# Patient Record
Sex: Female | Born: 1955 | Race: White | Hispanic: No | Smoking: Never smoker
Health system: Southern US, Community
[De-identification: ages and names within clinical notes are randomized; demographics above are authoritative.]

## PROBLEM LIST (undated history)

## (undated) DIAGNOSIS — R600 Localized edema: Secondary | ICD-10-CM

## (undated) DIAGNOSIS — T7840XA Allergy, unspecified, initial encounter: Secondary | ICD-10-CM

## (undated) DIAGNOSIS — R002 Palpitations: Secondary | ICD-10-CM

## (undated) DIAGNOSIS — R0602 Shortness of breath: Secondary | ICD-10-CM

## (undated) DIAGNOSIS — E785 Hyperlipidemia, unspecified: Secondary | ICD-10-CM

## (undated) DIAGNOSIS — E119 Type 2 diabetes mellitus without complications: Secondary | ICD-10-CM

## (undated) DIAGNOSIS — I10 Essential (primary) hypertension: Secondary | ICD-10-CM

## (undated) DIAGNOSIS — M549 Dorsalgia, unspecified: Secondary | ICD-10-CM

## (undated) DIAGNOSIS — N200 Calculus of kidney: Secondary | ICD-10-CM

## (undated) DIAGNOSIS — K219 Gastro-esophageal reflux disease without esophagitis: Secondary | ICD-10-CM

## (undated) DIAGNOSIS — J45909 Unspecified asthma, uncomplicated: Secondary | ICD-10-CM

## (undated) DIAGNOSIS — E559 Vitamin D deficiency, unspecified: Secondary | ICD-10-CM

## (undated) HISTORY — DX: Allergy, unspecified, initial encounter: T78.40XA

## (undated) HISTORY — DX: Shortness of breath: R06.02

## (undated) HISTORY — DX: Gastro-esophageal reflux disease without esophagitis: K21.9

## (undated) HISTORY — DX: Type 2 diabetes mellitus without complications: E11.9

## (undated) HISTORY — DX: Unspecified asthma, uncomplicated: J45.909

## (undated) HISTORY — DX: Dorsalgia, unspecified: M54.9

## (undated) HISTORY — DX: Calculus of kidney: N20.0

## (undated) HISTORY — PX: APPENDECTOMY: SHX54

## (undated) HISTORY — PX: COLONOSCOPY: SHX174

## (undated) HISTORY — DX: Hyperlipidemia, unspecified: E78.5

## (undated) HISTORY — PX: TONSILLECTOMY: SUR1361

## (undated) HISTORY — DX: Essential (primary) hypertension: I10

## (undated) HISTORY — DX: Localized edema: R60.0

## (undated) HISTORY — DX: Palpitations: R00.2

## (undated) HISTORY — PX: TUBAL LIGATION: SHX77

## (undated) HISTORY — DX: Vitamin D deficiency, unspecified: E55.9

---

## 1998-01-20 HISTORY — PX: AUGMENTATION MAMMAPLASTY: SUR837

## 1999-01-21 HISTORY — PX: BREAST ENHANCEMENT SURGERY: SHX7

## 2000-05-25 ENCOUNTER — Encounter: Admission: RE | Admit: 2000-05-25 | Discharge: 2000-05-25 | Payer: Self-pay | Admitting: Specialist

## 2000-05-25 ENCOUNTER — Encounter: Payer: Self-pay | Admitting: Specialist

## 2001-11-29 ENCOUNTER — Other Ambulatory Visit: Admission: RE | Admit: 2001-11-29 | Discharge: 2001-11-29 | Payer: Self-pay | Admitting: Family Medicine

## 2002-01-04 ENCOUNTER — Encounter: Admission: RE | Admit: 2002-01-04 | Discharge: 2002-01-04 | Payer: Self-pay | Admitting: Family Medicine

## 2002-01-04 ENCOUNTER — Encounter: Payer: Self-pay | Admitting: Family Medicine

## 2003-05-09 ENCOUNTER — Other Ambulatory Visit: Admission: RE | Admit: 2003-05-09 | Discharge: 2003-05-09 | Payer: Self-pay | Admitting: Family Medicine

## 2003-05-10 ENCOUNTER — Other Ambulatory Visit: Admission: RE | Admit: 2003-05-10 | Discharge: 2003-05-10 | Payer: Self-pay | Admitting: Family Medicine

## 2003-06-09 ENCOUNTER — Encounter: Admission: RE | Admit: 2003-06-09 | Discharge: 2003-06-09 | Payer: Self-pay | Admitting: Family Medicine

## 2003-08-16 ENCOUNTER — Other Ambulatory Visit: Admission: RE | Admit: 2003-08-16 | Discharge: 2003-08-16 | Payer: Self-pay | Admitting: Family Medicine

## 2004-05-13 ENCOUNTER — Ambulatory Visit: Payer: Self-pay | Admitting: Family Medicine

## 2004-05-13 ENCOUNTER — Other Ambulatory Visit: Admission: RE | Admit: 2004-05-13 | Discharge: 2004-05-13 | Payer: Self-pay | Admitting: Family Medicine

## 2004-05-30 ENCOUNTER — Ambulatory Visit: Payer: Self-pay

## 2004-05-31 ENCOUNTER — Encounter: Admission: RE | Admit: 2004-05-31 | Discharge: 2004-05-31 | Payer: Self-pay | Admitting: Family Medicine

## 2004-07-01 ENCOUNTER — Encounter: Admission: RE | Admit: 2004-07-01 | Discharge: 2004-07-01 | Payer: Self-pay | Admitting: Family Medicine

## 2004-08-20 HISTORY — PX: ABDOMINAL HYSTERECTOMY: SHX81

## 2004-09-09 ENCOUNTER — Observation Stay (HOSPITAL_COMMUNITY): Admission: RE | Admit: 2004-09-09 | Discharge: 2004-09-10 | Payer: Self-pay | Admitting: Obstetrics and Gynecology

## 2004-09-09 ENCOUNTER — Encounter (INDEPENDENT_AMBULATORY_CARE_PROVIDER_SITE_OTHER): Payer: Self-pay | Admitting: Specialist

## 2005-03-25 ENCOUNTER — Ambulatory Visit: Payer: Self-pay | Admitting: Internal Medicine

## 2005-04-01 ENCOUNTER — Ambulatory Visit: Payer: Self-pay | Admitting: Family Medicine

## 2005-05-15 ENCOUNTER — Ambulatory Visit: Payer: Self-pay | Admitting: Family Medicine

## 2005-07-21 ENCOUNTER — Encounter: Admission: RE | Admit: 2005-07-21 | Discharge: 2005-07-21 | Payer: Self-pay | Admitting: Family Medicine

## 2006-05-18 ENCOUNTER — Ambulatory Visit: Payer: Self-pay | Admitting: Family Medicine

## 2006-05-18 ENCOUNTER — Encounter: Payer: Self-pay | Admitting: Family Medicine

## 2006-05-18 ENCOUNTER — Other Ambulatory Visit: Admission: RE | Admit: 2006-05-18 | Discharge: 2006-05-18 | Payer: Self-pay | Admitting: Family Medicine

## 2006-05-18 DIAGNOSIS — L680 Hirsutism: Secondary | ICD-10-CM

## 2006-05-18 DIAGNOSIS — I10 Essential (primary) hypertension: Secondary | ICD-10-CM

## 2006-05-18 DIAGNOSIS — N812 Incomplete uterovaginal prolapse: Secondary | ICD-10-CM

## 2006-05-18 DIAGNOSIS — R519 Headache, unspecified: Secondary | ICD-10-CM | POA: Insufficient documentation

## 2006-05-18 DIAGNOSIS — E785 Hyperlipidemia, unspecified: Secondary | ICD-10-CM | POA: Insufficient documentation

## 2006-05-18 DIAGNOSIS — Z9889 Other specified postprocedural states: Secondary | ICD-10-CM

## 2006-05-18 DIAGNOSIS — R51 Headache: Secondary | ICD-10-CM

## 2006-05-18 HISTORY — DX: Other specified postprocedural states: Z98.890

## 2006-05-18 HISTORY — DX: Headache: R51

## 2006-05-18 HISTORY — DX: Hirsutism: L68.0

## 2006-05-18 HISTORY — DX: Incomplete uterovaginal prolapse: N81.2

## 2006-05-18 HISTORY — DX: Essential (primary) hypertension: I10

## 2006-05-18 LAB — CONVERTED CEMR LAB
AST: 22 units/L (ref 0–37)
Alkaline Phosphatase: 95 units/L (ref 39–117)
Bilirubin, Direct: 0.1 mg/dL (ref 0.0–0.3)
CO2: 30 meq/L (ref 19–32)
Calcium: 9.2 mg/dL (ref 8.4–10.5)
Chloride: 108 meq/L (ref 96–112)
Creatinine, Ser: 0.6 mg/dL (ref 0.4–1.2)
Eosinophils Relative: 3.7 % (ref 0.0–5.0)
GFR calc non Af Amer: 113 mL/min
HCT: 43.3 % (ref 36.0–46.0)
Hgb A1c MFr Bld: 5.8 % (ref 4.6–6.0)
Lymphocytes Relative: 27 % (ref 12.0–46.0)
MCHC: 34.3 g/dL (ref 30.0–36.0)
MCV: 89 fL (ref 78.0–100.0)
Monocytes Absolute: 0.5 10*3/uL (ref 0.2–0.7)
Neutrophils Relative %: 61.6 % (ref 43.0–77.0)
Platelets: 288 10*3/uL (ref 150–400)
Potassium: 4.3 meq/L (ref 3.5–5.1)
RBC: 4.86 M/uL (ref 3.87–5.11)
Total Bilirubin: 1.1 mg/dL (ref 0.3–1.2)
WBC: 7.2 10*3/uL (ref 4.5–10.5)

## 2006-08-19 ENCOUNTER — Encounter: Admission: RE | Admit: 2006-08-19 | Discharge: 2006-08-19 | Payer: Self-pay | Admitting: Family Medicine

## 2006-08-24 ENCOUNTER — Encounter (INDEPENDENT_AMBULATORY_CARE_PROVIDER_SITE_OTHER): Payer: Self-pay | Admitting: *Deleted

## 2006-12-05 ENCOUNTER — Ambulatory Visit: Payer: Self-pay | Admitting: Family Medicine

## 2006-12-05 DIAGNOSIS — J209 Acute bronchitis, unspecified: Secondary | ICD-10-CM | POA: Insufficient documentation

## 2007-01-21 HISTORY — PX: POLYPECTOMY: SHX149

## 2007-03-15 ENCOUNTER — Telehealth (INDEPENDENT_AMBULATORY_CARE_PROVIDER_SITE_OTHER): Payer: Self-pay | Admitting: *Deleted

## 2007-07-28 ENCOUNTER — Other Ambulatory Visit: Admission: RE | Admit: 2007-07-28 | Discharge: 2007-07-28 | Payer: Self-pay | Admitting: Family Medicine

## 2007-07-28 ENCOUNTER — Encounter: Payer: Self-pay | Admitting: Family Medicine

## 2007-07-28 ENCOUNTER — Ambulatory Visit: Payer: Self-pay | Admitting: Family Medicine

## 2007-07-28 DIAGNOSIS — Z978 Presence of other specified devices: Secondary | ICD-10-CM

## 2007-07-28 HISTORY — DX: Presence of other specified devices: Z97.8

## 2007-07-29 ENCOUNTER — Encounter (INDEPENDENT_AMBULATORY_CARE_PROVIDER_SITE_OTHER): Payer: Self-pay | Admitting: *Deleted

## 2007-08-03 ENCOUNTER — Encounter (INDEPENDENT_AMBULATORY_CARE_PROVIDER_SITE_OTHER): Payer: Self-pay | Admitting: *Deleted

## 2007-08-03 ENCOUNTER — Telehealth (INDEPENDENT_AMBULATORY_CARE_PROVIDER_SITE_OTHER): Payer: Self-pay | Admitting: *Deleted

## 2007-08-04 ENCOUNTER — Telehealth (INDEPENDENT_AMBULATORY_CARE_PROVIDER_SITE_OTHER): Payer: Self-pay | Admitting: *Deleted

## 2007-08-08 LAB — CONVERTED CEMR LAB
Albumin: 4 g/dL (ref 3.5–5.2)
BUN: 18 mg/dL (ref 6–23)
Basophils Relative: 0.1 % (ref 0.0–1.0)
Bilirubin, Direct: 0.1 mg/dL (ref 0.0–0.3)
CO2: 28 meq/L (ref 19–32)
CRP, High Sensitivity: 3 (ref 0.00–5.00)
Calcium: 9.4 mg/dL (ref 8.4–10.5)
Chloride: 106 meq/L (ref 96–112)
Cholesterol: 172 mg/dL (ref 0–200)
Creatinine, Ser: 0.7 mg/dL (ref 0.4–1.2)
GFR calc Af Amer: 114 mL/min
GFR calc non Af Amer: 94 mL/min
Lymphocytes Relative: 28.1 % (ref 12.0–46.0)
Platelets: 255 10*3/uL (ref 150–400)
RDW: 12.5 % (ref 11.5–14.6)
TSH: 1.22 microintl units/mL (ref 0.35–5.50)
Total Protein: 7 g/dL (ref 6.0–8.3)
Triglycerides: 144 mg/dL (ref 0–149)
WBC: 7.2 10*3/uL (ref 4.5–10.5)

## 2007-08-09 ENCOUNTER — Encounter (INDEPENDENT_AMBULATORY_CARE_PROVIDER_SITE_OTHER): Payer: Self-pay | Admitting: *Deleted

## 2007-08-12 ENCOUNTER — Ambulatory Visit: Payer: Self-pay | Admitting: Gastroenterology

## 2007-08-17 ENCOUNTER — Encounter: Payer: Self-pay | Admitting: Gastroenterology

## 2007-08-17 ENCOUNTER — Ambulatory Visit: Payer: Self-pay | Admitting: Gastroenterology

## 2007-08-19 ENCOUNTER — Encounter: Payer: Self-pay | Admitting: Gastroenterology

## 2007-08-23 ENCOUNTER — Encounter: Admission: RE | Admit: 2007-08-23 | Discharge: 2007-08-23 | Payer: Self-pay | Admitting: Family Medicine

## 2007-08-24 ENCOUNTER — Encounter (INDEPENDENT_AMBULATORY_CARE_PROVIDER_SITE_OTHER): Payer: Self-pay | Admitting: *Deleted

## 2007-10-12 ENCOUNTER — Telehealth (INDEPENDENT_AMBULATORY_CARE_PROVIDER_SITE_OTHER): Payer: Self-pay | Admitting: *Deleted

## 2008-01-06 ENCOUNTER — Ambulatory Visit: Payer: Self-pay | Admitting: Family Medicine

## 2008-02-04 ENCOUNTER — Telehealth (INDEPENDENT_AMBULATORY_CARE_PROVIDER_SITE_OTHER): Payer: Self-pay | Admitting: *Deleted

## 2008-08-28 ENCOUNTER — Encounter: Admission: RE | Admit: 2008-08-28 | Discharge: 2008-08-28 | Payer: Self-pay | Admitting: Family Medicine

## 2008-08-31 ENCOUNTER — Ambulatory Visit: Payer: Self-pay | Admitting: Family Medicine

## 2008-08-31 ENCOUNTER — Encounter: Payer: Self-pay | Admitting: Family Medicine

## 2008-08-31 ENCOUNTER — Other Ambulatory Visit: Admission: RE | Admit: 2008-08-31 | Discharge: 2008-08-31 | Payer: Self-pay | Admitting: Family Medicine

## 2008-09-01 ENCOUNTER — Telehealth (INDEPENDENT_AMBULATORY_CARE_PROVIDER_SITE_OTHER): Payer: Self-pay | Admitting: *Deleted

## 2008-09-06 ENCOUNTER — Encounter (INDEPENDENT_AMBULATORY_CARE_PROVIDER_SITE_OTHER): Payer: Self-pay | Admitting: *Deleted

## 2008-09-07 ENCOUNTER — Encounter (INDEPENDENT_AMBULATORY_CARE_PROVIDER_SITE_OTHER): Payer: Self-pay | Admitting: *Deleted

## 2009-02-12 ENCOUNTER — Telehealth (INDEPENDENT_AMBULATORY_CARE_PROVIDER_SITE_OTHER): Payer: Self-pay | Admitting: *Deleted

## 2009-08-29 ENCOUNTER — Encounter: Admission: RE | Admit: 2009-08-29 | Discharge: 2009-08-29 | Payer: Self-pay | Admitting: Family Medicine

## 2009-09-14 ENCOUNTER — Ambulatory Visit: Payer: Self-pay | Admitting: Family Medicine

## 2009-09-14 DIAGNOSIS — Z780 Asymptomatic menopausal state: Secondary | ICD-10-CM

## 2009-09-14 DIAGNOSIS — K219 Gastro-esophageal reflux disease without esophagitis: Secondary | ICD-10-CM

## 2009-09-14 HISTORY — DX: Gastro-esophageal reflux disease without esophagitis: K21.9

## 2009-09-14 HISTORY — DX: Asymptomatic menopausal state: Z78.0

## 2009-09-17 ENCOUNTER — Encounter (INDEPENDENT_AMBULATORY_CARE_PROVIDER_SITE_OTHER): Payer: Self-pay | Admitting: *Deleted

## 2009-10-05 ENCOUNTER — Ambulatory Visit: Payer: Self-pay | Admitting: Family Medicine

## 2009-10-05 DIAGNOSIS — L919 Hypertrophic disorder of the skin, unspecified: Secondary | ICD-10-CM

## 2009-10-05 DIAGNOSIS — L909 Atrophic disorder of skin, unspecified: Secondary | ICD-10-CM | POA: Insufficient documentation

## 2009-10-05 HISTORY — DX: Atrophic disorder of skin, unspecified: L90.9

## 2009-10-12 ENCOUNTER — Telehealth (INDEPENDENT_AMBULATORY_CARE_PROVIDER_SITE_OTHER): Payer: Self-pay | Admitting: *Deleted

## 2009-10-25 ENCOUNTER — Encounter: Payer: Self-pay | Admitting: Gastroenterology

## 2009-10-30 ENCOUNTER — Ambulatory Visit: Payer: Self-pay | Admitting: Gastroenterology

## 2009-11-07 ENCOUNTER — Ambulatory Visit: Payer: Self-pay | Admitting: Gastroenterology

## 2009-11-09 ENCOUNTER — Encounter: Payer: Self-pay | Admitting: Gastroenterology

## 2009-11-09 DIAGNOSIS — K222 Esophageal obstruction: Secondary | ICD-10-CM

## 2009-11-09 HISTORY — DX: Esophageal obstruction: K22.2

## 2009-11-12 ENCOUNTER — Encounter: Payer: Self-pay | Admitting: Gastroenterology

## 2010-02-17 LAB — CONVERTED CEMR LAB
ALT: 24 units/L (ref 0–35)
ALT: 39 units/L — ABNORMAL HIGH (ref 0–35)
AST: 19 units/L (ref 0–37)
Albumin: 4.3 g/dL (ref 3.5–5.2)
Alkaline Phosphatase: 105 units/L (ref 39–117)
Alkaline Phosphatase: 85 units/L (ref 39–117)
BUN: 14 mg/dL (ref 6–23)
Basophils Absolute: 0 10*3/uL (ref 0.0–0.1)
Basophils Relative: 0.4 % (ref 0.0–3.0)
Bilirubin, Direct: 0.1 mg/dL (ref 0.0–0.3)
CO2: 29 meq/L (ref 19–32)
CO2: 31 meq/L (ref 19–32)
Cholesterol: 145 mg/dL (ref 0–200)
Creatinine, Ser: 0.7 mg/dL (ref 0.4–1.2)
Creatinine, Ser: 0.8 mg/dL (ref 0.4–1.2)
Direct LDL: 144.5 mg/dL
Eosinophils Absolute: 0.2 10*3/uL (ref 0.0–0.7)
Eosinophils Relative: 1.6 % (ref 0.0–5.0)
GFR calc non Af Amer: 96.51 mL/min (ref >60)
Glucose, Urine, Semiquant: NEGATIVE
H Pylori IgG: NEGATIVE
HDL: 45.6 mg/dL (ref >39.00)
Hemoglobin: 14.7 g/dL (ref 12.0–15.0)
Hemoglobin: 14.9 g/dL (ref 12.0–15.0)
Hgb A1c MFr Bld: 6.6 % — ABNORMAL HIGH (ref 4.6–6.5)
Lymphocytes Relative: 20.1 % (ref 12.0–46.0)
Lymphs Abs: 2.1 10*3/uL (ref 0.7–4.0)
MCV: 91.5 fL (ref 78.0–100.0)
Monocytes Absolute: 0.5 10*3/uL (ref 0.1–1.0)
Neutrophils Relative %: 72 % (ref 43.0–77.0)
Nitrite: NEGATIVE
Platelets: 283 10*3/uL (ref 150.0–400.0)
Potassium: 4.8 meq/L (ref 3.5–5.1)
RBC: 4.76 M/uL (ref 3.87–5.11)
RDW: 13.1 % (ref 11.5–14.6)
Sodium: 144 meq/L (ref 135–145)
Specific Gravity, Urine: 1.01
TSH: 1.12 microintl units/mL (ref 0.35–5.50)
Total Bilirubin: 0.9 mg/dL (ref 0.3–1.2)
Total CHOL/HDL Ratio: 3
Triglycerides: 160 mg/dL — ABNORMAL HIGH (ref 0.0–149.0)
VLDL: 32 mg/dL (ref 0.0–40.0)
Vit D, 25-Hydroxy: 41 ng/mL (ref 30–89)
WBC Urine, dipstick: NEGATIVE
WBC: 7.1 10*3/uL (ref 4.5–10.5)

## 2010-02-19 NOTE — Assessment & Plan Note (Signed)
Summary: cpx & lab/cbs   Vital Signs:  Patient profile:   55 year old female Menstrual status:  hysterectomy Height:      60.75 inches Weight:      161 pounds BMI:     30.78 Temp:     98.4 degrees F oral Pulse rate:   62 / minute Pulse rhythm:   regular BP sitting:   110 / 70  (right arm)  Vitals Entered By: Almeta Monas CMA Duncan Dull) (September 14, 2009 9:11 AM) CC: CPX/ Fasting.      Menstrual Status hysterectomy Last PAP Result NEGATIVE FOR INTRAEPITHELIAL LESIONS OR MALIGNANCY.   History of Present Illness: Pt here for cpe and labs. Pt c/o vaginal dryness and oral dryness. No other complaints.  Preventive Screening-Counseling & Management  Alcohol-Tobacco     Alcohol drinks/day: <1     Alcohol type: 1 glass wine or beer or martini     Smoking Status: never     Year Quit: 1983     Pack years:  1 pack a week     Passive Smoke Exposure: yes  Caffeine-Diet-Exercise     Caffeine use/day: 1     Diet Comments: eating healthy     Does Patient Exercise: yes     Type of exercise: walking     Exercise (avg: min/session): 30-60     Times/week: 3     Exercise Counseling: to improve exercise regimen  Hep-HIV-STD-Contraception     HIV Risk: no     Dental Visit-last 6 months yes     Dental Care Counseling: not indicated; dental care within six months     SBE monthly: yes     SBE Education/Counseling: not indicated; SBE done regularly     Sun Exposure-Excessive: no  Safety-Violence-Falls     Seat Belt Use: yes      Sexual History:  separated.    Current Medications (verified): 1)  Lipitor 40 Mg  Tabs (Atorvastatin Calcium) .... Take One Tablet Every Evening At Bedtime. 2)  Atenolol 25 Mg  Tabs (Atenolol) .... Take One Tablet Once Daily. 3)  Singulair 10 Mg  Tabs (Montelukast Sodium) .... Take One Tablet Daily. 4)  Adult Aspirin Ec Low Strength 81 Mg  Tbec (Aspirin) .... Take One Tablet Daily. 5)  Combivent 103-18 Mcg/act  Aero (Ipratropium-Albuterol) .... 2 Puffs As  Needed. 6)  Nasonex 50 Mcg/act  Susp (Mometasone Furoate) .... Iinstill One Spray in Each Nostril Daily As Needed. 7)  Nexium 40 Mg  Cpdr (Esomeprazole Magnesium) .[name].. 1 By Mouth Once Daily 8)  Estroven  Tabs (Nutritional Supplements) .[name].. 1 By Mouth At Bedtime  Allergies (verified): 1)  ! * Iv Dye 2)  ! 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) LAPAROSCOPY, HX OF (ICD-V15.2) HIRSUTISM (ICD-704.1) IMPAIRED FASTING GLUCOSE (ICD-790.21) HYPERTENSION (ICD-401.9) HYPERLIPIDEMIA (ICD-272.4) HEADACHE (ICD-784.0)  Family History: Last updated: 05/18/2006 Family History of CAD Female 1st degree relative <50 Family History of Cervical cancer Family History High cholesterol Family History Hypertension MGM-  Breast, colon, cervical CA ([place]Reast primary)  Family History of Asthma M-RHeumatic Heart fever Family History Diabetes 1st degree relative, father  Social History: Last updated: 09/14/2009 Occupation: Nurse  Consultant for [employer] Married--separated Alcohol use-yes Drug use-no Regular exercise-yes Never Smoked  Risk Factors: Alcohol Use: <1 (09/14/2009) Caffeine Use: 1 (09/14/2009) Diet: eating healthy (09/14/2009) Exercise: yes (09/14/2009)  Risk Factors: Smoking Status: never (09/14/2009) Passive Smoke Exposure: yes (09/14/2009)  Past Surgical History: Tubal ligation Tonsillectomy Appendectomy Hysterectomy, aug 2006--- fibroids breast implants 2001  Family History: Reviewed history from 05/18/2006 and no  changes required. Family History of CAD Female 1st degree relative <50 Family History of Cervical cancer Family History High cholesterol Family History Hypertension MGM-  Breast, colon, cervical CA (BReast primary)  Family History of Asthma M-RHeumatic Heart fever Family History Diabetes 1st degree relative, father  Social History: Reviewed history from 07/28/2007 and no changes required. Occupation: Engineer, site for [employer] Married--separated Alcohol use-yes Drug use-no Regular exercise-yes Never Smoked Dental Care w/in 6 mos.:  yes Sexual History:  separated Does Patient Exercise:  yes  Review of Systems      See HPI General:  Denies chills, fatigue, fever, loss of appetite, malaise, sleep disorder, sweats, weakness, and weight loss. Eyes:  Denies blurring, discharge, double vision, eye irritation, eye pain, halos, itching, light sensitivity, red eye, vision loss-1 eye, and vision loss-both eyes; optho q1y. ENT:  Denies decreased hearing, difficulty swallowing, ear discharge, earache, hoarseness, nasal congestion, nosebleeds, postnasal drainage, ringing in ears, sinus pressure, and sore throat. CV:  Denies bluish discoloration of lips or nails, chest pain or discomfort, difficulty breathing at night, difficulty breathing while lying down, fainting, fatigue, leg cramps with exertion, lightheadness, near fainting, palpitations, shortness of breath with exertion, swelling of feet, swelling of hands, and weight gain. Resp:  Denies chest discomfort, chest pain with inspiration, cough, coughing up blood, excessive snoring, hypersomnolence, morning headaches, pleuritic, shortness of breath, sputum productive, and wheezing. GI:  Denies abdominal pain, bloody stools, change in bowel habits, constipation, dark tarry stools, diarrhea, excessive appetite, gas, hemorrhoids, indigestion, loss of appetite, nausea, vomiting, vomiting blood, and yellowish skin color. GU:  Denies abnormal  vaginal bleeding, decreased libido, discharge, dysuria, genital sores, hematuria, incontinence, nocturia, urinary frequency, and urinary hesitancy. MS:  Denies joint pain, joint redness, joint swelling, loss of strength, low back pain, mid back pain, muscle aches, muscle , cramps, muscle weakness, stiffness, and thoracic pain. Derm:  Denies changes in color of skin, changes in nail beds, dryness, excessive perspiration, flushing, hair loss, insect bite(s), itching, lesion(s), poor wound healing, and rash; skin tags. Neuro:  Denies brief paralysis, difficulty with concentration, disturbances in coordination, falling down, headaches, inability to speak, memory loss, numbness, poor balance, seizures, sensation of room spinning, tingling, tremors, visual disturbances, and weakness. Psych:  Denies alternate hallucination ( auditory/visual), anxiety, depression, easily angered, easily tearful, irritability, mental problems, panic attacks, sense of great danger, suicidal thoughts/plans, thoughts of violence, unusual visions or sounds, and thoughts /plans of harming others. Endo:  Denies cold intolerance, excessive hunger, excessive thirst, excessive urination, heat intolerance, polyuria, and weight change. Heme:  Denies abnormal bruising, bleeding, enlarge lymph nodes, fevers, pallor, and skin discoloration. Allergy:  Denies hives or rash, itching eyes, persistent infections, seasonal allergies, and sneezing.  Physical Exam  General:  Well-developed,well-nourished,in no acute distress; alert,appropriate and cooperative throughout examination Head:  Normocephalic and atraumatic without obvious abnormalities. No apparent alopecia or balding. Eyes:  vision grossly intact, pupils equal, pupils round, pupils reactive to light, and no injection.   Ears:  External ear exam shows no significant lesions or deformities.  Otoscopic examination reveals clear canals, tympanic membranes are intact bilaterally  without  bulging, retraction, inflammation or discharge. Hearing is grossly normal bilaterally. Nose:  External nasal examination shows no deformity or inflammation. Nasal mucosa are pink and moist without lesions or exudates. Mouth:  Oral mucosa and oropharynx without lesions or exudates.  Teeth in good repair. Neck:  No deformities, masses, or tenderness noted. Chest Wall:  No deformities, masses, or tenderness noted. Breasts:  No mass, nodules, thickening, tenderness, bulging, retraction, inflamation, nipple discharge or skin changes noted.  +implants Lungs:  Normal respiratory effort, chest expands symmetrically. Lungs are clear to auscultation, no crackles or wheezes. Heart:  Normal rate and regular rhythm. S1 and S2 normal without gallop, murmur, click, rub or other extra sounds. Abdomen:  Bowel sounds positive,abdomen soft and non-tender without masses, organomegaly or hernias noted. Msk:  normal ROM, no joint tenderness, no joint swelling, no joint warmth, no redness over joints, no joint deformities, no joint instability, and no crepitation.   Pulses:  R posterior tibial normal, R dorsalis pedis normal, R carotid normal, L posterior tibial normal, L dorsalis pedis normal, and L carotid normal.   Extremities:  No clubbing, cyanosis, edema, or deformity noted with normal full range of motion of all joints.   Neurologic:  No cranial nerve deficits noted. Station and gait are normal. Plantar reflexes are down-going bilaterally. DTRs are symmetrical throughout. Sensory, motor and coordinative functions appear intact. Skin:  Intact without suspicious lesions or rashes Cervical Nodes:  No lymphadenopathy noted Axillary Nodes:  No palpable lymphadenopathy Psych:  Cognition and judgment appear intact. Alert and cooperative with normal attention span and concentration. No apparent delusions, illusions, hallucinations   Impression & Recommendations:  Problem # 1:  PREVENTIVE HEALTH CARE  (ICD-V70.0)  check fasting labs ghm utd  Orders: EKG w/ Interpretation (93000)  Problem # 2:  GERD (ICD-530.81) Assessment: Improved  Her updated medication list for this problem includes:    Dexilant 60 Mg Cpdr (Dexlansoprazole) .[name]... 1 by mouth once daily  Orders: Gastroenterology Referral (GI) TLB-H. Pylori Abs(Helicobacter Pylori) (86677-HELICO) EKG w/ Interpretation (93000)  Problem # 3:  POSTMENOPAUSAL STATUS (ICD-V49.81)  Orders: T-Vitamin D (25-Hydroxy) (06301-60109) EKG w/ Interpretation (93000)  Problem # 4:  IMPAIRED FASTING GLUCOSE (ICD-790.21)  Orders: Venipuncture (32355) TLB-Lipid Panel (80061-LIPID) TLB-BMP (Basic Metabolic Panel-BMET) (80048-METABOL) TLB-CBC Platelet - w/Differential (85025-CBCD) TLB-Hepatic/Liver Function Pnl (80076-HEPATIC) TLB-TSH (Thyroid Stimulating Hormone) (84443-TSH) TLB-A1C / Hgb A1C (Glycohemoglobin) (83036-A1C) Specimen Handling (73220)  Labs Reviewed: Creat: 0.8 (08/31/2008)     Problem # 5:  HYPERTENSION (ICD-401.9)  Her updated medication list for this problem includes:    Atenolol 25 Mg Tabs (Atenolol) .[name]... Take one tablet once daily.  Orders: Venipuncture (25427) TLB-Lipid Panel (80061-LIPID) TLB-BMP (Basic Metabolic Panel-BMET) (80048-METABOL) TLB-CBC Platelet - w/Differential (85025-CBCD) TLB-Hepatic/Liver Function Pnl (80076-HEPATIC) TLB-TSH (Thyroid Stimulating Hormone) (84443-TSH) TLB-A1C / Hgb A1C (Glycohemoglobin) (83036-A1C) Specimen Handling (06237)  BP today: 110/70 Prior BP: 130/80 (08/31/2008)  Labs Reviewed: K+: 4.8 (08/31/2008) Creat: : 0.8 (08/31/2008)   Chol: 218 (08/31/2008)   HDL: 45.20 (08/31/2008)   LDL: 102 (07/28/2007)   TG: 160.0 (08/31/2008)  Problem # 6:  HYPERLIPIDEMIA (ICD-272.4)  The following medications were removed from the medication list:    Antara 130 Mg Caps (Fenofibrate micronized) .[name]... 1 by mouth once daily. needs labwork. Her updated medication list for this  problem includes:    Lipitor 40 Mg Tabs (Atorvastatin calcium) .[name]... Take one tablet every evening at bedtime.  Orders: Venipuncture (62831) TLB-Lipid Panel (80061-LIPID) TLB-BMP (Basic Metabolic Panel-BMET) (80048-METABOL) TLB-CBC Platelet - w/Differential (  85025-CBCD) TLB-Hepatic/Liver Function Pnl (80076-HEPATIC) TLB-TSH (Thyroid Stimulating Hormone) (84443-TSH) TLB-A1C / Hgb A1C (Glycohemoglobin) (83036-A1C) Specimen Handling (54098)  Labs Reviewed: SGOT: 30 (08/31/2008)   SGPT: 39 (08/31/2008)   HDL:45.20 (08/31/2008), 41.6 (07/28/2007)  LDL:102 (07/28/2007)  Chol:218 (08/31/2008), 172 (07/28/2007)  Trig:160.0 (08/31/2008), 144 (07/28/2007)  Complete Medication List: 1)  Lipitor 40 Mg Tabs (Atorvastatin calcium) .... Take one tablet every evening at bedtime. 2)  Atenolol 25 Mg Tabs (Atenolol) .... Take one tablet once daily. 3)  Singulair 10 Mg Tabs (Montelukast sodium) .... Take one tablet daily. 4)  Adult Aspirin Ec Low Strength 81 Mg Tbec (Aspirin) .... Take one tablet daily. 5)  Combivent 103-18 Mcg/act Aero (Ipratropium-albuterol) .... 2 puffs as needed. 6)  Nasonex 50 Mcg/act Susp (Mometasone furoate) .... Iinstill one spray in each nostril daily as needed. 7)  Dexilant 60 Mg Cpdr (Dexlansoprazole) .[name].. 1 by mouth once daily 8)  Estroven Tabs (Nutritional supplements) .[name].. 1 by mouth at bedtime  Patient Instructions: 1)  schedule skin tag removal  Prescriptions: DEXILANT 60 MG CPDR (DEXLANSOPRAZOLE) 1 by mouth once daily  #30 x 0   Entered and Authorized by:   Loreen Freud DO   Signed by:   Loreen Freud DO on 09/14/2009   Method used:   Historical   RxID:   [id number] LIPITOR 40 MG  TABS (ATORVASTATIN CALCIUM) Take one tablet every evening at bedtime.  #30 x 5   Entered and Authorized by:   Loreen Freud DO   Signed by:   Loreen Freud DO on 09/14/2009   Method used:   Print then Mail to Patient   RxID:   [id number] NASONEX 50 MCG/ACT  SUSP (MOMETASONE  FUROATE) IiNSTILL ONE SPRAY IN EACH NOSTRIL DAILY AS NEEDED.  #3 x 3   Entered and Authorized by:   Loreen Freud DO   Signed by:   Loreen Freud DO on 09/14/2009   Method used:   Faxed to ...       [facility] (mail-order)             , [place]         Ph: [phone]       Fax: [phone]   RxID:   [id number] COMBIVENT 103-18 MCG/ACT  AERO (IPRATROPIUM-ALBUTEROL) 2 PUFFS AS NEEDED.  #3 x 3   Entered and Authorized by:   Loreen Freud DO   Signed by:   Loreen Freud DO on 09/14/2009   Method used:   Faxed to ...       [facility] (mail-order)             , [place]         Ph: [phone]       Fax: [phone]   RxID:   [id number] SINGULAIR 10 MG  TABS (MONTELUKAST SODIUM) tAKE ONE TABLET DAILY.  #90 x 3   Entered and Authorized by:   Loreen Freud DO   Signed by:   Loreen Freud DO on 09/14/2009   Method used:   Faxed to ...       [facility] (mail-order)             , [place]         Ph: [phone]       Fax: [phone]   RxID:   [id number] ATENOLOL 25 MG  TABS (ATENOLOL) tAKE ONE TABLET ONCE DAILY.  #90 x 3   Entered and Authorized by:   Loreen Freud DO   Signed by:   Myrene Buddy  Lowne DO on 09/14/2009   Method used:   Faxed to ...       [facility] (mail-order)             , [place]         Ph: [phone]       Fax: [phone]   RxID:   [id number]    EKG  Procedure date:  09/14/2009  Findings:      Normal sinus rhythm with rate of:  60   Last Flu Vaccine:  Fluvax 3+ (11/03/2006 8:41:26 AM) Flu Vaccine Result Date:  11/01/2008 Flu Vaccine Result:  given Flu Vaccine Next Due:  1 yr Last Colonoscopy:  Location:  [facility].   (08/17/2007 8:50:26 AM) Colonoscopy Result Date:  09/17/2006 Colonoscopy Result:  polyps Colonoscopy Next Due:  5 yr Last Mammogram:  ASSESSMENT: Negative - BI-RADS 1^MM DIGITAL SCREENING W/ IMPLANTS (08/29/2009 7:32:00 AM) Mammogram Result Date:  08/29/2009 Mammogram Result:  normal Mammogram Next Due:  1 yr

## 2010-02-19 NOTE — Miscellaneous (Signed)
Summary: Consent to Special Procedure  Consent to Special Procedure   Imported By: Lanelle Bal 10/15/2009 10:20:53  _____________________________________________________________________  External Attachment:    Type:   Image     Comment:   External Document

## 2010-02-19 NOTE — Assessment & Plan Note (Signed)
Summary: skin tag removal/cbs   Vital Signs:  Patient profile:   55 year old female Menstrual status:  hysterectomy Weight:      155.8 pounds Temp:     98.7 degrees F oral Pulse rate:   64 / minute Pulse rhythm:   regular BP sitting:   130 / 82  (left arm)  Vitals Entered By: Almeta Monas CMA Duncan Dull) (October 05, 2009 10:29 AM) CC: SKIN TAG REMOVAL   History of Present Illness: Pt here for skin tag removal.    Current Medications (verified): 1)  Lipitor 40 Mg  Tabs (Atorvastatin Calcium) .... Take One Tablet Every Evening At Bedtime. 2)  Atenolol 25 Mg  Tabs (Atenolol) .... Take One Tablet Once Daily. 3)  Singulair 10 Mg  Tabs (Montelukast Sodium) .... Take One Tablet Daily. 4)  Adult Aspirin Ec Low Strength 81 Mg  Tbec (Aspirin) .... Take One Tablet Daily. 5)  Combivent 103-18 Mcg/act  Aero (Ipratropium-Albuterol) .... 2 Puffs As Needed. 6)  Nasonex 50 Mcg/act  Susp (Mometasone Furoate) .... Iinstill One Spray in Each Nostril Daily As Needed. 7)  Dexilant 60 Mg Cpdr (Dexlansoprazole) .[name].. 1 By Mouth Once Daily 8)  Estroven  Tabs (Nutritional Supplements) .[name].. 1 By Mouth At Bedtime  Allergies (verified): 1)  ! * Iv Dye 2)  ! * Metamucil   Complete Medication List: 1)  Lipitor 40 Mg Tabs (Atorvastatin calcium) .... Take one tablet every evening at bedtime. 2)  Atenolol 25 Mg Tabs (Atenolol) .... Take one tablet once daily. 3)  Singulair 10 Mg Tabs (Montelukast sodium) .... Take one tablet daily. 4)  Adult Aspirin Ec Low Strength 81 Mg Tbec (Aspirin) .... Take one tablet daily. 5)  Combivent 103-18 Mcg/act Aero (Ipratropium-albuterol) .... 2 puffs as needed. 6)  Nasonex 50 Mcg/act Susp (Mometasone furoate) .... Iinstill one spray in each nostril daily as needed. 7)  Dexilant 60 Mg Cpdr (Dexlansoprazole) .[name].. 1 by mouth once daily 8)  Estroven Tabs (Nutritional supplements) .[name].. 1 by mouth at bedtime  Other Orders: Removal of Skin Tags up to 15 Lesions (11200) Zoster  (Shingles) Vaccine Live (16109) Admin 1st Vaccine (60454)  Procedure Note Last Tetanus: Td (11/29/2001)  Skin Tag Removal: The patient complains of inflammation and suspicious lesion. Onset of lesion: > 3 months Indication: inflamed lesion Consent signed: yes  Procedure # 1: skin tag(s) removed    Region: lateral    Location: R chest    # lesions removed: 1    Instrument used: scissors    Anesthesia: 1% lidocaine w/epinephrine  Procedure # 2: skin tag(s) removed    Region: anterior    Location: neck-left    # lesions removed: 5    Instrument used: scissors    Anesthesia: 1% lidocaine w/epinephrine  Procedure # 3: skin tag(s) removed    Region: anterior    Location: neck-right    # lesions removed: 2    Instrument used: scissors    Anesthesia: 1% lidocaine w/epinephrine    Comment: keep dry 24 hours  Cleaned and prepped with: alcohol and betadine Wound dressing: neosporin and bandaid Instructions: daily dressing changes    Immunizations Administered:  Zostavax # 1:    Vaccine Type: Zostavax    Site: left deltoid    Mfr: Merck    Dose: 0.5 ml    Route: Datil    Given by: Almeta Monas CMA (AAMA)    Exp. Date: 09/07/2010    Lot #: [id number]    VIS given:  11/01/04 given October 05, 2009.

## 2010-02-19 NOTE — Progress Notes (Signed)
Summary: RX  Phone Note Refill Request   Refills Requested: Medication #1:  ANTARA 130 MG CAPS 1 by mouth once daily. [facility]--[phone]  Initial call taken by: Freddy Jaksch,  February 12, 2009 9:04 AM    New/Updated Medications: ANTARA 130 MG CAPS (FENOFIBRATE MICRONIZED) 1 by mouth once daily. NEEDS LABWORK. Prescriptions: ANTARA 130 MG CAPS (FENOFIBRATE MICRONIZED) 1 by mouth once daily. NEEDS LABWORK.  #90 x 0   Entered by:   Army Fossa CMA   Authorized by:   Loreen Freud DO   Signed by:   Army Fossa CMA on 02/12/2009   Method used:   Electronically to        [facility] (mail-order)             ,          Ph: [phone]       Fax: [phone]   RxID:   [id number]

## 2010-02-19 NOTE — Letter (Signed)
Summary: Results Letter   Gastroenterology  [address] [place], [place] [address]   Phone: [phone]  Fax: [phone]        November 12, 2009 MRN: [id number]    [facility] [address] [place], [place]  [address]    Dear Ms. PRESTON,   Your biopsies demonstrated inflammatory changes only.    Please follow the recommendations previously discussed.  Should you have any immediate concerns or questions, feel free to contact me at the office.    Sincerely,  Barbette Hair. Arlyce Dice, M.D., [facility]          Sincerely,  Louis Meckel MD  This letter has been electronically signed by your physician.  Appended Document: Results Letter letter mailed

## 2010-02-19 NOTE — Miscellaneous (Signed)
  Clinical Lists Changes  Problems: Added new problem of ESOPHAGEAL STRICTURE (ICD-530.3)

## 2010-02-19 NOTE — Letter (Signed)
Summary: New Patient letter  [facility]astroenterology  [address] [place], [place] [address]   Phone: [phone]  Fax: [phone]       10/25/2009 MRN: [id number]  [facility] [address] [place], [place]  [address]  Dear [name],  Welcome to the Gastroenterology Division at [facility].    You are scheduled to see Dr.  Arlyce Dice on 10-30-09 at 2pm on the 3rd floor at [facility], [address].  We ask that you try to arrive at our office 15 minutes prior to your appointment time to allow for check-in.  We would like you to complete the enclosed self-administered evaluation form prior to your visit and bring it with you on the day of your appointment.  We will review it with you.  Also, please bring a complete list of all your medications or, if you prefer, bring the medication bottles and we will list them.  Please bring your insurance card so that we may make a copy of it.  If your insurance requires a referral to see a specialist, please bring your referral form from your primary care physician.  Co-payments are due at the time of your visit and may be paid by cash, check or credit card.     Your office visit will consist of a consult with your physician (includes a physical exam), any laboratory testing he/she may order, scheduling of any necessary diagnostic testing (e.g. x-ray, ultrasound, CT-scan), and scheduling of a procedure (e.g. Endoscopy, Colonoscopy) if required.  Please allow enough time on your schedule to allow for any/all of these possibilities.    If you cannot keep your appointment, please call [phone] to cancel or reschedule prior to your appointment date.  This allows [place] the opportunity to schedule an appointment for another patient in need of care.  If you do not cancel or reschedule by 5 p.m. the business day prior to your appointment date, you will be charged a $50.00 late cancellation/no-show fee.    Thank you for choosing  [facility] for your medical needs.  We appreciate the opportunity to care for you.  Please visit [place] at our website  to learn more about our practice.                     Sincerely,                                                             The Gastroenterology Division

## 2010-02-19 NOTE — Assessment & Plan Note (Signed)
Summary: WORSENING GERD/YF   History of Present Illness Visit Type: Initial Consult Primary GI MD: Melvia Heaps MD [facility] Primary Provider: Loreen Freud, DO  Chief Complaint: Patient c/o worsening reflux. She has been on Nexium which initially helped but then stopped helping. She has also tried Dexilant without improvement. She has had some new abdominal bloating and occasional dysphagia to fluids. History of Present Illness:   [name] is a 55 year old white female referred at the request of Dr. Laury Axon for evaluation of reflux.  She has had reflux for years and has taken various PPIs.  Over the last few months symptoms have worsened, characterized by frequent nocturnal pyrosis and regurgitation of gastric contents with burning.  She has had some dysphagia to liquids.  Symptoms did seem to improve with AcipHex though this was not allowed by her insurance.  She has been on the other PPIs.  She is on no gastric irritants.   GI Review of Systems    Reports acid reflux, bloating, dysphagia with liquids, and  weight loss.      Denies abdominal pain, belching, chest pain, dysphagia with solids, heartburn, loss of appetite, nausea, vomiting, vomiting blood, and  weight gain.      Reports hemorrhoids.     Denies anal fissure, black tarry stools, change in bowel habit, constipation, diarrhea, diverticulosis, fecal incontinence, heme positive stool, irritable bowel syndrome, jaundice, light color stool, liver problems, rectal bleeding, and  rectal pain.    Current Medications (verified): 1)  Lipitor 40 Mg  Tabs (Atorvastatin Calcium) .... Take One Tablet Every Evening At Bedtime. 2)  Atenolol 25 Mg  Tabs (Atenolol) .... Take One Tablet Once Daily. 3)  Singulair 10 Mg  Tabs (Montelukast Sodium) .... Take One Tablet Daily. 4)  Adult Aspirin Ec Low Strength 81 Mg  Tbec (Aspirin) .... Take One Tablet Daily. 5)  Combivent 103-18 Mcg/act  Aero (Ipratropium-Albuterol) .... 2 Puffs As Needed. 6)  Nasonex 50  Mcg/act  Susp (Mometasone Furoate) .... Iinstill One Spray in Each Nostril Daily As Needed. 7)  Dexilant 60 Mg Cpdr (Dexlansoprazole) .[name].. 1 By Mouth Once Daily 8)  Estroven  Tabs (Nutritional Supplements) .[name].. 1 By Mouth At Bedtime 9)  Flaxseed Oil 1200 Mg Caps (Flaxseed (Linseed)) .... Take 1 Capsule By Mouth At Bedtime 10)  Fish Oil 1000 Mg Caps (Omega-3 Fatty Acids) .... Take 1 Capsule By Mouth At Bedtime 11)  Vitamin E 200 Unit Caps (Vitamin E) .... Take 1 Capsule By Mouth Once Daily  Allergies (verified): 1)  ! * Iv Dye 2)  ! * Metamucil  Past History:  Past Medical History: Reviewed history from 10/25/2009 and no changes required. 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 Tubal ligation Tonsillectomy Appendectomy Hysterectomy, aug 2006--- fibroids breast implants 2001  Family History: Family History of CAD Female 1st degree relative <50 Family History of Cervical cancer Family History High cholesterol Family History Hypertension MGM-  Breast, colon, cervical, uterine CA (Breast primary)  Family History of Asthma M-RHeumatic Heart fever Family History Diabetes 1st degree relative, father Family History  of Colon Polyps: Father Family History of Irritable Bowel Syndrome: Mother Family History of Kidney Disease: Father  Social  History: Reviewed history from 09/14/2009 and no changes required. Occupation: Engineer, site for [employer] Married--separated Alcohol use-yes-<1 glass daily Drug use-no Regular exercise-yes Never Smoked  Review of Systems       The patient complains of back pain, fatigue, night sweats, shortness of breath, and sleeping problems.  The patient denies allergy/sinus, anemia, anxiety-new, arthritis/joint pain, blood in urine, breast changes/lumps, change in vision, confusion, cough, coughing up blood, depression-new, fainting, fever, headaches-new, hearing problems, heart murmur, heart rhythm changes, itching, menstrual pain, muscle pains/cramps, nosebleeds, pregnancy symptoms, skin rash, sore throat, swelling of feet/legs, swollen lymph glands, thirst - excessive , urination - excessive , urination changes/pain, urine leakage, vision changes, and voice change.         All other systems were reviewed and were negative   Vital Signs:  Patient profile:   55 year old female Menstrual status:  hysterectomy Height:      60.75 inches Weight:      153.13 pounds BMI:     29.28 BSA:     1.68 Pulse rate:   68 / minute Pulse rhythm:   regular BP sitting:   114 / 70  (right arm)  Vitals Entered By: Lamona Curl CMA Duncan Dull) (October 30, 2009 2:08 PM)  Physical Exam  Additional Exam:  On physical exam she is a well-developed well-nourished female  skin: anicteric HEENT: normocephalic; PEERLA; no nasal or pharyngeal abnormalities neck: supple nodes: no cervical lymphadenopathy chest: clear to ausculatation and percussion heart: no murmurs, gallops, or rubs abd: soft, nontender; BS normoactive; no abdominal masses, tenderness, organomegaly; there is no succussion splash rectal: deferred ext: no cynanosis, clubbing, edema skeletal: no deformities neuro: oriented x 3; no focal abnormalities    Impression & Recommendations:  Problem # 1:  GERD (ICD-530.81) Assessment  Deteriorated  She has symptoms despite various PPI therapies.  In addition, she has some dysphagia raising the question of a peptic esophageal stricture.  Recommendations #1 trial of Zegerid 40 mg q.h.s. #2 upper endoscopy to rule out Barrett's esophagus and for dilatation as indicated  Risks, alternatives, and complications of the procedure, including bleeding, perforation, and possible need for surgery, were explained to the patient.  Patient's questions were answered.  Orders: EGD (EGD)  Patient Instructions: 1)  Copy sent to : Loreen Freud, DO  2)  Conscious Sedation brochure given.  3)  Upper Endoscopy with Dilatation brochure given.  4)  Your EGD is scheduled on 11/07/2009 at 4pm 5)  The medication list was reviewed and reconciled.  All changed / newly prescribed medications were explained.  A complete medication list was provided to the patient / caregiver. Prescriptions: ZEGERID 40-1100 MG CAPS (OMEPRAZOLE-SODIUM BICARBONATE) take one tab q.h.s.  #15 x 5   Entered and Authorized by:   Louis Meckel MD   Signed by:   Louis Meckel MD on 10/30/2009   Method used:   Electronically to        [facility] [address] (retail)       [phone]       [place], [place]  [address]       Ph: [phone]       Fax: [phone]   RxID:   [id number]

## 2010-02-19 NOTE — Procedures (Signed)
Summary: Upper Endoscopy  Patient: Nicole Richard Note: All result statuses are Final unless otherwise noted.  Tests: (1) Upper Endoscopy (EGD)   EGD Upper Endoscopy       DONE     [facility]     [address]     [place], [place]  [address]           ENDOSCOPY PROCEDURE REPORT           PATIENT:  Nicole Richard, Nicole Richard  MR#:  [id number]     BIRTHDATE:  January 26, 1955, 54 yrs. old  GENDER:  female           ENDOSCOPIST:  Barbette Hair. Arlyce Dice, MD     Referred by:  Loreen Freud, DO           PROCEDURE DATE:  11/07/2009     PROCEDURE:  EGD with biopsy, Elease Hashimoto Dilation of Esophagus     ASA CLASS:  Class I     INDICATIONS:  GERD, dysphagia           MEDICATIONS:   Fentanyl 75 mcg IV, Versed 7 mg IV, glycopyrrolate     (Robinal) 0.2 mg IV, 0.6cc simethancone 0.6 cc PO     TOPICAL ANESTHETIC:  Exactacain Spray           DESCRIPTION OF PROCEDURE:   After the risks benefits and     alternatives of the procedure were thoroughly explained, informed     consent was obtained.  The LB GIF-H180 [id number] endoscope was     introduced through the mouth and advanced to the third portion of     the duodenum, without limitations.  The instrument was slowly     withdrawn as the mucosa was fully examined.     <<PROCEDUREIMAGES>>           An ulcer was found in the antrum. Superficial 3mm ulcer overlying     slightly enlarged fold. Bxs taken (see image1).  A stricture was     found at the gastroesophageal junction (see image2). Early     esophageal stricture Dilation with maloney dilator 18mm Minimal     resistance; no heme  Otherwise the examination was normal.     Retroflexed views revealed no abnormalities.    The scope was then     withdrawn from the patient and the procedure completed.           COMPLICATIONS:  None           ENDOSCOPIC IMPRESSION:     1) Ulcer in the antrum     2) Stricture at the gastroesophageal junction     3) Otherwise normal examination     RECOMMENDATIONS:     1)  Call office next 2-3 days to schedule an office appointment     for 3-4 weeks     2) begin zegerid 40mg  at bedtime           REPEAT EXAM:  No           ______________________________     Barbette Hair. Arlyce Dice, MD           CC:           n.     eSIGNED:   Barbette Hair. Kaplan at 11/07/2009 04:43 PM           Vonna Kotyk, [id number]  Note: An exclamation mark (!) indicates a result that was not dispersed into the flowsheet. Document Creation Date: 11/07/2009 4:43  PM _______________________________________________________________________  (1) Order result status: Final Collection or observation date-time: 11/07/2009 16:37 Requested date-time:  Receipt date-time:  Reported date-time:  Referring Physician:   Ordering Physician: Melvia Heaps [phone]) Specimen Source:  Source: Launa Grill Order Number: [id number] Lab site:

## 2010-02-19 NOTE — Progress Notes (Signed)
Summary: Results 9/23  Phone Note Outgoing Call   Call placed by: Almeta Monas CMA Duncan Dull),  October 12, 2009 10:40 AM Call placed to: Patient Details for Reason: Pathology Results were Normal for Neck Skin tags Summary of Call: Left message to call back' Almeta Monas CMA Duncan Dull)  October 12, 2009 10:41 AM   Follow-up for Phone Call        left detailed msg on patient voicemail w/ information.....Marland KitchenMarland KitchenDoristine Devoid CMA  October 15, 2009 11:04 AM

## 2010-02-19 NOTE — Letter (Signed)
Summary: EGD Instructions  [facility]  [address] [place], [place] [address]   Phone: [phone]  Fax: [phone]       Nicole Richard    11-02-1955    MRN: [id number]       Procedure Day /Date:WEDNESDAY 11/07/2009     Arrival Time: 3PM     Procedure Time:4PM     Location of Procedure:                    X  [facility] (4th Floor)    PREPARATION FOR ENDOSCOPY/DIL   On10/19/2011 THE DAY OF THE PROCEDURE:  1.   No solid foods, milk or milk products are allowed after midnight the night before your procedure.  2.   Do not drink anything colored red or purple.  Avoid juices with pulp.  No orange juice.  3.  You may drink clear liquids until2PM which is 2 hours before your procedure.                                                                                                CLEAR LIQUIDS INCLUDE: Water Jello Ice Popsicles Tea (sugar ok, no milk/cream) Powdered fruit flavored drinks Coffee (sugar ok, no milk/cream) Gatorade Juice: apple, white grape, white cranberry  Lemonade Clear bullion, consomm, broth Carbonated beverages (any kind) Strained chicken noodle soup Hard Candy   MEDICATION INSTRUCTIONS  Unless otherwise instructed, you should take regular prescription medications with a small sip of water as early as possible the morning of your procedure.            OTHER INSTRUCTIONS  You will need a responsible adult at least 53 years of age to accompany you and drive you home.   This person must remain in the waiting room during your procedure.  Wear loose fitting clothing that is easily removed.  Leave jewelry and other valuables at home.  However, you may wish to bring a book to read or an iPod/MP3 player to listen to music as you wait for your procedure to start.  Remove all body piercing jewelry and leave at home.  Total time from sign-in until discharge is approximately 2-3 hours.  You should go home directly  after your procedure and rest.  You can resume normal activities the day after your procedure.  The day of your procedure you should not:   Drive   Make legal decisions   Operate machinery   Drink alcohol   Return to work  You will receive specific instructions about eating, activities and medications before you leave.    The above instructions have been reviewed and explained to me by   _______________________    I fully understand and can verbalize these instructions _____________________________ Date _________

## 2010-02-19 NOTE — Letter (Signed)
Summary: Primary Care Consult Scheduled Letter  [facility]  [address] [place], [place] [address]   Phone: [phone]  Fax: [phone]      09/17/2009 MRN: [id number]  [facility] [place] [address] [place], [place]  [address]    Dear Ms. [place]STON,    We have scheduled an appointment for you.  At the recommendation of Dr. Loreen Freud, we have scheduled you a consult with Dr. Melvia Heaps of [facility] on 10-30-2009 at 2:00pm.  Their address is [address] [place], [place] [address]. The office phone number is [phone].  If this appointment day and time is not convenient for you, please feel free to call the office of the doctor you are being referred to at the number listed above and reschedule the appointment.    It is important for you to keep your scheduled appointments. We are here to make sure you are given good patient care.   Thank you,    Renee, Patient Care Coordinator [facility]

## 2010-02-19 NOTE — Medication Information (Signed)
Summary: Prior Authorization & Approved Omeprazole / Tedd Sias  Prior Authorization & Approved Omeprazole / [employer]   Imported By: Lennie Odor 11/15/2009 14:38:56  _____________________________________________________________________  External Attachment:    Type:   Image     Comment:   External Document

## 2010-04-08 ENCOUNTER — Encounter: Payer: Self-pay | Admitting: *Deleted

## 2010-04-08 ENCOUNTER — Other Ambulatory Visit (INDEPENDENT_AMBULATORY_CARE_PROVIDER_SITE_OTHER): Payer: BC Managed Care – PPO

## 2010-04-08 ENCOUNTER — Other Ambulatory Visit: Payer: Self-pay | Admitting: Family Medicine

## 2010-04-08 DIAGNOSIS — E785 Hyperlipidemia, unspecified: Secondary | ICD-10-CM

## 2010-04-08 DIAGNOSIS — R7989 Other specified abnormal findings of blood chemistry: Secondary | ICD-10-CM

## 2010-04-08 LAB — BASIC METABOLIC PANEL
BUN: 24 mg/dL — ABNORMAL HIGH (ref 6–23)
Calcium: 9.7 mg/dL (ref 8.4–10.5)
Chloride: 101 mEq/L (ref 96–112)
Glucose, Bld: 98 mg/dL (ref 70–99)
Sodium: 136 mEq/L (ref 135–145)

## 2010-04-08 LAB — LIPID PANEL
HDL: 59.7 mg/dL (ref >39.00)
Total CHOL/HDL Ratio: 3
Triglycerides: 82 mg/dL (ref 0.0–149.0)
VLDL: 16.4 mg/dL (ref 0.0–40.0)

## 2010-04-08 LAB — HEPATIC FUNCTION PANEL
AST: 26 U/L (ref 0–37)
Alkaline Phosphatase: 72 U/L (ref 39–117)
Bilirubin, Direct: 0.2 mg/dL (ref 0.0–0.3)

## 2010-06-07 NOTE — H&P (Signed)
NAMEDENYM, CHRISTENBERRY              ACCOUNT NO.:  [id number]   MEDICAL RECORD NO.:  [id number]          PATIENT TYPE:  AMB   LOCATION:  SDC                           FACILITY:  [facility]   PHYSICIAN:  Dineen Kid. Rana Snare, M.D.    DATE OF BIRTH:  12/19/1955   DATE OF ADMISSION:  09/09/2004  DATE OF DISCHARGE:                                HISTORY & PHYSICAL   HISTORY OF PRESENT ILLNESS:  Ms. Fraser Din is a 55 year old gravida 4, para 2,  abortus 2, who was referred to me for further evaluation of left lower  quadrant pain, pain with intercourse, and also a history of fibroids.  She  also has a problem with urinary stress incontinence and seen Lindaann Slough, M.D., urologist, who is considering midurethral sling.  Her pain has  been worsening over the last several months and also has frequency of  urination.  Again has pain with intercourse.  Menorrhagia and dysmenorrhea  have also been present for a number of months to years.  Ultrasound this  month shows a large 7.9 x 6.8 x 6.3 cm fibroid on the left side.  Ovaries  are within normal limits.  She also has a strong family history of uterine  cancer and is very concerned about this.  Previously had tubal ligation in  1982.  She presents for definitive surgical intervention and planned  laparoscopically-assisted vaginal hysterectomy.  She desires preservation of  her ovaries and planned a combined procedure with Dr. Brunilda Payor for midurethral  sling.   PAST MEDICAL HISTORY:  Significant for history of asthma and chronic  bronchitis, hypercholesterolemia, hypertension, and gastroesophageal reflux  disease.   PAST SURGICAL HISTORY:  She has had tonsillectomy in 1966, tubal ligation in  1982, appendectomy in 1979.   MEDICATIONS:  1.  Atenolol 25 mg nightly  2.  Lipitor 20 mg a day.  3.  AcipHex daily.  4.  Singulair daily.  5.  Nasonex daily.   PAST OBSTETRICAL HISTORY:  She has had a history of two vaginal deliveries,  the largest being 9 pounds 8  ounces.  She had one spontaneous miscarriage  and one ectopic pregnancy.   ALLERGIES:  No known drug allergies.   PHYSICAL EXAMINATION:  VITAL SIGNS:  Blood pressure 130/72.  HEART:  Regular rate and rhythm.  LUNGS:  Clear to auscultation bilaterally.  ABDOMEN:  Soft, nontender, and nondistended.  No lymphadenopathy noted.  PELVIC:  She has normal external genitalia, Bartholin's, Skene's, and  urethra.  Cervix is within normal limits.  She does have a hypermobile  urethra with Valsalva.  Minimal cystocele and rectocele is noted.  Uterus is  approximately 8 to 10 weeks size with a large fundal fibroid noted on the  left side.  No adnexal masses are palpable.  The uterus is mobile with a  second degree descensus with Valsalva.  Rectovaginal examination confirms  the above.   IMPRESSION:  Menorrhagia, dysmenorrhea, pelvic pain, and dyspareunia with  large fibroids.  Also urinary stress incontinence.  I discussed different  options with the patient which included expectant management, uterine  embolization, or  hysterectomy.  We are going to attempt laparoscopically-  assisted vaginal hysterectomy.  She desires preservation of her ovaries.  She also does have a hypermobile urethra and plan a combined procedure with  Dr. Brunilda Payor for midurethral sling.  I discussed the risks and benefits of the  procedure at length which include, but not limited to, risk of infection,  bleeding, damage to the bowel, bladder, ureters, and possibly this may not  alleviate the pelvic pain, could worsen the pelvic pain, and no guarantees  are made as far as the ovaries or ovarian function.  We will try to preserve  both ovaries if at all possible.  She will allow [place] to make the decision at  the time of surgery depending upon the appearance of the ovaries, whether or  not to preserve them, however.  She understands there are risks of general  anesthesia, risks associated with blood transfusion which also include,  but  not limited to, risks of infection with hepatitis or AIDS or reaction to the  blood.  All of her questions are answered and she has given informed  consent.      Dineen Kid Rana Snare, M.D.  Electronically Signed     DCL/MEDQ  D:  09/08/2004  T:  09/09/2004  Job:  [id number]

## 2010-06-07 NOTE — Discharge Summary (Signed)
NAMEANATASIA, TINO              ACCOUNT NO.:  [id number]   MEDICAL RECORD NO.:  [id number]          PATIENT TYPE:  OBV   LOCATION:  9316                          [facility]ACILITY:  [facility]   PHYSICIAN:  Dineen Kid. Rana Snare, M.D.    DATE OF BIRTH:  [birth year]   DATE OF ADMISSION:  09/09/2004  DATE OF DISCHARGE:                                 DISCHARGE SUMMARY   HISTORY OF PRESENT ILLNESS:  Ms. Fraser Din is a 55 year old G4 P2 A2 referred  to me for evaluation of left lower quadrant pain, pain with intercourse,  history of large fibroids; also has urinary incontinence, seen by Dr. Su Grand, urologist here in town, who is planning a midurethral sling. Her pain  has been worsening over the last several months to year, pain with  intercourse. Ultrasound shows at least one large fibroid measuring 7.9 cm in  size on the left side, ovaries within normal limits. She also has a strong  family history of uterine cancer, very concerned about this. She presents  for laparoscopic-assisted vaginal hysterectomy, and midurethral sling by Dr.  Brunilda Payor.   HOSPITAL COURSE:  The patient underwent a laparoscopic-assisted vaginal  hysterectomy. The procedure was uncomplicated. The estimated blood loss  during the procedure was 500 mL. Findings at the time of surgery were a 400-  gram uterus and pelvic adhesions. Dr. Brunilda Payor performed an obturator sling was  similarly uncomplicated. The patient's postoperative care was unremarkable.  By postoperative day #1 she was tolerating a regular diet, ambulating  without difficulty, voiding spontaneously. Her postoperative hemoglobin was  12.4; regular active bowel sounds; the incisions were clean, dry, and  intact; and the patient was discharged home with follow-up in the office in  1-2 weeks. She was sent home with a prescription for Vicodin #30 and told to  return for increased pain, fever, or bleeding.      Dineen Kid Rana Snare, M.D.  Electronically Signed     DCL/MEDQ  D:   09/10/2004  T:  09/10/2004  Job:  [id number]

## 2010-06-07 NOTE — Op Note (Signed)
NAMEJOURI, Nicole Richard              ACCOUNT NO.:  [id number]   MEDICAL RECORD NO.:  [id number]          PATIENT TYPE:  OBV   LOCATION:  9399                          FACILITY:  [facility]   PHYSICIAN:  Lindaann Slough, M.D.  DATE OF BIRTH:  1955/09/08   DATE OF PROCEDURE:  09/09/2004  DATE OF DISCHARGE:                                 OPERATIVE REPORT   PREOPERATIVE DIAGNOSIS:  Stress incontinence.   POSTOPERATIVE DIAGNOSIS:  Stress incontinence.   OPERATION/PROCEDURE:  1.  Transobturator sling.  2.  Cystoscopy.   SURGEON:  Barnie Del, M.D.   ANESTHESIA:  General.   INDICATIONS:  The patient is a 55 year old female who had been complaining  of frequency, urgency and stress incontinence for about two years.  Pelvic  ultrasound showed a large fibroid.  The patient is scheduled for  laparoscopic-assisted vaginal hysterectomy and she desired to have her  incontinence taken care of.  She is scheduled for transobturator sling  procedure.   DESCRIPTION OF PROCEDURE:  Under general anesthesia the patient was prepped  and draped and placed in the dorsal lithotomy position.  The hysterectomy  was performed by Dr. Rana Snare.  After hysterectomy, the patient was reprepped  and draped and left in the dorsal lithotomy position.  The labia majora were  retracted laterally.  Then the vaginal mucosa was infiltrated with 1%  lidocaine with epinephrine.  A vaginal speculum was then placed in the  vagina.  A 2 cm longitudinal incision was made from 1 cm from the urethral  meatus.  Incision was carried down to the subcutaneous tissue.  The vaginal  mucosa was then dissected from the periurethral tissues to the inferior  pubic ramus.  Hemostasis was achieved with electrocautery.  Then a skin  incision was made at about 5 cm from the midline at the level of the  clitoris.  A C-needle was then passed through the skin incision and through  the obturator foramen and through the vaginal incision.  The same  procedure  was done on both sides.  Then the Foley catheter that was previously placed  in the bladder was removed.  A cystoscope was then inserted in the bladder.  There is no stone or tumor in the bladder.  The ureteral orifices are in  normal position and shape with clear efflux.  There is a small hematoma in  the midline above the ureteral orifices.  There is no evidence of  penetration of the bladder with the C-needles.  The bladder was examined  with the foroblique and right-angle lenses.  The cystoscope was then  removed.  The Foley catheter was reinserted in the bladder and the sling  material was passed into the eyes of the C-needles and pulled through the  skin incisions.  The plastic sheath over the sling material was then  removed.  A needle holder was passed between the urethra and sling material.  Then the sling material was cut at the skin level and there was no tension  on the urethra from the sling material.  The wound was irrigated with bug  juice; then the  vaginal incision was closed with 2-0 Vicryl.  Skin incision  was closed with 4-0 Monocryl.  The sutures that were  used to retract the labia were then removed.  A vaginal packing was then  left in the vagina.  Estimated blood loss was minimal.  Blood replacement:  none.   The patient tolerated the procedure well and left the OR in satisfactory  condition to post anesthesia care unit.      Lindaann Slough, M.D.  Electronically Signed     MN/MEDQ  D:  09/09/2004  T:  09/09/2004  Job:  [id number]   cc:   Dineen Kid. Rana Snare, M.D.  [address]  [place]  [place] [address]  Fax: [phone]   Angelena Sole, M.D. [facility]

## 2010-06-07 NOTE — Op Note (Signed)
Nicole Richard, Nicole Richard              ACCOUNT NO.:  [id number]   MEDICAL RECORD NO.:  [id number]          PATIENT TYPE:  OBV   LOCATION:  9399                          FACILITY:  [facility]   PHYSICIAN:  Dineen Kid. Rana Snare, M.D.    DATE OF BIRTH:  May 08, 1955   DATE OF PROCEDURE:  09/09/2004  DATE OF DISCHARGE:                                 OPERATIVE REPORT   PREOPERATIVE DIAGNOSIS:  Menorrhagia, dysmenorrhea, pelvic pain, dyspareunia  and 10 to 12 weeks' size fibroids.   POSTOPERATIVE DIAGNOSIS:  Menorrhagia, dysmenorrhea, pelvic pain,  dyspareunia and 10 to 12 weeks' size fibroids.  Pelvic adhesions.  400 g  uterus.   OPERATION PERFORMED:  Laparoscopically assisted vaginal hysterectomy and  lysis of adhesions.   SURGEON:  Dineen Kid. Rana Snare, M.D.   ASSISTANT:  Duke Salvia. Marcelle Overlie, M.D.   ANESTHESIA:  General endotracheal.   INDICATIONS FOR PROCEDURE:  Ms. Fraser Din is a 55 year old G2, P2, A2 with  left lower quadrant pain, dyspareunia, history of fibroids, menorrhagia,  dysmenorrhea, not responsive to conservative medical management.  10 to 12  weeks' size fibroids on ultrasound.  She also has stress urinary  incontinence and has been evaluated for obturator endometrial sling by Dr.  Brunilda Payor.  She presents today for these procedures. The risks and benefits were  discussed at length.  Informed consent was obtained.  See history and  physical for the details.   FINDINGS AT TIME OF SURGERY:  Grossly enlarged uterus.  Adhesions from the  omentum and bowel to the anterior abdominal wall from the umbilicus to the  right lower quadrant and also to the left fallopian tube.  Surgically absent  appendix.  Normal-appearing liver and normal-appearing ovaries.   DESCRIPTION OF PROCEDURE:  After adequate analgesia, the patient was placed  in dorsal lithotomy position.  She was sterilely prepped and draped.  The  bladder was sterilely drained.  A Graves speculum was placed and the Hulka  tenaculum was placed  on the anterior lip of the cervix.  A 1 cm  infraumbilical skin incision was then made.  A Veress needle was inserted.  The abdomen was insufflated to dullness to percussion, 11 mm trocar was  inserted and the above findings were noted.  A Gyrus tripolar Ligasure was  used to dissect and ligate the adhesions from the anterior abdominal wall,  care taken to avoid the underlying bowel and also achieve good hemostasis  after all adhesions had been freed.  A 5 mm trocar was inserted to the left  of midline two fingerbreadths above the pubic symphysis under direct  visualization.  The left utero-ovarian ligament was identified, dissected  and ligated down across the round ligament.  The right utero-ovarian  ligament was dissected similarly down across the round ligament.  The  bladder was then elevated at the ureterovesical angle, dissected off the  anterior surface of the uterus.  The legs were repositioned.  The abdomen  was desufflated and a weighted speculum was placed in the vagina.  Posterior  colpotomy was performed and the cervix was then circumscribed with Bovie  cautery.  The  Ligasure was used to ligate across the uterosacral ligaments  bilaterally, the cardinal ligaments and then the bladder pillars  bilaterally, the anterior vaginal mucosa was then dissected free from the  anterior surface and the bladder was then dissected off the anterior portion  of the uterus, anterior peritoneum opened.  Deaver retractor placed  underneath the bladder.  The Ligasure was then used to successively  coagulate and dissect with Mayo scissors to the inferior portion of the  broad ligament.  The fundus of the uterus was then grasped with Cloward  tenaculum, delivered into the introitus.  The remaining portion of the  pedicles were then ligated and dissected.  The uterus then removed, noted to  be 400 g.  Ribbon packing was then placed in the vagina.  Uterosacral  ligaments were identified, ligated with  figure-of-eight 0 Monocryl suture.  Posterior peritoneum was closed in pursestring fashion.  Posterior vaginal  mucosa was then closed in a vertical fashion using figure-of-eights of 0  Monocryl suture.  Packing was then removed and the anterior vaginal mucosa  was closed in a similar fashion using figure-of-eights of 0 Monocryl suture  with good approximation, good hemostasis.  The uterosacral ligaments were  plicated in the midline with good vaginal support noted. Foley catheter was  placed through the urethra with good return of yellow urine.  The legs were  repositioned, abdomen reinsufflated.  A Nizhat suction irrigator was used to  irrigate the pelvis and after a copious amount of irrigation, hemostasis was  achieved with bipolar cautery along the peritoneal edges after re-  examination of all the pedicles in the cul-de-sac and good hemostasis had  been achieved.  The trocars were then removed, the abdomen desufflated.  Infraumbilical skin incision was closed with 0 Vicryl interrupted suture in  the fascia, 3-0 Vicryl Rapide subcuticular suture closing the skin, 5 mm  site was closed with 3-0 Vicryl Rapide subcuticular suture. Good  approximation, good hemostasis.  Incisions were then injected with 0.25%  Marcaine, 10 mL total used.  At this point in the procedure, total 500 mL  estimated blood loss.  Sponge, needle and instrument counts were normal  times three.  The patient received 1 g Rocephin preoperatively.  Dr. Brunilda Payor  continued the case with his midurethral sling.      Dineen Kid Rana Snare, M.D.  Electronically Signed     DCL/MEDQ  D:  09/09/2004  T:  09/09/2004  Job:  [id number]

## 2010-06-22 ENCOUNTER — Other Ambulatory Visit: Payer: Self-pay | Admitting: Family Medicine

## 2010-06-24 NOTE — Telephone Encounter (Signed)
Rx faxed.    KP 

## 2010-07-01 ENCOUNTER — Other Ambulatory Visit: Payer: Self-pay | Admitting: Family Medicine

## 2010-07-01 DIAGNOSIS — Z1231 Encounter for screening mammogram for malignant neoplasm of breast: Secondary | ICD-10-CM

## 2010-07-25 ENCOUNTER — Other Ambulatory Visit: Payer: Self-pay | Admitting: Family Medicine

## 2010-09-02 ENCOUNTER — Ambulatory Visit
Admission: RE | Admit: 2010-09-02 | Discharge: 2010-09-02 | Disposition: A | Discharge status: Home / Self Care | Payer: BC Managed Care – PPO | Source: Ambulatory Visit | Attending: Family Medicine | Admitting: Family Medicine

## 2010-09-02 DIAGNOSIS — Z1231 Encounter for screening mammogram for malignant neoplasm of breast: Secondary | ICD-10-CM

## 2010-09-16 ENCOUNTER — Ambulatory Visit (INDEPENDENT_AMBULATORY_CARE_PROVIDER_SITE_OTHER): Payer: BC Managed Care – PPO | Admitting: Family Medicine

## 2010-09-16 ENCOUNTER — Encounter: Payer: Self-pay | Admitting: Family Medicine

## 2010-09-16 ENCOUNTER — Other Ambulatory Visit (HOSPITAL_COMMUNITY)
Admission: RE | Admit: 2010-09-16 | Discharge: 2010-09-16 | Disposition: A | Discharge status: Home / Self Care | Payer: BC Managed Care – PPO | Source: Ambulatory Visit | Attending: Family Medicine | Admitting: Family Medicine

## 2010-09-16 VITALS — BP 120/70 | HR 76 | Temp 98.9°F | Ht 60.5 in | Wt 147.8 lb

## 2010-09-16 DIAGNOSIS — E785 Hyperlipidemia, unspecified: Secondary | ICD-10-CM

## 2010-09-16 DIAGNOSIS — Z0000 Encounter for general adult medical examination without abnormal findings: Secondary | ICD-10-CM

## 2010-09-16 DIAGNOSIS — Z780 Asymptomatic menopausal state: Secondary | ICD-10-CM

## 2010-09-16 DIAGNOSIS — J309 Allergic rhinitis, unspecified: Secondary | ICD-10-CM

## 2010-09-16 DIAGNOSIS — I10 Essential (primary) hypertension: Secondary | ICD-10-CM

## 2010-09-16 DIAGNOSIS — K219 Gastro-esophageal reflux disease without esophagitis: Secondary | ICD-10-CM

## 2010-09-16 DIAGNOSIS — Z01419 Encounter for gynecological examination (general) (routine) without abnormal findings: Secondary | ICD-10-CM | POA: Insufficient documentation

## 2010-09-16 DIAGNOSIS — J302 Other seasonal allergic rhinitis: Secondary | ICD-10-CM

## 2010-09-16 LAB — HEPATIC FUNCTION PANEL
ALT: 30 U/L (ref 0–35)
Albumin: 4.3 g/dL (ref 3.5–5.2)
Bilirubin, Direct: 0.2 mg/dL (ref 0.0–0.3)
Total Bilirubin: 1.6 mg/dL — ABNORMAL HIGH (ref 0.3–1.2)

## 2010-09-16 LAB — BASIC METABOLIC PANEL
Calcium: 9.7 mg/dL (ref 8.4–10.5)
Chloride: 107 mEq/L (ref 96–112)
GFR: 90 mL/min (ref >60.00)

## 2010-09-16 LAB — CBC WITH DIFFERENTIAL/PLATELET
Eosinophils Relative: 4.5 % (ref 0.0–5.0)
HCT: 45.8 % (ref 36.0–46.0)
Hemoglobin: 15.4 g/dL — ABNORMAL HIGH (ref 12.0–15.0)
Lymphs Abs: 2.5 10*3/uL (ref 0.7–4.0)
MCHC: 33.6 g/dL (ref 30.0–36.0)
MCV: 91.8 fl (ref 78.0–100.0)
Monocytes Relative: 6.1 % (ref 3.0–12.0)
Neutro Abs: 5.3 10*3/uL (ref 1.4–7.7)
RBC: 4.98 Mil/uL (ref 3.87–5.11)

## 2010-09-16 LAB — TSH: TSH: 0.84 u[IU]/mL (ref 0.35–5.50)

## 2010-09-16 LAB — HEMOGLOBIN A1C: Hgb A1c MFr Bld: 6.2 % (ref 4.6–6.5)

## 2010-09-16 LAB — LIPID PANEL
Cholesterol: 143 mg/dL (ref 0–200)
LDL Cholesterol: 68 mg/dL (ref 0–99)
Triglycerides: 101 mg/dL (ref 0.0–149.0)
VLDL: 20.2 mg/dL (ref 0.0–40.0)

## 2010-09-16 MED ORDER — ATENOLOL 25 MG PO TABS: ORAL_TABLET | ORAL | Status: DC

## 2010-09-16 MED ORDER — ATORVASTATIN CALCIUM 40 MG PO TABS: 40.0000 mg | ORAL_TABLET | Freq: Every day | ORAL | Status: DC

## 2010-09-16 MED ORDER — MONTELUKAST SODIUM 10 MG PO TABS: ORAL_TABLET | ORAL | Status: DC

## 2010-09-16 MED ORDER — RABEPRAZOLE SODIUM 20 MG PO TBEC: 20.0000 mg | DELAYED_RELEASE_TABLET | Freq: Every day | ORAL | Status: DC

## 2010-09-16 NOTE — Patient Instructions (Signed)

## 2010-09-16 NOTE — Progress Notes (Signed)
Subjective:     Nicole Richard is a 54 y.o. female and is here for a comprehensive physical exam. The patient reports no problems.  History   Social History  . Marital Status: Legally Separated    Spouse Name: N/A    Number of Children: N/A  . Years of Education: N/A   Occupational History  . transitional care [employer]   Social History Main Topics  . Smoking status: Never Smoker   . Smokeless tobacco: Never Used  . Alcohol Use: Yes     rare  . Drug Use: No  . Sexually Active: Not Currently -- Female partner(s)   Other Topics Concern  . Not on file   Social History Narrative  . No narrative on file   Health Maintenance  Topic Date Due  . Pap Smear  07/16/1975  . Influenza Vaccine  10/21/2010  . Tetanus/tdap  11/30/2011  . Mammogram  09/01/2012  . Colonoscopy  08/16/2017    The following portions of the patient's history were reviewed and updated as appropriate: allergies, current medications, past family history, past medical history, past social history, past surgical history and problem list.  Review of Systems Review of Systems  Constitutional: Negative for activity change, appetite change and fatigue.  HENT: Negative for hearing loss, congestion, tinnitus and ear discharge.  dentist q14m Eyes: Negative for visual disturbance (see optho q1y -- vision corrected to 20/20 with glasses).  Respiratory: Negative for cough, chest tightness and shortness of breath.   Cardiovascular: Negative for chest pain, palpitations and leg swelling.  Gastrointestinal: Negative for abdominal pain, diarrhea, constipation and abdominal distention.  Genitourinary: Negative for urgency, frequency, decreased urine volume and difficulty urinating.  Musculoskeletal: Negative for back pain, arthralgias and gait problem.  Skin: Negative for color change, pallor and rash.  Neurological: Negative for dizziness, light-headedness, numbness and headaches.  Hematological:  Negative for adenopathy. Does not bruise/bleed easily.  Psychiatric/Behavioral: Negative for suicidal ideas, confusion, sleep disturbance, self-injury, dysphoric mood, decreased concentration and agitation.       Objective:    BP 120/70  Pulse 76  Temp(Src) 98.9 F (37.2 C) (Oral)  Ht 5' 0.5" (1.537 m)  Wt 147 lb 12.8 oz (67.042 kg)  BMI 28.39 kg/m2  SpO2 97%  General Appearance:    Alert, cooperative, no distress, appears stated age  Head:    Normocephalic, without obvious abnormality, atraumatic  Eyes:    PERRL, conjunctiva/corneas clear, EOM's intact, fundi    benign, both eyes  Ears:    Normal TM's and external ear canals, both ears  Nose:   Nares normal, septum midline, mucosa normal, no drainage    or sinus tenderness  Throat:   Lips, mucosa, and tongue normal; teeth and gums normal  Neck:   Supple, symmetrical, trachea midline, no adenopathy;    thyroid:  no enlargement/tenderness/nodules; no carotid   bruit or JVD  Back:     Symmetric, no curvature, ROM normal, no CVA tenderness  Lungs:     Clear to auscultation bilaterally, respirations unlabored  Chest Wall:    No tenderness or deformity   Heart:    Regular rate and rhythm, S1 and S2 normal, no murmur, rub   or gallop  Breast Exam:    No tenderness, masses, or nipple abnormality  Abdomen:     Soft, non-tender, bowel sounds active all four quadrants,    no masses, no organomegaly  Genitalia:    Normal female without lesion, discharge or tenderness,  Uterus surgically absent,  No adenexal masses  Rectal:    Normal tone, normal prostate, no masses or tenderness;   guaiac negative stool  Extremities:   Extremities normal, atraumatic, no cyanosis or edema  Pulses:   2+ and symmetric all extremities  Skin:   Skin color, texture, turgor normal, no rashes or lesions  Lymph nodes:   Cervical, supraclavicular, and axillary nodes normal  Neurologic:   CNII-XII intact, normal strength, sensation and reflexes    throughout        Assessment:    Healthy female exam.   hyperlipidemia htn hyperglycemia Plan:  ghm utd  check fasting labs See After Visit Summary for Counseling Recommendations

## 2010-09-17 ENCOUNTER — Other Ambulatory Visit: Payer: Self-pay | Admitting: Family Medicine

## 2010-09-17 DIAGNOSIS — K219 Gastro-esophageal reflux disease without esophagitis: Secondary | ICD-10-CM

## 2010-09-17 MED ORDER — RABEPRAZOLE SODIUM 20 MG PO TBEC: 20.0000 mg | DELAYED_RELEASE_TABLET | Freq: Every day | ORAL | Status: DC

## 2010-09-17 NOTE — Telephone Encounter (Signed)
Refaxed   KP 

## 2010-09-19 ENCOUNTER — Telehealth: Payer: Self-pay | Admitting: *Deleted

## 2010-09-19 NOTE — Telephone Encounter (Signed)
i believe she has already tried omeprazole and nexium

## 2010-09-19 NOTE — Telephone Encounter (Signed)
Call from [facility] stating that Aciphex is not covered under Pt plan without PA.  Covered med include: Nexium, omeprazole, Pantoprazole. Please Advise if one of these med would be ok for Pt or would PA be more appropriate.

## 2010-09-20 ENCOUNTER — Other Ambulatory Visit: Payer: Self-pay | Admitting: Family Medicine

## 2010-09-20 NOTE — Telephone Encounter (Signed)
Prior auth approved until 09-19-12. Case id: [id number]

## 2010-09-26 ENCOUNTER — Ambulatory Visit
Admission: RE | Admit: 2010-09-26 | Discharge: 2010-09-26 | Disposition: A | Discharge status: Home / Self Care | Payer: BC Managed Care – PPO | Source: Ambulatory Visit | Attending: Family Medicine | Admitting: Family Medicine

## 2010-09-26 DIAGNOSIS — Z780 Asymptomatic menopausal state: Secondary | ICD-10-CM

## 2010-10-08 ENCOUNTER — Encounter: Payer: Self-pay | Admitting: Family Medicine

## 2010-10-15 ENCOUNTER — Encounter: Payer: Self-pay | Admitting: *Deleted

## 2010-10-16 ENCOUNTER — Telehealth: Payer: Self-pay | Admitting: *Deleted

## 2010-10-16 NOTE — Telephone Encounter (Signed)
Prior auth approved 08-30-10 until 09-19-12, pharmacy notified via fax, approval letter scan to chart.

## 2010-10-16 NOTE — Telephone Encounter (Signed)
PT states that she just started taking the calcium and vitamin D after OV as advise by Dr Laury Axon prior to test. Pt will continue with this and recheck in 2 years.

## 2010-11-26 ENCOUNTER — Ambulatory Visit (INDEPENDENT_AMBULATORY_CARE_PROVIDER_SITE_OTHER): Payer: BC Managed Care – PPO | Admitting: Family Medicine

## 2010-11-26 ENCOUNTER — Encounter: Payer: Self-pay | Admitting: Family Medicine

## 2010-11-26 VITALS — BP 118/72 | HR 78 | Temp 98.9°F | Ht 61.0 in | Wt 147.0 lb

## 2010-11-26 DIAGNOSIS — J329 Chronic sinusitis, unspecified: Secondary | ICD-10-CM

## 2010-11-26 MED ORDER — GUAIFENESIN-CODEINE 100-10 MG/5ML PO SYRP: ORAL_SOLUTION | ORAL | Status: DC

## 2010-11-26 MED ORDER — MOMETASONE FUROATE 50 MCG/ACT NA SUSP: 2.0000 | Freq: Every day | NASAL | Status: DC

## 2010-11-26 MED ORDER — AMOXICILLIN-POT CLAVULANATE 875-125 MG PO TABS: 1.0000 | ORAL_TABLET | Freq: Two times a day (BID) | ORAL | Status: AC

## 2010-11-26 NOTE — Patient Instructions (Signed)

## 2010-11-26 NOTE — Progress Notes (Signed)
  Subjective:     Nicole Richard is a 55 y.o. female who presents for evaluation of symptoms of a URI. Symptoms include left ear pressure/pain, congestion, facial pain, nasal congestion, no  fever, productive cough with  green colored sputum and sinus pressure. Onset of symptoms was 1 week ago, and has been gradually worsening since that time. Treatment to date: none.  The following portions of the patient's history were reviewed and updated as appropriate: allergies, current medications, past family history, past medical history, past social history, past surgical history and problem list.  Review of Systems Pertinent items are noted in HPI.   Objective:    BP 118/72  Pulse 78  Temp(Src) 98.9 F (37.2 C) (Oral)  Ht 5\' 1"  (1.549 m)  Wt 147 lb (66.679 kg)  BMI 27.78 kg/m2  SpO2 98% General appearance: alert, cooperative, appears stated age and no distress Ears: normal TM's and external ear canals both ears Nose: green discharge, moderate congestion, sinus tenderness bilateral Throat: abnormal findings: mild oropharyngeal erythema Neck: mild anterior cervical adenopathy, supple, symmetrical, trachea midline and thyroid not enlarged, symmetric, no tenderness/mass/nodules Lungs: clear to auscultation bilaterally Heart: regular rate and rhythm, S1, S2 normal, no murmur, click, rub or gallop Extremities: extremities normal, atraumatic, no cyanosis or edema   Assessment:    sinusitis   Plan:    Discussed the diagnosis and treatment of sinusitis. Suggested symptomatic OTC remedies. Nasal saline spray for congestion. Augmentin per orders. Nasal steroids per orders. Follow up as needed. Call in several days if symptoms aren't resolving.

## 2011-03-20 ENCOUNTER — Ambulatory Visit: Payer: BC Managed Care – PPO | Admitting: Family Medicine

## 2011-05-01 ENCOUNTER — Other Ambulatory Visit: Payer: Self-pay | Admitting: Family Medicine

## 2011-05-29 ENCOUNTER — Other Ambulatory Visit: Payer: Self-pay | Admitting: Family Medicine

## 2011-05-29 DIAGNOSIS — Z1231 Encounter for screening mammogram for malignant neoplasm of breast: Secondary | ICD-10-CM

## 2011-07-30 ENCOUNTER — Other Ambulatory Visit: Payer: Self-pay | Admitting: Family Medicine

## 2011-09-05 ENCOUNTER — Ambulatory Visit
Admission: RE | Admit: 2011-09-05 | Discharge: 2011-09-05 | Disposition: A | Discharge status: Home / Self Care | Payer: BC Managed Care – PPO | Source: Ambulatory Visit | Attending: Family Medicine | Admitting: Family Medicine

## 2011-09-05 DIAGNOSIS — Z1231 Encounter for screening mammogram for malignant neoplasm of breast: Secondary | ICD-10-CM

## 2011-09-18 ENCOUNTER — Encounter: Payer: Self-pay | Admitting: Family Medicine

## 2011-09-18 ENCOUNTER — Ambulatory Visit (INDEPENDENT_AMBULATORY_CARE_PROVIDER_SITE_OTHER): Payer: BC Managed Care – PPO | Admitting: Family Medicine

## 2011-09-18 ENCOUNTER — Other Ambulatory Visit (HOSPITAL_COMMUNITY)
Admission: RE | Admit: 2011-09-18 | Discharge: 2011-09-18 | Disposition: A | Discharge status: Home / Self Care | Payer: BC Managed Care – PPO | Source: Ambulatory Visit | Attending: Family Medicine | Admitting: Family Medicine

## 2011-09-18 VITALS — BP 116/74 | HR 62 | Temp 98.5°F | Ht 60.0 in | Wt 156.8 lb

## 2011-09-18 DIAGNOSIS — E785 Hyperlipidemia, unspecified: Secondary | ICD-10-CM

## 2011-09-18 DIAGNOSIS — I10 Essential (primary) hypertension: Secondary | ICD-10-CM

## 2011-09-18 DIAGNOSIS — Z01419 Encounter for gynecological examination (general) (routine) without abnormal findings: Secondary | ICD-10-CM | POA: Insufficient documentation

## 2011-09-18 DIAGNOSIS — J302 Other seasonal allergic rhinitis: Secondary | ICD-10-CM

## 2011-09-18 DIAGNOSIS — K219 Gastro-esophageal reflux disease without esophagitis: Secondary | ICD-10-CM

## 2011-09-18 DIAGNOSIS — J309 Allergic rhinitis, unspecified: Secondary | ICD-10-CM

## 2011-09-18 DIAGNOSIS — Z0000 Encounter for general adult medical examination without abnormal findings: Secondary | ICD-10-CM

## 2011-09-18 LAB — LIPID PANEL
HDL: 58.4 mg/dL (ref >39.00)
LDL Cholesterol: 70 mg/dL (ref 0–99)
Total CHOL/HDL Ratio: 3
Triglycerides: 133 mg/dL (ref 0.0–149.0)
VLDL: 26.6 mg/dL (ref 0.0–40.0)

## 2011-09-18 LAB — CBC WITH DIFFERENTIAL/PLATELET
Basophils Relative: 0.6 % (ref 0.0–3.0)
Eosinophils Absolute: 0.2 10*3/uL (ref 0.0–0.7)
Eosinophils Relative: 4.2 % (ref 0.0–5.0)
HCT: 43.8 % (ref 36.0–46.0)
Hemoglobin: 14.6 g/dL (ref 12.0–15.0)
Lymphocytes Relative: 32.8 % (ref 12.0–46.0)
Lymphs Abs: 1.8 10*3/uL (ref 0.7–4.0)
Monocytes Absolute: 0.4 10*3/uL (ref 0.1–1.0)
Monocytes Relative: 7.3 % (ref 3.0–12.0)
Neutro Abs: 3.1 10*3/uL (ref 1.4–7.7)
RBC: 4.86 Mil/uL (ref 3.87–5.11)
RDW: 12.6 % (ref 11.5–14.6)

## 2011-09-18 LAB — BASIC METABOLIC PANEL
GFR: 95.77 mL/min (ref >60.00)
Glucose, Bld: 99 mg/dL (ref 70–99)
Potassium: 4.7 mEq/L (ref 3.5–5.1)
Sodium: 140 mEq/L (ref 135–145)

## 2011-09-18 LAB — HEPATIC FUNCTION PANEL
Bilirubin, Direct: 0.2 mg/dL (ref 0.0–0.3)
Total Bilirubin: 1.6 mg/dL — ABNORMAL HIGH (ref 0.3–1.2)

## 2011-09-18 LAB — TSH: TSH: 0.91 u[IU]/mL (ref 0.35–5.50)

## 2011-09-18 MED ORDER — ATENOLOL 25 MG PO TABS: ORAL_TABLET | ORAL | Status: DC

## 2011-09-18 MED ORDER — IPRATROPIUM-ALBUTEROL 18-103 MCG/ACT IN AERO: 2.0000 | INHALATION_SPRAY | Freq: Four times a day (QID) | RESPIRATORY_TRACT | Status: DC | PRN

## 2011-09-18 MED ORDER — NONFORMULARY OR COMPOUNDED ITEM: Status: DC

## 2011-09-18 MED ORDER — ESOMEPRAZOLE MAGNESIUM 40 MG PO CPDR: 40.0000 mg | DELAYED_RELEASE_CAPSULE | Freq: Every day | ORAL | Status: DC

## 2011-09-18 MED ORDER — MONTELUKAST SODIUM 10 MG PO TABS: ORAL_TABLET | ORAL | Status: DC

## 2011-09-18 MED ORDER — ATORVASTATIN CALCIUM 40 MG PO TABS: 40.0000 mg | ORAL_TABLET | Freq: Every day | ORAL | Status: DC

## 2011-09-18 NOTE — Assessment & Plan Note (Signed)
Stable Change to nexium secondary to ins change

## 2011-09-18 NOTE — Patient Instructions (Addendum)
Preventive Care for Adults, Female A healthy lifestyle and preventive care can promote health and wellness. Preventive health guidelines for women include the following key practices.  A routine yearly physical is a good way to check with your caregiver about your health and preventive screening. It is a chance to share any concerns and updates on your health, and to receive a thorough exam.   Visit your dentist for a routine exam and preventive care every 6 months. Brush your teeth twice a day and floss once a day. Good oral hygiene prevents tooth decay and gum disease.   The frequency of eye exams is based on your age, health, family medical history, use of contact lenses, and other factors. Follow your caregiver's recommendations for frequency of eye exams.   Eat a healthy diet. Foods like vegetables, fruits, whole grains, low-fat dairy products, and lean protein foods contain the nutrients you need without too many calories. Decrease your intake of foods high in solid fats, added sugars, and salt. Eat the right amount of calories for you.Get information about a proper diet from your caregiver, if necessary.   Regular physical exercise is one of the most important things you can do for your health. Most adults should get at least 150 minutes of moderate-intensity exercise (any activity that increases your heart rate and causes you to sweat) each week. In addition, most adults need muscle-strengthening exercises on 2 or more days a week.   Maintain a healthy weight. The body mass index (BMI) is a screening tool to identify possible weight problems. It provides an estimate of body fat based on height and weight. Your caregiver can help determine your BMI, and can help you achieve or maintain a healthy weight.For adults 20 years and older:   A BMI below 18.5 is considered underweight.   A BMI of 18.5 to 24.9 is normal.   A BMI of 25 to 29.9 is considered overweight.   A BMI of 30 and above is  considered obese.   Maintain normal blood lipids and cholesterol levels by exercising and minimizing your intake of saturated fat. Eat a balanced diet with plenty of fruit and vegetables. Blood tests for lipids and cholesterol should begin at age 20 and be repeated every 5 years. If your lipid or cholesterol levels are high, you are over 50, or you are at high risk for heart disease, you may need your cholesterol levels checked more frequently.Ongoing high lipid and cholesterol levels should be treated with medicines if diet and exercise are not effective.   If you smoke, find out from your caregiver how to quit. If you do not use tobacco, do not start.   If you are pregnant, do not drink alcohol. If you are breastfeeding, be very cautious about drinking alcohol. If you are not pregnant and choose to drink alcohol, do not exceed 1 drink per day. One drink is considered to be 12 ounces (355 mL) of beer, 5 ounces (148 mL) of wine, or 1.5 ounces (44 mL) of liquor.   Avoid use of street drugs. Do not share needles with anyone. Ask for help if you need support or instructions about stopping the use of drugs.   High blood pressure causes heart disease and increases the risk of stroke. Your blood pressure should be checked at least every 1 to 2 years. Ongoing high blood pressure should be treated with medicines if weight loss and exercise are not effective.   If you are 55 to 54   years old, ask your caregiver if you should take aspirin to prevent strokes.   Diabetes screening involves taking a blood sample to check your fasting blood sugar level. This should be done once every 3 years, after age 45, if you are within normal weight and without risk factors for diabetes. Testing should be considered at a younger age or be carried out more frequently if you are overweight and have at least 1 risk factor for diabetes.   Breast cancer screening is essential preventive care for women. You should practice "breast  self-awareness." This means understanding the normal appearance and feel of your breasts and may include breast self-examination. Any changes detected, no matter how small, should be reported to a caregiver. Women in their 20s and 30s should have a clinical breast exam (CBE) by a caregiver as part of a regular health exam every 1 to 3 years. After age 40, women should have a CBE every year. Starting at age 40, women should consider having a mammography (breast X-ray test) every year. Women who have a family history of breast cancer should talk to their caregiver about genetic screening. Women at a high risk of breast cancer should talk to their caregivers about having magnetic resonance imaging (MRI) and a mammography every year.   The Pap test is a screening test for cervical cancer. A Pap test can show cell changes on the cervix that might become cervical cancer if left untreated. A Pap test is a procedure in which cells are obtained and examined from the lower end of the uterus (cervix).   Women should have a Pap test starting at age 21.   Between ages 21 and 29, Pap tests should be repeated every 2 years.   Beginning at age 30, you should have a Pap test every 3 years as long as the past 3 Pap tests have been normal.   Some women have medical problems that increase the chance of getting cervical cancer. Talk to your caregiver about these problems. It is especially important to talk to your caregiver if a new problem develops soon after your last Pap test. In these cases, your caregiver may recommend more frequent screening and Pap tests.   The above recommendations are the same for women who have or have not gotten the vaccine for human papillomavirus (HPV).   If you had a hysterectomy for a problem that was not cancer or a condition that could lead to cancer, then you no longer need Pap tests. Even if you no longer need a Pap test, a regular exam is a good idea to make sure no other problems are  starting.   If you are between ages 65 and 70, and you have had normal Pap tests going back 10 years, you no longer need Pap tests. Even if you no longer need a Pap test, a regular exam is a good idea to make sure no other problems are starting.   If you have had past treatment for cervical cancer or a condition that could lead to cancer, you need Pap tests and screening for cancer for at least 20 years after your treatment.   If Pap tests have been discontinued, risk factors (such as a new sexual partner) need to be reassessed to determine if screening should be resumed.   The HPV test is an additional test that may be used for cervical cancer screening. The HPV test looks for the virus that can cause the cell changes on the cervix.   The cells collected during the Pap test can be tested for HPV. The HPV test could be used to screen women aged 30 years and older, and should be used in women of any age who have unclear Pap test results. After the age of 30, women should have HPV testing at the same frequency as a Pap test.   Colorectal cancer can be detected and often prevented. Most routine colorectal cancer screening begins at the age of 50 and continues through age 75. However, your caregiver may recommend screening at an earlier age if you have risk factors for colon cancer. On a yearly basis, your caregiver may provide home test kits to check for hidden blood in the stool. Use of a small camera at the end of a tube, to directly examine the colon (sigmoidoscopy or colonoscopy), can detect the earliest forms of colorectal cancer. Talk to your caregiver about this at age 50, when routine screening begins. Direct examination of the colon should be repeated every 5 to 10 years through age 75, unless early forms of pre-cancerous polyps or small growths are found.   Hepatitis C blood testing is recommended for all people born from 1945 through 1965 and any individual with known risks for hepatitis C.    Practice safe sex. Use condoms and avoid high-risk sexual practices to reduce the spread of sexually transmitted infections (STIs). STIs include gonorrhea, chlamydia, syphilis, trichomonas, herpes, HPV, and human immunodeficiency virus (HIV). Herpes, HIV, and HPV are viral illnesses that have no cure. They can result in disability, cancer, and death. Sexually active women aged 25 and younger should be checked for chlamydia. Older women with new or multiple partners should also be tested for chlamydia. Testing for other STIs is recommended if you are sexually active and at increased risk.   Osteoporosis is a disease in which the bones lose minerals and strength with aging. This can result in serious bone fractures. The risk of osteoporosis can be identified using a bone density scan. Women ages 65 and over and women at risk for fractures or osteoporosis should discuss screening with their caregivers. Ask your caregiver whether you should take a calcium supplement or vitamin D to reduce the rate of osteoporosis.   Menopause can be associated with physical symptoms and risks. Hormone replacement therapy is available to decrease symptoms and risks. You should talk to your caregiver about whether hormone replacement therapy is right for you.   Use sunscreen with sun protection factor (SPF) of 30 or more. Apply sunscreen liberally and repeatedly throughout the day. You should seek shade when your shadow is shorter than you. Protect yourself by wearing long sleeves, pants, a wide-brimmed hat, and sunglasses year round, whenever you are outdoors.   Once a month, do a whole body skin exam, using a mirror to look at the skin on your back. Notify your caregiver of new moles, moles that have irregular borders, moles that are larger than a pencil eraser, or moles that have changed in shape or color.   Stay current with required immunizations.   Influenza. You need a dose every fall (or winter). The composition of  the flu vaccine changes each year, so being vaccinated once is not enough.   Pneumococcal polysaccharide. You need 1 to 2 doses if you smoke cigarettes or if you have certain chronic medical conditions. You need 1 dose at age 65 (or older) if you have never been vaccinated.   Tetanus, diphtheria, pertussis (Tdap, Td). Get 1 dose of   Tdap vaccine if you are younger than age 65, are over 65 and have contact with an infant, are a healthcare worker, are pregnant, or simply want to be protected from whooping cough. After that, you need a Td booster dose every 10 years. Consult your caregiver if you have not had at least 3 tetanus and diphtheria-containing shots sometime in your life or have a deep or dirty wound.   HPV. You need this vaccine if you are a woman age 26 or younger. The vaccine is given in 3 doses over 6 months.   Measles, mumps, rubella (MMR). You need at least 1 dose of MMR if you were born in 1957 or later. You may also need a second dose.   Meningococcal. If you are age 19 to 21 and a first-year college student living in a residence hall, or have one of several medical conditions, you need to get vaccinated against meningococcal disease. You may also need additional booster doses.   Zoster (shingles). If you are age 60 or older, you should get this vaccine.   Varicella (chickenpox). If you have never had chickenpox or you were vaccinated but received only 1 dose, talk to your caregiver to find out if you need this vaccine.   Hepatitis A. You need this vaccine if you have a specific risk factor for hepatitis A virus infection or you simply wish to be protected from this disease. The vaccine is usually given as 2 doses, 6 to 18 months apart.   Hepatitis B. You need this vaccine if you have a specific risk factor for hepatitis B virus infection or you simply wish to be protected from this disease. The vaccine is given in 3 doses, usually over 6 months.  Preventive Services /  Frequency Ages 19 to 39  Blood pressure check.** / Every 1 to 2 years.   Lipid and cholesterol check.** / Every 5 years beginning at age 20.   Clinical breast exam.** / Every 3 years for women in their 20s and 30s.   Pap test.** / Every 2 years from ages 21 through 29. Every 3 years starting at age 30 through age 65 or 70 with a history of 3 consecutive normal Pap tests.   HPV screening.** / Every 3 years from ages 30 through ages 65 to 70 with a history of 3 consecutive normal Pap tests.   Hepatitis C blood test.** / For any individual with known risks for hepatitis C.   Skin self-exam. / Monthly.   Influenza immunization.** / Every year.   Pneumococcal polysaccharide immunization.** / 1 to 2 doses if you smoke cigarettes or if you have certain chronic medical conditions.   Tetanus, diphtheria, pertussis (Tdap, Td) immunization. / A one-time dose of Tdap vaccine. After that, you need a Td booster dose every 10 years.   HPV immunization. / 3 doses over 6 months, if you are 26 and younger.   Measles, mumps, rubella (MMR) immunization. / You need at least 1 dose of MMR if you were born in 1957 or later. You may also need a second dose.   Meningococcal immunization. / 1 dose if you are age 19 to 21 and a first-year college student living in a residence hall, or have one of several medical conditions, you need to get vaccinated against meningococcal disease. You may also need additional booster doses.   Varicella immunization.** / Consult your caregiver.   Hepatitis A immunization.** / Consult your caregiver. 2 doses, 6 to 18 months   apart.   Hepatitis B immunization.** / Consult your caregiver. 3 doses usually over 6 months.  Ages 40 to 64  Blood pressure check.** / Every 1 to 2 years.   Lipid and cholesterol check.** / Every 5 years beginning at age 20.   Clinical breast exam.** / Every year after age 40.   Mammogram.** / Every year beginning at age 40 and continuing for as  long as you are in good health. Consult with your caregiver.   Pap test.** / Every 3 years starting at age 30 through age 65 or 70 with a history of 3 consecutive normal Pap tests.   HPV screening.** / Every 3 years from ages 30 through ages 65 to 70 with a history of 3 consecutive normal Pap tests.   Fecal occult blood test (FOBT) of stool. / Every year beginning at age 50 and continuing until age 75. You may not need to do this test if you get a colonoscopy every 10 years.   Flexible sigmoidoscopy or colonoscopy.** / Every 5 years for a flexible sigmoidoscopy or every 10 years for a colonoscopy beginning at age 50 and continuing until age 75.   Hepatitis C blood test.** / For all people born from 1945 through 1965 and any individual with known risks for hepatitis C.   Skin self-exam. / Monthly.   Influenza immunization.** / Every year.   Pneumococcal polysaccharide immunization.** / 1 to 2 doses if you smoke cigarettes or if you have certain chronic medical conditions.   Tetanus, diphtheria, pertussis (Tdap, Td) immunization.** / A one-time dose of Tdap vaccine. After that, you need a Td booster dose every 10 years.   Measles, mumps, rubella (MMR) immunization. / You need at least 1 dose of MMR if you were born in 1957 or later. You may also need a second dose.   Varicella immunization.** / Consult your caregiver.   Meningococcal immunization.** / Consult your caregiver.   Hepatitis A immunization.** / Consult your caregiver. 2 doses, 6 to 18 months apart.   Hepatitis B immunization.** / Consult your caregiver. 3 doses, usually over 6 months.  Ages 65 and over  Blood pressure check.** / Every 1 to 2 years.   Lipid and cholesterol check.** / Every 5 years beginning at age 20.   Clinical breast exam.** / Every year after age 40.   Mammogram.** / Every year beginning at age 40 and continuing for as long as you are in good health. Consult with your caregiver.   Pap test.** /  Every 3 years starting at age 30 through age 65 or 70 with a 3 consecutive normal Pap tests. Testing can be stopped between 65 and 70 with 3 consecutive normal Pap tests and no abnormal Pap or HPV tests in the past 10 years.   HPV screening.** / Every 3 years from ages 30 through ages 65 or 70 with a history of 3 consecutive normal Pap tests. Testing can be stopped between 65 and 70 with 3 consecutive normal Pap tests and no abnormal Pap or HPV tests in the past 10 years.   Fecal occult blood test (FOBT) of stool. / Every year beginning at age 50 and continuing until age 75. You may not need to do this test if you get a colonoscopy every 10 years.   Flexible sigmoidoscopy or colonoscopy.** / Every 5 years for a flexible sigmoidoscopy or every 10 years for a colonoscopy beginning at age 50 and continuing until age 75.   Hepatitis   C blood test.** / For all people born from 1945 through 1965 and any individual with known risks for hepatitis C.   Osteoporosis screening.** / A one-time screening for women ages 65 and over and women at risk for fractures or osteoporosis.   Skin self-exam. / Monthly.   Influenza immunization.** / Every year.   Pneumococcal polysaccharide immunization.** / 1 dose at age 65 (or older) if you have never been vaccinated.   Tetanus, diphtheria, pertussis (Tdap, Td) immunization. / A one-time dose of Tdap vaccine if you are over 65 and have contact with an infant, are a healthcare worker, or simply want to be protected from whooping cough. After that, you need a Td booster dose every 10 years.   Varicella immunization.** / Consult your caregiver.   Meningococcal immunization.** / Consult your caregiver.   Hepatitis A immunization.** / Consult your caregiver. 2 doses, 6 to 18 months apart.   Hepatitis B immunization.** / Check with your caregiver. 3 doses, usually over 6 months.  ** Family history and personal history of risk and conditions may change your caregiver's  recommendations. Document Released: 03/04/2001 Document Revised: 12/26/2010 Document Reviewed: 06/03/2010 [employer] Patient Information 2012 [employer]. 

## 2011-09-18 NOTE — Assessment & Plan Note (Signed)
Check labs 

## 2011-09-18 NOTE — Progress Notes (Signed)
Subjective:     Nicole Richard is a 56 y.o. female and is here for a comprehensive physical exam. The patient reports no problems.  History   Social History  . Marital Status: Legally Separated    Spouse Name: N/A    Number of Children: N/A  . Years of Education: N/A   Occupational History  . transitional care [employer]   Social History Main Topics  . Smoking status: Never Smoker   . Smokeless tobacco: Never Used  . Alcohol Use: Yes     rare  . Drug Use: No  . Sexually Active: Not Currently -- Female partner(s)   Other Topics Concern  . Not on file   Social History Narrative  . No narrative on file   Health Maintenance  Topic Date Due  . Influenza Vaccine  10/21/2011  . Tetanus/tdap  11/30/2011  . Mammogram  09/04/2013  . Pap Smear  09/18/2014  . Colonoscopy  08/16/2017    The following portions of the patient's history were reviewed and updated as appropriate: allergies, current medications, past family history, past medical history, past social history, past surgical history and problem list.  Review of Systems Review of Systems  Constitutional: Negative for activity change, appetite change and fatigue.  HENT: Negative for hearing loss, congestion, tinnitus and ear discharge.  dentist q29m Eyes: Negative for visual disturbance (see optho q1y -- vision corrected to 20/20 with glasses).  Respiratory: Negative for cough, chest tightness and shortness of breath.   Cardiovascular: Negative for chest pain, palpitations and leg swelling.  Gastrointestinal: Negative for abdominal pain, diarrhea, constipation and abdominal distention.  Genitourinary: Negative for urgency, frequency, decreased urine volume and difficulty urinating.  Musculoskeletal: Negative for back pain, arthralgias and gait problem.  Skin: Negative for color change, pallor and rash.  Neurological: Negative for dizziness, light-headedness, numbness and headaches.  Hematological: Negative  for adenopathy. Does not bruise/bleed easily.  Psychiatric/Behavioral: Negative for suicidal ideas, confusion, sleep disturbance, self-injury, dysphoric mood, decreased concentration and agitation.       Objective:    BP 116/74  Pulse 62  Temp 98.5 F (36.9 C) (Oral)  Ht 5' (1.524 m)  Wt 156 lb 12.8 oz (71.124 kg)  BMI 30.62 kg/m2  SpO2 95% General appearance: alert, cooperative, appears stated age and no distress Head: Normocephalic, without obvious abnormality, atraumatic Eyes: negative findings: lids and lashes normal, conjunctivae and sclerae normal, corneas clear and pupils equal, round, reactive to light and accomodation Ears: normal TM's and external ear canals both ears Nose: Nares normal. Septum midline. Mucosa normal. No drainage or sinus tenderness. Throat: lips, mucosa, and tongue normal; teeth and gums normal Neck: no adenopathy, no carotid bruit, no JVD, supple, symmetrical, trachea midline and thyroid not enlarged, symmetric, no tenderness/mass/nodules Back: symmetric, no curvature. ROM normal. No CVA tenderness. Lungs: clear to auscultation bilaterally Breasts: normal appearance, no masses or tenderness--implants Heart: regular rate and rhythm, S1, S2 normal, no murmur, click, rub or gallop Abdomen: soft, non-tender; bowel sounds normal; no masses,  no organomegaly Pelvic: cervix normal in appearance, external genitalia normal, no adnexal masses or tenderness, no cervical motion tenderness, rectovaginal septum normal, uterus normal size, shape, and consistency and vagina normal without discharge Extremities: extremities normal, atraumatic, no cyanosis or edema Pulses: 2+ and symmetric Skin: Skin color, texture, turgor normal. No rashes or lesions Lymph nodes: Cervical, supraclavicular, and axillary nodes normal. Neurologic: Alert and oriented X 3, normal strength and tone. Normal symmetric reflexes. Normal coordination and  gait psych-- no depression, anxiety      Assessment:    Healthy female exam.      Plan:    ghm utd Check labs See After Visit Summary for Counseling Recommendations

## 2011-09-18 NOTE — Assessment & Plan Note (Signed)
Stable con't meds 

## 2011-09-18 NOTE — Addendum Note (Signed)
Addended by: Arnette Norris on: 09/18/2011 09:41 AM   Modules accepted: Orders

## 2011-09-24 ENCOUNTER — Telehealth: Payer: Self-pay | Admitting: *Deleted

## 2011-09-24 NOTE — Telephone Encounter (Signed)
i thought Combivent was changing its inhaler system but not being discontinued.  Will defer to PCP on changes

## 2011-09-24 NOTE — Telephone Encounter (Signed)
combivent respimat  1 inh qid   #1   3 refills

## 2011-09-25 ENCOUNTER — Other Ambulatory Visit: Payer: Self-pay | Admitting: Family Medicine

## 2011-09-25 MED ORDER — IPRATROPIUM-ALBUTEROL 20-100 MCG/ACT IN AERS: 1.0000 | INHALATION_SPRAY | Freq: Four times a day (QID) | RESPIRATORY_TRACT | Status: DC

## 2011-09-25 NOTE — Telephone Encounter (Signed)
Rx called in to pharmacy. 

## 2012-08-04 ENCOUNTER — Other Ambulatory Visit: Payer: Self-pay | Admitting: Family Medicine

## 2012-08-23 ENCOUNTER — Other Ambulatory Visit: Payer: Self-pay

## 2012-08-23 DIAGNOSIS — Z1231 Encounter for screening mammogram for malignant neoplasm of breast: Secondary | ICD-10-CM

## 2012-09-06 ENCOUNTER — Ambulatory Visit
Admission: RE | Admit: 2012-09-06 | Discharge: 2012-09-06 | Disposition: A | Discharge status: Home / Self Care | Payer: Managed Care, Other (non HMO) | Source: Ambulatory Visit

## 2012-09-06 DIAGNOSIS — Z1231 Encounter for screening mammogram for malignant neoplasm of breast: Secondary | ICD-10-CM

## 2012-10-15 ENCOUNTER — Telehealth: Payer: Self-pay

## 2012-10-15 NOTE — Telephone Encounter (Signed)
LM for CB  HM UTD WE flu vaccine and Tdap

## 2012-10-18 ENCOUNTER — Ambulatory Visit (INDEPENDENT_AMBULATORY_CARE_PROVIDER_SITE_OTHER): Payer: Managed Care, Other (non HMO) | Admitting: Family Medicine

## 2012-10-18 ENCOUNTER — Encounter: Payer: Self-pay | Admitting: Family Medicine

## 2012-10-18 VITALS — BP 122/78 | HR 63 | Temp 98.3°F | Ht 60.5 in | Wt 176.2 lb

## 2012-10-18 DIAGNOSIS — I10 Essential (primary) hypertension: Secondary | ICD-10-CM

## 2012-10-18 DIAGNOSIS — Z23 Encounter for immunization: Secondary | ICD-10-CM

## 2012-10-18 DIAGNOSIS — Z0000 Encounter for general adult medical examination without abnormal findings: Secondary | ICD-10-CM

## 2012-10-18 DIAGNOSIS — K219 Gastro-esophageal reflux disease without esophagitis: Secondary | ICD-10-CM

## 2012-10-18 DIAGNOSIS — J309 Allergic rhinitis, unspecified: Secondary | ICD-10-CM

## 2012-10-18 DIAGNOSIS — E669 Obesity, unspecified: Secondary | ICD-10-CM

## 2012-10-18 DIAGNOSIS — E785 Hyperlipidemia, unspecified: Secondary | ICD-10-CM

## 2012-10-18 DIAGNOSIS — J302 Other seasonal allergic rhinitis: Secondary | ICD-10-CM

## 2012-10-18 DIAGNOSIS — J329 Chronic sinusitis, unspecified: Secondary | ICD-10-CM

## 2012-10-18 DIAGNOSIS — J45909 Unspecified asthma, uncomplicated: Secondary | ICD-10-CM

## 2012-10-18 HISTORY — DX: Obesity, unspecified: E66.9

## 2012-10-18 LAB — LIPID PANEL
HDL: 50.1 mg/dL (ref >39.00)
LDL Cholesterol: 87 mg/dL (ref 0–99)
Total CHOL/HDL Ratio: 3
Triglycerides: 139 mg/dL (ref 0.0–149.0)

## 2012-10-18 LAB — CBC WITH DIFFERENTIAL/PLATELET
Basophils Relative: 0.5 % (ref 0.0–3.0)
Eosinophils Relative: 3.4 % (ref 0.0–5.0)
HCT: 41.7 % (ref 36.0–46.0)
Hemoglobin: 14 g/dL (ref 12.0–15.0)
Lymphocytes Relative: 31.4 % (ref 12.0–46.0)
Lymphs Abs: 2 10*3/uL (ref 0.7–4.0)
Monocytes Relative: 6 % (ref 3.0–12.0)
Neutro Abs: 3.8 10*3/uL (ref 1.4–7.7)
Platelets: 262 10*3/uL (ref 150.0–400.0)
RBC: 4.69 Mil/uL (ref 3.87–5.11)
RDW: 12.9 % (ref 11.5–14.6)
WBC: 6.4 10*3/uL (ref 4.5–10.5)

## 2012-10-18 LAB — BASIC METABOLIC PANEL
Calcium: 9.3 mg/dL (ref 8.4–10.5)
Chloride: 106 mEq/L (ref 96–112)
GFR: 106.12 mL/min (ref >60.00)
Potassium: 4.1 mEq/L (ref 3.5–5.1)
Sodium: 140 mEq/L (ref 135–145)

## 2012-10-18 LAB — HEPATIC FUNCTION PANEL
Albumin: 4.1 g/dL (ref 3.5–5.2)
Bilirubin, Direct: 0 mg/dL (ref 0.0–0.3)
Total Bilirubin: 1 mg/dL (ref 0.3–1.2)
Total Protein: 7.2 g/dL (ref 6.0–8.3)

## 2012-10-18 MED ORDER — IPRATROPIUM-ALBUTEROL 20-100 MCG/ACT IN AERS: 1.0000 | INHALATION_SPRAY | Freq: Four times a day (QID) | RESPIRATORY_TRACT | Status: DC

## 2012-10-18 MED ORDER — MOMETASONE FUROATE 50 MCG/ACT NA SUSP: 2.0000 | Freq: Every day | NASAL | Status: DC

## 2012-10-18 MED ORDER — MONTELUKAST SODIUM 10 MG PO TABS: ORAL_TABLET | ORAL | Status: DC

## 2012-10-18 MED ORDER — ATENOLOL 25 MG PO TABS: ORAL_TABLET | ORAL | Status: DC

## 2012-10-18 MED ORDER — ESOMEPRAZOLE MAGNESIUM 40 MG PO CPDR: 40.0000 mg | DELAYED_RELEASE_CAPSULE | Freq: Every day | ORAL | Status: DC

## 2012-10-18 NOTE — Patient Instructions (Addendum)
Preventive Care for Adults, Female A healthy lifestyle and preventive care can promote health and wellness. Preventive health guidelines for women include the following key practices.  A routine yearly physical is a good way to check with your caregiver about your health and preventive screening. It is a chance to share any concerns and updates on your health, and to receive a thorough exam.  Visit your dentist for a routine exam and preventive care every 6 months. Brush your teeth twice a day and floss once a day. Good oral hygiene prevents tooth decay and gum disease.  The frequency of eye exams is based on your age, health, family medical history, use of contact lenses, and other factors. Follow your caregiver's recommendations for frequency of eye exams.  Eat a healthy diet. Foods like vegetables, fruits, whole grains, low-fat dairy products, and lean protein foods contain the nutrients you need without too many calories. Decrease your intake of foods high in solid fats, added sugars, and salt. Eat the right amount of calories for you.Get information about a proper diet from your caregiver, if necessary.  Regular physical exercise is one of the most important things you can do for your health. Most adults should get at least 150 minutes of moderate-intensity exercise (any activity that increases your heart rate and causes you to sweat) each week. In addition, most adults need muscle-strengthening exercises on 2 or more days a week.  Maintain a healthy weight. The body mass index (BMI) is a screening tool to identify possible weight problems. It provides an estimate of body fat based on height and weight. Your caregiver can help determine your BMI, and can help you achieve or maintain a healthy weight.For adults 20 years and older:  A BMI below 18.5 is considered underweight.  A BMI of 18.5 to 24.9 is normal.  A BMI of 25 to 29.9 is considered overweight.  A BMI of 30 and above is  considered obese.  Maintain normal blood lipids and cholesterol levels by exercising and minimizing your intake of saturated fat. Eat a balanced diet with plenty of fruit and vegetables. Blood tests for lipids and cholesterol should begin at age 20 and be repeated every 5 years. If your lipid or cholesterol levels are high, you are over 50, or you are at high risk for heart disease, you may need your cholesterol levels checked more frequently.Ongoing high lipid and cholesterol levels should be treated with medicines if diet and exercise are not effective.  If you smoke, find out from your caregiver how to quit. If you do not use tobacco, do not start.  If you are pregnant, do not drink alcohol. If you are breastfeeding, be very cautious about drinking alcohol. If you are not pregnant and choose to drink alcohol, do not exceed 1 drink per day. One drink is considered to be 12 ounces (355 mL) of beer, 5 ounces (148 mL) of wine, or 1.5 ounces (44 mL) of liquor.  Avoid use of street drugs. Do not share needles with anyone. Ask for help if you need support or instructions about stopping the use of drugs.  High blood pressure causes heart disease and increases the risk of stroke. Your blood pressure should be checked at least every 1 to 2 years. Ongoing high blood pressure should be treated with medicines if weight loss and exercise are not effective.  If you are 55 to 55 years old, ask your caregiver if you should take aspirin to prevent strokes.  Diabetes   screening involves taking a blood sample to check your fasting blood sugar level. This should be done once every 3 years, after age 45, if you are within normal weight and without risk factors for diabetes. Testing should be considered at a younger age or be carried out more frequently if you are overweight and have at least 1 risk factor for diabetes.  Breast cancer screening is essential preventive care for women. You should practice "breast  self-awareness." This means understanding the normal appearance and feel of your breasts and may include breast self-examination. Any changes detected, no matter how small, should be reported to a caregiver. Women in their 20s and 30s should have a clinical breast exam (CBE) by a caregiver as part of a regular health exam every 1 to 3 years. After age 40, women should have a CBE every year. Starting at age 40, women should consider having a mammography (breast X-ray test) every year. Women who have a family history of breast cancer should talk to their caregiver about genetic screening. Women at a high risk of breast cancer should talk to their caregivers about having magnetic resonance imaging (MRI) and a mammography every year.  The Pap test is a screening test for cervical cancer. A Pap test can show cell changes on the cervix that might become cervical cancer if left untreated. A Pap test is a procedure in which cells are obtained and examined from the lower end of the uterus (cervix).  Women should have a Pap test starting at age 21.  Between ages 21 and 29, Pap tests should be repeated every 2 years.  Beginning at age 30, you should have a Pap test every 3 years as long as the past 3 Pap tests have been normal.  Some women have medical problems that increase the chance of getting cervical cancer. Talk to your caregiver about these problems. It is especially important to talk to your caregiver if a new problem develops soon after your last Pap test. In these cases, your caregiver may recommend more frequent screening and Pap tests.  The above recommendations are the same for women who have or have not gotten the vaccine for human papillomavirus (HPV).  If you had a hysterectomy for a problem that was not cancer or a condition that could lead to cancer, then you no longer need Pap tests. Even if you no longer need a Pap test, a regular exam is a good idea to make sure no other problems are  starting.  If you are between ages 65 and 70, and you have had normal Pap tests going back 10 years, you no longer need Pap tests. Even if you no longer need a Pap test, a regular exam is a good idea to make sure no other problems are starting.  If you have had past treatment for cervical cancer or a condition that could lead to cancer, you need Pap tests and screening for cancer for at least 20 years after your treatment.  If Pap tests have been discontinued, risk factors (such as a new sexual partner) need to be reassessed to determine if screening should be resumed.  The HPV test is an additional test that may be used for cervical cancer screening. The HPV test looks for the virus that can cause the cell changes on the cervix. The cells collected during the Pap test can be tested for HPV. The HPV test could be used to screen women aged 30 years and older, and should   be used in women of any age who have unclear Pap test results. After the age of 30, women should have HPV testing at the same frequency as a Pap test.  Colorectal cancer can be detected and often prevented. Most routine colorectal cancer screening begins at the age of 50 and continues through age 75. However, your caregiver may recommend screening at an earlier age if you have risk factors for colon cancer. On a yearly basis, your caregiver may provide home test kits to check for hidden blood in the stool. Use of a small camera at the end of a tube, to directly examine the colon (sigmoidoscopy or colonoscopy), can detect the earliest forms of colorectal cancer. Talk to your caregiver about this at age 50, when routine screening begins. Direct examination of the colon should be repeated every 5 to 10 years through age 75, unless early forms of pre-cancerous polyps or small growths are found.  Hepatitis C blood testing is recommended for all people born from 1945 through 1965 and any individual with known risks for hepatitis C.  Practice  safe sex. Use condoms and avoid high-risk sexual practices to reduce the spread of sexually transmitted infections (STIs). STIs include gonorrhea, chlamydia, syphilis, trichomonas, herpes, HPV, and human immunodeficiency virus (HIV). Herpes, HIV, and HPV are viral illnesses that have no cure. They can result in disability, cancer, and death. Sexually active women aged 25 and younger should be checked for chlamydia. Older women with new or multiple partners should also be tested for chlamydia. Testing for other STIs is recommended if you are sexually active and at increased risk.  Osteoporosis is a disease in which the bones lose minerals and strength with aging. This can result in serious bone fractures. The risk of osteoporosis can be identified using a bone density scan. Women ages 65 and over and women at risk for fractures or osteoporosis should discuss screening with their caregivers. Ask your caregiver whether you should take a calcium supplement or vitamin D to reduce the rate of osteoporosis.  Menopause can be associated with physical symptoms and risks. Hormone replacement therapy is available to decrease symptoms and risks. You should talk to your caregiver about whether hormone replacement therapy is right for you.  Use sunscreen with sun protection factor (SPF) of 30 or more. Apply sunscreen liberally and repeatedly throughout the day. You should seek shade when your shadow is shorter than you. Protect yourself by wearing long sleeves, pants, a wide-brimmed hat, and sunglasses year round, whenever you are outdoors.  Once a month, do a whole body skin exam, using a mirror to look at the skin on your back. Notify your caregiver of new moles, moles that have irregular borders, moles that are larger than a pencil eraser, or moles that have changed in shape or color.  Stay current with required immunizations.  Influenza. You need a dose every fall (or winter). The composition of the flu vaccine  changes each year, so being vaccinated once is not enough.  Pneumococcal polysaccharide. You need 1 to 2 doses if you smoke cigarettes or if you have certain chronic medical conditions. You need 1 dose at age 65 (or older) if you have never been vaccinated.  Tetanus, diphtheria, pertussis (Tdap, Td). Get 1 dose of Tdap vaccine if you are younger than age 65, are over 65 and have contact with an infant, are a healthcare worker, are pregnant, or simply want to be protected from whooping cough. After that, you need a Td   booster dose every 10 years. Consult your caregiver if you have not had at least 3 tetanus and diphtheria-containing shots sometime in your life or have a deep or dirty wound.  HPV. You need this vaccine if you are a woman age 26 or younger. The vaccine is given in 3 doses over 6 months.  Measles, mumps, rubella (MMR). You need at least 1 dose of MMR if you were born in 1957 or later. You may also need a second dose.  Meningococcal. If you are age 19 to 21 and a first-year college student living in a residence hall, or have one of several medical conditions, you need to get vaccinated against meningococcal disease. You may also need additional booster doses.  Zoster (shingles). If you are age 60 or older, you should get this vaccine.  Varicella (chickenpox). If you have never had chickenpox or you were vaccinated but received only 1 dose, talk to your caregiver to find out if you need this vaccine.  Hepatitis A. You need this vaccine if you have a specific risk factor for hepatitis A virus infection or you simply wish to be protected from this disease. The vaccine is usually given as 2 doses, 6 to 18 months apart.  Hepatitis B. You need this vaccine if you have a specific risk factor for hepatitis B virus infection or you simply wish to be protected from this disease. The vaccine is given in 3 doses, usually over 6 months. Preventive Services / Frequency Ages 19 to 39  Blood  pressure check.** / Every 1 to 2 years.  Lipid and cholesterol check.** / Every 5 years beginning at age 20.  Clinical breast exam.** / Every 3 years for women in their 20s and 30s.  Pap test.** / Every 2 years from ages 21 through 29. Every 3 years starting at age 30 through age 65 or 70 with a history of 3 consecutive normal Pap tests.  HPV screening.** / Every 3 years from ages 30 through ages 65 to 70 with a history of 3 consecutive normal Pap tests.  Hepatitis C blood test.** / For any individual with known risks for hepatitis C.  Skin self-exam. / Monthly.  Influenza immunization.** / Every year.  Pneumococcal polysaccharide immunization.** / 1 to 2 doses if you smoke cigarettes or if you have certain chronic medical conditions.  Tetanus, diphtheria, pertussis (Tdap, Td) immunization. / A one-time dose of Tdap vaccine. After that, you need a Td booster dose every 10 years.  HPV immunization. / 3 doses over 6 months, if you are 26 and younger.  Measles, mumps, rubella (MMR) immunization. / You need at least 1 dose of MMR if you were born in 1957 or later. You may also need a second dose.  Meningococcal immunization. / 1 dose if you are age 19 to 21 and a first-year college student living in a residence hall, or have one of several medical conditions, you need to get vaccinated against meningococcal disease. You may also need additional booster doses.  Varicella immunization.** / Consult your caregiver.  Hepatitis A immunization.** / Consult your caregiver. 2 doses, 6 to 18 months apart.  Hepatitis B immunization.** / Consult your caregiver. 3 doses usually over 6 months. Ages 40 to 64  Blood pressure check.** / Every 1 to 2 years.  Lipid and cholesterol check.** / Every 5 years beginning at age 20.  Clinical breast exam.** / Every year after age 40.  Mammogram.** / Every year beginning at age 40   and continuing for as long as you are in good health. Consult with your  caregiver.  Pap test.** / Every 3 years starting at age 30 through age 65 or 70 with a history of 3 consecutive normal Pap tests.  HPV screening.** / Every 3 years from ages 30 through ages 65 to 70 with a history of 3 consecutive normal Pap tests.  Fecal occult blood test (FOBT) of stool. / Every year beginning at age 50 and continuing until age 75. You may not need to do this test if you get a colonoscopy every 10 years.  Flexible sigmoidoscopy or colonoscopy.** / Every 5 years for a flexible sigmoidoscopy or every 10 years for a colonoscopy beginning at age 50 and continuing until age 75.  Hepatitis C blood test.** / For all people born from 1945 through 1965 and any individual with known risks for hepatitis C.  Skin self-exam. / Monthly.  Influenza immunization.** / Every year.  Pneumococcal polysaccharide immunization.** / 1 to 2 doses if you smoke cigarettes or if you have certain chronic medical conditions.  Tetanus, diphtheria, pertussis (Tdap, Td) immunization.** / A one-time dose of Tdap vaccine. After that, you need a Td booster dose every 10 years.  Measles, mumps, rubella (MMR) immunization. / You need at least 1 dose of MMR if you were born in 1957 or later. You may also need a second dose.  Varicella immunization.** / Consult your caregiver.  Meningococcal immunization.** / Consult your caregiver.  Hepatitis A immunization.** / Consult your caregiver. 2 doses, 6 to 18 months apart.  Hepatitis B immunization.** / Consult your caregiver. 3 doses, usually over 6 months. Ages 65 and over  Blood pressure check.** / Every 1 to 2 years.  Lipid and cholesterol check.** / Every 5 years beginning at age 20.  Clinical breast exam.** / Every year after age 40.  Mammogram.** / Every year beginning at age 40 and continuing for as long as you are in good health. Consult with your caregiver.  Pap test.** / Every 3 years starting at age 30 through age 65 or 70 with a 3  consecutive normal Pap tests. Testing can be stopped between 65 and 70 with 3 consecutive normal Pap tests and no abnormal Pap or HPV tests in the past 10 years.  HPV screening.** / Every 3 years from ages 30 through ages 65 or 70 with a history of 3 consecutive normal Pap tests. Testing can be stopped between 65 and 70 with 3 consecutive normal Pap tests and no abnormal Pap or HPV tests in the past 10 years.  Fecal occult blood test (FOBT) of stool. / Every year beginning at age 50 and continuing until age 75. You may not need to do this test if you get a colonoscopy every 10 years.  Flexible sigmoidoscopy or colonoscopy.** / Every 5 years for a flexible sigmoidoscopy or every 10 years for a colonoscopy beginning at age 50 and continuing until age 75.  Hepatitis C blood test.** / For all people born from 1945 through 1965 and any individual with known risks for hepatitis C.  Osteoporosis screening.** / A one-time screening for women ages 65 and over and women at risk for fractures or osteoporosis.  Skin self-exam. / Monthly.  Influenza immunization.** / Every year.  Pneumococcal polysaccharide immunization.** / 1 dose at age 65 (or older) if you have never been vaccinated.  Tetanus, diphtheria, pertussis (Tdap, Td) immunization. / A one-time dose of Tdap vaccine if you are over   65 and have contact with an infant, are a healthcare worker, or simply want to be protected from whooping cough. After that, you need a Td booster dose every 10 years.  Varicella immunization.** / Consult your caregiver.  Meningococcal immunization.** / Consult your caregiver.  Hepatitis A immunization.** / Consult your caregiver. 2 doses, 6 to 18 months apart.  Hepatitis B immunization.** / Check with your caregiver. 3 doses, usually over 6 months. ** Family history and personal history of risk and conditions may change your caregiver's recommendations. Document Released: 03/04/2001 Document Revised: 03/31/2011  Document Reviewed: 06/03/2010 [employer] Patient Information 2014 [employer].  

## 2012-10-18 NOTE — Telephone Encounter (Signed)
Unable to contact prior to visit

## 2012-10-18 NOTE — Assessment & Plan Note (Signed)
Stable con't meds 

## 2012-10-18 NOTE — Progress Notes (Signed)
Subjective:     Nicole Richard is a 57 y.o. female and is here for a comprehensive physical exam. The patient reports problems - dry eye and pain in L eye.  History   Social History  . Marital Status: Divorced    Spouse Name: N/A    Number of Children: N/A  . Years of Education: N/A   Occupational History  . [employer]--LPN   .     Social History Main Topics  . Smoking status: Never Smoker   . Smokeless tobacco: Never Used  . Alcohol Use: Yes     Comment: rare  . Drug Use: No  . Sexual Activity: Not Currently    Partners: Male   Other Topics Concern  . Not on file   Social History Narrative   Exercise--no   Health Maintenance  Topic Date Due  . Influenza Vaccine  10/22/2012  . Mammogram  09/07/2014  . Pap Smear  09/18/2014  . Colonoscopy  08/16/2017  . Tetanus/tdap  10/19/2022    The following portions of the patient's history were reviewed and updated as appropriate:  She  has a past medical history of Hypertension and Hyperlipidemia. She  does not have any pertinent problems on file. She  has past surgical history that includes Tubal ligation; Appendectomy; Abdominal hysterectomy (08/2004); Tonsillectomy; and Breast enhancement surgery (2001). Her family history includes Asthma in an other family member; Breast cancer in her maternal grandmother; Cervical cancer in her maternal grandmother; Colon cancer in her maternal grandmother; Colon polyps in her father; Coronary artery disease in an other family member; Diabetes in her father; Hyperlipidemia in an other family member; Hypertension in an other family member; Irritable bowel syndrome in her mother; Kidney disease in her father; Rheumatic fever in her mother; Uterine cancer in her maternal grandmother. She  reports that she has never smoked. She has never used smokeless tobacco. She reports that  drinks alcohol. She reports that she does not use illicit drugs. She has a current medication list which includes  the following prescription(s): atenolol, atorvastatin, calcium carbonate, cholecalciferol, esomeprazole, fish oil, ipratropium-albuterol, mometasone, montelukast, NONFORMULARY OR COMPOUNDED ITEM, and propylene glycol. Current Outpatient Prescriptions on File Prior to Visit  Medication Sig Dispense Refill  . atenolol (TENORMIN) 25 MG tablet Take 1 tablet by mouth daily--Office visit due now  90 tablet  0  . atorvastatin (LIPITOR) 40 MG tablet Take 1 tablet (40 mg total) by mouth daily.  90 tablet  3  . calcium carbonate (TUMS EX) 750 MG chewable tablet Chew 2 tablets by mouth 2 (two) times daily.        . Cholecalciferol (VITAMIN D PO) Take 600 Units by mouth daily.        . Fish Oil OIL 1,000 mg by Does not apply route daily.        . Ipratropium-Albuterol (COMBIVENT RESPIMAT) 20-100 MCG/ACT AERS respimat Inhale 1 puff into the lungs 4 (four) times daily.  1 Inhaler  3  . mometasone (NASONEX) 50 MCG/ACT nasal spray Place 2 sprays into the nose daily.  17 g  2  . montelukast (SINGULAIR) 10 MG tablet TAKE 1 TABLET DAILY  90 tablet  0  . NONFORMULARY OR COMPOUNDED ITEM ambrien 1 po qd otc       No current facility-administered medications on file prior to visit.   She is allergic to contrast media; metamucil; and mustard seed..  Review of Systems Review of Systems  Constitutional: Negative for activity change,  appetite change and fatigue.  HENT: Negative for hearing loss, congestion, tinnitus and ear discharge.  dentist q66m Eyes: Negative for visual disturbance (see optho q1y -- vision 20/20).  Respiratory: Negative for cough, chest tightness and shortness of breath.   Cardiovascular: Negative for chest pain, palpitations and leg swelling.  Gastrointestinal: Negative for abdominal pain, diarrhea, constipation and abdominal distention.  Genitourinary: Negative for urgency, frequency, decreased urine volume and difficulty urinating.  Musculoskeletal: Negative for back pain, arthralgias and gait  problem.  Skin: Negative for color change, pallor and rash.  Neurological: Negative for dizziness, light-headedness, numbness and headaches.  Hematological: Negative for adenopathy. Does not bruise/bleed easily.  Psychiatric/Behavioral: Negative for suicidal ideas, confusion, sleep disturbance, self-injury, dysphoric mood, decreased concentration and agitation.       Objective:    BP 122/78  Pulse 63  Temp(Src) 98.3 F (36.8 C) (Oral)  Ht 5' 0.5" (1.537 m)  Wt 176 lb 3.2 oz (79.924 kg)  BMI 33.83 kg/m2  SpO2 96% General appearance: alert, cooperative, appears stated age and no distress Head: Normocephalic, without obvious abnormality, atraumatic Eyes: conjunctivae/corneas clear. PERRL, EOM's intact. Fundi benign. Ears: normal TM's and external ear canals both ears Nose: Nares normal. Septum midline. Mucosa normal. No drainage or sinus tenderness. Throat: lips, mucosa, and tongue normal; teeth and gums normal Neck: no adenopathy, no carotid bruit, no JVD, supple, symmetrical, trachea midline and thyroid not enlarged, symmetric, no tenderness/mass/nodules Back: symmetric, no curvature. ROM normal. No CVA tenderness. Lungs: clear to auscultation bilaterally Breasts: normal appearance, no masses or tenderness Heart: regular rate and rhythm, S1, S2 normal, no murmur, click, rub or gallop Abdomen: soft, non-tender; bowel sounds normal; no masses,  no organomegaly Pelvic: deferred Extremities: extremities normal, atraumatic, no cyanosis or edema Pulses: 2+ and symmetric Skin: Skin color, texture, turgor normal. No rashes or lesions Lymph nodes: Cervical, supraclavicular, and axillary nodes normal. Neurologic: Alert and oriented X 3, normal strength and tone. Normal symmetric reflexes. Normal coordination and gait psych-- no depression, no anxiety    Assessment:    Healthy female exam.      Plan:    check labs ghm utd See After Visit Summary for Counseling Recommendations

## 2012-10-18 NOTE — Assessment & Plan Note (Signed)
Discussed diet and exercise 

## 2012-10-18 NOTE — Assessment & Plan Note (Signed)
Check labs con't meds 

## 2012-10-19 LAB — POCT URINALYSIS DIPSTICK
Bilirubin, UA: NEGATIVE
Blood, UA: NEGATIVE
Ketones, UA: NEGATIVE
Nitrite, UA: NEGATIVE
Spec Grav, UA: >=1.03
pH, UA: 6

## 2012-10-25 ENCOUNTER — Other Ambulatory Visit (INDEPENDENT_AMBULATORY_CARE_PROVIDER_SITE_OTHER): Payer: Managed Care, Other (non HMO)

## 2012-10-25 LAB — HEPATIC FUNCTION PANEL
ALT: 47 U/L — ABNORMAL HIGH (ref 0–35)
Albumin: 4.1 g/dL (ref 3.5–5.2)
Alkaline Phosphatase: 130 U/L — ABNORMAL HIGH (ref 39–117)
Total Protein: 7.2 g/dL (ref 6.0–8.3)

## 2012-10-29 ENCOUNTER — Telehealth: Payer: Self-pay | Admitting: *Deleted

## 2012-10-29 NOTE — Telephone Encounter (Signed)
Pt was returning phone call from [facility] regarding her lab results. Pt was instructed to hold the liptor for 2 weeks and schedule a lab appointment to recheck labs. Patient scheduled appointment while on the phone. Pt also states that she would like printed prescriptions when comes in for her lab visit (11/12/2012). Pt states that she now has a new mail order pharmacy that requires her to have printed prescriptions

## 2012-11-12 ENCOUNTER — Other Ambulatory Visit (INDEPENDENT_AMBULATORY_CARE_PROVIDER_SITE_OTHER): Payer: Managed Care, Other (non HMO)

## 2012-11-12 DIAGNOSIS — R7402 Elevation of levels of lactic acid dehydrogenase (LDH): Secondary | ICD-10-CM

## 2012-11-12 LAB — HEPATIC FUNCTION PANEL
AST: 39 U/L — ABNORMAL HIGH (ref 0–37)
Albumin: 3.9 g/dL (ref 3.5–5.2)
Alkaline Phosphatase: 152 U/L — ABNORMAL HIGH (ref 39–117)
Total Bilirubin: 0.8 mg/dL (ref 0.3–1.2)

## 2012-11-12 NOTE — Addendum Note (Signed)
Addended by: Verdie Shire on: 11/12/2012 08:36 AM   Modules accepted: Orders

## 2012-11-17 ENCOUNTER — Other Ambulatory Visit: Payer: Self-pay | Admitting: *Deleted

## 2012-11-17 ENCOUNTER — Telehealth: Payer: Self-pay | Admitting: *Deleted

## 2012-11-17 DIAGNOSIS — E785 Hyperlipidemia, unspecified: Secondary | ICD-10-CM

## 2012-11-17 DIAGNOSIS — J45909 Unspecified asthma, uncomplicated: Secondary | ICD-10-CM

## 2012-11-17 DIAGNOSIS — J302 Other seasonal allergic rhinitis: Secondary | ICD-10-CM

## 2012-11-17 DIAGNOSIS — I10 Essential (primary) hypertension: Secondary | ICD-10-CM

## 2012-11-17 DIAGNOSIS — K219 Gastro-esophageal reflux disease without esophagitis: Secondary | ICD-10-CM

## 2012-11-17 DIAGNOSIS — J329 Chronic sinusitis, unspecified: Secondary | ICD-10-CM

## 2012-11-17 MED ORDER — ATENOLOL 25 MG PO TABS: ORAL_TABLET | ORAL | Status: DC

## 2012-11-17 MED ORDER — IPRATROPIUM-ALBUTEROL 20-100 MCG/ACT IN AERS: 1.0000 | INHALATION_SPRAY | Freq: Four times a day (QID) | RESPIRATORY_TRACT | Status: DC

## 2012-11-17 MED ORDER — ATORVASTATIN CALCIUM 40 MG PO TABS: 40.0000 mg | ORAL_TABLET | Freq: Every day | ORAL | Status: DC

## 2012-11-17 MED ORDER — MONTELUKAST SODIUM 10 MG PO TABS: ORAL_TABLET | ORAL | Status: DC

## 2012-11-17 MED ORDER — ESOMEPRAZOLE MAGNESIUM 40 MG PO CPDR: 40.0000 mg | DELAYED_RELEASE_CAPSULE | Freq: Every day | ORAL | Status: DC

## 2012-11-17 MED ORDER — MOMETASONE FUROATE 50 MCG/ACT NA SUSP: 2.0000 | Freq: Every day | NASAL | Status: DC

## 2012-11-17 NOTE — Telephone Encounter (Signed)
Patient called and stated that she need her prescriptions printed so she can submit them to her mail order pharmacy. Rx printed and patient notified. SW, CMA

## 2012-11-26 ENCOUNTER — Other Ambulatory Visit: Payer: Self-pay

## 2012-11-29 ENCOUNTER — Ambulatory Visit
Admission: RE | Admit: 2012-11-29 | Discharge: 2012-11-29 | Disposition: A | Discharge status: Home / Self Care | Payer: Managed Care, Other (non HMO) | Source: Ambulatory Visit | Attending: Family Medicine | Admitting: Family Medicine

## 2012-12-10 ENCOUNTER — Other Ambulatory Visit (INDEPENDENT_AMBULATORY_CARE_PROVIDER_SITE_OTHER): Payer: Managed Care, Other (non HMO)

## 2012-12-10 LAB — HEPATIC FUNCTION PANEL
ALT: 56 U/L — ABNORMAL HIGH (ref 0–35)
AST: 29 U/L (ref 0–37)
Albumin: 4.1 g/dL (ref 3.5–5.2)
Alkaline Phosphatase: 136 U/L — ABNORMAL HIGH (ref 39–117)
Bilirubin, Direct: 0 mg/dL (ref 0.0–0.3)
Total Bilirubin: 1.1 mg/dL (ref 0.3–1.2)

## 2013-01-25 ENCOUNTER — Telehealth: Payer: Self-pay | Admitting: *Deleted

## 2013-01-25 ENCOUNTER — Other Ambulatory Visit: Payer: Self-pay | Admitting: Family Medicine

## 2013-01-25 DIAGNOSIS — K219 Gastro-esophageal reflux disease without esophagitis: Secondary | ICD-10-CM

## 2013-01-25 NOTE — Telephone Encounter (Signed)
Patient called and stated that she would like a referral to GI referral. Patient states that she is experiencing some pain and burning in the abdominal area. Is it okay to place referral? Please advise. Sw

## 2013-01-28 ENCOUNTER — Encounter: Payer: Self-pay | Admitting: Gastroenterology

## 2013-03-01 ENCOUNTER — Encounter: Payer: Self-pay | Admitting: Gastroenterology

## 2013-03-01 ENCOUNTER — Ambulatory Visit (INDEPENDENT_AMBULATORY_CARE_PROVIDER_SITE_OTHER): Payer: Managed Care, Other (non HMO) | Admitting: Gastroenterology

## 2013-03-01 VITALS — BP 120/78 | HR 68 | Ht 62.0 in | Wt 181.2 lb

## 2013-03-01 DIAGNOSIS — K222 Esophageal obstruction: Secondary | ICD-10-CM

## 2013-03-01 DIAGNOSIS — R7989 Other specified abnormal findings of blood chemistry: Secondary | ICD-10-CM

## 2013-03-01 DIAGNOSIS — R945 Abnormal results of liver function studies: Secondary | ICD-10-CM

## 2013-03-01 DIAGNOSIS — R1012 Left upper quadrant pain: Secondary | ICD-10-CM

## 2013-03-01 DIAGNOSIS — R932 Abnormal findings on diagnostic imaging of liver and biliary tract: Secondary | ICD-10-CM

## 2013-03-01 HISTORY — DX: Abnormal findings on diagnostic imaging of liver and biliary tract: R93.2

## 2013-03-01 HISTORY — DX: Other specified abnormal findings of blood chemistry: R79.89

## 2013-03-01 HISTORY — DX: Abnormal results of liver function studies: R94.5

## 2013-03-01 HISTORY — DX: Left upper quadrant pain: R10.12

## 2013-03-01 MED ORDER — HYOSCYAMINE SULFATE ER 0.375 MG PO TBCR: EXTENDED_RELEASE_TABLET | ORAL | Status: DC

## 2013-03-01 NOTE — Assessment & Plan Note (Signed)
Current dysphagia solids is probably reflective of a recurrent esophageal stricture.    Recommendations #1 upper endoscopy with dilatation as indicated

## 2013-03-01 NOTE — Assessment & Plan Note (Signed)
,   Pain is nonspecific and may be do to nonulcer dyspepsia.  Although she has gallstones I don't think she is symptomatic from this.  Recommendations #1 trial of hyomax 0.375 mg twice a day

## 2013-03-01 NOTE — Assessment & Plan Note (Signed)
Although patient has gallbladder stones I do not think she is symptomatic from this.

## 2013-03-01 NOTE — Assessment & Plan Note (Signed)
Mild elevation of the transaminases is likely due to  hepatic steatosis.

## 2013-03-01 NOTE — Progress Notes (Signed)
_                                                                                                                History of Present Illness: A 58 year old white female referred for evaluation of abdominal pain and dysphagia.  For the past month she's been complaining of intermittent but fairly frequent left upper quadrant pain.  Pain is spontaneous although may be worsened with eating.  She also has abdominal bloating.  She takes Nexium.  She's on no gastric irritants including nonsteroidals.  She also complains of dysphagia to solids.  Colonoscopy in 2009 demonstrated hyperplastic polyps.  Endoscopy in 2011 was pertinent for a superficial ulcer in the gastric antrum and an esophageal stricture which was dilated.  Abdominal ultrasound in November, 2014 demonstrated either a single large gallstone or a grouping of gallstones.  Hepatic steatosis was also seen.  Very mild elevations of the transaminases prompted abdominal ultrasound.  She denies change of bowel habits, melena or hematochezia.    Past Medical History  Diagnosis Date  . Hypertension   . Hyperlipidemia   . GERD (gastroesophageal reflux disease)   . Asthma    Past Surgical History  Procedure Laterality Date  . Tubal ligation    . Appendectomy    . Abdominal hysterectomy  08/2004  . Tonsillectomy    . Breast enhancement surgery  2001   family history includes Asthma in an other family member; Breast cancer in her maternal grandmother; Cervical cancer in her maternal grandmother; Colon cancer in her maternal grandmother; Colon polyps in her father; Coronary artery disease in an other family member; Diabetes in her father; Hyperlipidemia in an other family member; Hypertension in an other family member; Irritable bowel syndrome in her mother; Kidney disease in her father; Rheumatic fever in her mother; Uterine cancer in her maternal grandmother. Current Outpatient Prescriptions  Medication Sig Dispense Refill  .  atenolol (TENORMIN) 25 MG tablet Take 1 tablet by mouth daily--  90 tablet  3  . calcium carbonate (TUMS EX) 750 MG chewable tablet Chew 2 tablets by mouth 2 (two) times daily.        . Cholecalciferol (VITAMIN D PO) Take 600 Units by mouth daily.        [name] esomeprazole (NEXIUM) 40 MG capsule Take 1 capsule (40 mg total) by mouth daily.  90 capsule  3  . Fish Oil OIL 1,000 mg by Does not apply route daily.        . Ipratropium-Albuterol (COMBIVENT RESPIMAT) 20-100 MCG/ACT AERS respimat Inhale 1 puff into the lungs 4 (four) times daily.  1 Inhaler  3  . mometasone (NASONEX) 50 MCG/ACT nasal spray Place 2 sprays into the nose daily.  17 g  2  . montelukast (SINGULAIR) 10 MG tablet TAKE 1 TABLET DAILY  90 tablet  3  . Propylene Glycol (SYSTANE BALANCE) 0.6 % SOLN Apply 1 drop to eye as needed.       No current facility-administered medications for this visit.  Allergies as of 03/01/2013 - Review Complete 03/01/2013  Allergen Reaction Noted  . Contrast media [iodinated diagnostic agents] Anaphylaxis 10/18/2012  . Metamucil [psyllium] Anaphylaxis 10/18/2012  . Mustard seed Anaphylaxis 10/18/2012    reports that she has never smoked. She has never used smokeless tobacco. She reports that she drinks alcohol. She reports that she does not use illicit drugs.     Review of Systems: Pertinent positive and negative review of systems were noted in the above HPI section. All other review of systems were otherwise negative.  Vital signs were reviewed in today's medical record Physical Exam: General: Well developed , well nourished, no acute distress Skin: anicteric Head: Normocephalic and atraumatic Eyes:  sclerae anicteric, EOMI Ears: Normal auditory acuity Mouth: No deformity or lesions Neck: Supple, no masses or thyromegaly Lungs: Clear throughout to auscultation Heart: Regular rate and rhythm; no murmurs, rubs or bruits Abdomen: Soft,  and non distended. No masses, hepatosplenomegaly or  hernias noted. Normal Bowel sounds.  There is very mild tenderness to palpation in the left subcostal area that is unchanged with abdominal muscle wall flexion Rectal:deferred Musculoskeletal: Symmetrical with no gross deformities  Skin: No lesions on visible extremities Pulses:  Normal pulses noted Extremities: No clubbing, cyanosis, edema or deformities noted Neurological: Alert oriented x 4, grossly nonfocal Cervical Nodes:  No significant cervical adenopathy Inguinal Nodes: No significant inguinal adenopathy Psychological:  Alert and cooperative. Normal mood and affect  See Assessment and Plan under Problem List

## 2013-03-15 ENCOUNTER — Ambulatory Visit (AMBULATORY_SURGERY_CENTER): Payer: Managed Care, Other (non HMO) | Admitting: Gastroenterology

## 2013-03-15 ENCOUNTER — Encounter: Payer: Self-pay | Admitting: Gastroenterology

## 2013-03-15 VITALS — BP 111/55 | HR 72 | Temp 98.5°F | Resp 23 | Ht 62.0 in | Wt 181.0 lb

## 2013-03-15 DIAGNOSIS — K219 Gastro-esophageal reflux disease without esophagitis: Secondary | ICD-10-CM

## 2013-03-15 MED ORDER — SODIUM CHLORIDE 0.9 % IV SOLN: 500.0000 mL | INTRAVENOUS | Status: DC

## 2013-03-15 NOTE — Patient Instructions (Signed)
YOU HAD AN ENDOSCOPIC PROCEDURE TODAY AT THE [facility]: Refer to the procedure report that was given to you for any specific questions about what was found during the examination.  If the procedure report does not answer your questions, please call your gastroenterologist to clarify.  If you requested that your care partner not be given the details of your procedure findings, then the procedure report has been included in a sealed envelope for you to review at your convenience later.  YOU SHOULD EXPECT: Some feelings of bloating in the abdomen. Passage of more gas than usual.  Walking can help get rid of the air that was put into your GI tract during the procedure and reduce the bloating. If you had a lower endoscopy (such as a colonoscopy or flexible sigmoidoscopy) you may notice spotting of blood in your stool or on the toilet paper. If you underwent a bowel prep for your procedure, then you may not have a normal bowel movement for a few days.  DIET: Your first meal following the procedure should be a light meal and then it is ok to progress to your normal diet.  A half-sandwich or bowl of soup is an example of a good first meal.  Heavy or fried foods are harder to digest and may make you feel nauseous or bloated.  Likewise meals heavy in dairy and vegetables can cause extra gas to form and this can also increase the bloating.  Drink plenty of fluids but you should avoid alcoholic beverages for 24 hours.  ACTIVITY: Your care partner should take you home directly after the procedure.  You should plan to take it easy, moving slowly for the rest of the day.  You can resume normal activity the day after the procedure however you should NOT DRIVE or use heavy machinery for 24 hours (because of the sedation medicines used during the test).    SYMPTOMS TO REPORT IMMEDIATELY: A gastroenterologist can be reached at any hour.  During normal business hours, 8:30 AM to 5:00 PM Monday through Friday,  call [phone].  After hours and on weekends, please call the GI answering service at [phone] who will take a message and have the physician on call contact you.  Following upper endoscopy (EGD)  Vomiting of blood or coffee ground material  New chest pain or pain under the shoulder blades  Painful or persistently difficult swallowing  New shortness of breath  Fever of 100F or higher  Black, tarry-looking stools  FOLLOW UP: If any biopsies were taken you will be contacted by phone or by letter within the next 1-3 weeks.  Call your gastroenterologist if you have not heard about the biopsies in 3 weeks.  Our staff will call the home number listed on your records the next business day following your procedure to check on you and address any questions or concerns that you may have at that time regarding the information given to you following your procedure. This is a courtesy call and so if there is no answer at the home number and we have not heard from you through the emergency physician on call, we will assume that you have returned to your regular daily activities without incident.  SIGNATURES/CONFIDENTIALITY: You and/or your care partner have signed paperwork which will be entered into your electronic medical record.  These signatures attest to the fact that that the information above on your After Visit Summary has been reviewed and is understood.  Full responsibility of   the confidentiality of this discharge information lies with you and/or your care-partner. 

## 2013-03-15 NOTE — Progress Notes (Signed)
No egg or soy allergy. ewm Pt states due to her asthma, it takes extra oxygen for procedures and also extra time to wake up. ewm

## 2013-03-15 NOTE — Op Note (Signed)
[place]  [address]. [place], [address]   ENDOSCOPY PROCEDURE REPORT  PATIENT: Nicole, Richard  MR#: #[id number] BIRTHDATE: 08-06-1955 , 57  yrs. old GENDER: Female ENDOSCOPIST: Inda Castle, MD REFERRED BY: PROCEDURE DATE:  03/15/2013 PROCEDURE:  EGD, diagnostic ASA CLASS:     Class II INDICATIONS:  Epigastric pain.   Dysphagia. MEDICATIONS: MAC sedation, administered by CRNA, propofol (Diprivan) 200mg  IV, and Simethicone 0.6cc PO TOPICAL ANESTHETIC: Cetacaine Spray  DESCRIPTION OF PROCEDURE: After the risks benefits and alternatives of the procedure were thoroughly explained, informed consent was obtained.  The LB [id number] endoscope was introduced through the mouth and advanced to the third portion of the duodenum. Without limitations.  The instrument was slowly withdrawn as the mucosa was fully examined.      The upper, middle and distal third of the esophagus were carefully inspected and no abnormalities were noted.  The z-line was well seen at the GEJ.  The endoscope was pushed into the fundus which was normal including a retroflexed view.  The antrum, gastric body, first and second part of the duodenum were unremarkable. Retroflexed views revealed no abnormalities.     The scope was then withdrawn from the patient and the procedure completed.  COMPLICATIONS: There were no complications. ENDOSCOPIC IMPRESSION: Normal EGD  RECOMMENDATIONS: Continue current medications including hyomax as needed return office visit as needed  REPEAT EXAM:  eSigned:  Inda Castle, MD 03/15/2013 2:53 PM   PY:[id number] R Etter Sjogren DO and Aklilu,Mebea MD

## 2013-03-16 ENCOUNTER — Telehealth: Payer: Self-pay | Admitting: *Deleted

## 2013-03-16 NOTE — Telephone Encounter (Signed)
No answer, left message to call if questions or concerns. 

## 2013-04-06 ENCOUNTER — Ambulatory Visit (INDEPENDENT_AMBULATORY_CARE_PROVIDER_SITE_OTHER): Payer: Managed Care, Other (non HMO) | Admitting: Family Medicine

## 2013-04-06 ENCOUNTER — Encounter: Payer: Self-pay | Admitting: Family Medicine

## 2013-04-06 VITALS — BP 126/78 | HR 68 | Temp 98.4°F | Wt 180.0 lb

## 2013-04-06 DIAGNOSIS — E785 Hyperlipidemia, unspecified: Secondary | ICD-10-CM

## 2013-04-06 DIAGNOSIS — M654 Radial styloid tenosynovitis [de Quervain]: Secondary | ICD-10-CM

## 2013-04-06 MED ORDER — PREDNISONE 10 MG PO TABS: ORAL_TABLET | ORAL | Status: DC

## 2013-04-06 NOTE — Patient Instructions (Signed)
De Quervain's Tenosynovitis De Quervain's tenosynovitis involves inflammation of one or two tendon linings (sheaths) or strain of one or two tendons to the thumb: extensor pollicis brevis (EPB), or abductor pollicis longus (APL). This causes pain on the side of the wrist and base of the thumb. Tendon sheaths secrete a fluid that lubricates the tendon, allowing the tendon to move smoothly. When the sheath becomes inflamed, the tendon cannot move freely in the sheath. Both the EPB and APL tendons are important for proper use of the hand. The EPB tendon is important for straightening the thumb. The APL tendon is important for moving the thumb away from the index finger (abducting). The two tendons pass through a small tube (canal) in the wrist, near the base of the thumb. When the tendons become inflamed, pain is usually felt in this area. SYMPTOMS   Pain, tenderness, swelling, warmth, or redness over the base of the thumb and thumb side of the wrist.  Pain that gets worse when straightening the thumb.  Pain that gets worse when moving the thumb away from the index finger, against resistance.  Pain with pinching or gripping.  Locking or catching of the thumb.  Limited motion of the thumb.  Crackling sound (crepitation) when the tendon or thumb is moved or touched.  Fluid-filled cyst in the area of the base of the thumb. CAUSES   Tenosynovitis is often linked with overuse of the wrist.  Tenosynovitis may be caused by repeated injury to the thumb muscle and tendon units, and with repeated motions of the hand and wrist, due to friction of the tendon within the lining (sheath).  Tenosynovitis may also be due to a sudden increase in activity or change in activity. RISK INCREASES WITH:  Sports that involve repeated hand and wrist motions (golf, bowling, tennis, squash, racquetball).  Heavy labor.  Poor physical wrist strength and flexibility.  Failure to warm up properly before practice or  play.  Female gender.  New mothers who hold their baby's head for long periods or lift infants with thumbs in the infant's armpit (axilla). PREVENTION  Warm up and stretch properly before practice or competition.  Allow enough time for rest and recovery between practices and competition.  Maintain appropriate conditioning:  Cardiovascular fitness.  Forearm, wrist, and hand flexibility.  Muscle strength and endurance.  Use proper exercise technique. PROGNOSIS  This condition is usually curable within 6 weeks, if treated properly with non-surgical treatment and resting of the affected area.  RELATED COMPLICATIONS   Longer healing time if not properly treated or if not given enough time to heal.  Chronic inflammation, causing recurring symptoms of tenosynovitis. Permanent pain or restriction of movement.  Risks of surgery: infection, bleeding, injury to nerves (numbness of the thumb), continued pain, incomplete release of the tendon sheath, recurring symptoms, cutting of the tendons, tendons sliding out of position, weakness of the thumb, thumb stiffness. TREATMENT  First, treatment involves the use of medicine and ice, to reduce pain and inflammation. Patients are encouraged to stop or modify activities that aggravate the injury. Stretching and strengthening exercises may be advised. Exercises may be completed at home or with a therapist. You may be fitted with a brace or splint, to limit motion and allow the injury to heal. Your caregiver may also choose to give you a corticosteroid injection, to reduce the pain and inflammation. If non-surgical treatment is not successful, surgery may be needed. Most tenosynovitis surgeries are done as outpatient procedures (you go home the   may also choose to give you a corticosteroid injection, to reduce the pain and inflammation. If non-surgical treatment is not successful, surgery may be needed. Most tenosynovitis surgeries are done as outpatient procedures (you go home the same day). Surgery may involve local, regional (whole arm), or general anesthesia.   MEDICATION   · If pain medicine is needed, nonsteroidal anti-inflammatory medicines (aspirin and ibuprofen), or other minor pain  relievers (acetaminophen), are often advised.  · Do not take pain medicine for 7 days before surgery.  · Prescription pain relievers are often prescribed only after surgery. Use only as directed and only as much as you need.  · Corticosteroid injections may be given if your caregiver thinks they are needed. There is a limited number of times these injections may be given.  COLD THERAPY   · Cold treatment (icing) should be applied for 10 to 15 minutes every 2 to 3 hours for inflammation and pain, and immediately after activity that aggravates your symptoms. Use ice packs or an ice massage.  SEEK MEDICAL CARE IF:   · Symptoms get worse or do not improve in 2 to 4 weeks, despite treatment.  · You experience pain, numbness, or coldness in the hand.  · Blue, gray, or dark color appears in the fingernails.  · Any of the following occur after surgery: increased pain, swelling, redness, drainage of fluids, bleeding in the affected area, or signs of infection.  · New, unexplained symptoms develop. (Drugs used in treatment may produce side effects.)  Document Released: 01/06/2005 Document Revised: 03/31/2011 Document Reviewed: 04/20/2008  [employer]® Patient Information ©2014 [employer].

## 2013-04-06 NOTE — Progress Notes (Signed)
Patient ID: Nicole Richard, female   DOB: 1955/10/14, 58 y.o.   MRN: [id number]   Subjective:    Patient ID: Nicole Richard, female    DOB: 05/06/1955, 58 y.o.   MRN: [id number] HPI Pt here c/o Left wrist / thumb pain x several months.  Pt is on the computer all day during the day.  She also admits to a fall in the fall but pain started long after that.  She also needs lipids checked.           Objective:    BP 126/78  Pulse 68  Temp(Src) 98.4 F (36.9 C) (Oral)  Wt 180 lb (81.647 kg)  SpO2 99% General appearance: alert, cooperative, appears stated age and no distress Extremities: extremities normal, atraumatic, no cyanosis or edema Pain along thumb, lat to wrist        Assessment & Plan:  1. De Quervain's tenosynovitis, left Splint and pred taper--- to hand surgeon if no relief - predniSONE (DELTASONE) 10 MG tablet; 3 po qd for 3 days then 2 po qd for 3 days the 1 po qd for 3 days  Dispense: 18 tablet; Refill: 0  2. Other and unspecified hyperlipidemia Check labs - Basic metabolic panel; Future - Hepatic function panel; Future - Lipid panel; Future

## 2013-04-06 NOTE — Progress Notes (Signed)
Pre visit review using our clinic review tool, if applicable. No additional management support is needed unless otherwise documented below in the visit note. 

## 2013-04-07 ENCOUNTER — Other Ambulatory Visit (INDEPENDENT_AMBULATORY_CARE_PROVIDER_SITE_OTHER): Payer: Managed Care, Other (non HMO)

## 2013-04-07 DIAGNOSIS — E785 Hyperlipidemia, unspecified: Secondary | ICD-10-CM

## 2013-04-07 LAB — HEPATIC FUNCTION PANEL
ALK PHOS: 143 U/L — AB (ref 39–117)
ALT: 76 U/L — ABNORMAL HIGH (ref 0–35)
AST: 37 U/L (ref 0–37)
Albumin: 4.3 g/dL (ref 3.5–5.2)
Bilirubin, Direct: 0 mg/dL (ref 0.0–0.3)
TOTAL PROTEIN: 7.3 g/dL (ref 6.0–8.3)
Total Bilirubin: 0.6 mg/dL (ref 0.3–1.2)

## 2013-04-07 LAB — BASIC METABOLIC PANEL
BUN: 15 mg/dL (ref 6–23)
CALCIUM: 9.7 mg/dL (ref 8.4–10.5)
CO2: 25 meq/L (ref 19–32)
CREATININE: 0.7 mg/dL (ref 0.4–1.2)
Chloride: 102 mEq/L (ref 96–112)
GFR: 93.63 mL/min (ref >60.00)
GLUCOSE: 176 mg/dL — AB (ref 70–99)
Potassium: 4.4 mEq/L (ref 3.5–5.1)
Sodium: 139 mEq/L (ref 135–145)

## 2013-04-07 LAB — LIPID PANEL
Cholesterol: 300 mg/dL — ABNORMAL HIGH (ref 0–200)
HDL: 53.8 mg/dL (ref >39.00)
LDL Cholesterol: 208 mg/dL — ABNORMAL HIGH (ref 0–99)
TRIGLYCERIDES: 193 mg/dL — AB (ref 0.0–149.0)
Total CHOL/HDL Ratio: 6
VLDL: 38.6 mg/dL (ref 0.0–40.0)

## 2013-04-09 ENCOUNTER — Other Ambulatory Visit: Payer: Self-pay | Admitting: Family Medicine

## 2013-04-09 DIAGNOSIS — E785 Hyperlipidemia, unspecified: Secondary | ICD-10-CM

## 2013-04-13 ENCOUNTER — Telehealth: Payer: Self-pay | Admitting: *Deleted

## 2013-04-13 NOTE — Telephone Encounter (Signed)
Message copied by Chilton Greathouse on Wed Apr 13, 2013  3:33 PM ------      Message from: Rosalita Chessman      Created: Sat Apr 09, 2013 12:44 PM       Cholesterol--- LDL goal < 100,  HDL >40,  TG < 150.  Diet and exercise will increase HDL and decrease LDL and TG.  Fish,  Fish Oil, Flaxseed oil will also help increase the HDL and decrease Triglycerides.  Refer to lipid clinic---  Statins d/c secondary to abd pain and elevated LFT.             ------

## 2013-04-14 ENCOUNTER — Encounter: Payer: Self-pay | Admitting: *Deleted

## 2013-04-14 ENCOUNTER — Ambulatory Visit: Payer: Managed Care, Other (non HMO) | Admitting: Pharmacist

## 2013-04-14 NOTE — Telephone Encounter (Signed)
Left message on voice mail that due to her chol and liver fx tests elevation gdue to statin drugs, we have placed a referral to the lipid clinic. Copy of labs mailed to the patient.

## 2013-04-19 ENCOUNTER — Ambulatory Visit (INDEPENDENT_AMBULATORY_CARE_PROVIDER_SITE_OTHER): Payer: Managed Care, Other (non HMO) | Admitting: Pharmacist

## 2013-04-19 VITALS — Wt 179.0 lb

## 2013-04-19 DIAGNOSIS — Z79899 Other long term (current) drug therapy: Secondary | ICD-10-CM

## 2013-04-19 DIAGNOSIS — E785 Hyperlipidemia, unspecified: Secondary | ICD-10-CM

## 2013-04-19 MED ORDER — ATORVASTATIN CALCIUM 40 MG PO TABS
20.0000 mg | ORAL_TABLET | Freq: Every day | ORAL | Status: DC
Start: 2013-04-19 — End: 2013-09-29

## 2013-04-19 NOTE — Patient Instructions (Signed)
1.  Restart lipitor 20 mg (use 1/2 of 40 mg pill) 2.  Limit to 1400 calories per day.  Use My Fitness Pal app. 3.  Start using T-25 exercise video.  Use lower impact exercise portion to start. 4.  Limit sweet tea to no more than 1 glass per day.  Prefer to cut this out all together.  No soda. 5.  Limit 1 carb per meal (size of fist). 6.  Recheck liver function in 4 weeks and may consider lipitor 40 mg at that time if LFTs remain stable.

## 2013-04-19 NOTE — Assessment & Plan Note (Signed)
Her liver enzyme elevation is likely due to fatty liver and not to do with her statin.  This type of mild elevation is often seen with fatty liver, and given her LDL > 200, recent weight gain, and the fact that LFTs stable on and off statin, will restart statin today, and recheck LFTs in 4 weeks.  Patient and I had a long discussion about diet, exercise, and weight loss.  She has purchased a good calorie counting guide, has access to T-25 and the gym, and we discussed the need to limit carbs and sweet tea / desserts moving forward.  Will restart lipitor at 20 mg instead of 40 mg qd, and in 4 weeks if LFTs stable (ALT ~ 50-80), would recommend increasing to 40 mg daily.  Will have patient recheck LFTs with Dr. Etter Sjogren in 4 weeks given she lives so far away, and patient will be managed there again moving forward.  Since patient's cholesterol was well controlled on this in the past, I doubt further medication will be needed, assuming LFTs don't go up even further.  With weight loss, I suspect her LFTs will improve.  Patient is to call me if she has problems, questions, or feels she needs to be seen in clinic again.

## 2013-04-19 NOTE — Progress Notes (Signed)
Patient is a 58 y.o. WF referred to lipid clinic by Dr. Etter Sjogren due to recent history of elevated LFTs (ALT 50-80 U/L) and concern with statin use.  Patient took lipitor for ~ 8 years, but LFTs trended up slightly in 10/2012 and lipitor was stopped at that time.  LFTs remains slightly elevated now 6 months later despite not taking statin.  She had an abdominal U/S done 11/2012 which showed fatty liver and (+) gall stones.  She tells me she isn't having abdominal pain currently.  Patient has family h/o early heart disease (father), and significant insulin resistance, so would like to see LDL < 100 mg/dL.  Her LDL is > 200 mg/dL currently since off statin, so needs a 50% reduction.  Patient's weight has been going up gradually for the past 5 years.  She was 130 lbs she tells me 5 years ago, 165 lbs last year, and now up to 179 lbs in the office today.  She carries her weight in her abdominal region which could cause both insulin resistance and explain elevated LFTs from fatty liver as well.    RF:  HTN, insulin resistance (pre-DM vs DM), family h/o CAD, age - LDL goal < 100 mg/dL as baseline LDL > 200 mg/dL Meds:  Not on lipid lowering therapy.  Off Lipitor for 6 months now due to ALT ~ 50-80 U/L  Diet:  She plans on improving this with her My[employer]itness[employer] she recently purchased and plans on doing 1400 calories per day max.   Currently her diet is bad:  Patient often skips breakfast.  She typically brings food from home for lunch but eats out 2 days per week.  Evening time is typically at home, but she admits to eating fried foods a few days of the week, red meat a few days of the week, and eating lots of carbohydrates.  She eats lots of breads, pasta, potatotes on a daily basis.  She also drinks ~ 3 glasses of sweet tea daily which could be contributing to her weight gain and elevated glucose.   Exercise:  None for over a year now.  She has a membership to the gym but doesn't go due to crazy work schedule.  She is  willing to start working out at home.  She recently purchased T-25 which is a good aerobic workout that offers a low impact workout as well so as not not injure her knees.   Social history:  Drinks wine occasionally.  She works in [employer], and often 9-10 hour days.  Labs:  03/2013:  TC 300, TG 193, LDL 208, HDL 54, ALT 76, AST 37, ALP 143 (not on lipid lowering therapy - off Lipitor x 6 months)  Current Outpatient Prescriptions  Medication Sig Dispense Refill  . aspirin 81 MG tablet Take 81 mg by mouth daily.      [name] atenolol (TENORMIN) 25 MG tablet Take 1 tablet by mouth daily--  90 tablet  3  . calcium citrate-vitamin D (CITRACAL+D) 315-200 MG-UNIT per tablet Take 1 tablet by mouth 2 (two) times daily.      . Cholecalciferol (VITAMIN D PO) Take 600 Units by mouth daily.        [name] esomeprazole (NEXIUM) 40 MG capsule Take 1 capsule (40 mg total) by mouth daily.  90 capsule  3  . Flaxseed, Linseed, (FLAXSEED OIL) 1200 MG CAPS Take 1 capsule by mouth daily.      [name] Hyoscyamine Sulfate 0.375 MG TBCR Take one tab twice  a day for 5 days then as needed abdominal pain  25 tablet  1  . Ipratropium-Albuterol (COMBIVENT RESPIMAT) 20-100 MCG/ACT AERS respimat Inhale 1 puff into the lungs 4 (four) times daily.  1 Inhaler  3  . mometasone (NASONEX) 50 MCG/ACT nasal spray Place 2 sprays into the nose daily.  17 g  2  . montelukast (SINGULAIR) 10 MG tablet TAKE 1 TABLET DAILY  90 tablet  3  . Multiple Vitamin (MULTIVITAMIN WITH MINERALS) TABS tablet Take 1 tablet by mouth daily.      . predniSONE (DELTASONE) 10 MG tablet 3 po qd for 3 days then 2 po qd for 3 days the 1 po qd for 3 days  18 tablet  0  . Propylene Glycol (SYSTANE BALANCE) 0.6 % SOLN Apply 1 drop to eye as needed.       No current facility-administered medications for this visit.   Allergies  Allergen Reactions  . Contrast Media [Iodinated Diagnostic Agents] Anaphylaxis  . Metamucil [Psyllium] Anaphylaxis  . Mustard Seed Anaphylaxis    Family History  Problem Relation Age of Onset  . Coronary artery disease    . Hyperlipidemia    . Hypertension    . Asthma    . Cervical cancer Maternal Grandmother   . Breast cancer Maternal Grandmother   . Colon cancer Maternal Grandmother   . Uterine cancer Maternal Grandmother   . Rheumatic fever Mother   . Irritable bowel syndrome Mother   . Diabetes Father   . Colon polyps Father   . Kidney disease Father   . Esophageal cancer Neg Hx   . Rectal cancer Neg Hx   . Stomach cancer Neg Hx

## 2013-05-17 ENCOUNTER — Other Ambulatory Visit (INDEPENDENT_AMBULATORY_CARE_PROVIDER_SITE_OTHER): Payer: Managed Care, Other (non HMO)

## 2013-05-17 DIAGNOSIS — Z79899 Other long term (current) drug therapy: Secondary | ICD-10-CM

## 2013-05-17 DIAGNOSIS — E785 Hyperlipidemia, unspecified: Secondary | ICD-10-CM

## 2013-05-17 LAB — HEPATIC FUNCTION PANEL
ALT: 45 U/L — ABNORMAL HIGH (ref 0–35)
AST: 29 U/L (ref 0–37)
Albumin: 4 g/dL (ref 3.5–5.2)
Alkaline Phosphatase: 137 U/L — ABNORMAL HIGH (ref 39–117)
Bilirubin, Direct: 0.1 mg/dL (ref 0.0–0.3)
Total Bilirubin: 0.7 mg/dL (ref 0.3–1.2)
Total Protein: 7 g/dL (ref 6.0–8.3)

## 2013-08-08 ENCOUNTER — Other Ambulatory Visit: Payer: Self-pay

## 2013-08-08 DIAGNOSIS — Z1231 Encounter for screening mammogram for malignant neoplasm of breast: Secondary | ICD-10-CM

## 2013-09-07 ENCOUNTER — Ambulatory Visit
Admission: RE | Admit: 2013-09-07 | Discharge: 2013-09-07 | Disposition: A | Discharge status: Home / Self Care | Payer: Managed Care, Other (non HMO) | Source: Ambulatory Visit

## 2013-09-07 DIAGNOSIS — Z1231 Encounter for screening mammogram for malignant neoplasm of breast: Secondary | ICD-10-CM

## 2013-09-29 ENCOUNTER — Other Ambulatory Visit (HOSPITAL_COMMUNITY)
Admission: RE | Admit: 2013-09-29 | Discharge: 2013-09-29 | Disposition: A | Discharge status: Home / Self Care | Payer: Managed Care, Other (non HMO) | Source: Ambulatory Visit | Attending: Family Medicine | Admitting: Family Medicine

## 2013-09-29 ENCOUNTER — Encounter: Payer: Self-pay | Admitting: Family Medicine

## 2013-09-29 ENCOUNTER — Other Ambulatory Visit: Payer: Self-pay | Admitting: Family Medicine

## 2013-09-29 ENCOUNTER — Ambulatory Visit (INDEPENDENT_AMBULATORY_CARE_PROVIDER_SITE_OTHER): Payer: Managed Care, Other (non HMO) | Admitting: Family Medicine

## 2013-09-29 VITALS — BP 131/83 | HR 69 | Temp 98.7°F | Ht 60.0 in | Wt 174.6 lb

## 2013-09-29 DIAGNOSIS — E785 Hyperlipidemia, unspecified: Secondary | ICD-10-CM

## 2013-09-29 DIAGNOSIS — D229 Melanocytic nevi, unspecified: Secondary | ICD-10-CM

## 2013-09-29 DIAGNOSIS — N760 Acute vaginitis: Secondary | ICD-10-CM | POA: Insufficient documentation

## 2013-09-29 DIAGNOSIS — Z01419 Encounter for gynecological examination (general) (routine) without abnormal findings: Secondary | ICD-10-CM | POA: Insufficient documentation

## 2013-09-29 DIAGNOSIS — R7309 Other abnormal glucose: Secondary | ICD-10-CM

## 2013-09-29 DIAGNOSIS — K219 Gastro-esophageal reflux disease without esophagitis: Secondary | ICD-10-CM

## 2013-09-29 DIAGNOSIS — J45909 Unspecified asthma, uncomplicated: Secondary | ICD-10-CM

## 2013-09-29 DIAGNOSIS — Z1151 Encounter for screening for human papillomavirus (HPV): Secondary | ICD-10-CM | POA: Diagnosis present

## 2013-09-29 DIAGNOSIS — J309 Allergic rhinitis, unspecified: Secondary | ICD-10-CM

## 2013-09-29 DIAGNOSIS — R739 Hyperglycemia, unspecified: Secondary | ICD-10-CM

## 2013-09-29 DIAGNOSIS — Z0000 Encounter for general adult medical examination without abnormal findings: Secondary | ICD-10-CM

## 2013-09-29 DIAGNOSIS — Z124 Encounter for screening for malignant neoplasm of cervix: Secondary | ICD-10-CM

## 2013-09-29 DIAGNOSIS — I10 Essential (primary) hypertension: Secondary | ICD-10-CM

## 2013-09-29 DIAGNOSIS — D485 Neoplasm of uncertain behavior of skin: Secondary | ICD-10-CM

## 2013-09-29 DIAGNOSIS — J302 Other seasonal allergic rhinitis: Secondary | ICD-10-CM

## 2013-09-29 MED ORDER — MONTELUKAST SODIUM 10 MG PO TABS: ORAL_TABLET | ORAL | Status: DC

## 2013-09-29 MED ORDER — FLUCONAZOLE 150 MG PO TABS: 150.0000 mg | ORAL_TABLET | Freq: Once | ORAL | Status: DC

## 2013-09-29 MED ORDER — ATORVASTATIN CALCIUM 20 MG PO TABS: 20.0000 mg | ORAL_TABLET | Freq: Every day | ORAL | Status: DC

## 2013-09-29 MED ORDER — ATENOLOL 25 MG PO TABS: ORAL_TABLET | ORAL | Status: DC

## 2013-09-29 MED ORDER — MOMETASONE FUROATE 50 MCG/ACT NA SUSP: 2.0000 | Freq: Every day | NASAL | Status: DC

## 2013-09-29 MED ORDER — ESOMEPRAZOLE MAGNESIUM 40 MG PO CPDR: 40.0000 mg | DELAYED_RELEASE_CAPSULE | Freq: Every day | ORAL | Status: DC

## 2013-09-29 MED ORDER — IPRATROPIUM-ALBUTEROL 20-100 MCG/ACT IN AERS: 1.0000 | INHALATION_SPRAY | Freq: Four times a day (QID) | RESPIRATORY_TRACT | Status: DC

## 2013-09-29 NOTE — Patient Instructions (Signed)
Preventive Care for Adults A healthy lifestyle and preventive care can promote health and wellness. Preventive health guidelines for women include the following key practices.  A routine yearly physical is a good way to check with your health care provider about your health and preventive screening. It is a chance to share any concerns and updates on your health and to receive a thorough exam.  Visit your dentist for a routine exam and preventive care every 6 months. Brush your teeth twice a day and floss once a day. Good oral hygiene prevents tooth decay and gum disease.  The frequency of eye exams is based on your age, health, family medical history, use of contact lenses, and other factors. Follow your health care provider's recommendations for frequency of eye exams.  Eat a healthy diet. Foods like vegetables, fruits, whole grains, low-fat dairy products, and lean protein foods contain the nutrients you need without too many calories. Decrease your intake of foods high in solid fats, added sugars, and salt. Eat the right amount of calories for you.Get information about a proper diet from your health care provider, if necessary.  Regular physical exercise is one of the most important things you can do for your health. Most adults should get at least 150 minutes of moderate-intensity exercise (any activity that increases your heart rate and causes you to sweat) each week. In addition, most adults need muscle-strengthening exercises on 2 or more days a week.  Maintain a healthy weight. The body mass index (BMI) is a screening tool to identify possible weight problems. It provides an estimate of body fat based on height and weight. Your health care provider can find your BMI and can help you achieve or maintain a healthy weight.For adults 20 years and older:  A BMI below 18.5 is considered underweight.  A BMI of 18.5 to 24.9 is normal.  A BMI of 25 to 29.9 is considered overweight.  A BMI of  30 and above is considered obese.  Maintain normal blood lipids and cholesterol levels by exercising and minimizing your intake of saturated fat. Eat a balanced diet with plenty of fruit and vegetables. Blood tests for lipids and cholesterol should begin at age 76 and be repeated every 5 years. If your lipid or cholesterol levels are high, you are over 50, or you are at high risk for heart disease, you may need your cholesterol levels checked more frequently.Ongoing high lipid and cholesterol levels should be treated with medicines if diet and exercise are not working.  If you smoke, find out from your health care provider how to quit. If you do not use tobacco, do not start.  Lung cancer screening is recommended for adults aged 22-80 years who are at high risk for developing lung cancer because of a history of smoking. A yearly low-dose CT scan of the lungs is recommended for people who have at least a 30-pack-year history of smoking and are a current smoker or have quit within the past 15 years. A pack year of smoking is smoking an average of 1 pack of cigarettes a day for 1 year (for example: 1 pack a day for 30 years or 2 packs a day for 15 years). Yearly screening should continue until the smoker has stopped smoking for at least 15 years. Yearly screening should be stopped for people who develop a health problem that would prevent them from having lung cancer treatment.  If you are pregnant, do not drink alcohol. If you are breastfeeding,  be very cautious about drinking alcohol. If you are not pregnant and choose to drink alcohol, do not have more than 1 drink per day. One drink is considered to be 12 ounces (355 mL) of beer, 5 ounces (148 mL) of wine, or 1.5 ounces (44 mL) of liquor.  Avoid use of street drugs. Do not share needles with anyone. Ask for help if you need support or instructions about stopping the use of drugs.  High blood pressure causes heart disease and increases the risk of  stroke. Your blood pressure should be checked at least every 1 to 2 years. Ongoing high blood pressure should be treated with medicines if weight loss and exercise do not work.  If you are 75-52 years old, ask your health care provider if you should take aspirin to prevent strokes.  Diabetes screening involves taking a blood sample to check your fasting blood sugar level. This should be done once every 3 years, after age 15, if you are within normal weight and without risk factors for diabetes. Testing should be considered at a younger age or be carried out more frequently if you are overweight and have at least 1 risk factor for diabetes.  Breast cancer screening is essential preventive care for women. You should practice "breast self-awareness." This means understanding the normal appearance and feel of your breasts and may include breast self-examination. Any changes detected, no matter how small, should be reported to a health care provider. Women in their 58s and 30s should have a clinical breast exam (CBE) by a health care provider as part of a regular health exam every 1 to 3 years. After age 16, women should have a CBE every year. Starting at age 53, women should consider having a mammogram (breast X-ray test) every year. Women who have a family history of breast cancer should talk to their health care provider about genetic screening. Women at a high risk of breast cancer should talk to their health care providers about having an MRI and a mammogram every year.  Breast cancer gene (BRCA)-related cancer risk assessment is recommended for women who have family members with BRCA-related cancers. BRCA-related cancers include breast, ovarian, tubal, and peritoneal cancers. Having family members with these cancers may be associated with an increased risk for harmful changes (mutations) in the breast cancer genes BRCA1 and BRCA2. Results of the assessment will determine the need for genetic counseling and  BRCA1 and BRCA2 testing.  Routine pelvic exams to screen for cancer are no longer recommended for nonpregnant women who are considered low risk for cancer of the pelvic organs (ovaries, uterus, and vagina) and who do not have symptoms. Ask your health care provider if a screening pelvic exam is right for you.  If you have had past treatment for cervical cancer or a condition that could lead to cancer, you need Pap tests and screening for cancer for at least 20 years after your treatment. If Pap tests have been discontinued, your risk factors (such as having a new sexual partner) need to be reassessed to determine if screening should be resumed. Some women have medical problems that increase the chance of getting cervical cancer. In these cases, your health care provider may recommend more frequent screening and Pap tests.  The HPV test is an additional test that may be used for cervical cancer screening. The HPV test looks for the virus that can cause the cell changes on the cervix. The cells collected during the Pap test can be  tested for HPV. The HPV test could be used to screen women aged 30 years and older, and should be used in women of any age who have unclear Pap test results. After the age of 30, women should have HPV testing at the same frequency as a Pap test.  Colorectal cancer can be detected and often prevented. Most routine colorectal cancer screening begins at the age of 50 years and continues through age 75 years. However, your health care provider may recommend screening at an earlier age if you have risk factors for colon cancer. On a yearly basis, your health care provider may provide home test kits to check for hidden blood in the stool. Use of a small camera at the end of a tube, to directly examine the colon (sigmoidoscopy or colonoscopy), can detect the earliest forms of colorectal cancer. Talk to your health care provider about this at age 50, when routine screening begins. Direct  exam of the colon should be repeated every 5-10 years through age 75 years, unless early forms of pre-cancerous polyps or small growths are found.  People who are at an increased risk for hepatitis B should be screened for this virus. You are considered at high risk for hepatitis B if:  You were born in a country where hepatitis B occurs often. Talk with your health care provider about which countries are considered high risk.  Your parents were born in a high-risk country and you have not received a shot to protect against hepatitis B (hepatitis B vaccine).  You have HIV or AIDS.  You use needles to inject street drugs.  You live with, or have sex with, someone who has hepatitis B.  You get hemodialysis treatment.  You take certain medicines for conditions like cancer, organ transplantation, and autoimmune conditions.  Hepatitis C blood testing is recommended for all people born from 1945 through 1965 and any individual with known risks for hepatitis C.  Practice safe sex. Use condoms and avoid high-risk sexual practices to reduce the spread of sexually transmitted infections (STIs). STIs include gonorrhea, chlamydia, syphilis, trichomonas, herpes, HPV, and human immunodeficiency virus (HIV). Herpes, HIV, and HPV are viral illnesses that have no cure. They can result in disability, cancer, and death.  You should be screened for sexually transmitted illnesses (STIs) including gonorrhea and chlamydia if:  You are sexually active and are younger than 24 years.  You are older than 24 years and your health care provider tells you that you are at risk for this type of infection.  Your sexual activity has changed since you were last screened and you are at an increased risk for chlamydia or gonorrhea. Ask your health care provider if you are at risk.  If you are at risk of being infected with HIV, it is recommended that you take a prescription medicine daily to prevent HIV infection. This is  called preexposure prophylaxis (PrEP). You are considered at risk if:  You are a heterosexual woman, are sexually active, and are at increased risk for HIV infection.  You take drugs by injection.  You are sexually active with a partner who has HIV.  Talk with your health care provider about whether you are at high risk of being infected with HIV. If you choose to begin PrEP, you should first be tested for HIV. You should then be tested every 3 months for as long as you are taking PrEP.  Osteoporosis is a disease in which the bones lose minerals and strength   with aging. This can result in serious bone fractures or breaks. The risk of osteoporosis can be identified using a bone density scan. Women ages 65 years and over and women at risk for fractures or osteoporosis should discuss screening with their health care providers. Ask your health care provider whether you should take a calcium supplement or vitamin D to reduce the rate of osteoporosis.  Menopause can be associated with physical symptoms and risks. Hormone replacement therapy is available to decrease symptoms and risks. You should talk to your health care provider about whether hormone replacement therapy is right for you.  Use sunscreen. Apply sunscreen liberally and repeatedly throughout the day. You should seek shade when your shadow is shorter than you. Protect yourself by wearing long sleeves, pants, a wide-brimmed hat, and sunglasses year round, whenever you are outdoors.  Once a month, do a whole body skin exam, using a mirror to look at the skin on your back. Tell your health care provider of new moles, moles that have irregular borders, moles that are larger than a pencil eraser, or moles that have changed in shape or color.  Stay current with required vaccines (immunizations).  Influenza vaccine. All adults should be immunized every year.  Tetanus, diphtheria, and acellular pertussis (Td, Tdap) vaccine. Pregnant women should  receive 1 dose of Tdap vaccine during each pregnancy. The dose should be obtained regardless of the length of time since the last dose. Immunization is preferred during the 27th-36th week of gestation. An adult who has not previously received Tdap or who does not know her vaccine status should receive 1 dose of Tdap. This initial dose should be followed by tetanus and diphtheria toxoids (Td) booster doses every 10 years. Adults with an unknown or incomplete history of completing a 3-dose immunization series with Td-containing vaccines should begin or complete a primary immunization series including a Tdap dose. Adults should receive a Td booster every 10 years.  Varicella vaccine. An adult without evidence of immunity to varicella should receive 2 doses or a second dose if she has previously received 1 dose. Pregnant females who do not have evidence of immunity should receive the first dose after pregnancy. This first dose should be obtained before leaving the health care facility. The second dose should be obtained 4-8 weeks after the first dose.  Human papillomavirus (HPV) vaccine. Females aged 13-26 years who have not received the vaccine previously should obtain the 3-dose series. The vaccine is not recommended for use in pregnant females. However, pregnancy testing is not needed before receiving a dose. If a female is found to be pregnant after receiving a dose, no treatment is needed. In that case, the remaining doses should be delayed until after the pregnancy. Immunization is recommended for any person with an immunocompromised condition through the age of 26 years if she did not get any or all doses earlier. During the 3-dose series, the second dose should be obtained 4-8 weeks after the first dose. The third dose should be obtained 24 weeks after the first dose and 16 weeks after the second dose.  Zoster vaccine. One dose is recommended for adults aged 60 years or older unless certain conditions are  present.  Measles, mumps, and rubella (MMR) vaccine. Adults born before 1957 generally are considered immune to measles and mumps. Adults born in 1957 or later should have 1 or more doses of MMR vaccine unless there is a contraindication to the vaccine or there is laboratory evidence of immunity to   each of the three diseases. A routine second dose of MMR vaccine should be obtained at least 28 days after the first dose for students attending postsecondary schools, health care workers, or international travelers. People who received inactivated measles vaccine or an unknown type of measles vaccine during 1963-1967 should receive 2 doses of MMR vaccine. People who received inactivated mumps vaccine or an unknown type of mumps vaccine before 1979 and are at high risk for mumps infection should consider immunization with 2 doses of MMR vaccine. For females of childbearing age, rubella immunity should be determined. If there is no evidence of immunity, females who are not pregnant should be vaccinated. If there is no evidence of immunity, females who are pregnant should delay immunization until after pregnancy. Unvaccinated health care workers born before 1957 who lack laboratory evidence of measles, mumps, or rubella immunity or laboratory confirmation of disease should consider measles and mumps immunization with 2 doses of MMR vaccine or rubella immunization with 1 dose of MMR vaccine.  Pneumococcal 13-valent conjugate (PCV13) vaccine. When indicated, a person who is uncertain of her immunization history and has no record of immunization should receive the PCV13 vaccine. An adult aged 19 years or older who has certain medical conditions and has not been previously immunized should receive 1 dose of PCV13 vaccine. This PCV13 should be followed with a dose of pneumococcal polysaccharide (PPSV23) vaccine. The PPSV23 vaccine dose should be obtained at least 8 weeks after the dose of PCV13 vaccine. An adult aged 19  years or older who has certain medical conditions and previously received 1 or more doses of PPSV23 vaccine should receive 1 dose of PCV13. The PCV13 vaccine dose should be obtained 1 or more years after the last PPSV23 vaccine dose.  Pneumococcal polysaccharide (PPSV23) vaccine. When PCV13 is also indicated, PCV13 should be obtained first. All adults aged 65 years and older should be immunized. An adult younger than age 65 years who has certain medical conditions should be immunized. Any person who resides in a nursing home or long-term care facility should be immunized. An adult smoker should be immunized. People with an immunocompromised condition and certain other conditions should receive both PCV13 and PPSV23 vaccines. People with human immunodeficiency virus (HIV) infection should be immunized as soon as possible after diagnosis. Immunization during chemotherapy or radiation therapy should be avoided. Routine use of PPSV23 vaccine is not recommended for American Indians, [place] Natives, or people younger than 65 years unless there are medical conditions that require PPSV23 vaccine. When indicated, people who have unknown immunization and have no record of immunization should receive PPSV23 vaccine. One-time revaccination 5 years after the first dose of PPSV23 is recommended for people aged 19-64 years who have chronic kidney failure, nephrotic syndrome, asplenia, or immunocompromised conditions. People who received 1-2 doses of PPSV23 before age 65 years should receive another dose of PPSV23 vaccine at age 65 years or later if at least 5 years have passed since the previous dose. Doses of PPSV23 are not needed for people immunized with PPSV23 at or after age 65 years.  Meningococcal vaccine. Adults with asplenia or persistent complement component deficiencies should receive 2 doses of quadrivalent meningococcal conjugate (MenACWY-D) vaccine. The doses should be obtained at least 2 months apart.  Microbiologists working with certain meningococcal bacteria, military recruits, people at risk during an outbreak, and people who travel to or live in countries with a high rate of meningitis should be immunized. A first-year college student up through age   21 years who is living in a residence hall should receive a dose if she did not receive a dose on or after her 16th birthday. Adults who have certain high-risk conditions should receive one or more doses of vaccine.  Hepatitis A vaccine. Adults who wish to be protected from this disease, have certain high-risk conditions, work with hepatitis A-infected animals, work in hepatitis A research labs, or travel to or work in countries with a high rate of hepatitis A should be immunized. Adults who were previously unvaccinated and who anticipate close contact with an international adoptee during the first 60 days after arrival in the [place] from a country with a high rate of hepatitis A should be immunized.  Hepatitis B vaccine. Adults who wish to be protected from this disease, have certain high-risk conditions, may be exposed to blood or other infectious body fluids, are household contacts or sex partners of hepatitis B positive people, are clients or workers in certain care facilities, or travel to or work in countries with a high rate of hepatitis B should be immunized.  Haemophilus influenzae type b (Hib) vaccine. A previously unvaccinated person with asplenia or sickle cell disease or having a scheduled splenectomy should receive 1 dose of Hib vaccine. Regardless of previous immunization, a recipient of a hematopoietic stem cell transplant should receive a 3-dose series 6-12 months after her successful transplant. Hib vaccine is not recommended for adults with HIV infection. Preventive Services / Frequency Ages 64 to 68 years  Blood pressure check.** / Every 1 to 2 years.  Lipid and cholesterol check.** / Every 5 years beginning at age  22.  Clinical breast exam.** / Every 3 years for women in their 88s and 53s.  BRCA-related cancer risk assessment.** / For women who have family members with a BRCA-related cancer (breast, ovarian, tubal, or peritoneal cancers).  Pap test.** / Every 2 years from ages 90 through 51. Every 3 years starting at age 21 through age 56 or 3 with a history of 3 consecutive normal Pap tests.  HPV screening.** / Every 3 years from ages 24 through ages 1 to 46 with a history of 3 consecutive normal Pap tests.  Hepatitis C blood test.** / For any individual with known risks for hepatitis C.  Skin self-exam. / Monthly.  Influenza vaccine. / Every year.  Tetanus, diphtheria, and acellular pertussis (Tdap, Td) vaccine.** / Consult your health care provider. Pregnant women should receive 1 dose of Tdap vaccine during each pregnancy. 1 dose of Td every 10 years.  Varicella vaccine.** / Consult your health care provider. Pregnant females who do not have evidence of immunity should receive the first dose after pregnancy.  HPV vaccine. / 3 doses over 6 months, if 72 and younger. The vaccine is not recommended for use in pregnant females. However, pregnancy testing is not needed before receiving a dose.  Measles, mumps, rubella (MMR) vaccine.** / You need at least 1 dose of MMR if you were born in 1957 or later. You may also need a 2nd dose. For females of childbearing age, rubella immunity should be determined. If there is no evidence of immunity, females who are not pregnant should be vaccinated. If there is no evidence of immunity, females who are pregnant should delay immunization until after pregnancy.  Pneumococcal 13-valent conjugate (PCV13) vaccine.** / Consult your health care provider.  Pneumococcal polysaccharide (PPSV23) vaccine.** / 1 to 2 doses if you smoke cigarettes or if you have certain conditions.  Meningococcal vaccine.** /  1 dose if you are age 19 to 21 years and a first-year college  student living in a residence hall, or have one of several medical conditions, you need to get vaccinated against meningococcal disease. You may also need additional booster doses.  Hepatitis A vaccine.** / Consult your health care provider.  Hepatitis B vaccine.** / Consult your health care provider.  Haemophilus influenzae type b (Hib) vaccine.** / Consult your health care provider. Ages 40 to 64 years  Blood pressure check.** / Every 1 to 2 years.  Lipid and cholesterol check.** / Every 5 years beginning at age 20 years.  Lung cancer screening. / Every year if you are aged 55-80 years and have a 30-pack-year history of smoking and currently smoke or have quit within the past 15 years. Yearly screening is stopped once you have quit smoking for at least 15 years or develop a health problem that would prevent you from having lung cancer treatment.  Clinical breast exam.** / Every year after age 40 years.  BRCA-related cancer risk assessment.** / For women who have family members with a BRCA-related cancer (breast, ovarian, tubal, or peritoneal cancers).  Mammogram.** / Every year beginning at age 40 years and continuing for as long as you are in good health. Consult with your health care provider.  Pap test.** / Every 3 years starting at age 30 years through age 65 or 70 years with a history of 3 consecutive normal Pap tests.  HPV screening.** / Every 3 years from ages 30 years through ages 65 to 70 years with a history of 3 consecutive normal Pap tests.  Fecal occult blood test (FOBT) of stool. / Every year beginning at age 50 years and continuing until age 75 years. You may not need to do this test if you get a colonoscopy every 10 years.  Flexible sigmoidoscopy or colonoscopy.** / Every 5 years for a flexible sigmoidoscopy or every 10 years for a colonoscopy beginning at age 50 years and continuing until age 75 years.  Hepatitis C blood test.** / For all people born from 1945 through  1965 and any individual with known risks for hepatitis C.  Skin self-exam. / Monthly.  Influenza vaccine. / Every year.  Tetanus, diphtheria, and acellular pertussis (Tdap/Td) vaccine.** / Consult your health care provider. Pregnant women should receive 1 dose of Tdap vaccine during each pregnancy. 1 dose of Td every 10 years.  Varicella vaccine.** / Consult your health care provider. Pregnant females who do not have evidence of immunity should receive the first dose after pregnancy.  Zoster vaccine.** / 1 dose for adults aged 60 years or older.  Measles, mumps, rubella (MMR) vaccine.** / You need at least 1 dose of MMR if you were born in 1957 or later. You may also need a 2nd dose. For females of childbearing age, rubella immunity should be determined. If there is no evidence of immunity, females who are not pregnant should be vaccinated. If there is no evidence of immunity, females who are pregnant should delay immunization until after pregnancy.  Pneumococcal 13-valent conjugate (PCV13) vaccine.** / Consult your health care provider.  Pneumococcal polysaccharide (PPSV23) vaccine.** / 1 to 2 doses if you smoke cigarettes or if you have certain conditions.  Meningococcal vaccine.** / Consult your health care provider.  Hepatitis A vaccine.** / Consult your health care provider.  Hepatitis B vaccine.** / Consult your health care provider.  Haemophilus influenzae type b (Hib) vaccine.** / Consult your health care provider. Ages 65   years and over  Blood pressure check.** / Every 1 to 2 years.  Lipid and cholesterol check.** / Every 5 years beginning at age 22 years.  Lung cancer screening. / Every year if you are aged 73-80 years and have a 30-pack-year history of smoking and currently smoke or have quit within the past 15 years. Yearly screening is stopped once you have quit smoking for at least 15 years or develop a health problem that would prevent you from having lung cancer  treatment.  Clinical breast exam.** / Every year after age 4 years.  BRCA-related cancer risk assessment.** / For women who have family members with a BRCA-related cancer (breast, ovarian, tubal, or peritoneal cancers).  Mammogram.** / Every year beginning at age 40 years and continuing for as long as you are in good health. Consult with your health care provider.  Pap test.** / Every 3 years starting at age 9 years through age 34 or 91 years with 3 consecutive normal Pap tests. Testing can be stopped between 65 and 70 years with 3 consecutive normal Pap tests and no abnormal Pap or HPV tests in the past 10 years.  HPV screening.** / Every 3 years from ages 57 years through ages 64 or 45 years with a history of 3 consecutive normal Pap tests. Testing can be stopped between 65 and 70 years with 3 consecutive normal Pap tests and no abnormal Pap or HPV tests in the past 10 years.  Fecal occult blood test (FOBT) of stool. / Every year beginning at age 15 years and continuing until age 17 years. You may not need to do this test if you get a colonoscopy every 10 years.  Flexible sigmoidoscopy or colonoscopy.** / Every 5 years for a flexible sigmoidoscopy or every 10 years for a colonoscopy beginning at age 86 years and continuing until age 71 years.  Hepatitis C blood test.** / For all people born from [birth year] through 1963 and any individual with known risks for hepatitis C.  Osteoporosis screening.** / A one-time screening for women ages 83 years and over and women at risk for fractures or osteoporosis.  Skin self-exam. / Monthly.  Influenza vaccine. / Every year.  Tetanus, diphtheria, and acellular pertussis (Tdap/Td) vaccine.** / 1 dose of Td every 10 years.  Varicella vaccine.** / Consult your health care provider.  Zoster vaccine.** / 1 dose for adults aged 61 years or older.  Pneumococcal 13-valent conjugate (PCV13) vaccine.** / Consult your health care provider.  Pneumococcal  polysaccharide (PPSV23) vaccine.** / 1 dose for all adults aged 28 years and older.  Meningococcal vaccine.** / Consult your health care provider.  Hepatitis A vaccine.** / Consult your health care provider.  Hepatitis B vaccine.** / Consult your health care provider.  Haemophilus influenzae type b (Hib) vaccine.** / Consult your health care provider. ** Family history and personal history of risk and conditions may change your health care provider's recommendations. Document Released: 03/04/2001 Document Revised: 05/23/2013 Document Reviewed: 06/03/2010 [facility] Patient Information 2015 [place], [place]. This information is not intended to replace advice given to you by your health care provider. Make sure you discuss any questions you have with your health care provider.

## 2013-09-29 NOTE — Progress Notes (Signed)
Subjective:     Nicole Richard is a 58 y.o. female and is here for a comprehensive physical exam. The patient reports no problems.  History   Social History  . Marital Status: Divorced    Spouse Name: N/A    Number of Children: 2  . Years of Education: N/A   Occupational History  . [employer]--LPN   .    [name]     Social History Main Topics  . Smoking status: Never Smoker   . Smokeless tobacco: Never Used  . Alcohol Use: Yes     Comment: rare  . Drug Use: No  . Sexual Activity: Not Currently    Partners: Male   Other Topics Concern  . Not on file   Social History Narrative   Exercise--no   Health Maintenance  Topic Date Due  . Influenza Vaccine  09/30/2014 (Originally 08/20/2013)  . Pap Smear  09/18/2014  . Mammogram  09/08/2015  . Colonoscopy  08/16/2017  . Tetanus/tdap  10/19/2022    The following portions of the patient's history were reviewed and updated as appropriate:  She  has a past medical history of Hypertension; Hyperlipidemia; GERD (gastroesophageal reflux disease); Asthma; and Allergy. She  does not have any pertinent problems on file. She  has past surgical history that includes Tubal ligation; Appendectomy; Abdominal hysterectomy (08/2004); Tonsillectomy; Breast enhancement surgery (2001); Colonoscopy; and Polypectomy (2009). Her family history includes Asthma in an other family member; Breast cancer in her maternal grandmother; Cervical cancer in her maternal grandmother; Colon cancer in her maternal grandmother; Colon polyps in her father; Coronary artery disease in an other family member; Diabetes in her father; Hyperlipidemia in an other family member; Hypertension in an other family member; Irritable bowel syndrome in her mother; Kidney disease in her father; Rheumatic fever in her mother; Uterine cancer in her maternal grandmother. There is no history of Esophageal cancer, Rectal cancer, or Stomach cancer. She  reports that she has never smoked.  She has never used smokeless tobacco. She reports that she drinks alcohol. She reports that she does not use illicit drugs. She has a current medication list which includes the following prescription(s): aspirin, atenolol, atorvastatin, calcium citrate-vitamin d, cholecalciferol, esomeprazole, flaxseed oil, hyoscyamine sulfate, ipratropium-albuterol, mometasone, montelukast, multivitamin with minerals, and propylene glycol. Current Outpatient Prescriptions on File Prior to Visit  Medication Sig Dispense Refill  . aspirin 81 MG tablet Take 81 mg by mouth daily.      . calcium citrate-vitamin D (CITRACAL+D) 315-200 MG-UNIT per tablet Take 1 tablet by mouth daily.       . Cholecalciferol (VITAMIN D PO) Take 600 Units by mouth daily.        . Flaxseed, Linseed, (FLAXSEED OIL) 1200 MG CAPS Take 1 capsule by mouth daily.      [name] Hyoscyamine Sulfate 0.375 MG TBCR Take one tab twice a day for 5 days then as needed abdominal pain  25 tablet  1  . Multiple Vitamin (MULTIVITAMIN WITH MINERALS) TABS tablet Take 1 tablet by mouth daily.      [name] Propylene Glycol (SYSTANE BALANCE) 0.6 % SOLN Apply 1 drop to eye as needed.       No current facility-administered medications on file prior to visit.   She is allergic to contrast media; metamucil; and mustard seed..  Review of Systems Review of Systems  Constitutional: Negative for activity change, appetite change and fatigue.  HENT: Negative for hearing loss, congestion, tinnitus and ear discharge.  dentist  q85m Eyes: Negative for visual disturbance (see optho q1y -- vision corrected to 20/20 with glasses).  Respiratory: Negative for cough, chest tightness and shortness of breath.   Cardiovascular: Negative for chest pain, palpitations and leg swelling.  Gastrointestinal: Negative for abdominal pain, diarrhea, constipation and abdominal distention.  Genitourinary: Negative for urgency, frequency, decreased urine volume and difficulty urinating.  Musculoskeletal:  Negative for back pain, arthralgias and gait problem.  Skin: Negative for color change, pallor and rash.  Neurological: Negative for dizziness, light-headedness, numbness and headaches.  Hematological: Negative for adenopathy. Does not bruise/bleed easily.  Psychiatric/Behavioral: Negative for suicidal ideas, confusion, sleep disturbance, self-injury, dysphoric mood, decreased concentration and agitation.       Objective:    BP 131/83  Pulse 69  Temp(Src) 98.7 F (37.1 C) (Oral)  Ht 5' (1.524 m)  Wt 174 lb 9.7 oz (79.2 kg)  BMI 34.10 kg/m2  SpO2 95% General appearance: alert, cooperative, appears stated age and no distress Head: Normocephalic, without obvious abnormality, atraumatic Eyes: conjunctivae/corneas clear. PERRL, EOM's intact. Fundi benign. Ears: normal TM's and external ear canals both ears Nose: Nares normal. Septum midline. Mucosa normal. No drainage or sinus tenderness. Throat: lips, mucosa, and tongue normal; teeth and gums normal Neck: no adenopathy, no carotid bruit, no JVD, supple, symmetrical, trachea midline and thyroid not enlarged, symmetric, no tenderness/mass/nodules Back: symmetric, no curvature. ROM normal. No CVA tenderness. Lungs: clear to auscultation bilaterally Breasts: normal appearance, no masses or tenderness Heart: regular rate and rhythm, S1, S2 normal, no murmur, click, rub or gallop Abdomen: soft, non-tender; bowel sounds normal; no masses,  no organomegaly Pelvic: cervix normal in appearance, external genitalia normal, no adnexal masses or tenderness, no cervical motion tenderness, rectovaginal septum normal, uterus normal size, shape, and consistency, vagina normal without discharge and pap done Extremities: extremities normal, atraumatic, no cyanosis or edema Pulses: 2+ and symmetric Skin: Skin color, texture, turgor normal. No rashes or lesions Lymph nodes: Cervical, supraclavicular, and axillary nodes normal. Neurologic: Alert and  oriented X 3, normal strength and tone. Normal symmetric reflexes. Normal coordination and gait Psych--no depression, no anxiety       Assessment:    Healthy female exam.      Plan:    ghm utd Check labs See After Visit Summary for Counseling Recommendations    1. Gastroesophageal reflux disease without esophagitis stable - esomeprazole (NEXIUM) 40 MG capsule; Take 1 capsule (40 mg total) by mouth daily.  Dispense: 90 capsule; Refill: 3  2. Essential hypertension stable - atenolol (TENORMIN) 25 MG tablet; Take 1 tablet by mouth daily--  Dispense: 90 tablet; Refill: 3 - Basic metabolic panel; Future - CBC with Differential; Future - Hepatic function panel; Future - Lipid panel; Future - POCT urinalysis dipstick; Future - TSH; Future  3. Seasonal allergies Check labs - mometasone (NASONEX) 50 MCG/ACT nasal spray; Place 2 sprays into the nose daily.  Dispense: 17 g; Refill: 2  4. Unspecified asthma(493.90) Stable--renew meds - montelukast (SINGULAIR) 10 MG tablet; TAKE 1 TABLET DAILY  Dispense: 90 tablet; Refill: 3 - Ipratropium-Albuterol (COMBIVENT RESPIMAT) 20-100 MCG/ACT AERS respimat; Inhale 1 puff into the lungs 4 (four) times daily.  Dispense: 1 Inhaler; Refill: 3  5. Other and unspecified hyperlipidemia  - atorvastatin (LIPITOR) 20 MG tablet; Take 1 tablet (20 mg total) by mouth daily.  Dispense: 90 tablet; Refill: 1 - Basic metabolic panel; Future - CBC with Differential; Future - Hepatic function panel; Future - Lipid panel; Future - POCT urinalysis dipstick; Future -  TSH; Future  6. Preventative health care  - Basic metabolic panel; Future - CBC with Differential; Future - Hepatic function panel; Future - Lipid panel; Future - POCT urinalysis dipstick; Future - TSH; Future  7. Hyperglycemia  - Hemoglobin A1c

## 2013-09-29 NOTE — Progress Notes (Signed)
Pre visit review using our clinic review tool, if applicable. No additional management support is needed unless otherwise documented below in the visit note. 

## 2013-10-04 LAB — CYTOLOGY - PAP

## 2013-10-05 ENCOUNTER — Other Ambulatory Visit (INDEPENDENT_AMBULATORY_CARE_PROVIDER_SITE_OTHER): Payer: Managed Care, Other (non HMO)

## 2013-10-05 DIAGNOSIS — R739 Hyperglycemia, unspecified: Secondary | ICD-10-CM

## 2013-10-05 DIAGNOSIS — Z0000 Encounter for general adult medical examination without abnormal findings: Secondary | ICD-10-CM

## 2013-10-05 DIAGNOSIS — E785 Hyperlipidemia, unspecified: Secondary | ICD-10-CM

## 2013-10-05 DIAGNOSIS — I10 Essential (primary) hypertension: Secondary | ICD-10-CM

## 2013-10-05 DIAGNOSIS — R7309 Other abnormal glucose: Secondary | ICD-10-CM

## 2013-10-05 LAB — HEMOGLOBIN A1C: Hgb A1c MFr Bld: 9.9 % — ABNORMAL HIGH (ref 4.6–6.5)

## 2013-10-06 LAB — HEPATIC FUNCTION PANEL
ALT: 49 U/L — ABNORMAL HIGH (ref 0–35)
AST: 31 U/L (ref 0–37)
Albumin: 4.3 g/dL (ref 3.5–5.2)
Alkaline Phosphatase: 150 U/L — ABNORMAL HIGH (ref 39–117)
BILIRUBIN DIRECT: 0.1 mg/dL (ref 0.0–0.3)
BILIRUBIN TOTAL: 1.2 mg/dL (ref 0.2–1.2)
Total Protein: 7.7 g/dL (ref 6.0–8.3)

## 2013-10-06 LAB — CBC WITH DIFFERENTIAL/PLATELET
Basophils Absolute: 0 10*3/uL (ref 0.0–0.1)
Basophils Relative: 0.4 % (ref 0.0–3.0)
EOS ABS: 0.2 10*3/uL (ref 0.0–0.7)
EOS PCT: 3.6 % (ref 0.0–5.0)
HCT: 43.9 % (ref 36.0–46.0)
Hemoglobin: 14.7 g/dL (ref 12.0–15.0)
Lymphocytes Relative: 32.4 % (ref 12.0–46.0)
Lymphs Abs: 2 10*3/uL (ref 0.7–4.0)
MCHC: 33.6 g/dL (ref 30.0–36.0)
MCV: 89.1 fl (ref 78.0–100.0)
MONO ABS: 0.5 10*3/uL (ref 0.1–1.0)
Monocytes Relative: 7.7 % (ref 3.0–12.0)
NEUTROS PCT: 55.9 % (ref 43.0–77.0)
Neutro Abs: 3.5 10*3/uL (ref 1.4–7.7)
PLATELETS: 255 10*3/uL (ref 150.0–400.0)
RBC: 4.92 Mil/uL (ref 3.87–5.11)
RDW: 13 % (ref 11.5–15.5)
WBC: 6.3 10*3/uL (ref 4.0–10.5)

## 2013-10-06 LAB — LIPID PANEL
CHOL/HDL RATIO: 5
Cholesterol: 198 mg/dL (ref 0–200)
HDL: 41.5 mg/dL (ref >39.00)
NonHDL: 156.5
TRIGLYCERIDES: 266 mg/dL — AB (ref 0.0–149.0)
VLDL: 53.2 mg/dL — ABNORMAL HIGH (ref 0.0–40.0)

## 2013-10-06 LAB — TSH: TSH: 1.62 u[IU]/mL (ref 0.35–4.50)

## 2013-10-06 LAB — BASIC METABOLIC PANEL
BUN: 20 mg/dL (ref 6–23)
CO2: 29 mEq/L (ref 19–32)
CREATININE: 0.8 mg/dL (ref 0.4–1.2)
Calcium: 9.7 mg/dL (ref 8.4–10.5)
Chloride: 103 mEq/L (ref 96–112)
GFR: 81.13 mL/min (ref >60.00)
Glucose, Bld: 284 mg/dL — ABNORMAL HIGH (ref 70–99)
Potassium: 4.8 mEq/L (ref 3.5–5.1)
Sodium: 139 mEq/L (ref 135–145)

## 2013-10-06 LAB — LDL CHOLESTEROL, DIRECT: Direct LDL: 128.1 mg/dL

## 2013-10-21 MED ORDER — METFORMIN HCL ER 500 MG PO TB24: 500.0000 mg | ORAL_TABLET | Freq: Every evening | ORAL | Status: DC

## 2013-10-21 MED ORDER — FENOFIBRATE 160 MG PO TABS: 160.0000 mg | ORAL_TABLET | Freq: Every day | ORAL | Status: DC

## 2013-11-29 ENCOUNTER — Ambulatory Visit (INDEPENDENT_AMBULATORY_CARE_PROVIDER_SITE_OTHER): Payer: Managed Care, Other (non HMO) | Admitting: Family Medicine

## 2013-11-29 ENCOUNTER — Encounter: Payer: Self-pay | Admitting: Family Medicine

## 2013-11-29 VITALS — BP 118/74 | HR 86 | Temp 98.5°F | Wt 174.0 lb

## 2013-11-29 DIAGNOSIS — E785 Hyperlipidemia, unspecified: Secondary | ICD-10-CM

## 2013-11-29 DIAGNOSIS — E1165 Type 2 diabetes mellitus with hyperglycemia: Secondary | ICD-10-CM

## 2013-11-29 DIAGNOSIS — IMO0002 Reserved for concepts with insufficient information to code with codable children: Secondary | ICD-10-CM

## 2013-11-29 MED ORDER — ONETOUCH DELICA LANCETS 33G MISC: Status: DC

## 2013-11-29 MED ORDER — METFORMIN HCL ER 500 MG PO TB24: ORAL_TABLET | ORAL | Status: DC

## 2013-11-29 MED ORDER — GLUCOSE BLOOD VI STRP: ORAL_STRIP | Status: DC

## 2013-11-29 NOTE — Progress Notes (Signed)
Pre visit review using our clinic review tool, if applicable. No additional management support is needed unless otherwise documented below in the visit note. 

## 2013-11-29 NOTE — Progress Notes (Signed)
   Subjective:    Patient ID: Nicole Richard, female    DOB: July 04, 1955, 58 y.o.   MRN: [id number]  HPI Pt here because she has been having signs of elevated bld sugar.  Blurry vision , fatigue and some dizziness.  She had her bld sugar checked at work and it was high. No other symptoms   Review of Systems    as above Objective:   Physical Exam  BP 118/74 mmHg  Pulse 86  Temp(Src) 98.5 F (36.9 C) (Oral)  Wt 174 lb (78.926 kg)  SpO2 95% General appearance: alert, cooperative, appears stated age and no distress Lungs: clear to auscultation bilaterally Heart: regular rate and rhythm, S1, S2 normal, no murmur, click, rub or gallop Extremities: extremities normal, atraumatic, no cyanosis or edema      Assessment & Plan:  1. DM (diabetes mellitus), type 2, uncontrolled Inc metformin to 2 qpm-- recheck labs 3 months Glucometer given to the pt - glucose blood (ONE TOUCH ULTRA TEST) test strip; Use as instructed  Dispense: 100 each; Refill: 12 - ONETOUCH DELICA LANCETS 56Y MISC; As directed  Dispense: 100 each; Refill: 11 - metFORMIN (GLUCOPHAGE XR) 500 MG 24 hr tablet; 2 po qhs  Dispense: 60 tablet; Refill: 2 - Basic metabolic panel; Future - Hemoglobin A1c; Future - Hepatic function panel; Future - Lipid panel; Future - Microalbumin / creatinine urine ratio; Future - POCT urinalysis dipstick; Future  2. Hyperlipidemia   - Basic metabolic panel; Future - Hemoglobin A1c; Future - Hepatic function panel; Future - Lipid panel; Future - Microalbumin / creatinine urine ratio; Future - POCT urinalysis dipstick; Future

## 2013-11-29 NOTE — Patient Instructions (Signed)
Diabetes and Standards of Medical Care Diabetes is complicated. You may find that your diabetes team includes a dietitian, nurse, diabetes educator, eye doctor, and more. To help everyone know what is going on and to help you get the care you deserve, the following schedule of care was developed to help keep you on track. Below are the tests, exams, vaccines, medicines, education, and plans you will need. HbA1c test This test shows how well you have controlled your glucose over the past 2-3 months. It is used to see if your diabetes management plan needs to be adjusted.   It is performed at least 2 times a year if you are meeting treatment goals.  It is performed 4 times a year if therapy has changed or if you are not meeting treatment goals. Blood pressure test  This test is performed at every routine medical visit. The goal is less than 140/90 mm Hg for most people, but 130/80 mm Hg in some cases. Ask your health care provider about your goal. Dental exam  Follow up with the dentist regularly. Eye exam  If you are diagnosed with type 1 diabetes as a child, get an exam upon reaching the age of 37 years or older and have had diabetes for 3-5 years. Yearly eye exams are recommended after that initial eye exam.  If you are diagnosed with type 1 diabetes as an adult, get an exam within 5 years of diagnosis and then yearly.  If you are diagnosed with type 2 diabetes, get an exam as soon as possible after the diagnosis and then yearly. Foot care exam  Visual foot exams are performed at every routine medical visit. The exams check for cuts, injuries, or other problems with the feet.  A comprehensive foot exam should be done yearly. This includes visual inspection as well as assessing foot pulses and testing for loss of sensation.  Check your feet nightly for cuts, injuries, or other problems with your feet. Tell your health care provider if anything is not healing. Kidney function test (urine  microalbumin)  This test is performed once a year.  Type 1 diabetes: The first test is performed 5 years after diagnosis.  Type 2 diabetes: The first test is performed at the time of diagnosis.  A serum creatinine and estimated glomerular filtration rate (eGFR) test is done once a year to assess the level of chronic kidney disease (CKD), if present. Lipid profile (cholesterol, HDL, LDL, triglycerides)  Performed every 5 years for most people.  The goal for LDL is less than 100 mg/dL. If you are at high risk, the goal is less than 70 mg/dL.  The goal for HDL is 40 mg/dL-50 mg/dL for men and 50 mg/dL-60 mg/dL for women. An HDL cholesterol of 60 mg/dL or higher gives some protection against heart disease.  The goal for triglycerides is less than 150 mg/dL. Influenza vaccine, pneumococcal vaccine, and hepatitis B vaccine  The influenza vaccine is recommended yearly.  It is recommended that people with diabetes who are over 24 years old get the pneumonia vaccine. In some cases, two separate shots may be given. Ask your health care provider if your pneumonia vaccination is up to date.  The hepatitis B vaccine is also recommended for adults with diabetes. Diabetes self-management education  Education is recommended at diagnosis and ongoing as needed. Treatment plan  Your treatment plan is reviewed at every medical visit. Document Released: 11/03/2008 Document Revised: 05/23/2013 Document Reviewed: 06/08/2012 [facility] Patient Information 2015 [place],  [employer]. This information is not intended to replace advice given to you by your health care provider. Make sure you discuss any questions you have with your health care provider.  

## 2013-12-26 ENCOUNTER — Other Ambulatory Visit (INDEPENDENT_AMBULATORY_CARE_PROVIDER_SITE_OTHER): Payer: Managed Care, Other (non HMO)

## 2013-12-26 DIAGNOSIS — IMO0002 Reserved for concepts with insufficient information to code with codable children: Secondary | ICD-10-CM

## 2013-12-26 DIAGNOSIS — E785 Hyperlipidemia, unspecified: Secondary | ICD-10-CM

## 2013-12-26 DIAGNOSIS — E1165 Type 2 diabetes mellitus with hyperglycemia: Secondary | ICD-10-CM

## 2013-12-26 LAB — HEPATIC FUNCTION PANEL
ALBUMIN: 4.3 g/dL (ref 3.5–5.2)
ALT: 83 U/L — ABNORMAL HIGH (ref 0–35)
AST: 48 U/L — ABNORMAL HIGH (ref 0–37)
Alkaline Phosphatase: 103 U/L (ref 39–117)
Bilirubin, Direct: 0 mg/dL (ref 0.0–0.3)
Total Bilirubin: 0.4 mg/dL (ref 0.2–1.2)
Total Protein: 7.4 g/dL (ref 6.0–8.3)

## 2013-12-26 LAB — POCT URINALYSIS DIPSTICK
BILIRUBIN UA: NEGATIVE
Blood, UA: NEGATIVE
KETONES UA: NEGATIVE
LEUKOCYTES UA: NEGATIVE
Nitrite, UA: NEGATIVE
Urobilinogen, UA: 0.2
pH, UA: 5

## 2013-12-26 LAB — BASIC METABOLIC PANEL
BUN: 22 mg/dL (ref 6–23)
CHLORIDE: 108 meq/L (ref 96–112)
CO2: 28 mEq/L (ref 19–32)
CREATININE: 1.2 mg/dL (ref 0.4–1.2)
Calcium: 9.9 mg/dL (ref 8.4–10.5)
GFR: 51.28 mL/min — AB (ref >60.00)
Glucose, Bld: 199 mg/dL — ABNORMAL HIGH (ref 70–99)
Potassium: 4.5 mEq/L (ref 3.5–5.1)
Sodium: 143 mEq/L (ref 135–145)

## 2013-12-26 LAB — LIPID PANEL
CHOLESTEROL: 158 mg/dL (ref 0–200)
HDL: 42.6 mg/dL (ref >39.00)
LDL CALC: 84 mg/dL (ref 0–99)
NonHDL: 115.4
TRIGLYCERIDES: 158 mg/dL — AB (ref 0.0–149.0)
Total CHOL/HDL Ratio: 4
VLDL: 31.6 mg/dL (ref 0.0–40.0)

## 2013-12-26 LAB — HEMOGLOBIN A1C: HEMOGLOBIN A1C: 9.7 % — AB (ref 4.6–6.5)

## 2013-12-26 LAB — MICROALBUMIN / CREATININE URINE RATIO
Creatinine,U: 250 mg/dL
Microalb Creat Ratio: 0.4 mg/g (ref 0.0–30.0)
Microalb, Ur: 1.1 mg/dL (ref 0.0–1.9)

## 2013-12-27 ENCOUNTER — Other Ambulatory Visit: Payer: Self-pay | Admitting: Family Medicine

## 2013-12-27 DIAGNOSIS — E1165 Type 2 diabetes mellitus with hyperglycemia: Secondary | ICD-10-CM

## 2013-12-27 DIAGNOSIS — IMO0002 Reserved for concepts with insufficient information to code with codable children: Secondary | ICD-10-CM

## 2013-12-28 ENCOUNTER — Telehealth: Payer: Self-pay

## 2013-12-28 DIAGNOSIS — E118 Type 2 diabetes mellitus with unspecified complications: Secondary | ICD-10-CM

## 2013-12-28 MED ORDER — SITAGLIPTIN PHOS-METFORMIN HCL 50-1000 MG PO TABS: 1.0000 | ORAL_TABLET | Freq: Two times a day (BID) | ORAL | Status: DC

## 2013-12-28 NOTE — Addendum Note (Signed)
Addended by: Ewing Schlein on: 12/28/2013 10:34 AM   Modules accepted: Orders

## 2013-12-28 NOTE — Telephone Encounter (Signed)
Spoke with patient and she voiced understanding and has agreed to see Endo and she will stop the Metformin and start the [place]. Rx has been faxed and I made her aware to check her My-chart to review the labs.       KP

## 2013-12-28 NOTE — Telephone Encounter (Signed)
-----   Message from Rosalita Chessman, DO sent at 12/27/2013  6:43 AM EST ----- DM not controlled---  change metformin to janumet xr 50/1000  2 a day #60 , 2 refills---and we will refer to endocrinology Liver enzymes are slightly worse--- con't meds for now Diet and exercise improtant to try to get Liver function down (ALT/AST) Recheck lipid, hep 3 months--- 272.4

## 2014-01-27 ENCOUNTER — Encounter: Payer: Managed Care, Other (non HMO) | Attending: Endocrinology | Admitting: Dietician

## 2014-01-27 ENCOUNTER — Encounter: Payer: Self-pay | Admitting: Endocrinology

## 2014-01-27 ENCOUNTER — Encounter: Payer: Self-pay | Admitting: Dietician

## 2014-01-27 ENCOUNTER — Ambulatory Visit (INDEPENDENT_AMBULATORY_CARE_PROVIDER_SITE_OTHER): Payer: Managed Care, Other (non HMO) | Admitting: Endocrinology

## 2014-01-27 VITALS — BP 126/86 | HR 79 | Temp 98.4°F | Ht 60.0 in | Wt 167.0 lb

## 2014-01-27 DIAGNOSIS — E118 Type 2 diabetes mellitus with unspecified complications: Secondary | ICD-10-CM | POA: Diagnosis not present

## 2014-01-27 DIAGNOSIS — Z713 Dietary counseling and surveillance: Secondary | ICD-10-CM | POA: Insufficient documentation

## 2014-01-27 DIAGNOSIS — E119 Type 2 diabetes mellitus without complications: Secondary | ICD-10-CM

## 2014-01-27 MED ORDER — CANAGLIFLOZIN 300 MG PO TABS: 300.0000 mg | ORAL_TABLET | Freq: Every day | ORAL | Status: DC

## 2014-01-27 MED ORDER — SITAGLIP PHOS-METFORMIN HCL ER 50-1000 MG PO TB24: 2.0000 | ORAL_TABLET | ORAL | Status: DC

## 2014-01-27 NOTE — Progress Notes (Signed)
Subjective:    Patient ID: Nicole Richard, female    DOB: 05/02/1955, 59 y.o.   MRN: [id number]  HPI pt states DM was dx'ed in 2014; she has mild if any neuropathy of the lower extremities; she is unaware of any associated chronic complications; she has never been on insulin; pt says her diet and exercise are much better recently; she has never had GDM, pancreatitis, severe hypoglycemia or DKA. she brings a record of her cbg's which i have reviewed today.  It varies from 91-200.   Past Medical History  Diagnosis Date  . Hypertension   . Hyperlipidemia   . GERD (gastroesophageal reflux disease)   . Asthma   . Allergy     seasonal    Past Surgical History  Procedure Laterality Date  . Tubal ligation    . Appendectomy    . Abdominal hysterectomy  08/2004  . Tonsillectomy    . Breast enhancement surgery  2001  . Colonoscopy    . Polypectomy  2009    sigmoid polyps    History   Social History  . Marital Status: Divorced    Spouse Name: N/A    Number of Children: 2  . Years of Education: N/A   Occupational History  . [employer]--LPN   .    [name]     Social History Main Topics  . Smoking status: Never Smoker   . Smokeless tobacco: Never Used  . Alcohol Use: Yes     Comment: rare  . Drug Use: No  . Sexual Activity:    Partners: Male   Other Topics Concern  . Not on file   Social History Narrative   Exercise--no    Current Outpatient Prescriptions on File Prior to Visit  Medication Sig Dispense Refill  . aspirin 81 MG tablet Take 81 mg by mouth daily.    [name] atenolol (TENORMIN) 25 MG tablet Take 1 tablet by mouth daily-- 90 tablet 3  . atorvastatin (LIPITOR) 20 MG tablet Take 1 tablet (20 mg total) by mouth daily. 90 tablet 1  . calcium citrate-vitamin D (CITRACAL+D) 315-200 MG-UNIT per tablet Take 1 tablet by mouth daily.     . Cholecalciferol (VITAMIN D PO) Take 600 Units by mouth daily.      [name] esomeprazole (NEXIUM) 40 MG capsule Take 1 capsule (40 mg total)  by mouth daily. 90 capsule 3  . fenofibrate 160 MG tablet Take 1 tablet (160 mg total) by mouth daily. 30 tablet 2  . glucose blood (ONE TOUCH ULTRA TEST) test strip Use as instructed 100 each 12  . Hyoscyamine Sulfate 0.375 MG TBCR Take one tab twice a day for 5 days then as needed abdominal pain 25 tablet 1  . Ipratropium-Albuterol (COMBIVENT RESPIMAT) 20-100 MCG/ACT AERS respimat Inhale 1 puff into the lungs 4 (four) times daily. 1 Inhaler 3  . mometasone (NASONEX) 50 MCG/ACT nasal spray Place 2 sprays into the nose daily. 17 g 2  . montelukast (SINGULAIR) 10 MG tablet TAKE 1 TABLET DAILY 90 tablet 3  . Multiple Vitamin (MULTIVITAMIN WITH MINERALS) TABS tablet Take 1 tablet by mouth daily.    [name] DELICA LANCETS 76B MISC As directed 100 each 11  . Propylene Glycol (SYSTANE BALANCE) 0.6 % SOLN Apply 1 drop to eye as needed.    . Flaxseed, Linseed, (FLAXSEED OIL) 1200 MG CAPS Take 1 capsule by mouth daily.    . fluconazole (DIFLUCAN) 150 MG tablet Take 1 tablet (150  mg total) by mouth once. May repeat in 3 days prn (Patient not taking: Reported on 01/27/2014) 2 tablet 1   No current facility-administered medications on file prior to visit.    Allergies  Allergen Reactions  . Contrast Media [Iodinated Diagnostic Agents] Anaphylaxis  . Metamucil [Psyllium] Anaphylaxis  . Mustard Seed Anaphylaxis    Family History  Problem Relation Age of Onset  . Coronary artery disease    . Hyperlipidemia    . Hypertension    . Asthma    . Cervical cancer Maternal Grandmother   . Breast cancer Maternal Grandmother   . Colon cancer Maternal Grandmother   . Uterine cancer Maternal Grandmother   . Rheumatic fever Mother   . Irritable bowel syndrome Mother   . Diabetes Father   . Colon polyps Father   . Kidney disease Father   . Esophageal cancer Neg Hx   . Rectal cancer Neg Hx   . Stomach cancer Neg Hx     BP 126/86 mmHg  Pulse 79  Temp(Src) 98.4 F (36.9 C) (Oral)  Ht 5' (1.524 m)   Wt 167 lb (75.751 kg)  BMI 32.62 kg/m2  SpO2 99%   Review of Systems denies blurry vision, headache, chest pain, sob, n/v, urinary frequency, muscle cramps, excessive diaphoresis, depression, cold intolerance, rhinorrhea, and easy bruising.  She has lost weight, due to her efforts.      Objective:   Physical Exam VITAL SIGNS:  See vs page GENERAL: no distress Pulses: dorsalis pedis intact bilat.   MSK: no deformity of the feet. CV: no leg edema.  Skin:  no ulcer on the feet.  normal color and temp on the feet. Neuro: sensation is intact to touch on the feet.   Ext: There is bilateral onychomycosis of the toenails.     Lab Results  Component Value Date   HGBA1C 9.7* 12/26/2013  i personally reviewed electrocardiogram tracing: only nonspecific changes.    Assessment & Plan:  DM: severe exacerbation Obesity: new to me.  Pt wants to see a dietician, to continue her weight-loss efforts--good   Patient is advised the following: Patient Instructions  good diet and exercise habits significanly improve the control of your diabetes.  please let me know if you wish to be referred to a dietician.  high blood sugar is very risky to your health.  you should see an eye doctor and dentist every year.  It is very important to get all recommended vaccinations.  controlling your blood pressure and cholesterol drastically reduces the damage diabetes does to your body.  Those who smoke should quit.  please discuss these with your doctor.  check your blood sugar once a day.  vary the time of day when you check, between before the 3 meals, and at bedtime.  also check if you have symptoms of your blood sugar being too high or too low.  please keep a record of the readings and bring it to your next appointment here.  You can write it on any piece of paper.  please call us sooner if your blood sugar goes below 70, or if you have a lot of readings over 200.  i have sent a prescription to your pharmacy, to  add "invokana."   Please come back for a follow-up appointment in 2 months.   Also please see a dietician today.

## 2014-01-27 NOTE — Patient Instructions (Addendum)
good diet and exercise habits significanly improve the control of your diabetes.  please let me know if you wish to be referred to a dietician.  high blood sugar is very risky to your health.  you should see an eye doctor and dentist every year.  It is very important to get all recommended vaccinations.  controlling your blood pressure and cholesterol drastically reduces the damage diabetes does to your body.  Those who smoke should quit.  please discuss these with your doctor.  check your blood sugar once a day.  vary the time of day when you check, between before the 3 meals, and at bedtime.  also check if you have symptoms of your blood sugar being too high or too low.  please keep a record of the readings and bring it to your next appointment here.  You can write it on any piece of paper.  please call us sooner if your blood sugar goes below 70, or if you have a lot of readings over 200.  i have sent a prescription to your pharmacy, to add "invokana."   Please come back for a follow-up appointment in 2 months.   Also please see a dietician today.

## 2014-01-27 NOTE — Progress Notes (Signed)
  Medical Nutrition Therapy:  Appt start time: 0915 end time:  1000.   Assessment:  Primary concerns today: Diabetic diet for improved glucose control.  Patient is an LPN.[name] Here alone.  Boyfriend lives with patient but patient reports that this will end this weekend.  Patient states that she will better be able to care for herself at that time.  She does the shopping and cooking.  HgbA1C 9.9 in Sept 2015 decreased to 9.7 December 7., 2015.  CBG review per MD visit today varie from 91-200  Preferred Learning Style:   Auditory  Visual  Hands on  Learning Readiness:   Ready  Change in progress   MEDICATIONS: see list.  Patient states that Invokana has been added.   DIETARY INTAKE:  Avoided foods include aspartame, sweet and low which cause headaches    24-hr recall:  B ( AM): occasional- oatmeal, coffee with cream, or dry cereal, occasional fruit  Snk ( AM): none  L ( PM): sandwich and veges or soup or salad with protein Snk ( PM): fruit D ( PM): chicken or pasta or potato and veges, and salad or salad with protein Snk ( PM): none Beverages: water, coffee with cream, rare regular soda, unsweetened iced tea  Usual physical activity: walks about 1/2 mile 2 x per week  Estimated energy needs: 1500 calories 170 g carbohydrates 94 g protein 50 g fat  Progress Towards Goal(s):  In progress.   Nutritional Diagnosis:  NB-1.1 Food and nutrition-related knowledge deficit As related to carbohydrate balance.  As evidenced by patient report.    Intervention:  Nutrition .counseling and diabetes education initiated. Discussed Carb Counting by food group as method of portion control, reading food labels, and benefits of increased activity.  Recommendations: Consider increase exercise Continue consistent CHO Watch portion size Add protein to all meals and snacks Aim for 2-3 CHO with each meals Aim for 0-1 CHO each snack if hungry.  Teaching Method Utilized:   Visual Auditory Hands on  Handouts given during visit include:  Yellow Carb Card  Low CHO/protein snack list  Label reading  Barriers to learning/adherence to lifestyle change: work schedule  Demonstrated degree of understanding via:  Teach Back   Monitoring/Evaluation:  Dietary intake, exercise, label reading, and body weight prn.

## 2014-01-29 DIAGNOSIS — E119 Type 2 diabetes mellitus without complications: Secondary | ICD-10-CM | POA: Insufficient documentation

## 2014-01-29 HISTORY — DX: Type 2 diabetes mellitus without complications: E11.9

## 2014-02-02 ENCOUNTER — Encounter: Payer: Managed Care, Other (non HMO) | Admitting: Dietician

## 2014-02-19 ENCOUNTER — Encounter: Payer: Self-pay | Admitting: Family Medicine

## 2014-02-19 DIAGNOSIS — E1165 Type 2 diabetes mellitus with hyperglycemia: Secondary | ICD-10-CM

## 2014-02-19 DIAGNOSIS — IMO0002 Reserved for concepts with insufficient information to code with codable children: Secondary | ICD-10-CM

## 2014-02-19 DIAGNOSIS — K219 Gastro-esophageal reflux disease without esophagitis: Secondary | ICD-10-CM

## 2014-02-19 DIAGNOSIS — I10 Essential (primary) hypertension: Secondary | ICD-10-CM

## 2014-02-19 DIAGNOSIS — J45901 Unspecified asthma with (acute) exacerbation: Secondary | ICD-10-CM

## 2014-02-19 DIAGNOSIS — E785 Hyperlipidemia, unspecified: Secondary | ICD-10-CM

## 2014-03-23 MED ORDER — GLUCOSE BLOOD VI STRP: ORAL_STRIP | Status: DC

## 2014-03-23 MED ORDER — FENOFIBRATE 160 MG PO TABS
160.0000 mg | ORAL_TABLET | Freq: Every day | ORAL | Status: DC
Start: 2014-03-23 — End: 2014-07-03

## 2014-03-23 MED ORDER — ATENOLOL 25 MG PO TABS: ORAL_TABLET | ORAL | Status: DC

## 2014-03-23 MED ORDER — ATORVASTATIN CALCIUM 20 MG PO TABS: 20.0000 mg | ORAL_TABLET | Freq: Every day | ORAL | Status: DC

## 2014-03-23 MED ORDER — ESOMEPRAZOLE MAGNESIUM 40 MG PO CPDR: 40.0000 mg | DELAYED_RELEASE_CAPSULE | Freq: Every day | ORAL | Status: DC

## 2014-03-23 MED ORDER — MONTELUKAST SODIUM 10 MG PO TABS: ORAL_TABLET | ORAL | Status: DC

## 2014-03-28 ENCOUNTER — Ambulatory Visit: Payer: Managed Care, Other (non HMO) | Admitting: Endocrinology

## 2014-03-30 ENCOUNTER — Other Ambulatory Visit (INDEPENDENT_AMBULATORY_CARE_PROVIDER_SITE_OTHER): Payer: Managed Care, Other (non HMO)

## 2014-03-30 DIAGNOSIS — E785 Hyperlipidemia, unspecified: Secondary | ICD-10-CM

## 2014-03-30 LAB — ALT: ALT: 33 U/L (ref 0–35)

## 2014-03-30 LAB — LIPID PANEL
CHOLESTEROL: 169 mg/dL (ref 0–200)
HDL: 53 mg/dL (ref >39.00)
LDL Cholesterol: 92 mg/dL (ref 0–99)
NonHDL: 116
TRIGLYCERIDES: 119 mg/dL (ref 0.0–149.0)
Total CHOL/HDL Ratio: 3
VLDL: 23.8 mg/dL (ref 0.0–40.0)

## 2014-03-30 LAB — HEPATIC FUNCTION PANEL
ALBUMIN: 4.7 g/dL (ref 3.5–5.2)
ALK PHOS: 95 U/L (ref 39–117)
ALT: 33 U/L (ref 0–35)
AST: 26 U/L (ref 0–37)
Bilirubin, Direct: 0.2 mg/dL (ref 0.0–0.3)
TOTAL PROTEIN: 7.8 g/dL (ref 6.0–8.3)
Total Bilirubin: 0.9 mg/dL (ref 0.2–1.2)

## 2014-03-30 LAB — AST: AST: 26 U/L (ref 0–37)

## 2014-04-03 ENCOUNTER — Encounter: Payer: Managed Care, Other (non HMO) | Admitting: Dietician

## 2014-04-03 ENCOUNTER — Ambulatory Visit (INDEPENDENT_AMBULATORY_CARE_PROVIDER_SITE_OTHER): Payer: Managed Care, Other (non HMO) | Admitting: Endocrinology

## 2014-04-03 ENCOUNTER — Encounter: Payer: Self-pay | Admitting: Endocrinology

## 2014-04-03 VITALS — BP 126/88 | HR 68 | Temp 98.0°F | Ht 60.0 in | Wt 158.0 lb

## 2014-04-03 DIAGNOSIS — E119 Type 2 diabetes mellitus without complications: Secondary | ICD-10-CM

## 2014-04-03 LAB — BASIC METABOLIC PANEL
BUN: 19 mg/dL (ref 6–23)
CO2: 29 meq/L (ref 19–32)
CREATININE: 0.76 mg/dL (ref 0.40–1.20)
Calcium: 10 mg/dL (ref 8.4–10.5)
Chloride: 103 mEq/L (ref 96–112)
GFR: 83.46 mL/min (ref >60.00)
GLUCOSE: 94 mg/dL (ref 70–99)
Potassium: 4.3 mEq/L (ref 3.5–5.1)
Sodium: 138 mEq/L (ref 135–145)

## 2014-04-03 LAB — HEMOGLOBIN A1C: Hgb A1c MFr Bld: 5.8 % (ref 4.6–6.5)

## 2014-04-03 NOTE — Progress Notes (Signed)
Subjective:    Patient ID: Nicole Richard, female    DOB: February 22, 1955, 59 y.o.   MRN: [id number]  HPI  Pt returns for f/u of diabetes mellitus: DM type: 2 Dx'ed: 7412 Complications: none Therapy: 3 oral meds GDM: never DKA: never Severe hypoglycemia: never Pancreatitis: never Other: she has never taken insulin Interval history: no cbg record, but states cbg's are in the low-100's. pt states she feels well in general. Past Medical History  Diagnosis Date  . Hypertension   . Hyperlipidemia   . GERD (gastroesophageal reflux disease)   . Asthma   . Allergy     seasonal    Past Surgical History  Procedure Laterality Date  . Tubal ligation    . Appendectomy    . Abdominal hysterectomy  08/2004  . Tonsillectomy    . Breast enhancement surgery  2001  . Colonoscopy    . Polypectomy  2009    sigmoid polyps    History   Social History  . Marital Status: Divorced    Spouse Name: N/A  . Number of Children: 2  . Years of Education: N/A   Occupational History  . [employer]--LPN   .    [name]     Social History Main Topics  . Smoking status: Never Smoker   . Smokeless tobacco: Never Used  . Alcohol Use: Yes     Comment: rare  . Drug Use: No  . Sexual Activity:    Partners: Male   Other Topics Concern  . Not on file   Social History Narrative   Exercise--no    Current Outpatient Prescriptions on File Prior to Visit  Medication Sig Dispense Refill  . aspirin 81 MG tablet Take 81 mg by mouth daily.    [name] atenolol (TENORMIN) 25 MG tablet Take 1 tablet by mouth daily-- 90 tablet 3  . atorvastatin (LIPITOR) 20 MG tablet Take 1 tablet (20 mg total) by mouth daily. 90 tablet 0  . calcium citrate-vitamin D (CITRACAL+D) 315-200 MG-UNIT per tablet Take 1 tablet by mouth daily.     . canagliflozin (INVOKANA) 300 MG TABS tablet Take 300 mg by mouth daily. 30 tablet 11  . Cholecalciferol (VITAMIN D PO) Take 600 Units by mouth daily.      [name] esomeprazole (NEXIUM) 40 MG  capsule Take 1 capsule (40 mg total) by mouth daily. 90 capsule 3  . fenofibrate 160 MG tablet Take 1 tablet (160 mg total) by mouth daily. 90 tablet 0  . Flaxseed, Linseed, (FLAXSEED OIL) 1200 MG CAPS Take 1 capsule by mouth daily.    . fluconazole (DIFLUCAN) 150 MG tablet Take 1 tablet (150 mg total) by mouth once. May repeat in 3 days prn 2 tablet 1  . glucose blood (ONE TOUCH ULTRA TEST) test strip One touch ultra blue test strips. Check blood sugar twice daily E.119 100 each 12  . Hyoscyamine Sulfate 0.375 MG TBCR Take one tab twice a day for 5 days then as needed abdominal pain 25 tablet 1  . Ipratropium-Albuterol (COMBIVENT RESPIMAT) 20-100 MCG/ACT AERS respimat Inhale 1 puff into the lungs 4 (four) times daily. 1 Inhaler 3  . mometasone (NASONEX) 50 MCG/ACT nasal spray Place 2 sprays into the nose daily. 17 g 2  . montelukast (SINGULAIR) 10 MG tablet TAKE 1 TABLET DAILY 90 tablet 3  . Multiple Vitamin (MULTIVITAMIN WITH MINERALS) TABS tablet Take 1 tablet by mouth daily.    [name] DELICA LANCETS 87O MISC As  directed 100 each 11  . Propylene Glycol (SYSTANE BALANCE) 0.6 % SOLN Apply 1 drop to eye as needed.    . SitaGLIPtin-MetFORMIN HCl (JANUMET XR) 50-1000 MG TB24 Take 2 tablets by mouth every morning. 60 tablet 11   No current facility-administered medications on file prior to visit.    Allergies  Allergen Reactions  . Contrast Media [Iodinated Diagnostic Agents] Anaphylaxis  . Metamucil [Psyllium] Anaphylaxis  . Mustard Seed Anaphylaxis    Family History  Problem Relation Age of Onset  . Coronary artery disease    . Hyperlipidemia    . Hypertension    . Asthma    . Cervical cancer Maternal Grandmother   . Breast cancer Maternal Grandmother   . Colon cancer Maternal Grandmother   . Uterine cancer Maternal Grandmother   . Rheumatic fever Mother   . Irritable bowel syndrome Mother   . Diabetes Father   . Colon polyps Father   . Kidney disease Father   . Esophageal  cancer Neg Hx   . Rectal cancer Neg Hx   . Stomach cancer Neg Hx     BP 126/88 mmHg  Pulse 68  Temp(Src) 98 F (36.7 C) (Oral)  Ht 5' (1.524 m)  Wt 158 lb (71.668 kg)  BMI 30.86 kg/m2  SpO2 98%  Review of Systems She has lost more weight.  She denies excessive urination    Objective:   Physical Exam VITAL SIGNS:  See vs page GENERAL: no distress Pulses: dorsalis pedis intact bilat.   MSK: no deformity of the feet CV: no leg edema Skin:  no ulcer on the feet.  normal color and temp on the feet. Neuro: sensation is intact to touch on the feet.   Ext: There is bilateral onychomycosis of the toenails.    Lab Results  Component Value Date   HGBA1C 5.8 04/03/2014      Assessment & Plan:  DM: well-controlled.   Patient is advised the following: Patient Instructions  check your blood sugar once a day.  vary the time of day when you check, between before the 3 meals, and at bedtime.  also check if you have symptoms of your blood sugar being too high or too low.  please keep a record of the readings and bring it to your next appointment here.  You can write it on any piece of paper.  please call us sooner if your blood sugar goes below 70, or if you have a lot of readings over 200.  blood tests are being requested for you today.  We'll let you know about the results. If it is high, we can add "acarbose"   Please come back for a follow-up appointment in 3 months.

## 2014-04-03 NOTE — Patient Instructions (Addendum)
check your blood sugar once a day.  vary the time of day when you check, between before the 3 meals, and at bedtime.  also check if you have symptoms of your blood sugar being too high or too low.  please keep a record of the readings and bring it to your next appointment here.  You can write it on any piece of paper.  please call us sooner if your blood sugar goes below 70, or if you have a lot of readings over 200.  blood tests are being requested for you today.  We'll let you know about the results. If it is high, we can add "acarbose"   Please come back for a follow-up appointment in 3 months.

## 2014-04-19 LAB — HM DIABETES EYE EXAM

## 2014-05-30 ENCOUNTER — Encounter: Payer: Self-pay | Admitting: Family Medicine

## 2014-06-30 ENCOUNTER — Telehealth: Payer: Self-pay | Admitting: Endocrinology

## 2014-06-30 ENCOUNTER — Ambulatory Visit (INDEPENDENT_AMBULATORY_CARE_PROVIDER_SITE_OTHER): Payer: Managed Care, Other (non HMO) | Admitting: Endocrinology

## 2014-06-30 ENCOUNTER — Encounter: Payer: Self-pay | Admitting: Endocrinology

## 2014-06-30 VITALS — BP 118/68 | HR 68 | Temp 98.6°F | Resp 12 | Wt 152.0 lb

## 2014-06-30 DIAGNOSIS — E119 Type 2 diabetes mellitus without complications: Secondary | ICD-10-CM | POA: Diagnosis not present

## 2014-06-30 LAB — HEMOGLOBIN A1C: Hgb A1c MFr Bld: 6.1 % (ref 4.6–6.5)

## 2014-06-30 MED ORDER — CANAGLIFLOZIN 300 MG PO TABS: 300.0000 mg | ORAL_TABLET | Freq: Every day | ORAL | Status: DC

## 2014-06-30 MED ORDER — SAXAGLIPTIN-METFORMIN ER 2.5-1000 MG PO TB24: 2.0000 | ORAL_TABLET | ORAL | Status: DC

## 2014-06-30 MED ORDER — LINAGLIPTIN-METFORMIN HCL 2.5-1000 MG PO TABS: 1.0000 | ORAL_TABLET | Freq: Two times a day (BID) | ORAL | Status: DC

## 2014-06-30 NOTE — Patient Instructions (Addendum)
check your blood sugar once a day.  vary the time of day when you check, between before the 3 meals, and at bedtime.  also check if you have symptoms of your blood sugar being too high or too low.  please keep a record of the readings and bring it to your next appointment here.  You can write it on any piece of paper.  please call us sooner if your blood sugar goes below 70, or if you have a lot of readings over 200.  blood tests are being requested for you today.  We'll let you know about the results. If it is high, we can add "acarbose"   Please come back for a follow-up appointment in 5 months.   i have sent a prescription to your pharmacy, to change janumet to "jentadueto."  Here is a discount card.

## 2014-06-30 NOTE — Telephone Encounter (Signed)
please call patient: Ins declined jentadueto Instead, they want "kombiglyze."  i have sent a prescription to your pharmacy Discount cards are here at the office, or on the internet. i'll see you next time.

## 2014-06-30 NOTE — Progress Notes (Signed)
Subjective:    Patient ID: Nicole Richard, female    DOB: Sep 27, 1955, 59 y.o.   MRN: [id number]  HPI Pt returns for f/u of diabetes mellitus: DM type: 2 Dx'ed: 5643 Complications: none Therapy: 3 oral meds GDM: never DKA: never Severe hypoglycemia: never Pancreatitis: never Other: she has never taken insulin Interval history: no cbg record, but states cbg's are in the low-100's. pt states she feels well in general. Past Medical History  Diagnosis Date  . Hypertension   . Hyperlipidemia   . GERD (gastroesophageal reflux disease)   . Asthma   . Allergy     seasonal    Past Surgical History  Procedure Laterality Date  . Tubal ligation    . Appendectomy    . Abdominal hysterectomy  08/2004  . Tonsillectomy    . Breast enhancement surgery  2001  . Colonoscopy    . Polypectomy  2009    sigmoid polyps    History   Social History  . Marital Status: Divorced    Spouse Name: N/A  . Number of Children: 2  . Years of Education: N/A   Occupational History  . [employer]--LPN   .    [name]     Social History Main Topics  . Smoking status: Never Smoker   . Smokeless tobacco: Never Used  . Alcohol Use: Yes     Comment: rare  . Drug Use: No  . Sexual Activity:    Partners: Male   Other Topics Concern  . Not on file   Social History Narrative   Exercise--no    Current Outpatient Prescriptions on File Prior to Visit  Medication Sig Dispense Refill  . aspirin 81 MG tablet Take 81 mg by mouth daily.    [name] atenolol (TENORMIN) 25 MG tablet Take 1 tablet by mouth daily-- 90 tablet 3  . atorvastatin (LIPITOR) 20 MG tablet Take 1 tablet (20 mg total) by mouth daily. 90 tablet 0  . calcium citrate-vitamin D (CITRACAL+D) 315-200 MG-UNIT per tablet Take 1 tablet by mouth daily.     . Cholecalciferol (VITAMIN D PO) Take 600 Units by mouth daily.      [name] esomeprazole (NEXIUM) 40 MG capsule Take 1 capsule (40 mg total) by mouth daily. 90 capsule 3  . fenofibrate 160 MG  tablet Take 1 tablet (160 mg total) by mouth daily. 90 tablet 0  . Flaxseed, Linseed, (FLAXSEED OIL) 1200 MG CAPS Take 1 capsule by mouth daily.    . fluconazole (DIFLUCAN) 150 MG tablet Take 1 tablet (150 mg total) by mouth once. May repeat in 3 days prn 2 tablet 1  . glucose blood (ONE TOUCH ULTRA TEST) test strip One touch ultra blue test strips. Check blood sugar twice daily E.119 100 each 12  . Hyoscyamine Sulfate 0.375 MG TBCR Take one tab twice a day for 5 days then as needed abdominal pain 25 tablet 1  . Ipratropium-Albuterol (COMBIVENT RESPIMAT) 20-100 MCG/ACT AERS respimat Inhale 1 puff into the lungs 4 (four) times daily. 1 Inhaler 3  . mometasone (NASONEX) 50 MCG/ACT nasal spray Place 2 sprays into the nose daily. 17 g 2  . montelukast (SINGULAIR) 10 MG tablet TAKE 1 TABLET DAILY 90 tablet 3  . Multiple Vitamin (MULTIVITAMIN WITH MINERALS) TABS tablet Take 1 tablet by mouth daily.    [name] DELICA LANCETS 32R MISC As directed 100 each 11  . Propylene Glycol (SYSTANE BALANCE) 0.6 % SOLN Apply 1 drop to eye  as needed.     No current facility-administered medications on file prior to visit.    Allergies  Allergen Reactions  . Contrast Media [Iodinated Diagnostic Agents] Anaphylaxis  . Metamucil [Psyllium] Anaphylaxis  . Mustard Seed Anaphylaxis    Family History  Problem Relation Age of Onset  . Coronary artery disease    . Hyperlipidemia    . Hypertension    . Asthma    . Cervical cancer Maternal Grandmother   . Breast cancer Maternal Grandmother   . Colon cancer Maternal Grandmother   . Uterine cancer Maternal Grandmother   . Rheumatic fever Mother   . Irritable bowel syndrome Mother   . Diabetes Father   . Colon polyps Father   . Kidney disease Father   . Esophageal cancer Neg Hx   . Rectal cancer Neg Hx   . Stomach cancer Neg Hx     BP 118/68 mmHg  Pulse 68  Temp(Src) 98.6 F (37 C) (Oral)  Resp 12  Wt 152 lb (68.947 kg)  SpO2 95%    Review of  Systems No weight change    Objective:   Physical Exam VITAL SIGNS:  See vs page GENERAL: no distress Pulses: dorsalis pedis intact bilat.   MSK: no deformity of the feet CV: no leg edema Skin:  no ulcer on the feet.  normal color and temp on the feet. Neuro: sensation is intact to touch on the feet Ext: There is bilateral onychomycosis of the toenails.   Lab Results  Component Value Date   HGBA1C 6.1 06/30/2014       Assessment & Plan:  DM: well-controlled  Patient is advised the following: Patient Instructions  check your blood sugar once a day.  vary the time of day when you check, between before the 3 meals, and at bedtime.  also check if you have symptoms of your blood sugar being too high or too low.  please keep a record of the readings and bring it to your next appointment here.  You can write it on any piece of paper.  please call us sooner if your blood sugar goes below 70, or if you have a lot of readings over 200.  blood tests are being requested for you today.  We'll let you know about the results. If it is high, we can add "acarbose"   Please come back for a follow-up appointment in 5 months.   i have sent a prescription to your pharmacy, to change janumet to "jentadueto."  Here is a discount card.  addendum: Please continue the same medications

## 2014-07-02 ENCOUNTER — Other Ambulatory Visit: Payer: Self-pay | Admitting: Family Medicine

## 2014-07-02 DIAGNOSIS — E785 Hyperlipidemia, unspecified: Secondary | ICD-10-CM

## 2014-07-03 ENCOUNTER — Other Ambulatory Visit: Payer: Self-pay | Admitting: Family Medicine

## 2014-07-03 MED ORDER — ATORVASTATIN CALCIUM 20 MG PO TABS: 20.0000 mg | ORAL_TABLET | Freq: Every day | ORAL | Status: DC

## 2014-07-03 MED ORDER — FENOFIBRATE 160 MG PO TABS: 160.0000 mg | ORAL_TABLET | Freq: Every day | ORAL | Status: DC

## 2014-07-03 NOTE — Telephone Encounter (Signed)
Pt advised of note below and voiced understanding.  

## 2014-07-04 ENCOUNTER — Telehealth: Payer: Self-pay

## 2014-07-04 NOTE — Telephone Encounter (Signed)
Dr. Loanne Drilling has sent in a prescription for Jentadueto 2.5 mg 1000 mg. This medication is not covered, alternatives are Januvia, Metformin and Onglyza. Please advise which one should be sent in during Dr. Cordelia Pen abscence. Thanks!

## 2014-07-04 NOTE — Telephone Encounter (Signed)
I see the prescription for Kombiglyze listed, that's what she can take

## 2014-07-04 NOTE — Telephone Encounter (Signed)
Noted  

## 2014-07-05 ENCOUNTER — Telehealth: Payer: Self-pay | Admitting: Endocrinology

## 2014-07-05 MED ORDER — SAXAGLIPTIN-METFORMIN ER 2.5-1000 MG PO TB24: 2.0000 | ORAL_TABLET | ORAL | Status: DC

## 2014-07-05 NOTE — Telephone Encounter (Signed)
[facility] note dated 07/04/14 7:15 PM  Caller States she was supposed to have a script for a new med sent in yesterday but the pharmacy does not have anything for her ---Caller States she was supposed to have a script for [employer] sent in yesterday but the pharmacy does not have anything for her. States the MD had sent in a Rx on Friday that was not covered by her insurance and then was to change the Rx. States the pharm has not recieved the changed Rx. Did not call yesterday or today during reg bus hrs for the needed Rx.  Advised to call the [facility]O in the am during reg bus hrs for further asst in Rx needed. Verb understanding

## 2014-07-05 NOTE — Telephone Encounter (Signed)
First Rx for Kombiglyze was sent to [facility]. Rx resubmitted to her local [place].

## 2014-08-02 ENCOUNTER — Other Ambulatory Visit: Payer: Self-pay

## 2014-08-02 DIAGNOSIS — Z1231 Encounter for screening mammogram for malignant neoplasm of breast: Secondary | ICD-10-CM

## 2014-09-11 ENCOUNTER — Telehealth: Payer: Self-pay | Admitting: Family Medicine

## 2014-09-11 NOTE — Telephone Encounter (Signed)
Pre visit letter mailed 09/11/14

## 2014-09-12 ENCOUNTER — Ambulatory Visit
Admission: RE | Admit: 2014-09-12 | Discharge: 2014-09-12 | Disposition: A | Discharge status: Home / Self Care | Payer: Managed Care, Other (non HMO) | Source: Ambulatory Visit

## 2014-09-12 DIAGNOSIS — Z1231 Encounter for screening mammogram for malignant neoplasm of breast: Secondary | ICD-10-CM

## 2014-09-29 ENCOUNTER — Telehealth: Payer: Self-pay | Admitting: Behavioral Health

## 2014-09-29 NOTE — Telephone Encounter (Signed)
Unable to reach patient at time of Pre-Visit Call.  Left message for patient to return call when available.    

## 2014-10-02 ENCOUNTER — Ambulatory Visit (INDEPENDENT_AMBULATORY_CARE_PROVIDER_SITE_OTHER): Payer: Managed Care, Other (non HMO) | Admitting: Family Medicine

## 2014-10-02 ENCOUNTER — Encounter: Payer: Self-pay | Admitting: Family Medicine

## 2014-10-02 VITALS — BP 112/75 | HR 72 | Temp 98.5°F | Ht 60.0 in | Wt 152.8 lb

## 2014-10-02 DIAGNOSIS — J45901 Unspecified asthma with (acute) exacerbation: Secondary | ICD-10-CM

## 2014-10-02 DIAGNOSIS — Z0000 Encounter for general adult medical examination without abnormal findings: Secondary | ICD-10-CM | POA: Diagnosis not present

## 2014-10-02 DIAGNOSIS — E785 Hyperlipidemia, unspecified: Secondary | ICD-10-CM

## 2014-10-02 DIAGNOSIS — I10 Essential (primary) hypertension: Secondary | ICD-10-CM

## 2014-10-02 DIAGNOSIS — Z23 Encounter for immunization: Secondary | ICD-10-CM

## 2014-10-02 DIAGNOSIS — K219 Gastro-esophageal reflux disease without esophagitis: Secondary | ICD-10-CM

## 2014-10-02 LAB — LIPID PANEL
CHOL/HDL RATIO: 3
Cholesterol: 158 mg/dL (ref 0–200)
HDL: 55 mg/dL (ref >39.00)
LDL Cholesterol: 88 mg/dL (ref 0–99)
NONHDL: 102.58
Triglycerides: 72 mg/dL (ref 0.0–149.0)
VLDL: 14.4 mg/dL (ref 0.0–40.0)

## 2014-10-02 LAB — POCT URINALYSIS DIPSTICK
Bilirubin, UA: NEGATIVE
Ketones, UA: NEGATIVE
Leukocytes, UA: NEGATIVE
NITRITE UA: NEGATIVE
PROTEIN UA: NEGATIVE
RBC UA: NEGATIVE
Spec Grav, UA: 1.015
UROBILINOGEN UA: NEGATIVE
pH, UA: 6

## 2014-10-02 LAB — CBC WITH DIFFERENTIAL/PLATELET
BASOS PCT: 0.5 % (ref 0.0–3.0)
Basophils Absolute: 0 10*3/uL (ref 0.0–0.1)
EOS PCT: 8.5 % — AB (ref 0.0–5.0)
Eosinophils Absolute: 0.6 10*3/uL (ref 0.0–0.7)
HEMATOCRIT: 43.8 % (ref 36.0–46.0)
Hemoglobin: 14.5 g/dL (ref 12.0–15.0)
Lymphocytes Relative: 33.8 % (ref 12.0–46.0)
Lymphs Abs: 2.3 10*3/uL (ref 0.7–4.0)
MCHC: 33 g/dL (ref 30.0–36.0)
MCV: 89.5 fl (ref 78.0–100.0)
MONOS PCT: 7.3 % (ref 3.0–12.0)
Monocytes Absolute: 0.5 10*3/uL (ref 0.1–1.0)
NEUTROS ABS: 3.4 10*3/uL (ref 1.4–7.7)
NEUTROS PCT: 49.9 % (ref 43.0–77.0)
PLATELETS: 330 10*3/uL (ref 150.0–400.0)
RBC: 4.9 Mil/uL (ref 3.87–5.11)
RDW: 13.5 % (ref 11.5–15.5)
WBC: 6.9 10*3/uL (ref 4.0–10.5)

## 2014-10-02 LAB — COMPREHENSIVE METABOLIC PANEL
ALT: 21 U/L (ref 0–35)
AST: 18 U/L (ref 0–37)
Albumin: 4.3 g/dL (ref 3.5–5.2)
Alkaline Phosphatase: 63 U/L (ref 39–117)
BUN: 17 mg/dL (ref 6–23)
CALCIUM: 10 mg/dL (ref 8.4–10.5)
CHLORIDE: 109 meq/L (ref 96–112)
CO2: 28 meq/L (ref 19–32)
Creatinine, Ser: 0.8 mg/dL (ref 0.40–1.20)
GFR: 78.52 mL/min (ref >60.00)
GLUCOSE: 103 mg/dL — AB (ref 70–99)
POTASSIUM: 4.7 meq/L (ref 3.5–5.1)
Sodium: 144 mEq/L (ref 135–145)
Total Bilirubin: 0.4 mg/dL (ref 0.2–1.2)
Total Protein: 7.3 g/dL (ref 6.0–8.3)

## 2014-10-02 LAB — TSH: TSH: 1.17 u[IU]/mL (ref 0.35–4.50)

## 2014-10-02 LAB — HEPATITIS C ANTIBODY: HCV AB: NEGATIVE

## 2014-10-02 LAB — MICROALBUMIN / CREATININE URINE RATIO
CREATININE, U: 94.8 mg/dL
Microalb Creat Ratio: 0.7 mg/g (ref 0.0–30.0)
Microalb, Ur: <0.7 mg/dL (ref 0.0–1.9)

## 2014-10-02 LAB — HIV ANTIBODY (ROUTINE TESTING W REFLEX): HIV: NONREACTIVE

## 2014-10-02 NOTE — Progress Notes (Signed)
Subjective:     Nicole Richard is a 59 y.o. female and is here for a comprehensive physical exam. The patient reports no problems.  Social History   Social History  . Marital Status: Divorced    Spouse Name: N/A  . Number of Children: 2  . Years of Education: N/A   Occupational History  . [employer]--LPN   .    [name]     Social History Main Topics  . Smoking status: Never Smoker   . Smokeless tobacco: Never Used  . Alcohol Use: Yes     Comment: rare  . Drug Use: No  . Sexual Activity:    Partners: Male   Other Topics Concern  . Not on file   Social History Narrative   Exercise--no   Health Maintenance  Topic Date Due  . Hepatitis C Screening  [birth year]  . HIV Screening  07/15/1972  . PNEUMOCOCCAL POLYSACCHARIDE VACCINE (2) 11/03/2011  . INFLUENZA VACCINE  10/02/2015 (Originally 08/21/2014)  . URINE MICROALBUMIN  12/27/2014  . HEMOGLOBIN A1C  12/30/2014  . OPHTHALMOLOGY EXAM  04/19/2015  . FOOT EXAM  06/30/2015  . MAMMOGRAM  09/11/2016  . PAP SMEAR  09/29/2016  . COLONOSCOPY  08/16/2017  . TETANUS/TDAP  10/19/2022    The following portions of the patient's history were reviewed and updated as appropriate:  She  has a past medical history of Hypertension; Hyperlipidemia; GERD (gastroesophageal reflux disease); Asthma; and Allergy. She  does not have any pertinent problems on file. She  has past surgical history that includes Tubal ligation; Appendectomy; Abdominal hysterectomy (08/2004); Tonsillectomy; Breast enhancement surgery (2001); Colonoscopy; and Polypectomy (2009). Her family history includes Asthma in an other family member; Breast cancer in her maternal grandmother; Cervical cancer in her maternal grandmother; Colon cancer in her maternal grandmother; Colon polyps in her father; Coronary artery disease in an other family member; Diabetes in her father; Hyperlipidemia in an other family member; Hypertension in an other family member; Irritable bowel  syndrome in her mother; Kidney disease in her father; Rheumatic fever in her mother; Uterine cancer in her maternal grandmother. There is no history of Esophageal cancer, Rectal cancer, or Stomach cancer. She  reports that she has never smoked. She has never used smokeless tobacco. She reports that she drinks alcohol. She reports that she does not use illicit drugs. She has a current medication list which includes the following prescription(s): aspirin, atenolol, atorvastatin, calcium citrate-vitamin d, canagliflozin, cholecalciferol, esomeprazole, fenofibrate, flaxseed oil, fluconazole, glucose blood, hyoscyamine sulfate, ipratropium-albuterol, mometasone, montelukast, multivitamin with minerals, onetouch delica lancets 03P, propylene glycol, and saxagliptin-metformin. Current Outpatient Prescriptions on File Prior to Visit  Medication Sig Dispense Refill  . aspirin 81 MG tablet Take 81 mg by mouth daily.    [name] atenolol (TENORMIN) 25 MG tablet Take 1 tablet by mouth daily-- 90 tablet 3  . atorvastatin (LIPITOR) 20 MG tablet Take 1 tablet (20 mg total) by mouth daily. 90 tablet 1  . canagliflozin (INVOKANA) 300 MG TABS tablet Take 300 mg by mouth daily. 90 tablet 3  . Cholecalciferol (VITAMIN D PO) Take 600 Units by mouth daily.      [name] esomeprazole (NEXIUM) 40 MG capsule Take 1 capsule (40 mg total) by mouth daily. 90 capsule 3  . fenofibrate 160 MG tablet Take 1 tablet (160 mg total) by mouth daily. 90 tablet 1  . Flaxseed, Linseed, (FLAXSEED OIL) 1200 MG CAPS Take 1 capsule by mouth daily.    . fluconazole (DIFLUCAN)  150 MG tablet Take 1 tablet (150 mg total) by mouth once. May repeat in 3 days prn 2 tablet 1  . glucose blood (ONE TOUCH ULTRA TEST) test strip One touch ultra blue test strips. Check blood sugar twice daily E.119 100 each 12  . Hyoscyamine Sulfate 0.375 MG TBCR Take one tab twice a day for 5 days then as needed abdominal pain 25 tablet 1  . Ipratropium-Albuterol (COMBIVENT RESPIMAT)  20-100 MCG/ACT AERS respimat Inhale 1 puff into the lungs 4 (four) times daily. 1 Inhaler 3  . mometasone (NASONEX) 50 MCG/ACT nasal spray Place 2 sprays into the nose daily. 17 g 2  . montelukast (SINGULAIR) 10 MG tablet TAKE 1 TABLET DAILY 90 tablet 3  . Multiple Vitamin (MULTIVITAMIN WITH MINERALS) TABS tablet Take 1 tablet by mouth daily.    [name] DELICA LANCETS 37T MISC As directed 100 each 11  . Propylene Glycol (SYSTANE BALANCE) 0.6 % SOLN Apply 1 drop to eye as needed.    . Saxagliptin-Metformin (KOMBIGLYZE XR) 2.05-998 MG TB24 Take 2 tablets by mouth every morning. 60 tablet 11   No current facility-administered medications on file prior to visit.   She is allergic to contrast media; metamucil; and mustard seed..  Review of Systems Review of Systems  Constitutional: Negative for activity change, appetite change and fatigue.  HENT: Negative for hearing loss, congestion, tinnitus and ear discharge.  dentist q86mEyes: Negative for visual disturbance (see optho q1y -- vision corrected to 20/20 with glasses).  Respiratory: Negative for cough, chest tightness and shortness of breath.   Cardiovascular: Negative for chest pain, palpitations and leg swelling.  Gastrointestinal: Negative for abdominal pain, diarrhea, constipation and abdominal distention.  Genitourinary: Negative for urgency, frequency, decreased urine volume and difficulty urinating.  Musculoskeletal: Negative for back pain, arthralgias and gait problem.  Skin: Negative for color change, pallor and rash.  Neurological: Negative for dizziness, light-headedness, numbness and headaches.  Hematological: Negative for adenopathy. Does not bruise/bleed easily.  Psychiatric/Behavioral: Negative for suicidal ideas, confusion, sleep disturbance, self-injury, dysphoric mood, decreased concentration and agitation.       Objective:     BP 112/75 mmHg  Pulse 72  Temp(Src) 98.5 F (36.9 C) (Oral)  Ht 5' (1.524 m)  Wt 152  lb 12.8 oz (69.31 kg)  BMI 29.84 kg/m2  SpO2 98% General appearance: alert, cooperative, appears stated age and no distress Head: Normocephalic, without obvious abnormality, atraumatic Eyes: conjunctivae/corneas clear. PERRL, EOM's intact. Fundi benign. Ears: normal TM's and external ear canals both ears Nose: Nares normal. Septum midline. Mucosa normal. No drainage or sinus tenderness. Throat: lips, mucosa, and tongue normal; teeth and gums normal Neck: no adenopathy, no carotid bruit, no JVD, supple, symmetrical, trachea midline and thyroid not enlarged, symmetric, no tenderness/mass/nodules Back: symmetric, no curvature. ROM normal. No CVA tenderness. Lungs: clear to auscultation bilaterally Breasts: normal appearance, no masses or tenderness---breast implants Heart: regular rate and rhythm, S1, S2 normal, no murmur, click, rub or gallop Abdomen: soft, non-tender; bowel sounds normal; no masses,  no organomegaly Pelvic: deferred Extremities: extremities normal, atraumatic, no cyanosis or edema Pulses: 2+ and symmetric Skin: Skin color, texture, turgor normal. No rashes or lesions Lymph nodes: Cervical, supraclavicular, and axillary nodes normal. Neurologic: Alert and oriented X 3, normal strength and tone. Normal symmetric reflexes. Normal coordination and gait Psych- no depression, no anxiety   Assessment:    Healthy female exam.      Plan:    ghm utd Check labs See  After Visit Summary for Counseling Recommendations    1. Asthma with acute exacerbation, unspecified asthma severity Cont' inhalers stable  2. Gastroesophageal reflux disease without esophagitis Stable con't nexium  3. Essential hypertension Stable con't atenolol - Comp Met (CMET) - CBC with Differential/Platelet - Lipid panel - Microalbumin / creatinine urine ratio - POCT urinalysis dipstick - TSH  4. Preventative health care See avs ghm utd - Comp Met (CMET) - CBC with Differential/Platelet -  Lipid panel - Microalbumin / creatinine urine ratio - POCT urinalysis dipstick - TSH - HIV antibody - Hepatitis C antibody  5. Hyperlipidemia con't fenofibrate and lipitor - Comp Met (CMET) - CBC with Differential/Platelet - Lipid panel - Microalbumin / creatinine urine ratio - POCT urinalysis dipstick - TSH

## 2014-10-02 NOTE — Progress Notes (Signed)
Pre visit review using our clinic review tool, if applicable. No additional management support is needed unless otherwise documented below in the visit note. 

## 2014-10-02 NOTE — Patient Instructions (Signed)
Preventive Care for Adults A healthy lifestyle and preventive care can promote health and wellness. Preventive health guidelines for women include the following key practices.  A routine yearly physical is a good way to check with your health care provider about your health and preventive screening. It is a chance to share any concerns and updates on your health and to receive a thorough exam.  Visit your dentist for a routine exam and preventive care every 6 months. Brush your teeth twice a day and floss once a day. Good oral hygiene prevents tooth decay and gum disease.  The frequency of eye exams is based on your age, health, family medical history, use of contact lenses, and other factors. Follow your health care provider's recommendations for frequency of eye exams.  Eat a healthy diet. Foods like vegetables, fruits, whole grains, low-fat dairy products, and lean protein foods contain the nutrients you need without too many calories. Decrease your intake of foods high in solid fats, added sugars, and salt. Eat the right amount of calories for you.Get information about a proper diet from your health care provider, if necessary.  Regular physical exercise is one of the most important things you can do for your health. Most adults should get at least 150 minutes of moderate-intensity exercise (any activity that increases your heart rate and causes you to sweat) each week. In addition, most adults need muscle-strengthening exercises on 2 or more days a week.  Maintain a healthy weight. The body mass index (BMI) is a screening tool to identify possible weight problems. It provides an estimate of body fat based on height and weight. Your health care provider can find your BMI and can help you achieve or maintain a healthy weight.For adults 20 years and older:  A BMI below 18.5 is considered underweight.  A BMI of 18.5 to 24.9 is normal.  A BMI of 25 to 29.9 is considered overweight.  A BMI of  30 and above is considered obese.  Maintain normal blood lipids and cholesterol levels by exercising and minimizing your intake of saturated fat. Eat a balanced diet with plenty of fruit and vegetables. Blood tests for lipids and cholesterol should begin at age 76 and be repeated every 5 years. If your lipid or cholesterol levels are high, you are over 50, or you are at high risk for heart disease, you may need your cholesterol levels checked more frequently.Ongoing high lipid and cholesterol levels should be treated with medicines if diet and exercise are not working.  If you smoke, find out from your health care provider how to quit. If you do not use tobacco, do not start.  Lung cancer screening is recommended for adults aged 22-80 years who are at high risk for developing lung cancer because of a history of smoking. A yearly low-dose CT scan of the lungs is recommended for people who have at least a 30-pack-year history of smoking and are a current smoker or have quit within the past 15 years. A pack year of smoking is smoking an average of 1 pack of cigarettes a day for 1 year (for example: 1 pack a day for 30 years or 2 packs a day for 15 years). Yearly screening should continue until the smoker has stopped smoking for at least 15 years. Yearly screening should be stopped for people who develop a health problem that would prevent them from having lung cancer treatment.  If you are pregnant, do not drink alcohol. If you are breastfeeding,  be very cautious about drinking alcohol. If you are not pregnant and choose to drink alcohol, do not have more than 1 drink per day. One drink is considered to be 12 ounces (355 mL) of beer, 5 ounces (148 mL) of wine, or 1.5 ounces (44 mL) of liquor.  Avoid use of street drugs. Do not share needles with anyone. Ask for help if you need support or instructions about stopping the use of drugs.  High blood pressure causes heart disease and increases the risk of  stroke. Your blood pressure should be checked at least every 1 to 2 years. Ongoing high blood pressure should be treated with medicines if weight loss and exercise do not work.  If you are 75-52 years old, ask your health care provider if you should take aspirin to prevent strokes.  Diabetes screening involves taking a blood sample to check your fasting blood sugar level. This should be done once every 3 years, after age 15, if you are within normal weight and without risk factors for diabetes. Testing should be considered at a younger age or be carried out more frequently if you are overweight and have at least 1 risk factor for diabetes.  Breast cancer screening is essential preventive care for women. You should practice "breast self-awareness." This means understanding the normal appearance and feel of your breasts and may include breast self-examination. Any changes detected, no matter how small, should be reported to a health care provider. Women in their 58s and 30s should have a clinical breast exam (CBE) by a health care provider as part of a regular health exam every 1 to 3 years. After age 16, women should have a CBE every year. Starting at age 53, women should consider having a mammogram (breast X-ray test) every year. Women who have a family history of breast cancer should talk to their health care provider about genetic screening. Women at a high risk of breast cancer should talk to their health care providers about having an MRI and a mammogram every year.  Breast cancer gene (BRCA)-related cancer risk assessment is recommended for women who have family members with BRCA-related cancers. BRCA-related cancers include breast, ovarian, tubal, and peritoneal cancers. Having family members with these cancers may be associated with an increased risk for harmful changes (mutations) in the breast cancer genes BRCA1 and BRCA2. Results of the assessment will determine the need for genetic counseling and  BRCA1 and BRCA2 testing.  Routine pelvic exams to screen for cancer are no longer recommended for nonpregnant women who are considered low risk for cancer of the pelvic organs (ovaries, uterus, and vagina) and who do not have symptoms. Ask your health care provider if a screening pelvic exam is right for you.  If you have had past treatment for cervical cancer or a condition that could lead to cancer, you need Pap tests and screening for cancer for at least 20 years after your treatment. If Pap tests have been discontinued, your risk factors (such as having a new sexual partner) need to be reassessed to determine if screening should be resumed. Some women have medical problems that increase the chance of getting cervical cancer. In these cases, your health care provider may recommend more frequent screening and Pap tests.  The HPV test is an additional test that may be used for cervical cancer screening. The HPV test looks for the virus that can cause the cell changes on the cervix. The cells collected during the Pap test can be  tested for HPV. The HPV test could be used to screen women aged 30 years and older, and should be used in women of any age who have unclear Pap test results. After the age of 30, women should have HPV testing at the same frequency as a Pap test.  Colorectal cancer can be detected and often prevented. Most routine colorectal cancer screening begins at the age of 50 years and continues through age 75 years. However, your health care provider may recommend screening at an earlier age if you have risk factors for colon cancer. On a yearly basis, your health care provider may provide home test kits to check for hidden blood in the stool. Use of a small camera at the end of a tube, to directly examine the colon (sigmoidoscopy or colonoscopy), can detect the earliest forms of colorectal cancer. Talk to your health care provider about this at age 50, when routine screening begins. Direct  exam of the colon should be repeated every 5-10 years through age 75 years, unless early forms of pre-cancerous polyps or small growths are found.  People who are at an increased risk for hepatitis B should be screened for this virus. You are considered at high risk for hepatitis B if:  You were born in a country where hepatitis B occurs often. Talk with your health care provider about which countries are considered high risk.  Your parents were born in a high-risk country and you have not received a shot to protect against hepatitis B (hepatitis B vaccine).  You have HIV or AIDS.  You use needles to inject street drugs.  You live with, or have sex with, someone who has hepatitis B.  You get hemodialysis treatment.  You take certain medicines for conditions like cancer, organ transplantation, and autoimmune conditions.  Hepatitis C blood testing is recommended for all people born from 1945 through 1965 and any individual with known risks for hepatitis C.  Practice safe sex. Use condoms and avoid high-risk sexual practices to reduce the spread of sexually transmitted infections (STIs). STIs include gonorrhea, chlamydia, syphilis, trichomonas, herpes, HPV, and human immunodeficiency virus (HIV). Herpes, HIV, and HPV are viral illnesses that have no cure. They can result in disability, cancer, and death.  You should be screened for sexually transmitted illnesses (STIs) including gonorrhea and chlamydia if:  You are sexually active and are younger than 24 years.  You are older than 24 years and your health care provider tells you that you are at risk for this type of infection.  Your sexual activity has changed since you were last screened and you are at an increased risk for chlamydia or gonorrhea. Ask your health care provider if you are at risk.  If you are at risk of being infected with HIV, it is recommended that you take a prescription medicine daily to prevent HIV infection. This is  called preexposure prophylaxis (PrEP). You are considered at risk if:  You are a heterosexual woman, are sexually active, and are at increased risk for HIV infection.  You take drugs by injection.  You are sexually active with a partner who has HIV.  Talk with your health care provider about whether you are at high risk of being infected with HIV. If you choose to begin PrEP, you should first be tested for HIV. You should then be tested every 3 months for as long as you are taking PrEP.  Osteoporosis is a disease in which the bones lose minerals and strength   with aging. This can result in serious bone fractures or breaks. The risk of osteoporosis can be identified using a bone density scan. Women ages 65 years and over and women at risk for fractures or osteoporosis should discuss screening with their health care providers. Ask your health care provider whether you should take a calcium supplement or vitamin D to reduce the rate of osteoporosis.  Menopause can be associated with physical symptoms and risks. Hormone replacement therapy is available to decrease symptoms and risks. You should talk to your health care provider about whether hormone replacement therapy is right for you.  Use sunscreen. Apply sunscreen liberally and repeatedly throughout the day. You should seek shade when your shadow is shorter than you. Protect yourself by wearing long sleeves, pants, a wide-brimmed hat, and sunglasses year round, whenever you are outdoors.  Once a month, do a whole body skin exam, using a mirror to look at the skin on your back. Tell your health care provider of new moles, moles that have irregular borders, moles that are larger than a pencil eraser, or moles that have changed in shape or color.  Stay current with required vaccines (immunizations).  Influenza vaccine. All adults should be immunized every year.  Tetanus, diphtheria, and acellular pertussis (Td, Tdap) vaccine. Pregnant women should  receive 1 dose of Tdap vaccine during each pregnancy. The dose should be obtained regardless of the length of time since the last dose. Immunization is preferred during the 27th-36th week of gestation. An adult who has not previously received Tdap or who does not know her vaccine status should receive 1 dose of Tdap. This initial dose should be followed by tetanus and diphtheria toxoids (Td) booster doses every 10 years. Adults with an unknown or incomplete history of completing a 3-dose immunization series with Td-containing vaccines should begin or complete a primary immunization series including a Tdap dose. Adults should receive a Td booster every 10 years.  Varicella vaccine. An adult without evidence of immunity to varicella should receive 2 doses or a second dose if she has previously received 1 dose. Pregnant females who do not have evidence of immunity should receive the first dose after pregnancy. This first dose should be obtained before leaving the health care facility. The second dose should be obtained 4-8 weeks after the first dose.  Human papillomavirus (HPV) vaccine. Females aged 13-26 years who have not received the vaccine previously should obtain the 3-dose series. The vaccine is not recommended for use in pregnant females. However, pregnancy testing is not needed before receiving a dose. If a female is found to be pregnant after receiving a dose, no treatment is needed. In that case, the remaining doses should be delayed until after the pregnancy. Immunization is recommended for any person with an immunocompromised condition through the age of 26 years if she did not get any or all doses earlier. During the 3-dose series, the second dose should be obtained 4-8 weeks after the first dose. The third dose should be obtained 24 weeks after the first dose and 16 weeks after the second dose.  Zoster vaccine. One dose is recommended for adults aged 60 years or older unless certain conditions are  present.  Measles, mumps, and rubella (MMR) vaccine. Adults born before 1957 generally are considered immune to measles and mumps. Adults born in 1957 or later should have 1 or more doses of MMR vaccine unless there is a contraindication to the vaccine or there is laboratory evidence of immunity to   each of the three diseases. A routine second dose of MMR vaccine should be obtained at least 28 days after the first dose for students attending postsecondary schools, health care workers, or international travelers. People who received inactivated measles vaccine or an unknown type of measles vaccine during 1963-1967 should receive 2 doses of MMR vaccine. People who received inactivated mumps vaccine or an unknown type of mumps vaccine before 1979 and are at high risk for mumps infection should consider immunization with 2 doses of MMR vaccine. For females of childbearing age, rubella immunity should be determined. If there is no evidence of immunity, females who are not pregnant should be vaccinated. If there is no evidence of immunity, females who are pregnant should delay immunization until after pregnancy. Unvaccinated health care workers born before 1957 who lack laboratory evidence of measles, mumps, or rubella immunity or laboratory confirmation of disease should consider measles and mumps immunization with 2 doses of MMR vaccine or rubella immunization with 1 dose of MMR vaccine.  Pneumococcal 13-valent conjugate (PCV13) vaccine. When indicated, a person who is uncertain of her immunization history and has no record of immunization should receive the PCV13 vaccine. An adult aged 19 years or older who has certain medical conditions and has not been previously immunized should receive 1 dose of PCV13 vaccine. This PCV13 should be followed with a dose of pneumococcal polysaccharide (PPSV23) vaccine. The PPSV23 vaccine dose should be obtained at least 8 weeks after the dose of PCV13 vaccine. An adult aged 19  years or older who has certain medical conditions and previously received 1 or more doses of PPSV23 vaccine should receive 1 dose of PCV13. The PCV13 vaccine dose should be obtained 1 or more years after the last PPSV23 vaccine dose.  Pneumococcal polysaccharide (PPSV23) vaccine. When PCV13 is also indicated, PCV13 should be obtained first. All adults aged 65 years and older should be immunized. An adult younger than age 65 years who has certain medical conditions should be immunized. Any person who resides in a nursing home or long-term care facility should be immunized. An adult smoker should be immunized. People with an immunocompromised condition and certain other conditions should receive both PCV13 and PPSV23 vaccines. People with human immunodeficiency virus (HIV) infection should be immunized as soon as possible after diagnosis. Immunization during chemotherapy or radiation therapy should be avoided. Routine use of PPSV23 vaccine is not recommended for American Indians, [place] Natives, or people younger than 65 years unless there are medical conditions that require PPSV23 vaccine. When indicated, people who have unknown immunization and have no record of immunization should receive PPSV23 vaccine. One-time revaccination 5 years after the first dose of PPSV23 is recommended for people aged 19-64 years who have chronic kidney failure, nephrotic syndrome, asplenia, or immunocompromised conditions. People who received 1-2 doses of PPSV23 before age 65 years should receive another dose of PPSV23 vaccine at age 65 years or later if at least 5 years have passed since the previous dose. Doses of PPSV23 are not needed for people immunized with PPSV23 at or after age 65 years.  Meningococcal vaccine. Adults with asplenia or persistent complement component deficiencies should receive 2 doses of quadrivalent meningococcal conjugate (MenACWY-D) vaccine. The doses should be obtained at least 2 months apart.  Microbiologists working with certain meningococcal bacteria, military recruits, people at risk during an outbreak, and people who travel to or live in countries with a high rate of meningitis should be immunized. A first-year college student up through age   21 years who is living in a residence hall should receive a dose if she did not receive a dose on or after her 16th birthday. Adults who have certain high-risk conditions should receive one or more doses of vaccine.  Hepatitis A vaccine. Adults who wish to be protected from this disease, have certain high-risk conditions, work with hepatitis A-infected animals, work in hepatitis A research labs, or travel to or work in countries with a high rate of hepatitis A should be immunized. Adults who were previously unvaccinated and who anticipate close contact with an international adoptee during the first 60 days after arrival in the [place] from a country with a high rate of hepatitis A should be immunized.  Hepatitis B vaccine. Adults who wish to be protected from this disease, have certain high-risk conditions, may be exposed to blood or other infectious body fluids, are household contacts or sex partners of hepatitis B positive people, are clients or workers in certain care facilities, or travel to or work in countries with a high rate of hepatitis B should be immunized.  Haemophilus influenzae type b (Hib) vaccine. A previously unvaccinated person with asplenia or sickle cell disease or having a scheduled splenectomy should receive 1 dose of Hib vaccine. Regardless of previous immunization, a recipient of a hematopoietic stem cell transplant should receive a 3-dose series 6-12 months after her successful transplant. Hib vaccine is not recommended for adults with HIV infection. Preventive Services / Frequency Ages 64 to 68 years  Blood pressure check.** / Every 1 to 2 years.  Lipid and cholesterol check.** / Every 5 years beginning at age  22.  Clinical breast exam.** / Every 3 years for women in their 88s and 53s.  BRCA-related cancer risk assessment.** / For women who have family members with a BRCA-related cancer (breast, ovarian, tubal, or peritoneal cancers).  Pap test.** / Every 2 years from ages 90 through 51. Every 3 years starting at age 21 through age 56 or 3 with a history of 3 consecutive normal Pap tests.  HPV screening.** / Every 3 years from ages 24 through ages 1 to 46 with a history of 3 consecutive normal Pap tests.  Hepatitis C blood test.** / For any individual with known risks for hepatitis C.  Skin self-exam. / Monthly.  Influenza vaccine. / Every year.  Tetanus, diphtheria, and acellular pertussis (Tdap, Td) vaccine.** / Consult your health care provider. Pregnant women should receive 1 dose of Tdap vaccine during each pregnancy. 1 dose of Td every 10 years.  Varicella vaccine.** / Consult your health care provider. Pregnant females who do not have evidence of immunity should receive the first dose after pregnancy.  HPV vaccine. / 3 doses over 6 months, if 72 and younger. The vaccine is not recommended for use in pregnant females. However, pregnancy testing is not needed before receiving a dose.  Measles, mumps, rubella (MMR) vaccine.** / You need at least 1 dose of MMR if you were born in 1957 or later. You may also need a 2nd dose. For females of childbearing age, rubella immunity should be determined. If there is no evidence of immunity, females who are not pregnant should be vaccinated. If there is no evidence of immunity, females who are pregnant should delay immunization until after pregnancy.  Pneumococcal 13-valent conjugate (PCV13) vaccine.** / Consult your health care provider.  Pneumococcal polysaccharide (PPSV23) vaccine.** / 1 to 2 doses if you smoke cigarettes or if you have certain conditions.  Meningococcal vaccine.** /  1 dose if you are age 19 to 21 years and a first-year college  student living in a residence hall, or have one of several medical conditions, you need to get vaccinated against meningococcal disease. You may also need additional booster doses.  Hepatitis A vaccine.** / Consult your health care provider.  Hepatitis B vaccine.** / Consult your health care provider.  Haemophilus influenzae type b (Hib) vaccine.** / Consult your health care provider. Ages 40 to 64 years  Blood pressure check.** / Every 1 to 2 years.  Lipid and cholesterol check.** / Every 5 years beginning at age 20 years.  Lung cancer screening. / Every year if you are aged 55-80 years and have a 30-pack-year history of smoking and currently smoke or have quit within the past 15 years. Yearly screening is stopped once you have quit smoking for at least 15 years or develop a health problem that would prevent you from having lung cancer treatment.  Clinical breast exam.** / Every year after age 40 years.  BRCA-related cancer risk assessment.** / For women who have family members with a BRCA-related cancer (breast, ovarian, tubal, or peritoneal cancers).  Mammogram.** / Every year beginning at age 40 years and continuing for as long as you are in good health. Consult with your health care provider.  Pap test.** / Every 3 years starting at age 30 years through age 65 or 70 years with a history of 3 consecutive normal Pap tests.  HPV screening.** / Every 3 years from ages 30 years through ages 65 to 70 years with a history of 3 consecutive normal Pap tests.  Fecal occult blood test (FOBT) of stool. / Every year beginning at age 50 years and continuing until age 75 years. You may not need to do this test if you get a colonoscopy every 10 years.  Flexible sigmoidoscopy or colonoscopy.** / Every 5 years for a flexible sigmoidoscopy or every 10 years for a colonoscopy beginning at age 50 years and continuing until age 75 years.  Hepatitis C blood test.** / For all people born from 1945 through  1965 and any individual with known risks for hepatitis C.  Skin self-exam. / Monthly.  Influenza vaccine. / Every year.  Tetanus, diphtheria, and acellular pertussis (Tdap/Td) vaccine.** / Consult your health care provider. Pregnant women should receive 1 dose of Tdap vaccine during each pregnancy. 1 dose of Td every 10 years.  Varicella vaccine.** / Consult your health care provider. Pregnant females who do not have evidence of immunity should receive the first dose after pregnancy.  Zoster vaccine.** / 1 dose for adults aged 60 years or older.  Measles, mumps, rubella (MMR) vaccine.** / You need at least 1 dose of MMR if you were born in 1957 or later. You may also need a 2nd dose. For females of childbearing age, rubella immunity should be determined. If there is no evidence of immunity, females who are not pregnant should be vaccinated. If there is no evidence of immunity, females who are pregnant should delay immunization until after pregnancy.  Pneumococcal 13-valent conjugate (PCV13) vaccine.** / Consult your health care provider.  Pneumococcal polysaccharide (PPSV23) vaccine.** / 1 to 2 doses if you smoke cigarettes or if you have certain conditions.  Meningococcal vaccine.** / Consult your health care provider.  Hepatitis A vaccine.** / Consult your health care provider.  Hepatitis B vaccine.** / Consult your health care provider.  Haemophilus influenzae type b (Hib) vaccine.** / Consult your health care provider. Ages 65   years and over  Blood pressure check.** / Every 1 to 2 years.  Lipid and cholesterol check.** / Every 5 years beginning at age 22 years.  Lung cancer screening. / Every year if you are aged 73-80 years and have a 30-pack-year history of smoking and currently smoke or have quit within the past 15 years. Yearly screening is stopped once you have quit smoking for at least 15 years or develop a health problem that would prevent you from having lung cancer  treatment.  Clinical breast exam.** / Every year after age 4 years.  BRCA-related cancer risk assessment.** / For women who have family members with a BRCA-related cancer (breast, ovarian, tubal, or peritoneal cancers).  Mammogram.** / Every year beginning at age 40 years and continuing for as long as you are in good health. Consult with your health care provider.  Pap test.** / Every 3 years starting at age 9 years through age 34 or 91 years with 3 consecutive normal Pap tests. Testing can be stopped between 65 and 70 years with 3 consecutive normal Pap tests and no abnormal Pap or HPV tests in the past 10 years.  HPV screening.** / Every 3 years from ages 57 years through ages 64 or 45 years with a history of 3 consecutive normal Pap tests. Testing can be stopped between 65 and 70 years with 3 consecutive normal Pap tests and no abnormal Pap or HPV tests in the past 10 years.  Fecal occult blood test (FOBT) of stool. / Every year beginning at age 15 years and continuing until age 17 years. You may not need to do this test if you get a colonoscopy every 10 years.  Flexible sigmoidoscopy or colonoscopy.** / Every 5 years for a flexible sigmoidoscopy or every 10 years for a colonoscopy beginning at age 86 years and continuing until age 71 years.  Hepatitis C blood test.** / For all people born from [birth year] through 1963 and any individual with known risks for hepatitis C.  Osteoporosis screening.** / A one-time screening for women ages 83 years and over and women at risk for fractures or osteoporosis.  Skin self-exam. / Monthly.  Influenza vaccine. / Every year.  Tetanus, diphtheria, and acellular pertussis (Tdap/Td) vaccine.** / 1 dose of Td every 10 years.  Varicella vaccine.** / Consult your health care provider.  Zoster vaccine.** / 1 dose for adults aged 61 years or older.  Pneumococcal 13-valent conjugate (PCV13) vaccine.** / Consult your health care provider.  Pneumococcal  polysaccharide (PPSV23) vaccine.** / 1 dose for all adults aged 28 years and older.  Meningococcal vaccine.** / Consult your health care provider.  Hepatitis A vaccine.** / Consult your health care provider.  Hepatitis B vaccine.** / Consult your health care provider.  Haemophilus influenzae type b (Hib) vaccine.** / Consult your health care provider. ** Family history and personal history of risk and conditions may change your health care provider's recommendations. Document Released: 03/04/2001 Document Revised: 05/23/2013 Document Reviewed: 06/03/2010 [facility] Patient Information 2015 [place], [place]. This information is not intended to replace advice given to you by your health care provider. Make sure you discuss any questions you have with your health care provider.

## 2014-11-30 ENCOUNTER — Encounter: Payer: Self-pay | Admitting: Endocrinology

## 2014-11-30 ENCOUNTER — Ambulatory Visit (INDEPENDENT_AMBULATORY_CARE_PROVIDER_SITE_OTHER): Payer: Managed Care, Other (non HMO) | Admitting: Endocrinology

## 2014-11-30 VITALS — BP 127/82 | HR 71 | Temp 98.2°F | Ht 61.0 in | Wt 154.0 lb

## 2014-11-30 DIAGNOSIS — E119 Type 2 diabetes mellitus without complications: Secondary | ICD-10-CM | POA: Diagnosis not present

## 2014-11-30 LAB — POCT GLYCOSYLATED HEMOGLOBIN (HGB A1C): Hemoglobin A1C: 5.6

## 2014-11-30 MED ORDER — CANAGLIFLOZIN 300 MG PO TABS: 300.0000 mg | ORAL_TABLET | Freq: Every day | ORAL | Status: DC

## 2014-11-30 NOTE — Progress Notes (Signed)
   Subjective:    Patient ID: Nicole Richard, female    DOB: 02/15/1955, 59 y.o.   MRN: [id number]  HPI    Review of Systems     Objective:   Physical Exam        Assessment & Plan:  DM: well-controlled

## 2014-11-30 NOTE — Progress Notes (Signed)
Subjective:    Patient ID: Nicole Richard, female    DOB: 1955-07-31, 59 y.o.   MRN: [id number]  HPI Pt returns for f/u of diabetes mellitus: DM type: 2 Dx'ed: 123456 Complications: none Therapy: 3 oral meds GDM: never DKA: never Severe hypoglycemia: never Pancreatitis: never Other: she has never taken insulin Interval history: no cbg record, but states cbg's are in the low-100's. pt states she feels well in general. Past Medical History  Diagnosis Date  . Hypertension   . Hyperlipidemia   . GERD (gastroesophageal reflux disease)   . Asthma   . Allergy     seasonal    Past Surgical History  Procedure Laterality Date  . Tubal ligation    . Appendectomy    . Abdominal hysterectomy  08/2004  . Tonsillectomy    . Breast enhancement surgery  2001  . Colonoscopy    . Polypectomy  2009    sigmoid polyps    Social History   Social History  . Marital Status: Divorced    Spouse Name: N/A  . Number of Children: 2  . Years of Education: N/A   Occupational History  . [employer]--LPN   .    [name]     Social History Main Topics  . Smoking status: Never Smoker   . Smokeless tobacco: Never Used  . Alcohol Use: Yes     Comment: rare  . Drug Use: No  . Sexual Activity:    Partners: Male   Other Topics Concern  . Not on file   Social History Narrative   Exercise--no    Current Outpatient Prescriptions on File Prior to Visit  Medication Sig Dispense Refill  . aspirin 81 MG tablet Take 81 mg by mouth daily.    [name] atenolol (TENORMIN) 25 MG tablet Take 1 tablet by mouth daily-- 90 tablet 3  . atorvastatin (LIPITOR) 20 MG tablet Take 1 tablet (20 mg total) by mouth daily. 90 tablet 1  . Calcium Citrate-Vitamin D (CALCIUM + D PO) Take 600-1,000 mg by mouth 2 (two) times daily.    . Cholecalciferol (VITAMIN D PO) Take 600 Units by mouth daily.      [name] esomeprazole (NEXIUM) 40 MG capsule Take 1 capsule (40 mg total) by mouth daily. 90 capsule 3  . fenofibrate 160 MG  tablet Take 1 tablet (160 mg total) by mouth daily. 90 tablet 1  . Flaxseed, Linseed, (FLAXSEED OIL) 1200 MG CAPS Take 1 capsule by mouth daily.    [name] glucose blood (ONE TOUCH ULTRA TEST) test strip One touch ultra blue test strips. Check blood sugar twice daily E.119 100 each 12  . Ipratropium-Albuterol (COMBIVENT RESPIMAT) 20-100 MCG/ACT AERS respimat Inhale 1 puff into the lungs 4 (four) times daily. 1 Inhaler 3  . mometasone (NASONEX) 50 MCG/ACT nasal spray Place 2 sprays into the nose daily. 17 g 2  . montelukast (SINGULAIR) 10 MG tablet TAKE 1 TABLET DAILY 90 tablet 3  . Multiple Vitamin (MULTIVITAMIN WITH MINERALS) TABS tablet Take 1 tablet by mouth daily.    [name] DELICA LANCETS 99991111 MISC As directed 100 each 11  . Propylene Glycol (SYSTANE BALANCE) 0.6 % SOLN Apply 1 drop to eye as needed.    . Saxagliptin-Metformin (KOMBIGLYZE XR) 2.05-998 MG TB24 Take 2 tablets by mouth every morning. 60 tablet 11  . fluconazole (DIFLUCAN) 150 MG tablet Take 1 tablet (150 mg total) by mouth once. May repeat in 3 days prn (Patient not taking:  Reported on 11/30/2014) 2 tablet 1  . Hyoscyamine Sulfate 0.375 MG TBCR Take one tab twice a day for 5 days then as needed abdominal pain (Patient not taking: Reported on 11/30/2014) 25 tablet 1   No current facility-administered medications on file prior to visit.    Allergies  Allergen Reactions  . Contrast Media [Iodinated Diagnostic Agents] Anaphylaxis  . Metamucil [Psyllium] Anaphylaxis  . Mustard Seed Anaphylaxis    Family History  Problem Relation Age of Onset  . Coronary artery disease    . Hyperlipidemia    . Hypertension    . Asthma    . Cervical cancer Maternal Grandmother   . Breast cancer Maternal Grandmother   . Colon cancer Maternal Grandmother   . Uterine cancer Maternal Grandmother   . Rheumatic fever Mother   . Irritable bowel syndrome Mother   . Diabetes Father   . Colon polyps Father   . Kidney disease Father   . Esophageal  cancer Neg Hx   . Rectal cancer Neg Hx   . Stomach cancer Neg Hx     BP 127/82 mmHg  Pulse 71  Temp(Src) 98.2 F (36.8 C) (Oral)  Ht 5\' 1"  (1.549 m)  Wt 154 lb (69.854 kg)  BMI 29.11 kg/m2  SpO2 95%  Review of Systems She denies hypoglycemia.     Objective:   Physical Exam Pulses: dorsalis pedis intact bilat.   MSK: no deformity of the feet CV: no leg edema Skin:  no ulcer on the feet.  normal color and temp on the feet. Neuro: sensation is intact to touch on the feet.  Ext: right great toenail is absent   A1c=5.9%    Assessment & Plan:  DM: well-controlled  Patient is advised the following: Patient Instructions  check your blood sugar once a day.  vary the time of day when you check, between before the 3 meals, and at bedtime.  also check if you have symptoms of your blood sugar being too high or too low.  please keep a record of the readings and bring it to your next appointment here.  You can write it on any piece of paper.  please call us sooner if your blood sugar goes below 70, or if you have a lot of readings over 200.  Please come back for a follow-up appointment in 6 months.

## 2014-11-30 NOTE — Patient Instructions (Addendum)
check your blood sugar once a day.  vary the time of day when you check, between before the 3 meals, and at bedtime.  also check if you have symptoms of your blood sugar being too high or too low.  please keep a record of the readings and bring it to your next appointment here.  You can write it on any piece of paper.  please call us sooner if your blood sugar goes below 70, or if you have a lot of readings over 200.   Please come back for a follow-up appointment in 6 months.  

## 2014-12-01 ENCOUNTER — Other Ambulatory Visit: Payer: Self-pay | Admitting: Family Medicine

## 2014-12-23 ENCOUNTER — Encounter: Payer: Self-pay | Admitting: Family Medicine

## 2014-12-25 MED ORDER — ONETOUCH DELICA LANCETS 33G MISC: Status: DC

## 2014-12-25 NOTE — Telephone Encounter (Signed)
Medication filled to pharmacy as requested.   

## 2015-02-06 ENCOUNTER — Other Ambulatory Visit: Payer: Self-pay | Admitting: Family Medicine

## 2015-04-02 ENCOUNTER — Ambulatory Visit: Payer: Managed Care, Other (non HMO) | Admitting: Family Medicine

## 2015-05-08 ENCOUNTER — Ambulatory Visit: Payer: Managed Care, Other (non HMO) | Admitting: Family Medicine

## 2015-05-25 ENCOUNTER — Encounter: Payer: Self-pay | Admitting: Family Medicine

## 2015-05-25 ENCOUNTER — Ambulatory Visit (INDEPENDENT_AMBULATORY_CARE_PROVIDER_SITE_OTHER): Payer: Managed Care, Other (non HMO) | Admitting: Family Medicine

## 2015-05-25 VITALS — BP 116/80 | HR 70 | Temp 98.2°F | Wt 158.4 lb

## 2015-05-25 DIAGNOSIS — E1151 Type 2 diabetes mellitus with diabetic peripheral angiopathy without gangrene: Secondary | ICD-10-CM

## 2015-05-25 DIAGNOSIS — IMO0002 Reserved for concepts with insufficient information to code with codable children: Secondary | ICD-10-CM

## 2015-05-25 DIAGNOSIS — K219 Gastro-esophageal reflux disease without esophagitis: Secondary | ICD-10-CM

## 2015-05-25 DIAGNOSIS — J45901 Unspecified asthma with (acute) exacerbation: Secondary | ICD-10-CM

## 2015-05-25 DIAGNOSIS — E1165 Type 2 diabetes mellitus with hyperglycemia: Secondary | ICD-10-CM

## 2015-05-25 DIAGNOSIS — E785 Hyperlipidemia, unspecified: Secondary | ICD-10-CM | POA: Diagnosis not present

## 2015-05-25 DIAGNOSIS — I10 Essential (primary) hypertension: Secondary | ICD-10-CM

## 2015-05-25 LAB — MICROALBUMIN / CREATININE URINE RATIO
Creatinine,U: 106 mg/dL
MICROALB/CREAT RATIO: 0.7 mg/g (ref 0.0–30.0)
Microalb, Ur: 0.7 mg/dL (ref 0.0–1.9)

## 2015-05-25 LAB — LIPID PANEL
CHOLESTEROL: 164 mg/dL (ref 0–200)
HDL: 52.9 mg/dL (ref >39.00)
LDL Cholesterol: 94 mg/dL (ref 0–99)
NonHDL: 111.03
TRIGLYCERIDES: 87 mg/dL (ref 0.0–149.0)
Total CHOL/HDL Ratio: 3
VLDL: 17.4 mg/dL (ref 0.0–40.0)

## 2015-05-25 LAB — COMPREHENSIVE METABOLIC PANEL
ALBUMIN: 4.6 g/dL (ref 3.5–5.2)
ALK PHOS: 65 U/L (ref 39–117)
ALT: 34 U/L (ref 0–35)
AST: 28 U/L (ref 0–37)
BUN: 22 mg/dL (ref 6–23)
CALCIUM: 10.2 mg/dL (ref 8.4–10.5)
CO2: 30 mEq/L (ref 19–32)
Chloride: 106 mEq/L (ref 96–112)
Creatinine, Ser: 0.82 mg/dL (ref 0.40–1.20)
GFR: 76.14 mL/min (ref >60.00)
Glucose, Bld: 105 mg/dL — ABNORMAL HIGH (ref 70–99)
POTASSIUM: 5 meq/L (ref 3.5–5.1)
Sodium: 141 mEq/L (ref 135–145)
TOTAL PROTEIN: 7.4 g/dL (ref 6.0–8.3)
Total Bilirubin: 0.8 mg/dL (ref 0.2–1.2)

## 2015-05-25 LAB — POCT URINALYSIS DIPSTICK
Bilirubin, UA: NEGATIVE
Ketones, UA: NEGATIVE
LEUKOCYTES UA: NEGATIVE
NITRITE UA: NEGATIVE
PH UA: 6
PROTEIN UA: NEGATIVE
RBC UA: NEGATIVE
Spec Grav, UA: 1.025
Urobilinogen, UA: NEGATIVE

## 2015-05-25 LAB — HEMOGLOBIN A1C: HEMOGLOBIN A1C: 6.4 % (ref 4.6–6.5)

## 2015-05-25 MED ORDER — FENOFIBRATE 160 MG PO TABS: 160.0000 mg | ORAL_TABLET | Freq: Every day | ORAL | Status: DC

## 2015-05-25 MED ORDER — SAXAGLIPTIN-METFORMIN ER 2.5-1000 MG PO TB24
2.0000 | ORAL_TABLET | ORAL | Status: DC
Start: 2015-05-25 — End: 2015-06-04

## 2015-05-25 MED ORDER — ATENOLOL 25 MG PO TABS: ORAL_TABLET | ORAL | Status: DC

## 2015-05-25 MED ORDER — CANAGLIFLOZIN 300 MG PO TABS: 300.0000 mg | ORAL_TABLET | Freq: Every day | ORAL | Status: DC

## 2015-05-25 MED ORDER — ATORVASTATIN CALCIUM 20 MG PO TABS: 20.0000 mg | ORAL_TABLET | Freq: Every day | ORAL | Status: DC

## 2015-05-25 MED ORDER — ESOMEPRAZOLE MAGNESIUM 40 MG PO CPDR: 40.0000 mg | DELAYED_RELEASE_CAPSULE | Freq: Every day | ORAL | Status: DC

## 2015-05-25 MED ORDER — MONTELUKAST SODIUM 10 MG PO TABS: ORAL_TABLET | ORAL | Status: DC

## 2015-05-25 NOTE — Patient Instructions (Signed)

## 2015-05-25 NOTE — Progress Notes (Signed)
Pre visit review using our clinic review tool, if applicable. No additional management support is needed unless otherwise documented below in the visit note. 

## 2015-05-25 NOTE — Assessment & Plan Note (Signed)
Check labs con't meds 

## 2015-05-25 NOTE — Assessment & Plan Note (Signed)
Stable , con't meds  

## 2015-05-25 NOTE — Assessment & Plan Note (Signed)
Per endo Pt requesting labs be drawn today

## 2015-05-25 NOTE — Progress Notes (Signed)
Patient ID: Nicole Richard, female    DOB: 24-Jun-1955  Age: 60 y.o. MRN: [id number]    Subjective:  Subjective HPI Nicole Richard presents for f/u dm, bp and cholesterol.  She will see Dr Loanne Drilling on the 10th.   She is having surgery on her R shoulder and arm with Dr Nicholes Stairs on May 17.   HYPERTENSION  Blood pressure range-not checking   Chest pain- no      Dyspnea- no Lightheadedness- no   Edema- no Other side effects - no   Medication compliance: good Low salt diet- yes  DIABETES  Blood Sugar ranges-93-223   Ave about 115  Polyuria- no New Visual problems- no Hypoglycemic symptoms- no Other side effects-no Medication compliance - good Last eye exam- 01/2015 Foot exam- today  HYPERLIPIDEMIA  Medication compliance- no RUQ pain- no  Muscle aches- no Other side effects-no  Review of Systems  Constitutional: Negative for diaphoresis, appetite change, fatigue and unexpected weight change.  Eyes: Negative for pain, redness and visual disturbance.  Respiratory: Negative for cough, chest tightness, shortness of breath and wheezing.   Cardiovascular: Negative for chest pain, palpitations and leg swelling.  Endocrine: Negative for cold intolerance, heat intolerance, polydipsia, polyphagia and polyuria.  Genitourinary: Negative for dysuria, frequency and difficulty urinating.  Neurological: Negative for dizziness, light-headedness, numbness and headaches.    History Past Medical History  Diagnosis Date  . Hypertension   . Hyperlipidemia   . GERD (gastroesophageal reflux disease)   . Asthma   . Allergy     seasonal    She has past surgical history that includes Tubal ligation; Appendectomy; Abdominal hysterectomy (08/2004); Tonsillectomy; Breast enhancement surgery (2001); Colonoscopy; and Polypectomy (2009).   Her family history includes Breast cancer in her maternal grandmother; Cervical cancer in her maternal grandmother; Colon cancer in her maternal grandmother; Colon  polyps in her father; Diabetes in her father; Irritable bowel syndrome in her mother; Kidney disease in her father; Rheumatic fever in her mother; Uterine cancer in her maternal grandmother. There is no history of Esophageal cancer, Rectal cancer, or Stomach cancer.She reports that she has never smoked. She has never used smokeless tobacco. She reports that she drinks alcohol. She reports that she does not use illicit drugs.  Current Outpatient Prescriptions on File Prior to Visit  Medication Sig Dispense Refill  . aspirin 81 MG tablet Take 81 mg by mouth daily.    . Calcium Citrate-Vitamin D (CALCIUM + D PO) Take 600-1,000 mg by mouth 2 (two) times daily.    . Cholecalciferol (VITAMIN D PO) Take 600 Units by mouth daily.      . Flaxseed, Linseed, (FLAXSEED OIL) 1200 MG CAPS Take 1 capsule by mouth daily.    [name] glucose blood (ONE TOUCH ULTRA TEST) test strip One touch ultra blue test strips. Check blood sugar twice daily E.119 100 each 12  . Ipratropium-Albuterol (COMBIVENT RESPIMAT) 20-100 MCG/ACT AERS respimat Inhale 1 puff into the lungs 4 (four) times daily. 1 Inhaler 3  . mometasone (NASONEX) 50 MCG/ACT nasal spray Place 2 sprays into the nose daily. 17 g 2  . Multiple Vitamin (MULTIVITAMIN WITH MINERALS) TABS tablet Take 1 tablet by mouth daily.    [name] DELICA LANCETS 99991111 MISC Check Blood sugar twice daily. Dx:E11.9 100 each 12  . Propylene Glycol (SYSTANE BALANCE) 0.6 % SOLN Apply 1 drop to eye as needed.     No current facility-administered medications on file prior to visit.     Objective:  Objective Physical Exam  Constitutional: She is oriented to person, place, and time. She appears well-developed and well-nourished.  HENT:  Head: Normocephalic and atraumatic.  Eyes: Conjunctivae and EOM are normal.  Neck: Normal range of motion. Neck supple. No JVD present. Carotid bruit is not present. No thyromegaly present.  Cardiovascular: Normal rate, regular rhythm and normal heart  sounds.   No murmur heard. Pulmonary/Chest: Effort normal and breath sounds normal. No respiratory distress. She has no wheezes. She has no rales. She exhibits no tenderness.  Musculoskeletal: She exhibits tenderness. She exhibits no edema.  R shoulder pain  Neurological: She is alert and oriented to person, place, and time.  Psychiatric: She has a normal mood and affect.  Nursing note and vitals reviewed.  BP 116/80 mmHg  Pulse 70  Temp(Src) 98.2 F (36.8 C) (Oral)  Wt 158 lb 6.4 oz (71.85 kg)  SpO2 98% Wt Readings from Last 3 Encounters:  05/25/15 158 lb 6.4 oz (71.85 kg)  11/30/14 154 lb (69.854 kg)  10/02/14 152 lb 12.8 oz (69.31 kg)     Lab Results  Component Value Date   WBC 6.9 10/02/2014   HGB 14.5 10/02/2014   HCT 43.8 10/02/2014   PLT 330.0 10/02/2014   GLUCOSE 103* 10/02/2014   CHOL 158 10/02/2014   TRIG 72.0 10/02/2014   HDL 55.00 10/02/2014   LDLDIRECT 128.1 10/05/2013   LDLCALC 88 10/02/2014   ALT 21 10/02/2014   AST 18 10/02/2014   NA 144 10/02/2014   K 4.7 10/02/2014   CL 109 10/02/2014   CREATININE 0.80 10/02/2014   BUN 17 10/02/2014   CO2 28 10/02/2014   TSH 1.17 10/02/2014   HGBA1C 5.6 11/30/2014   MICROALBUR <0.7 10/02/2014    Mm Screening Breast W/implant Tomo Bilateral  09/13/2014  CLINICAL DATA:  Screening. EXAM: DIGITAL SCREENING BILATERAL MAMMOGRAM WITH IMPLANTS, 3D TOMO WITH CAD The patient has retropectoral implants. Standard and implant displaced views were performed. COMPARISON:  Previous exam(s). ACR Breast Density Category b: There are scattered areas of fibroglandular density. FINDINGS: There are no findings suspicious for malignancy. Images were processed with CAD. IMPRESSION: No mammographic evidence of malignancy. A result letter of this screening mammogram will be mailed directly to the patient. RECOMMENDATION: Screening mammogram in one year. (Code:SM-B-01Y) BI-RADS CATEGORY  1:  Negative. Electronically Signed   By: Margarette Canada  M.D.   On: 09/13/2014 12:06     Assessment & Plan:  Plan I have discontinued [name]'s Hyoscyamine Sulfate and fluconazole. I have also changed her fenofibrate, canagliflozin, and atorvastatin. Additionally, I am having her maintain her Cholecalciferol (VITAMIN D PO), Propylene Glycol, Flaxseed Oil, aspirin, multivitamin with minerals, Ipratropium-Albuterol, mometasone, glucose blood, Calcium Citrate-Vitamin D (CALCIUM + D PO), ONETOUCH DELICA LANCETS 99991111, Cranberry, montelukast, atenolol, esomeprazole, and Saxagliptin-Metformin.  Meds ordered this encounter  Medications  . Cranberry 250 MG TABS    Sig: Take 2 tablets by mouth daily.  . montelukast (SINGULAIR) 10 MG tablet    Sig: TAKE 1 TABLET DAILY    Dispense:  90 tablet    Refill:  3  . atenolol (TENORMIN) 25 MG tablet    Sig: Take 1 tablet by mouth daily--    Dispense:  90 tablet    Refill:  3  . esomeprazole (NEXIUM) 40 MG capsule    Sig: Take 1 capsule (40 mg total) by mouth daily.    Dispense:  90 capsule    Refill:  3  . Saxagliptin-Metformin (KOMBIGLYZE XR) 2.05-998  MG TB24    Sig: Take 2 tablets by mouth every morning.    Dispense:  60 tablet    Refill:  11  . fenofibrate 160 MG tablet    Sig: Take 1 tablet (160 mg total) by mouth daily.    Dispense:  90 tablet    Refill:  0  . canagliflozin (INVOKANA) 300 MG TABS tablet    Sig: Take 1 tablet (300 mg total) by mouth daily.    Dispense:  90 tablet    Refill:  3  . atorvastatin (LIPITOR) 20 MG tablet    Sig: Take 1 tablet (20 mg total) by mouth daily.    Dispense:  90 tablet    Refill:  0    Problem List Items Addressed This Visit      Unprioritized   GERD   Relevant Medications   esomeprazole (NEXIUM) 40 MG capsule    Other Visit Diagnoses    Hyperlipidemia    -  Primary    Relevant Medications    atenolol (TENORMIN) 25 MG tablet    fenofibrate 160 MG tablet    atorvastatin (LIPITOR) 20 MG tablet    Other Relevant Orders    Comprehensive metabolic  panel    Lipid panel    Microalbumin / creatinine urine ratio    POCT urinalysis dipstick    DM (diabetes mellitus) type II uncontrolled, periph vascular disorder (HCC)        Relevant Medications    atenolol (TENORMIN) 25 MG tablet    Saxagliptin-Metformin (KOMBIGLYZE XR) 2.05-998 MG TB24    fenofibrate 160 MG tablet    canagliflozin (INVOKANA) 300 MG TABS tablet    atorvastatin (LIPITOR) 20 MG tablet    Other Relevant Orders    Comprehensive metabolic panel    Hemoglobin A1c    Microalbumin / creatinine urine ratio    POCT urinalysis dipstick    Essential hypertension        Relevant Medications    atenolol (TENORMIN) 25 MG tablet    fenofibrate 160 MG tablet    atorvastatin (LIPITOR) 20 MG tablet    Other Relevant Orders    Comprehensive metabolic panel    Microalbumin / creatinine urine ratio    POCT urinalysis dipstick    Asthma with acute exacerbation, unspecified asthma severity        Relevant Medications    montelukast (SINGULAIR) 10 MG tablet       Follow-up: Return in about 6 months (around 11/25/2015), or if symptoms worsen or fail to improve, for hypertension, hyperlipidemia, diabetes II.  [name], DO

## 2015-05-30 ENCOUNTER — Encounter: Payer: Self-pay | Admitting: Endocrinology

## 2015-05-30 ENCOUNTER — Ambulatory Visit (INDEPENDENT_AMBULATORY_CARE_PROVIDER_SITE_OTHER): Payer: Managed Care, Other (non HMO) | Admitting: Endocrinology

## 2015-05-30 VITALS — BP 122/84 | HR 67 | Temp 98.1°F | Ht 61.0 in | Wt 158.0 lb

## 2015-05-30 DIAGNOSIS — E119 Type 2 diabetes mellitus without complications: Secondary | ICD-10-CM | POA: Diagnosis not present

## 2015-05-30 MED ORDER — BROMOCRIPTINE MESYLATE 2.5 MG PO TABS: ORAL_TABLET | ORAL | Status: DC

## 2015-05-30 NOTE — Progress Notes (Signed)
Subjective:    Patient ID: Nicole Richard, female    DOB: [birth year], 60 y.o.   MRN: [id number]  HPI Pt returns for f/u of diabetes mellitus: DM type: 2 Dx'ed: 123456 Complications: none Therapy: 3 oral meds GDM: never DKA: never Severe hypoglycemia: never Pancreatitis: never Other: she has never taken insulin Interval history: no cbg record, but states cbg's are well-controlled. pt states she feels well in general.   Past Medical History  Diagnosis Date  . Hypertension   . Hyperlipidemia   . GERD (gastroesophageal reflux disease)   . Asthma   . Allergy     seasonal    Past Surgical History  Procedure Laterality Date  . Tubal ligation    . Appendectomy    . Abdominal hysterectomy  08/2004  . Tonsillectomy    . Breast enhancement surgery  2001  . Colonoscopy    . Polypectomy  2009    sigmoid polyps    Social History   Social History  . Marital Status: Divorced    Spouse Name: N/A  . Number of Children: 2  . Years of Education: N/A   Occupational History  . [employer]--LPN   .    [name]     Social History Main Topics  . Smoking status: Never Smoker   . Smokeless tobacco: Never Used  . Alcohol Use: Yes     Comment: rare  . Drug Use: No  . Sexual Activity:    Partners: Male   Other Topics Concern  . Not on file   Social History Narrative   Exercise--no    Current Outpatient Prescriptions on File Prior to Visit  Medication Sig Dispense Refill  . aspirin 81 MG tablet Take 81 mg by mouth daily.    [name] atenolol (TENORMIN) 25 MG tablet Take 1 tablet by mouth daily-- 90 tablet 3  . atorvastatin (LIPITOR) 20 MG tablet Take 1 tablet (20 mg total) by mouth daily. 90 tablet 0  . Calcium Citrate-Vitamin D (CALCIUM + D PO) Take 600-1,000 mg by mouth 2 (two) times daily.    . canagliflozin (INVOKANA) 300 MG TABS tablet Take 1 tablet (300 mg total) by mouth daily. 90 tablet 3  . Cholecalciferol (VITAMIN D PO) Take 600 Units by mouth daily.      . Cranberry 250  MG TABS Take 2 tablets by mouth daily.    [name] esomeprazole (NEXIUM) 40 MG capsule Take 1 capsule (40 mg total) by mouth daily. 90 capsule 3  . fenofibrate 160 MG tablet Take 1 tablet (160 mg total) by mouth daily. 90 tablet 0  . Flaxseed, Linseed, (FLAXSEED OIL) 1200 MG CAPS Take 1 capsule by mouth daily.    [name] glucose blood (ONE TOUCH ULTRA TEST) test strip One touch ultra blue test strips. Check blood sugar twice daily E.119 100 each 12  . Ipratropium-Albuterol (COMBIVENT RESPIMAT) 20-100 MCG/ACT AERS respimat Inhale 1 puff into the lungs 4 (four) times daily. 1 Inhaler 3  . mometasone (NASONEX) 50 MCG/ACT nasal spray Place 2 sprays into the nose daily. 17 g 2  . montelukast (SINGULAIR) 10 MG tablet TAKE 1 TABLET DAILY 90 tablet 3  . Multiple Vitamin (MULTIVITAMIN WITH MINERALS) TABS tablet Take 1 tablet by mouth daily.    [name] DELICA LANCETS 99991111 MISC Check Blood sugar twice daily. Dx:E11.9 100 each 12  . Propylene Glycol (SYSTANE BALANCE) 0.6 % SOLN Apply 1 drop to eye as needed.    . Saxagliptin-Metformin (KOMBIGLYZE XR)  2.05-998 MG TB24 Take 2 tablets by mouth every morning. 60 tablet 11   No current facility-administered medications on file prior to visit.    Allergies  Allergen Reactions  . Contrast Media [Iodinated Diagnostic Agents] Anaphylaxis  . Metamucil [Psyllium] Anaphylaxis  . Mustard Seed Anaphylaxis    Family History  Problem Relation Age of Onset  . Coronary artery disease    . Hyperlipidemia    . Hypertension    . Asthma    . Cervical cancer Maternal Grandmother   . Breast cancer Maternal Grandmother   . Colon cancer Maternal Grandmother   . Uterine cancer Maternal Grandmother   . Rheumatic fever Mother   . Irritable bowel syndrome Mother   . Diabetes Father   . Colon polyps Father   . Kidney disease Father   . Esophageal cancer Neg Hx   . Rectal cancer Neg Hx   . Stomach cancer Neg Hx     BP 122/84 mmHg  Pulse 67  Temp(Src) 98.1 F (36.7 C) (Oral)   Ht 5\' 1"  (1.549 m)  Wt 158 lb (71.668 kg)  BMI 29.87 kg/m2  SpO2 94%  Review of Systems She denies hypoglycemia    Objective:   Physical Exam VITAL SIGNS:  See vs page GENERAL: no distress Pulses: dorsalis pedis intact bilat.   MSK: no deformity of the feet CV: no leg edema Skin:  no ulcer on the feet.  normal color and temp on the feet. Neuro: sensation is intact to touch on the feet Ext: There is bilateral onychomycosis of the toenails  Lab Results  Component Value Date   CREATININE 0.82 05/25/2015   BUN 22 05/25/2015   NA 141 05/25/2015   K 5.0 05/25/2015   CL 106 05/25/2015   CO2 30 05/25/2015    Lab Results  Component Value Date   HGBA1C 6.4 05/25/2015      Assessment & Plan:  DM: she needs increased rx, if it can be done with a regimen that avoids or minimizes hypoglycemia.   Patient is advised the following: Patient Instructions  check your blood sugar once a day.  vary the time of day when you check, between before the 3 meals, and at bedtime.  also check if you have symptoms of your blood sugar being too high or too low.  please keep a record of the readings and bring it to your next appointment here.  You can write it on any piece of paper.  please call us sooner if your blood sugar goes below 70, or if you have a lot of readings over 200.  i have sent a prescription to your pharmacy, to add "bromocriptine."  It has possible side effects of nausea and dizziness.  These go away with time.  You can avoid these by taking it at bedtime.     Please come back for a follow-up appointment in 6 months.

## 2015-05-30 NOTE — Patient Instructions (Addendum)
check your blood sugar once a day.  vary the time of day when you check, between before the 3 meals, and at bedtime.  also check if you have symptoms of your blood sugar being too high or too low.  please keep a record of the readings and bring it to your next appointment here.  You can write it on any piece of paper.  please call us sooner if your blood sugar goes below 70, or if you have a lot of readings over 200.  i have sent a prescription to your pharmacy, to add "bromocriptine."  It has possible side effects of nausea and dizziness.  These go away with time.  You can avoid these by taking it at bedtime.     Please come back for a follow-up appointment in 6 months.

## 2015-06-01 ENCOUNTER — Other Ambulatory Visit: Payer: Self-pay | Admitting: Emergency Medicine

## 2015-06-01 ENCOUNTER — Encounter: Payer: Self-pay | Admitting: Family Medicine

## 2015-06-01 DIAGNOSIS — J302 Other seasonal allergic rhinitis: Secondary | ICD-10-CM

## 2015-06-01 DIAGNOSIS — J45901 Unspecified asthma with (acute) exacerbation: Secondary | ICD-10-CM

## 2015-06-01 MED ORDER — MOMETASONE FUROATE 50 MCG/ACT NA SUSP: 2.0000 | Freq: Every day | NASAL | Status: DC

## 2015-06-01 MED ORDER — IPRATROPIUM-ALBUTEROL 20-100 MCG/ACT IN AERS
1.0000 | INHALATION_SPRAY | Freq: Four times a day (QID) | RESPIRATORY_TRACT | Status: DC
Start: 2015-06-01 — End: 2015-12-04

## 2015-06-01 NOTE — Telephone Encounter (Signed)
Rx sent to pharmacy   

## 2015-06-02 ENCOUNTER — Encounter: Payer: Self-pay | Admitting: Family Medicine

## 2015-06-02 DIAGNOSIS — I10 Essential (primary) hypertension: Secondary | ICD-10-CM

## 2015-06-02 DIAGNOSIS — E1151 Type 2 diabetes mellitus with diabetic peripheral angiopathy without gangrene: Secondary | ICD-10-CM

## 2015-06-02 DIAGNOSIS — E785 Hyperlipidemia, unspecified: Secondary | ICD-10-CM

## 2015-06-02 DIAGNOSIS — K219 Gastro-esophageal reflux disease without esophagitis: Secondary | ICD-10-CM

## 2015-06-02 DIAGNOSIS — J45901 Unspecified asthma with (acute) exacerbation: Secondary | ICD-10-CM

## 2015-06-02 DIAGNOSIS — IMO0002 Reserved for concepts with insufficient information to code with codable children: Secondary | ICD-10-CM

## 2015-06-02 DIAGNOSIS — E1165 Type 2 diabetes mellitus with hyperglycemia: Secondary | ICD-10-CM

## 2015-06-04 MED ORDER — MONTELUKAST SODIUM 10 MG PO TABS: ORAL_TABLET | ORAL | Status: DC

## 2015-06-04 MED ORDER — SAXAGLIPTIN-METFORMIN ER 2.5-1000 MG PO TB24: 2.0000 | ORAL_TABLET | ORAL | Status: DC

## 2015-06-04 MED ORDER — ATORVASTATIN CALCIUM 20 MG PO TABS
20.0000 mg | ORAL_TABLET | Freq: Every day | ORAL | Status: DC
Start: 2015-06-04 — End: 2015-11-05

## 2015-06-04 MED ORDER — CANAGLIFLOZIN 300 MG PO TABS
300.0000 mg | ORAL_TABLET | Freq: Every day | ORAL | Status: DC
Start: 2015-06-04 — End: 2016-04-04

## 2015-06-04 MED ORDER — FENOFIBRATE 160 MG PO TABS: 160.0000 mg | ORAL_TABLET | Freq: Every day | ORAL | Status: DC

## 2015-06-04 MED ORDER — ATENOLOL 25 MG PO TABS: ORAL_TABLET | ORAL | Status: DC

## 2015-06-04 MED ORDER — ESOMEPRAZOLE MAGNESIUM 40 MG PO CPDR: 40.0000 mg | DELAYED_RELEASE_CAPSULE | Freq: Every day | ORAL | Status: DC

## 2015-06-06 HISTORY — PX: SHOULDER ARTHROSCOPY W/ ROTATOR CUFF REPAIR: SHX2400

## 2015-08-01 ENCOUNTER — Other Ambulatory Visit: Payer: Self-pay | Admitting: Family Medicine

## 2015-08-01 DIAGNOSIS — Z1231 Encounter for screening mammogram for malignant neoplasm of breast: Secondary | ICD-10-CM

## 2015-09-14 ENCOUNTER — Ambulatory Visit
Admission: RE | Admit: 2015-09-14 | Discharge: 2015-09-14 | Disposition: A | Discharge status: Home / Self Care | Payer: Managed Care, Other (non HMO) | Source: Ambulatory Visit | Attending: Family Medicine | Admitting: Family Medicine

## 2015-09-14 DIAGNOSIS — Z1231 Encounter for screening mammogram for malignant neoplasm of breast: Secondary | ICD-10-CM

## 2015-09-18 ENCOUNTER — Ambulatory Visit (INDEPENDENT_AMBULATORY_CARE_PROVIDER_SITE_OTHER): Payer: Self-pay | Admitting: Neurology

## 2015-09-18 ENCOUNTER — Ambulatory Visit (INDEPENDENT_AMBULATORY_CARE_PROVIDER_SITE_OTHER): Payer: Managed Care, Other (non HMO) | Admitting: Neurology

## 2015-09-18 DIAGNOSIS — G5601 Carpal tunnel syndrome, right upper limb: Secondary | ICD-10-CM

## 2015-09-18 DIAGNOSIS — G5600 Carpal tunnel syndrome, unspecified upper limb: Secondary | ICD-10-CM

## 2015-09-18 DIAGNOSIS — Z0289 Encounter for other administrative examinations: Secondary | ICD-10-CM

## 2015-09-18 NOTE — Progress Notes (Signed)
  [facility]    Provider:  Dr Jaynee Eagles Referring Provider:  Dr. French Ana Primary Care Physician:  [name], DO  History:  Nicole Richard is a 60 y.o. female here as a referral from Dr. 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 In July after surgery she started having pain and tingling in the right hand and wrist. Paresthesias improved but still with pain in the palm and difficulty bending fingers with swelling. No neck pain or radicular symptoms.   Summary: Nerve Conduction studies were performed on the bilateral upper extremities.   Ulnar(ADM)  and Median(APB) motor conductions were within normal limits with normal F wave latencies.  2nd-digit Median, 5th-digit Ulnar, Radial sensory conductions were within normal limits.   The right median/ulnar (palm) comparison nerve showed normal distal peak latencies and normal  peak latency differences.   EMG needle evaluation was performed on selected right upper extremity muscles: The right Deltoid, right Triceps, right Flexor Digitorum Profundus (ulnar), right Pronator Teres, right First Dorsal Interoseous, right Opponens Pollicis and right lower paraspinal muscles were normal.  Conclusion: This is a normal study. No electrophysiologic evidence for median or ulnar neuropathy or cervical radiculopathy.  Sarina Ill, MD  [facility] [address] [place], [place] [address]  Phone [phone] Fax [phone]

## 2015-09-18 NOTE — Procedures (Signed)
[facility]    Provider:  Dr Jaynee Eagles Referring Provider:  Dr. French Ana Primary Care Physician:  [name], DO  History:  Nicole Richard is a 60 y.o. female here as a referral from Dr. 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 In July after surgery she started having pain and tingling in the right hand and wrist. Paresthesias improved but still with pain in the palm and difficulty bending fingers with swelling. No neck pain or radicular symptoms.   Summary: Nerve Conduction studies were performed on the bilateral upper extremities.   Ulnar(ADM)  and Median(APB) motor conductions were within normal limits with normal F wave latencies.  2nd-digit Median, 5th-digit Ulnar, Radial sensory conductions were within normal limits.   The right median/ulnar (palm) comparison nerve showed normal distal peak latencies and normal  peak latency differences.   EMG needle evaluation was performed on selected right upper extremity muscles: The right Deltoid, right Triceps, right Flexor Digitorum Profundus (ulnar), right Pronator Teres, right First Dorsal Interoseous, right Opponens Pollicis and right lower paraspinal muscles were normal.  Conclusion: This is a normal study. No electrophysiologic evidence for right-sided median or ulnar neuropathy or right-sided cervical radiculopathy.  Sarina Ill, MD  [facility] [address] [place], [place] [address]  Phone [phone] Fax [phone]

## 2015-09-18 NOTE — Progress Notes (Signed)
See procedure note.

## 2015-10-04 ENCOUNTER — Ambulatory Visit (INDEPENDENT_AMBULATORY_CARE_PROVIDER_SITE_OTHER): Payer: Managed Care, Other (non HMO) | Admitting: Family Medicine

## 2015-10-04 ENCOUNTER — Encounter: Payer: Self-pay | Admitting: Family Medicine

## 2015-10-04 VITALS — BP 120/70 | HR 62 | Temp 98.3°F | Resp 16 | Ht 61.0 in | Wt 158.8 lb

## 2015-10-04 DIAGNOSIS — Z23 Encounter for immunization: Secondary | ICD-10-CM | POA: Diagnosis not present

## 2015-10-04 DIAGNOSIS — E785 Hyperlipidemia, unspecified: Secondary | ICD-10-CM | POA: Diagnosis not present

## 2015-10-04 DIAGNOSIS — M6530 Trigger finger, unspecified finger: Secondary | ICD-10-CM | POA: Diagnosis not present

## 2015-10-04 DIAGNOSIS — I10 Essential (primary) hypertension: Secondary | ICD-10-CM

## 2015-10-04 DIAGNOSIS — Z0000 Encounter for general adult medical examination without abnormal findings: Secondary | ICD-10-CM

## 2015-10-04 LAB — LIPID PANEL
CHOL/HDL RATIO: 3
Cholesterol: 171 mg/dL (ref 0–200)
HDL: 51.6 mg/dL (ref >39.00)
LDL CALC: 96 mg/dL (ref 0–99)
NONHDL: 119.33
Triglycerides: 116 mg/dL (ref 0.0–149.0)
VLDL: 23.2 mg/dL (ref 0.0–40.0)

## 2015-10-04 LAB — COMPREHENSIVE METABOLIC PANEL
ALT: 34 U/L (ref 0–35)
AST: 29 U/L (ref 0–37)
Albumin: 4.6 g/dL (ref 3.5–5.2)
Alkaline Phosphatase: 68 U/L (ref 39–117)
BILIRUBIN TOTAL: 0.6 mg/dL (ref 0.2–1.2)
BUN: 21 mg/dL (ref 6–23)
CHLORIDE: 106 meq/L (ref 96–112)
CO2: 29 meq/L (ref 19–32)
CREATININE: 0.73 mg/dL (ref 0.40–1.20)
Calcium: 9.9 mg/dL (ref 8.4–10.5)
GFR: 86.96 mL/min (ref >60.00)
GLUCOSE: 91 mg/dL (ref 70–99)
Potassium: 4.4 mEq/L (ref 3.5–5.1)
Sodium: 141 mEq/L (ref 135–145)
Total Protein: 7.6 g/dL (ref 6.0–8.3)

## 2015-10-04 LAB — CBC WITH DIFFERENTIAL/PLATELET
BASOS PCT: 0.5 % (ref 0.0–3.0)
Basophils Absolute: 0 10*3/uL (ref 0.0–0.1)
EOS PCT: 3.6 % (ref 0.0–5.0)
Eosinophils Absolute: 0.3 10*3/uL (ref 0.0–0.7)
HEMATOCRIT: 44.2 % (ref 36.0–46.0)
Hemoglobin: 15 g/dL (ref 12.0–15.0)
LYMPHS PCT: 31.3 % (ref 12.0–46.0)
Lymphs Abs: 2.2 10*3/uL (ref 0.7–4.0)
MCHC: 33.9 g/dL (ref 30.0–36.0)
MCV: 87.3 fl (ref 78.0–100.0)
MONOS PCT: 8.6 % (ref 3.0–12.0)
Monocytes Absolute: 0.6 10*3/uL (ref 0.1–1.0)
NEUTROS ABS: 4 10*3/uL (ref 1.4–7.7)
Neutrophils Relative %: 56 % (ref 43.0–77.0)
PLATELETS: 326 10*3/uL (ref 150.0–400.0)
RBC: 5.06 Mil/uL (ref 3.87–5.11)
RDW: 13.4 % (ref 11.5–15.5)
WBC: 7.1 10*3/uL (ref 4.0–10.5)

## 2015-10-04 LAB — TSH: TSH: 0.96 u[IU]/mL (ref 0.35–4.50)

## 2015-10-04 LAB — MICROALBUMIN / CREATININE URINE RATIO
Creatinine,U: 95.9 mg/dL
MICROALB/CREAT RATIO: 0.7 mg/g (ref 0.0–30.0)
Microalb, Ur: <0.7 mg/dL (ref 0.0–1.9)

## 2015-10-04 NOTE — Progress Notes (Signed)
Subjective:     Nicole Richard is a 60 y.o. female and is here for a comprehensive physical exam. The patient reports problems - gerd symptoms ---her ins stopped paying for the nexium .she will check with the insurance co to see what they will pay for.   Pt also c/o R index finger getting "stuck" bent and then snaps up -- causing pain  HPI HYPERTENSION   Blood pressure range-not checking  Chest pain- no      Dyspnea- no Lightheadedness- no   Edema- no  Other side effects - no   Medication compliance: good Low salt diet- yes    DIABETES    Blood Sugar ranges-96-132  Polyuria- no New Visual problems- no  Hypoglycemic symptoms- no  Other side effects-no Medication compliance - good Last eye exam- 01/2015 Foot exam- endo   HYPERLIPIDEMIA  Medication compliance- good RUQ pain- no  Muscle aches- no Other side effects-no   Social History   Social History  . Marital status: Divorced    Spouse name: N/A  . Number of children: 2  . Years of education: N/A   Occupational History  . [employer]-- [place]     Social History Main Topics  . Smoking status: Never Smoker  . Smokeless tobacco: Never Used  . Alcohol use Yes     Comment: rare  . Drug use: No  . Sexual activity: Not Currently    Partners: Male   Other Topics Concern  . Not on file   Social History Narrative   Exercise--walking , and pt daily   Health Maintenance  Topic Date Due  . HEMOGLOBIN A1C  11/25/2015  . OPHTHALMOLOGY EXAM  01/23/2016  . URINE MICROALBUMIN  05/24/2016  . FOOT EXAM  05/29/2016  . PAP SMEAR  09/29/2016  . COLONOSCOPY  08/16/2017  . MAMMOGRAM  09/13/2017  . TETANUS/TDAP  10/19/2022  . PNEUMOCOCCAL POLYSACCHARIDE VACCINE  Completed  . INFLUENZA VACCINE  Completed  . Hepatitis C Screening  Completed  . HIV Screening  Completed    The following portions of the patient's history were reviewed and updated as appropriate:  She  has a past medical history of Allergy; Asthma; GERD  (gastroesophageal reflux disease); Hyperlipidemia; and Hypertension. She  does not have any pertinent problems on file. She  has a past surgical history that includes Tubal ligation; Appendectomy; Abdominal hysterectomy (08/2004); Tonsillectomy; Breast enhancement surgery (2001); Colonoscopy; Polypectomy (2009); and Shoulder arthroscopy w/ rotator cuff repair (Right, 06/06/2015). Her family history includes Breast cancer in her maternal grandmother; Cervical cancer in her maternal grandmother; Colon cancer in her maternal grandmother; Colon polyps in her father; Diabetes in her father; Irritable bowel syndrome in her mother; Kidney disease in her father; Rheumatic fever in her mother; Uterine cancer in her maternal grandmother. She  reports that she has never smoked. She has never used smokeless tobacco. She reports that she drinks alcohol. She reports that she does not use drugs. She has a current medication list which includes the following prescription(s): aspirin, atenolol, atorvastatin, bromocriptine, calcium citrate-vitamin d, canagliflozin, cranberry, esomeprazole, fenofibrate, flaxseed oil, glucose blood, ipratropium-albuterol, mometasone, montelukast, multivitamin with minerals, onetouch delica lancets 0000000, propylene glycol, saxagliptin-metformin, and cholecalciferol. Current Outpatient Prescriptions on File Prior to Visit  Medication Sig Dispense Refill  . aspirin 81 MG tablet Take 81 mg by mouth daily.    [name] atenolol (TENORMIN) 25 MG tablet Take 1 tablet by mouth daily-- 90 tablet 3  . atorvastatin (LIPITOR) 20 MG tablet Take 1 tablet (  20 mg total) by mouth daily. 90 tablet 0  . bromocriptine (PARLODEL) 2.5 MG tablet 1/4 tab daily 24 tablet 3  . Calcium Citrate-Vitamin D (CALCIUM + D PO) Take 600-1,000 mg by mouth 2 (two) times daily.    . canagliflozin (INVOKANA) 300 MG TABS tablet Take 1 tablet (300 mg total) by mouth daily. 90 tablet 3  . Cranberry 250 MG TABS Take 2 tablets by mouth daily.     [name] esomeprazole (NEXIUM) 40 MG capsule Take 1 capsule (40 mg total) by mouth daily. 90 capsule 3  . fenofibrate 160 MG tablet Take 1 tablet (160 mg total) by mouth daily. 90 tablet 0  . Flaxseed, Linseed, (FLAXSEED OIL) 1200 MG CAPS Take 1 capsule by mouth daily.    [name] glucose blood (ONE TOUCH ULTRA TEST) test strip One touch ultra blue test strips. Check blood sugar twice daily E.119 100 each 12  . Ipratropium-Albuterol (COMBIVENT RESPIMAT) 20-100 MCG/ACT AERS respimat Inhale 1 puff into the lungs 4 (four) times daily. 1 Inhaler 3  . mometasone (NASONEX) 50 MCG/ACT nasal spray Place 2 sprays into the nose daily. 17 g 2  . montelukast (SINGULAIR) 10 MG tablet TAKE 1 TABLET DAILY 90 tablet 3  . Multiple Vitamin (MULTIVITAMIN WITH MINERALS) TABS tablet Take 1 tablet by mouth daily.    [name] DELICA LANCETS 99991111 MISC Check Blood sugar twice daily. Dx:E11.9 100 each 12  . Propylene Glycol (SYSTANE BALANCE) 0.6 % SOLN Apply 1 drop to eye as needed.    . Saxagliptin-Metformin (KOMBIGLYZE XR) 2.05-998 MG TB24 Take 2 tablets by mouth every morning. 180 tablet 3  . Cholecalciferol (VITAMIN D PO) Take 600 Units by mouth daily.       No current facility-administered medications on file prior to visit.    She is allergic to contrast media [iodinated diagnostic agents]; metamucil [psyllium]; and mustard seed..  Review of Systems Review of Systems  Constitutional: Negative for activity change, appetite change and fatigue.  HENT: Negative for hearing loss, congestion, tinnitus and ear discharge.  dentist q64m Eyes: Negative for visual disturbance (see optho q1y -- vision corrected to 20/20 with glasses).  Respiratory: Negative for cough, chest tightness and shortness of breath.   Cardiovascular: Negative for chest pain, palpitations and leg swelling.  Gastrointestinal: Negative for abdominal pain, diarrhea, constipation and abdominal distention.  Genitourinary: Negative for urgency, frequency,  decreased urine volume and difficulty urinating.  Musculoskeletal: Negative for back pain, arthralgias and gait problem.  Skin: Negative for color change, pallor and rash.  Neurological: Negative for dizziness, light-headedness, numbness and headaches.  Hematological: Negative for adenopathy. Does not bruise/bleed easily.  Psychiatric/Behavioral: Negative for suicidal ideas, confusion, sleep disturbance, self-injury, dysphoric mood, decreased concentration and agitation.       Objective:    BP 120/70 (BP Location: Right Arm, Patient Position: Sitting, Cuff Size: Large)   Pulse 62   Temp 98.3 F (36.8 C) (Oral)   Resp 16   Ht 5\' 1"  (1.549 m)   Wt 158 lb 12.8 oz (72 kg)   SpO2 98%   BMI 30.00 kg/m  General appearance: alert, cooperative, appears stated age and no distress Head: Normocephalic, without obvious abnormality, atraumatic Eyes: conjunctivae/corneas clear. PERRL, EOM's intact. Fundi benign. Ears: normal TM's and external ear canals both ears Nose: Nares normal. Septum midline. Mucosa normal. No drainage or sinus tenderness. Throat: lips, mucosa, and tongue normal; teeth and gums normal Neck: no adenopathy, no carotid bruit, no JVD, supple, symmetrical, trachea midline and  thyroid not enlarged, symmetric, no tenderness/mass/nodules Back: symmetric, no curvature. ROM normal. No CVA tenderness. Lungs: clear to auscultation bilaterally Breasts: normal appearance, no masses or tenderness Heart: regular rate and rhythm, S1, S2 normal, no murmur, click, rub or gallop Abdomen: soft, non-tender; bowel sounds normal; no masses,  no organomegaly Pelvic: deferred Extremities: extremities normal, atraumatic, no cyanosis or edema Pulses: 2+ and symmetric Skin: Skin color, texture, turgor normal. No rashes or lesions Lymph nodes: Cervical, supraclavicular, and axillary nodes normal. Neurologic: Alert and oriented X 3, normal strength and tone. Normal symmetric reflexes. Normal  coordination and gait    Assessment:    Healthy female exam.    Plan:  See avs ghm utd Check labs   See After Visit Summary for Counseling Recommendations    1. Encounter for immunization  - Flu Vaccine QUAD 36+ mos IM  2. Hyperlipidemia LDL goal <70 Check labs - CBC with Differential/Platelet - Lipid panel - Comprehensive metabolic panel - POCT urinalysis dipstick - Microalbumin / creatinine urine ratio - TSH  3. Preventative health care See above - CBC with Differential/Platelet - Lipid panel - Comprehensive metabolic panel - POCT urinalysis dipstick - Microalbumin / creatinine urine ratio - TSH  4. Essential hypertension Stable con't meds - CBC with Differential/Platelet - Lipid panel - Comprehensive metabolic panel - POCT urinalysis dipstick - Microalbumin / creatinine urine ratio - TSH

## 2015-10-04 NOTE — Patient Instructions (Signed)
Preventive Care for Adults, Female A healthy lifestyle and preventive care can promote health and wellness. Preventive health guidelines for women include the following key practices.  A routine yearly physical is a good way to check with your health care provider about your health and preventive screening. It is a chance to share any concerns and updates on your health and to receive a thorough exam.  Visit your dentist for a routine exam and preventive care every 6 months. Brush your teeth twice a day and floss once a day. Good oral hygiene prevents tooth decay and gum disease.  The frequency of eye exams is based on your age, health, family medical history, use of contact lenses, and other factors. Follow your health care provider's recommendations for frequency of eye exams.  Eat a healthy diet. Foods like vegetables, fruits, whole grains, low-fat dairy products, and lean protein foods contain the nutrients you need without too many calories. Decrease your intake of foods high in solid fats, added sugars, and salt. Eat the right amount of calories for you.Get information about a proper diet from your health care provider, if necessary.  Regular physical exercise is one of the most important things you can do for your health. Most adults should get at least 150 minutes of moderate-intensity exercise (any activity that increases your heart rate and causes you to sweat) each week. In addition, most adults need muscle-strengthening exercises on 2 or more days a week.  Maintain a healthy weight. The body mass index (BMI) is a screening tool to identify possible weight problems. It provides an estimate of body fat based on height and weight. Your health care provider can find your BMI and can help you achieve or maintain a healthy weight.For adults 20 years and older:  A BMI below 18.5 is considered underweight.  A BMI of 18.5 to 24.9 is normal.  A BMI of 25 to 29.9 is considered overweight.  A  BMI of 30 and above is considered obese.  Maintain normal blood lipids and cholesterol levels by exercising and minimizing your intake of saturated fat. Eat a balanced diet with plenty of fruit and vegetables. Blood tests for lipids and cholesterol should begin at age 45 and be repeated every 5 years. If your lipid or cholesterol levels are high, you are over 50, or you are at high risk for heart disease, you may need your cholesterol levels checked more frequently.Ongoing high lipid and cholesterol levels should be treated with medicines if diet and exercise are not working.  If you smoke, find out from your health care provider how to quit. If you do not use tobacco, do not start.  Lung cancer screening is recommended for adults aged 45-80 years who are at high risk for developing lung cancer because of a history of smoking. A yearly low-dose CT scan of the lungs is recommended for people who have at least a 30-pack-year history of smoking and are a current smoker or have quit within the past 15 years. A pack year of smoking is smoking an average of 1 pack of cigarettes a day for 1 year (for example: 1 pack a day for 30 years or 2 packs a day for 15 years). Yearly screening should continue until the smoker has stopped smoking for at least 15 years. Yearly screening should be stopped for people who develop a health problem that would prevent them from having lung cancer treatment.  If you are pregnant, do not drink alcohol. If you are  breastfeeding, be very cautious about drinking alcohol. If you are not pregnant and choose to drink alcohol, do not have more than 1 drink per day. One drink is considered to be 12 ounces (355 mL) of beer, 5 ounces (148 mL) of wine, or 1.5 ounces (44 mL) of liquor.  Avoid use of street drugs. Do not share needles with anyone. Ask for help if you need support or instructions about stopping the use of drugs.  High blood pressure causes heart disease and increases the risk  of stroke. Your blood pressure should be checked at least every 1 to 2 years. Ongoing high blood pressure should be treated with medicines if weight loss and exercise do not work.  If you are 55-79 years old, ask your health care provider if you should take aspirin to prevent strokes.  Diabetes screening is done by taking a blood sample to check your blood glucose level after you have not eaten for a certain period of time (fasting). If you are not overweight and you do not have risk factors for diabetes, you should be screened once every 3 years starting at age 45. If you are overweight or obese and you are 40-70 years of age, you should be screened for diabetes every year as part of your cardiovascular risk assessment.  Breast cancer screening is essential preventive care for women. You should practice "breast self-awareness." This means understanding the normal appearance and feel of your breasts and may include breast self-examination. Any changes detected, no matter how small, should be reported to a health care provider. Women in their 20s and 30s should have a clinical breast exam (CBE) by a health care provider as part of a regular health exam every 1 to 3 years. After age 40, women should have a CBE every year. Starting at age 40, women should consider having a mammogram (breast X-ray test) every year. Women who have a family history of breast cancer should talk to their health care provider about genetic screening. Women at a high risk of breast cancer should talk to their health care providers about having an MRI and a mammogram every year.  Breast cancer gene (BRCA)-related cancer risk assessment is recommended for women who have family members with BRCA-related cancers. BRCA-related cancers include breast, ovarian, tubal, and peritoneal cancers. Having family members with these cancers may be associated with an increased risk for harmful changes (mutations) in the breast cancer genes BRCA1 and  BRCA2. Results of the assessment will determine the need for genetic counseling and BRCA1 and BRCA2 testing.  Your health care provider may recommend that you be screened regularly for cancer of the pelvic organs (ovaries, uterus, and vagina). This screening involves a pelvic examination, including checking for microscopic changes to the surface of your cervix (Pap test). You may be encouraged to have this screening done every 3 years, beginning at age 21.  For women ages 30-65, health care providers may recommend pelvic exams and Pap testing every 3 years, or they may recommend the Pap and pelvic exam, combined with testing for human papilloma virus (HPV), every 5 years. Some types of HPV increase your risk of cervical cancer. Testing for HPV may also be done on women of any age with unclear Pap test results.  Other health care providers may not recommend any screening for nonpregnant women who are considered low risk for pelvic cancer and who do not have symptoms. Ask your health care provider if a screening pelvic exam is right for   you.  If you have had past treatment for cervical cancer or a condition that could lead to cancer, you need Pap tests and screening for cancer for at least 20 years after your treatment. If Pap tests have been discontinued, your risk factors (such as having a new sexual partner) need to be reassessed to determine if screening should resume. Some women have medical problems that increase the chance of getting cervical cancer. In these cases, your health care provider may recommend more frequent screening and Pap tests.  Colorectal cancer can be detected and often prevented. Most routine colorectal cancer screening begins at the age of 50 years and continues through age 75 years. However, your health care provider may recommend screening at an earlier age if you have risk factors for colon cancer. On a yearly basis, your health care provider may provide home test kits to check  for hidden blood in the stool. Use of a small camera at the end of a tube, to directly examine the colon (sigmoidoscopy or colonoscopy), can detect the earliest forms of colorectal cancer. Talk to your health care provider about this at age 50, when routine screening begins. Direct exam of the colon should be repeated every 5-10 years through age 75 years, unless early forms of precancerous polyps or small growths are found.  People who are at an increased risk for hepatitis B should be screened for this virus. You are considered at high risk for hepatitis B if:  You were born in a country where hepatitis B occurs often. Talk with your health care provider about which countries are considered high risk.  Your parents were born in a high-risk country and you have not received a shot to protect against hepatitis B (hepatitis B vaccine).  You have HIV or AIDS.  You use needles to inject street drugs.  You live with, or have sex with, someone who has hepatitis B.  You get hemodialysis treatment.  You take certain medicines for conditions like cancer, organ transplantation, and autoimmune conditions.  Hepatitis C blood testing is recommended for all people born from 1945 through 1965 and any individual with known risks for hepatitis C.  Practice safe sex. Use condoms and avoid high-risk sexual practices to reduce the spread of sexually transmitted infections (STIs). STIs include gonorrhea, chlamydia, syphilis, trichomonas, herpes, HPV, and human immunodeficiency virus (HIV). Herpes, HIV, and HPV are viral illnesses that have no cure. They can result in disability, cancer, and death.  You should be screened for sexually transmitted illnesses (STIs) including gonorrhea and chlamydia if:  You are sexually active and are younger than 24 years.  You are older than 24 years and your health care provider tells you that you are at risk for this type of infection.  Your sexual activity has changed  since you were last screened and you are at an increased risk for chlamydia or gonorrhea. Ask your health care provider if you are at risk.  If you are at risk of being infected with HIV, it is recommended that you take a prescription medicine daily to prevent HIV infection. This is called preexposure prophylaxis (PrEP). You are considered at risk if:  You are sexually active and do not regularly use condoms or know the HIV status of your partner(s).  You take drugs by injection.  You are sexually active with a partner who has HIV.  Talk with your health care provider about whether you are at high risk of being infected with HIV. If   you choose to begin PrEP, you should first be tested for HIV. You should then be tested every 3 months for as long as you are taking PrEP.  Osteoporosis is a disease in which the bones lose minerals and strength with aging. This can result in serious bone fractures or breaks. The risk of osteoporosis can be identified using a bone density scan. Women ages 67 years and over and women at risk for fractures or osteoporosis should discuss screening with their health care providers. Ask your health care provider whether you should take a calcium supplement or vitamin D to reduce the rate of osteoporosis.  Menopause can be associated with physical symptoms and risks. Hormone replacement therapy is available to decrease symptoms and risks. You should talk to your health care provider about whether hormone replacement therapy is right for you.  Use sunscreen. Apply sunscreen liberally and repeatedly throughout the day. You should seek shade when your shadow is shorter than you. Protect yourself by wearing long sleeves, pants, a wide-brimmed hat, and sunglasses year round, whenever you are outdoors.  Once a month, do a whole body skin exam, using a mirror to look at the skin on your back. Tell your health care provider of new moles, moles that have irregular borders, moles that  are larger than a pencil eraser, or moles that have changed in shape or color.  Stay current with required vaccines (immunizations).  Influenza vaccine. All adults should be immunized every year.  Tetanus, diphtheria, and acellular pertussis (Td, Tdap) vaccine. Pregnant women should receive 1 dose of Tdap vaccine during each pregnancy. The dose should be obtained regardless of the length of time since the last dose. Immunization is preferred during the 27th-36th week of gestation. An adult who has not previously received Tdap or who does not know her vaccine status should receive 1 dose of Tdap. This initial dose should be followed by tetanus and diphtheria toxoids (Td) booster doses every 10 years. Adults with an unknown or incomplete history of completing a 3-dose immunization series with Td-containing vaccines should begin or complete a primary immunization series including a Tdap dose. Adults should receive a Td booster every 10 years.  Varicella vaccine. An adult without evidence of immunity to varicella should receive 2 doses or a second dose if she has previously received 1 dose. Pregnant females who do not have evidence of immunity should receive the first dose after pregnancy. This first dose should be obtained before leaving the health care facility. The second dose should be obtained 4-8 weeks after the first dose.  Human papillomavirus (HPV) vaccine. Females aged 13-26 years who have not received the vaccine previously should obtain the 3-dose series. The vaccine is not recommended for use in pregnant females. However, pregnancy testing is not needed before receiving a dose. If a female is found to be pregnant after receiving a dose, no treatment is needed. In that case, the remaining doses should be delayed until after the pregnancy. Immunization is recommended for any person with an immunocompromised condition through the age of 61 years if she did not get any or all doses earlier. During the  3-dose series, the second dose should be obtained 4-8 weeks after the first dose. The third dose should be obtained 24 weeks after the first dose and 16 weeks after the second dose.  Zoster vaccine. One dose is recommended for adults aged 30 years or older unless certain conditions are present.  Measles, mumps, and rubella (MMR) vaccine. Adults born  before 1957 generally are considered immune to measles and mumps. Adults born in 1957 or later should have 1 or more doses of MMR vaccine unless there is a contraindication to the vaccine or there is laboratory evidence of immunity to each of the three diseases. A routine second dose of MMR vaccine should be obtained at least 28 days after the first dose for students attending postsecondary schools, health care workers, or international travelers. People who received inactivated measles vaccine or an unknown type of measles vaccine during 1963-1967 should receive 2 doses of MMR vaccine. People who received inactivated mumps vaccine or an unknown type of mumps vaccine before 1979 and are at high risk for mumps infection should consider immunization with 2 doses of MMR vaccine. For females of childbearing age, rubella immunity should be determined. If there is no evidence of immunity, females who are not pregnant should be vaccinated. If there is no evidence of immunity, females who are pregnant should delay immunization until after pregnancy. Unvaccinated health care workers born before 1957 who lack laboratory evidence of measles, mumps, or rubella immunity or laboratory confirmation of disease should consider measles and mumps immunization with 2 doses of MMR vaccine or rubella immunization with 1 dose of MMR vaccine.  Pneumococcal 13-valent conjugate (PCV13) vaccine. When indicated, a person who is uncertain of his immunization history and has no record of immunization should receive the PCV13 vaccine. All adults 65 years of age and older should receive this  vaccine. An adult aged 19 years or older who has certain medical conditions and has not been previously immunized should receive 1 dose of PCV13 vaccine. This PCV13 should be followed with a dose of pneumococcal polysaccharide (PPSV23) vaccine. Adults who are at high risk for pneumococcal disease should obtain the PPSV23 vaccine at least 8 weeks after the dose of PCV13 vaccine. Adults older than 58 years of age who have normal immune system function should obtain the PPSV23 vaccine dose at least 1 year after the dose of PCV13 vaccine.  Pneumococcal polysaccharide (PPSV23) vaccine. When PCV13 is also indicated, PCV13 should be obtained first. All adults aged 65 years and older should be immunized. An adult younger than age 65 years who has certain medical conditions should be immunized. Any person who resides in a nursing home or long-term care facility should be immunized. An adult smoker should be immunized. People with an immunocompromised condition and certain other conditions should receive both PCV13 and PPSV23 vaccines. People with human immunodeficiency virus (HIV) infection should be immunized as soon as possible after diagnosis. Immunization during chemotherapy or radiation therapy should be avoided. Routine use of PPSV23 vaccine is not recommended for American Indians, [place] Natives, or people younger than 65 years unless there are medical conditions that require PPSV23 vaccine. When indicated, people who have unknown immunization and have no record of immunization should receive PPSV23 vaccine. One-time revaccination 5 years after the first dose of PPSV23 is recommended for people aged 19-64 years who have chronic kidney failure, nephrotic syndrome, asplenia, or immunocompromised conditions. People who received 1-2 doses of PPSV23 before age 65 years should receive another dose of PPSV23 vaccine at age 65 years or later if at least 5 years have passed since the previous dose. Doses of PPSV23 are not  needed for people immunized with PPSV23 at or after age 65 years.  Meningococcal vaccine. Adults with asplenia or persistent complement component deficiencies should receive 2 doses of quadrivalent meningococcal conjugate (MenACWY-D) vaccine. The doses should be obtained   at least 2 months apart. Microbiologists working with certain meningococcal bacteria, [place] recruits, people at risk during an outbreak, and people who travel to or live in countries with a high rate of meningitis should be immunized. A first-year college student up through age 34 years who is living in a residence hall should receive a dose if she did not receive a dose on or after her 16th birthday. Adults who have certain high-risk conditions should receive one or more doses of vaccine.  Hepatitis A vaccine. Adults who wish to be protected from this disease, have certain high-risk conditions, work with hepatitis A-infected animals, work in hepatitis A research labs, or travel to or work in countries with a high rate of hepatitis A should be immunized. Adults who were previously unvaccinated and who anticipate close contact with an international adoptee during the first 60 days after arrival in the [place] from a country with a high rate of hepatitis A should be immunized.  Hepatitis B vaccine. Adults who wish to be protected from this disease, have certain high-risk conditions, may be exposed to blood or other infectious body fluids, are household contacts or sex partners of hepatitis B positive people, are clients or workers in certain care facilities, or travel to or work in countries with a high rate of hepatitis B should be immunized.  Haemophilus influenzae type b (Hib) vaccine. A previously unvaccinated person with asplenia or sickle cell disease or having a scheduled splenectomy should receive 1 dose of Hib vaccine. Regardless of previous immunization, a recipient of a hematopoietic stem cell transplant should receive a  3-dose series 6-12 months after her successful transplant. Hib vaccine is not recommended for adults with HIV infection. Preventive Services / Frequency Ages 35 to 4 years  Blood pressure check.** / Every 3-5 years.  Lipid and cholesterol check.** / Every 5 years beginning at age 60.  Clinical breast exam.** / Every 3 years for women in their 71s and 10s.  BRCA-related cancer risk assessment.** / For women who have family members with a BRCA-related cancer (breast, ovarian, tubal, or peritoneal cancers).  Pap test.** / Every 2 years from ages 76 through 26. Every 3 years starting at age 61 through age 76 or 93 with a history of 3 consecutive normal Pap tests.  HPV screening.** / Every 3 years from ages 37 through ages 60 to 51 with a history of 3 consecutive normal Pap tests.  Hepatitis C blood test.** / For any individual with known risks for hepatitis C.  Skin self-exam. / Monthly.  Influenza vaccine. / Every year.  Tetanus, diphtheria, and acellular pertussis (Tdap, Td) vaccine.** / Consult your health care provider. Pregnant women should receive 1 dose of Tdap vaccine during each pregnancy. 1 dose of Td every 10 years.  Varicella vaccine.** / Consult your health care provider. Pregnant females who do not have evidence of immunity should receive the first dose after pregnancy.  HPV vaccine. / 3 doses over 6 months, if 93 and younger. The vaccine is not recommended for use in pregnant females. However, pregnancy testing is not needed before receiving a dose.  Measles, mumps, rubella (MMR) vaccine.** / You need at least 1 dose of MMR if you were born in 1957 or later. You may also need a 2nd dose. For females of childbearing age, rubella immunity should be determined. If there is no evidence of immunity, females who are not pregnant should be vaccinated. If there is no evidence of immunity, females who are  pregnant should delay immunization until after pregnancy.  Pneumococcal  13-valent conjugate (PCV13) vaccine.** / Consult your health care provider.  Pneumococcal polysaccharide (PPSV23) vaccine.** / 1 to 2 doses if you smoke cigarettes or if you have certain conditions.  Meningococcal vaccine.** / 1 dose if you are age 68 to 8 years and a Market researcher living in a residence hall, or have one of several medical conditions, you need to get vaccinated against meningococcal disease. You may also need additional booster doses.  Hepatitis A vaccine.** / Consult your health care provider.  Hepatitis B vaccine.** / Consult your health care provider.  Haemophilus influenzae type b (Hib) vaccine.** / Consult your health care provider. Ages 7 to 53 years  Blood pressure check.** / Every year.  Lipid and cholesterol check.** / Every 5 years beginning at age 25 years.  Lung cancer screening. / Every year if you are aged 11-80 years and have a 30-pack-year history of smoking and currently smoke or have quit within the past 15 years. Yearly screening is stopped once you have quit smoking for at least 15 years or develop a health problem that would prevent you from having lung cancer treatment.  Clinical breast exam.** / Every year after age 48 years.  BRCA-related cancer risk assessment.** / For women who have family members with a BRCA-related cancer (breast, ovarian, tubal, or peritoneal cancers).  Mammogram.** / Every year beginning at age 41 years and continuing for as long as you are in good health. Consult with your health care provider.  Pap test.** / Every 3 years starting at age 65 years through age 37 or 70 years with a history of 3 consecutive normal Pap tests.  HPV screening.** / Every 3 years from ages 72 years through ages 60 to 40 years with a history of 3 consecutive normal Pap tests.  Fecal occult blood test (FOBT) of stool. / Every year beginning at age 21 years and continuing until age 5 years. You may not need to do this test if you get  a colonoscopy every 10 years.  Flexible sigmoidoscopy or colonoscopy.** / Every 5 years for a flexible sigmoidoscopy or every 10 years for a colonoscopy beginning at age 35 years and continuing until age 48 years.  Hepatitis C blood test.** / For all people born from [birth year] through 1965 and any individual with known risks for hepatitis C.  Skin self-exam. / Monthly.  Influenza vaccine. / Every year.  Tetanus, diphtheria, and acellular pertussis (Tdap/Td) vaccine.** / Consult your health care provider. Pregnant women should receive 1 dose of Tdap vaccine during each pregnancy. 1 dose of Td every 10 years.  Varicella vaccine.** / Consult your health care provider. Pregnant females who do not have evidence of immunity should receive the first dose after pregnancy.  Zoster vaccine.** / 1 dose for adults aged 30 years or older.  Measles, mumps, rubella (MMR) vaccine.** / You need at least 1 dose of MMR if you were born in 1957 or later. You may also need a second dose. For females of childbearing age, rubella immunity should be determined. If there is no evidence of immunity, females who are not pregnant should be vaccinated. If there is no evidence of immunity, females who are pregnant should delay immunization until after pregnancy.  Pneumococcal 13-valent conjugate (PCV13) vaccine.** / Consult your health care provider.  Pneumococcal polysaccharide (PPSV23) vaccine.** / 1 to 2 doses if you smoke cigarettes or if you have certain conditions.  Meningococcal vaccine.** /  Consult your health care provider.  Hepatitis A vaccine.** / Consult your health care provider.  Hepatitis B vaccine.** / Consult your health care provider.  Haemophilus influenzae type b (Hib) vaccine.** / Consult your health care provider. Ages 64 years and over  Blood pressure check.** / Every year.  Lipid and cholesterol check.** / Every 5 years beginning at age 23 years.  Lung cancer screening. / Every year if you  are aged 16-80 years and have a 30-pack-year history of smoking and currently smoke or have quit within the past 15 years. Yearly screening is stopped once you have quit smoking for at least 15 years or develop a health problem that would prevent you from having lung cancer treatment.  Clinical breast exam.** / Every year after age 74 years.  BRCA-related cancer risk assessment.** / For women who have family members with a BRCA-related cancer (breast, ovarian, tubal, or peritoneal cancers).  Mammogram.** / Every year beginning at age 44 years and continuing for as long as you are in good health. Consult with your health care provider.  Pap test.** / Every 3 years starting at age 58 years through age 22 or 39 years with 3 consecutive normal Pap tests. Testing can be stopped between 65 and 70 years with 3 consecutive normal Pap tests and no abnormal Pap or HPV tests in the past 10 years.  HPV screening.** / Every 3 years from ages 64 years through ages 70 or 61 years with a history of 3 consecutive normal Pap tests. Testing can be stopped between 65 and 70 years with 3 consecutive normal Pap tests and no abnormal Pap or HPV tests in the past 10 years.  Fecal occult blood test (FOBT) of stool. / Every year beginning at age 40 years and continuing until age 27 years. You may not need to do this test if you get a colonoscopy every 10 years.  Flexible sigmoidoscopy or colonoscopy.** / Every 5 years for a flexible sigmoidoscopy or every 10 years for a colonoscopy beginning at age 7 years and continuing until age 32 years.  Hepatitis C blood test.** / For all people born from [birth year] through 1963 and any individual with known risks for hepatitis C.  Osteoporosis screening.** / A one-time screening for women ages 30 years and over and women at risk for fractures or osteoporosis.  Skin self-exam. / Monthly.  Influenza vaccine. / Every year.  Tetanus, diphtheria, and acellular pertussis (Tdap/Td)  vaccine.** / 1 dose of Td every 10 years.  Varicella vaccine.** / Consult your health care provider.  Zoster vaccine.** / 1 dose for adults aged 35 years or older.  Pneumococcal 13-valent conjugate (PCV13) vaccine.** / Consult your health care provider.  Pneumococcal polysaccharide (PPSV23) vaccine.** / 1 dose for all adults aged 46 years and older.  Meningococcal vaccine.** / Consult your health care provider.  Hepatitis A vaccine.** / Consult your health care provider.  Hepatitis B vaccine.** / Consult your health care provider.  Haemophilus influenzae type b (Hib) vaccine.** / Consult your health care provider. ** Family history and personal history of risk and conditions may change your health care provider's recommendations.   This information is not intended to replace advice given to you by your health care provider. Make sure you discuss any questions you have with your health care provider.   Document Released: 03/04/2001 Document Revised: 01/27/2014 Document Reviewed: 06/03/2010 Elsevier Interactive Patient Education [employer].

## 2015-10-04 NOTE — Progress Notes (Signed)
Pre visit review using our clinic review tool, if applicable. No additional management support is needed unless otherwise documented below in the visit note. 

## 2015-10-31 ENCOUNTER — Telehealth: Payer: Self-pay

## 2015-10-31 NOTE — Telephone Encounter (Signed)
If she would consider filling at a local pharmacy, you can check with pharmacy downstairs as they have had in stock.  If she still wants everything from mail order, please assess if she has taken other beta blockers before so we now what options we have.

## 2015-10-31 NOTE — Telephone Encounter (Signed)
Refill request for BP med's Atenolol 25 1 daily, is no longer avail and the pharmacy is asking for an alternative. Please advise    KP

## 2015-11-02 ENCOUNTER — Encounter: Payer: Self-pay | Admitting: Family Medicine

## 2015-11-02 MED ORDER — METOPROLOL SUCCINATE ER 25 MG PO TB24: 25.0000 mg | ORAL_TABLET | Freq: Every day | ORAL | 0 refills | Status: DC

## 2015-11-02 NOTE — Telephone Encounter (Signed)
Rx faxed, I tried calling the patient to make her aware. Message left.   KP

## 2015-11-02 NOTE — Telephone Encounter (Signed)
toprol xl 25 mg #30  1 po qd, 2 refill s 

## 2015-11-02 NOTE — Telephone Encounter (Signed)
Atenolol is no longer available, please advise on an alternative.   KP

## 2015-11-03 ENCOUNTER — Encounter: Payer: Self-pay | Admitting: Family Medicine

## 2015-11-05 MED ORDER — FENOFIBRATE 160 MG PO TABS: 160.0000 mg | ORAL_TABLET | Freq: Every day | ORAL | 1 refills | Status: DC

## 2015-11-05 MED ORDER — ATORVASTATIN CALCIUM 20 MG PO TABS: 20.0000 mg | ORAL_TABLET | Freq: Every day | ORAL | 1 refills | Status: DC

## 2015-11-30 ENCOUNTER — Encounter: Payer: Self-pay | Admitting: Endocrinology

## 2015-11-30 ENCOUNTER — Ambulatory Visit (INDEPENDENT_AMBULATORY_CARE_PROVIDER_SITE_OTHER): Payer: Managed Care, Other (non HMO) | Admitting: Endocrinology

## 2015-11-30 VITALS — BP 130/79 | HR 64 | Ht 61.0 in | Wt 159.0 lb

## 2015-11-30 DIAGNOSIS — E118 Type 2 diabetes mellitus with unspecified complications: Secondary | ICD-10-CM

## 2015-11-30 LAB — POCT GLYCOSYLATED HEMOGLOBIN (HGB A1C): HEMOGLOBIN A1C: 5.7

## 2015-11-30 NOTE — Progress Notes (Signed)
Subjective:    Patient ID: Nicole Richard, female    DOB: 1955-04-05, 60 y.o.   MRN: [id number]  HPI Pt returns for f/u of diabetes mellitus: DM type: 2 Dx'ed: 123456 Complications: none Therapy: 4 oral meds GDM: never DKA: never Severe hypoglycemia: never Pancreatitis: never Other: she has never taken insulin Interval history: no cbg record, but states cbg's are well-controlled. pt states she feels well in general.   Past Medical History:  Diagnosis Date  . Allergy    seasonal  . Asthma   . GERD (gastroesophageal reflux disease)   . Hyperlipidemia   . Hypertension     Past Surgical History:  Procedure Laterality Date  . ABDOMINAL HYSTERECTOMY  08/2004  . APPENDECTOMY    . BREAST ENHANCEMENT SURGERY  2001  . COLONOSCOPY    . POLYPECTOMY  2009   sigmoid polyps  . SHOULDER ARTHROSCOPY W/ ROTATOR CUFF REPAIR Right 06/06/2015   [facility]  . TONSILLECTOMY    . TUBAL LIGATION      Social History   Social History  . Marital status: Divorced    Spouse name: N/A  . Number of children: 2  . Years of education: N/A   Occupational History  . [employer]--      Social History Main Topics  . Smoking status: Never Smoker  . Smokeless tobacco: Never Used  . Alcohol use Yes     Comment: rare  . Drug use: No  . Sexual activity: Not Currently    Partners: Male   Other Topics Concern  . Not on file   Social History Narrative   Exercise--walking , and pt daily    Current Outpatient Prescriptions on File Prior to Visit  Medication Sig Dispense Refill  . aspirin 81 MG tablet Take 81 mg by mouth daily.    [name] atorvastatin (LIPITOR) 20 MG tablet Take 1 tablet (20 mg total) by mouth daily. 90 tablet 1  . bromocriptine (PARLODEL) 2.5 MG tablet 1/4 tab daily 24 tablet 3  . Calcium Citrate-Vitamin D (CALCIUM + D PO) Take 600-1,000 mg by mouth once.     . canagliflozin (INVOKANA) 300 MG TABS tablet Take 1 tablet (300 mg total) by mouth daily. 90 tablet 3  . Cholecalciferol  (VITAMIN D PO) Take 600 Units by mouth daily.      . Cranberry 250 MG TABS Take 2 tablets by mouth daily.    [name] esomeprazole (NEXIUM) 40 MG capsule Take 1 capsule (40 mg total) by mouth daily. 90 capsule 3  . fenofibrate 160 MG tablet Take 1 tablet (160 mg total) by mouth daily. 90 tablet 1  . Flaxseed, Linseed, (FLAXSEED OIL) 1200 MG CAPS Take 1 capsule by mouth daily.    [name] glucose blood (ONE TOUCH ULTRA TEST) test strip One touch ultra blue test strips. Check blood sugar twice daily E.119 100 each 12  . Ipratropium-Albuterol (COMBIVENT RESPIMAT) 20-100 MCG/ACT AERS respimat Inhale 1 puff into the lungs 4 (four) times daily. 1 Inhaler 3  . metoprolol succinate (TOPROL XL) 25 MG 24 hr tablet Take 1 tablet (25 mg total) by mouth daily. 90 tablet 0  . mometasone (NASONEX) 50 MCG/ACT nasal spray Place 2 sprays into the nose daily. 17 g 2  . montelukast (SINGULAIR) 10 MG tablet TAKE 1 TABLET DAILY 90 tablet 3  . Multiple Vitamin (MULTIVITAMIN WITH MINERALS) TABS tablet Take 1 tablet by mouth daily.    [name] DELICA LANCETS 99991111 MISC Check Blood sugar twice daily. Dx:E11.9  100 each 12  . Propylene Glycol (SYSTANE BALANCE) 0.6 % SOLN Apply 1 drop to eye as needed.    . Saxagliptin-Metformin (KOMBIGLYZE XR) 2.05-998 MG TB24 Take 2 tablets by mouth every morning. 180 tablet 3  . atenolol (TENORMIN) 25 MG tablet Take 1 tablet by mouth daily-- (Patient not taking: Reported on 11/30/2015) 90 tablet 3   No current facility-administered medications on file prior to visit.     Allergies  Allergen Reactions  . Contrast Media [Iodinated Diagnostic Agents] Anaphylaxis  . Metamucil [Psyllium] Anaphylaxis  . Mustard Seed Anaphylaxis    Family History  Problem Relation Age of Onset  . Rheumatic fever Mother   . Irritable bowel syndrome Mother   . Diabetes Father   . Colon polyps Father   . Kidney disease Father   . Coronary artery disease    . Hyperlipidemia    . Hypertension    . Asthma    .  Cervical cancer Maternal Grandmother   . Breast cancer Maternal Grandmother   . Colon cancer Maternal Grandmother   . Uterine cancer Maternal Grandmother   . Esophageal cancer Neg Hx   . Rectal cancer Neg Hx   . Stomach cancer Neg Hx     BP 130/79   Pulse 64   Ht 5\' 1"  (1.549 m)   Wt 159 lb (72.1 kg)   BMI 30.04 kg/m    Review of Systems She denies hypoglycemia.      Objective:   Physical Exam VITAL SIGNS:  See vs page GENERAL: no distress Pulses: dorsalis pedis intact bilat.   MSK: no deformity of the feet CV: no leg edema Skin:  no ulcer on the feet.  normal color and temp on the feet. Neuro: sensation is intact to touch on the feet Ext: There is bilateral onychomycosis of the toenails  Lab Results  Component Value Date   HGBA1C 5.7 11/30/2015      Assessment & Plan:  Type 2 DM: well-controlled  Patient is advised the following: Patient Instructions  check your blood sugar once a day.  vary the time of day when you check, between before the 3 meals, and at bedtime.  also check if you have symptoms of your blood sugar being too high or too low.  please keep a record of the readings and bring it to your next appointment here.  You can write it on any piece of paper.  please call us sooner if your blood sugar goes below 70, or if you have a lot of readings over 200.  Please continue the same medications.  Please come back for a follow-up appointment in 6 months.

## 2015-11-30 NOTE — Patient Instructions (Addendum)
check your blood sugar once a day.  vary the time of day when you check, between before the 3 meals, and at bedtime.  also check if you have symptoms of your blood sugar being too high or too low.  please keep a record of the readings and bring it to your next appointment here.  You can write it on any piece of paper.  please call us sooner if your blood sugar goes below 70, or if you have a lot of readings over 200.  Please continue the same medications.  Please come back for a follow-up appointment in 6 months.    

## 2015-12-04 ENCOUNTER — Other Ambulatory Visit: Payer: Self-pay

## 2015-12-04 DIAGNOSIS — J302 Other seasonal allergic rhinitis: Secondary | ICD-10-CM

## 2015-12-04 DIAGNOSIS — J45901 Unspecified asthma with (acute) exacerbation: Secondary | ICD-10-CM

## 2015-12-04 MED ORDER — IPRATROPIUM-ALBUTEROL 20-100 MCG/ACT IN AERS: 1.0000 | INHALATION_SPRAY | Freq: Four times a day (QID) | RESPIRATORY_TRACT | 3 refills | Status: DC

## 2015-12-04 MED ORDER — MOMETASONE FUROATE 50 MCG/ACT NA SUSP: 2.0000 | Freq: Every day | NASAL | 2 refills | Status: DC

## 2015-12-12 ENCOUNTER — Other Ambulatory Visit: Payer: Self-pay

## 2016-02-11 LAB — HM DIABETES EYE EXAM

## 2016-04-04 ENCOUNTER — Ambulatory Visit (INDEPENDENT_AMBULATORY_CARE_PROVIDER_SITE_OTHER): Payer: PRIVATE HEALTH INSURANCE | Admitting: Family Medicine

## 2016-04-04 ENCOUNTER — Encounter: Payer: Self-pay | Admitting: Family Medicine

## 2016-04-04 DIAGNOSIS — E785 Hyperlipidemia, unspecified: Secondary | ICD-10-CM | POA: Diagnosis not present

## 2016-04-04 DIAGNOSIS — E1151 Type 2 diabetes mellitus with diabetic peripheral angiopathy without gangrene: Secondary | ICD-10-CM

## 2016-04-04 DIAGNOSIS — I10 Essential (primary) hypertension: Secondary | ICD-10-CM | POA: Diagnosis not present

## 2016-04-04 DIAGNOSIS — IMO0002 Reserved for concepts with insufficient information to code with codable children: Secondary | ICD-10-CM

## 2016-04-04 DIAGNOSIS — K219 Gastro-esophageal reflux disease without esophagitis: Secondary | ICD-10-CM | POA: Diagnosis not present

## 2016-04-04 DIAGNOSIS — E1165 Type 2 diabetes mellitus with hyperglycemia: Secondary | ICD-10-CM | POA: Diagnosis not present

## 2016-04-04 LAB — LIPID PANEL
CHOL/HDL RATIO: 3
Cholesterol: 194 mg/dL (ref 0–200)
HDL: 61.3 mg/dL (ref >39.00)
LDL CALC: 105 mg/dL — AB (ref 0–99)
NONHDL: 132.62
Triglycerides: 138 mg/dL (ref 0.0–149.0)
VLDL: 27.6 mg/dL (ref 0.0–40.0)

## 2016-04-04 LAB — COMPREHENSIVE METABOLIC PANEL
ALT: 35 U/L (ref 0–35)
AST: 31 U/L (ref 0–37)
Albumin: 4.4 g/dL (ref 3.5–5.2)
Alkaline Phosphatase: 80 U/L (ref 39–117)
BUN: 23 mg/dL (ref 6–23)
CHLORIDE: 104 meq/L (ref 96–112)
CO2: 28 meq/L (ref 19–32)
Calcium: 10.1 mg/dL (ref 8.4–10.5)
Creatinine, Ser: 0.75 mg/dL (ref 0.40–1.20)
GFR: 84.15 mL/min (ref >60.00)
GLUCOSE: 108 mg/dL — AB (ref 70–99)
POTASSIUM: 4.5 meq/L (ref 3.5–5.1)
SODIUM: 137 meq/L (ref 135–145)
TOTAL PROTEIN: 7.2 g/dL (ref 6.0–8.3)
Total Bilirubin: 0.6 mg/dL (ref 0.2–1.2)

## 2016-04-04 MED ORDER — ATORVASTATIN CALCIUM 20 MG PO TABS: 20.0000 mg | ORAL_TABLET | Freq: Every day | ORAL | 0 refills | Status: DC

## 2016-04-04 MED ORDER — MONTELUKAST SODIUM 10 MG PO TABS: ORAL_TABLET | ORAL | 1 refills | Status: DC

## 2016-04-04 MED ORDER — SAXAGLIPTIN-METFORMIN ER 2.5-1000 MG PO TB24: 2.0000 | ORAL_TABLET | ORAL | 1 refills | Status: DC

## 2016-04-04 MED ORDER — CANAGLIFLOZIN 300 MG PO TABS: 300.0000 mg | ORAL_TABLET | Freq: Every day | ORAL | 1 refills | Status: DC

## 2016-04-04 MED ORDER — FENOFIBRATE 160 MG PO TABS: 160.0000 mg | ORAL_TABLET | Freq: Every day | ORAL | 1 refills | Status: DC

## 2016-04-04 MED ORDER — FENOFIBRATE 160 MG PO TABS: 160.0000 mg | ORAL_TABLET | Freq: Every day | ORAL | 0 refills | Status: DC

## 2016-04-04 MED ORDER — ATENOLOL 25 MG PO TABS
ORAL_TABLET | ORAL | 1 refills | Status: DC
Start: 2016-04-04 — End: 2016-04-04

## 2016-04-04 MED ORDER — MONTELUKAST SODIUM 10 MG PO TABS: ORAL_TABLET | ORAL | 0 refills | Status: DC

## 2016-04-04 MED ORDER — SAXAGLIPTIN-METFORMIN ER 2.5-1000 MG PO TB24: 2.0000 | ORAL_TABLET | ORAL | 0 refills | Status: DC

## 2016-04-04 MED ORDER — CANAGLIFLOZIN 300 MG PO TABS: 300.0000 mg | ORAL_TABLET | Freq: Every day | ORAL | 0 refills | Status: DC

## 2016-04-04 MED ORDER — ESOMEPRAZOLE MAGNESIUM 40 MG PO CPDR: 40.0000 mg | DELAYED_RELEASE_CAPSULE | Freq: Two times a day (BID) | ORAL | 1 refills | Status: DC

## 2016-04-04 MED ORDER — ATORVASTATIN CALCIUM 20 MG PO TABS: 20.0000 mg | ORAL_TABLET | Freq: Every day | ORAL | 1 refills | Status: DC

## 2016-04-04 MED ORDER — ATENOLOL 25 MG PO TABS: ORAL_TABLET | ORAL | 0 refills | Status: DC

## 2016-04-04 MED ORDER — ESOMEPRAZOLE MAGNESIUM 40 MG PO CPDR: 40.0000 mg | DELAYED_RELEASE_CAPSULE | Freq: Two times a day (BID) | ORAL | 0 refills | Status: DC

## 2016-04-04 NOTE — Assessment & Plan Note (Signed)
Tolerating statin, encouraged heart healthy diet, avoid trans fats, minimize simple carbs and saturated fats. Increase exercise as tolerated 

## 2016-04-04 NOTE — Patient Instructions (Signed)
Carbohydrate Counting for Diabetes Mellitus, Adult Carbohydrate counting is a method for keeping track of how many carbohydrates you eat. Eating carbohydrates naturally increases the amount of sugar (glucose) in the blood. Counting how many carbohydrates you eat helps keep your blood glucose within normal limits, which helps you manage your diabetes (diabetes mellitus). It is important to know how many carbohydrates you can safely have in each meal. This is different for every person. A diet and nutrition specialist (registered dietitian) can help you make a meal plan and calculate how many carbohydrates you should have at each meal and snack. Carbohydrates are found in the following foods:  Grains, such as breads and cereals.  Dried beans and soy products.  Starchy vegetables, such as potatoes, peas, and corn.  Fruit and fruit juices.  Milk and yogurt.  Sweets and snack foods, such as cake, cookies, candy, chips, and soft drinks. How do I count carbohydrates? There are two ways to count carbohydrates in food. You can use either of the methods or a combination of both. Reading "Nutrition Facts" on packaged food  The "Nutrition Facts" list is included on the labels of almost all packaged foods and beverages in the [place] It includes:  The serving size.  Information about nutrients in each serving, including the grams (g) of carbohydrate per serving. To use the "Nutrition Facts":  Decide how many servings you will have.  Multiply the number of servings by the number of carbohydrates per serving.  The resulting number is the total amount of carbohydrates that you will be having. Learning standard serving sizes of other foods  When you eat foods containing carbohydrates that are not packaged or do not include "Nutrition Facts" on the label, you need to measure the servings in order to count the amount of carbohydrates:  Measure the foods that you will eat with a food scale or measuring  cup, if needed.  Decide how many standard-size servings you will eat.  Multiply the number of servings by 15. Most carbohydrate-rich foods have about 15 g of carbohydrates per serving.  For example, if you eat 8 oz (170 g) of strawberries, you will have eaten 2 servings and 30 g of carbohydrates (2 servings x 15 g = 30 g).  For foods that have more than one food mixed, such as soups and casseroles, you must count the carbohydrates in each food that is included. The following list contains standard serving sizes of common carbohydrate-rich foods. Each of these servings has about 15 g of carbohydrates:   hamburger bun or  English muffin.   oz (15 mL) syrup.   oz (14 g) jelly.  1 slice of bread.  1 six-inch tortilla.  3 oz (85 g) cooked rice or pasta.  4 oz (113 g) cooked dried beans.  4 oz (113 g) starchy vegetable, such as peas, corn, or potatoes.  4 oz (113 g) hot cereal.  4 oz (113 g) mashed potatoes or  of a large baked potato.  4 oz (113 g) canned or frozen fruit.  4 oz (120 mL) fruit juice.  4-6 crackers.  6 chicken nuggets.  6 oz (170 g) unsweetened dry cereal.  6 oz (170 g) plain fat-free yogurt or yogurt sweetened with artificial sweeteners.  8 oz (240 mL) milk.  8 oz (170 g) fresh fruit or one small piece of fruit.  24 oz (680 g) popped popcorn. Example of carbohydrate counting Sample meal  3 oz (85 g) chicken breast.  6 oz (  170 g) brown rice.  4 oz (113 g) corn.  8 oz (240 mL) milk.  8 oz (170 g) strawberries with sugar-free whipped topping. Carbohydrate calculation 1. Identify the foods that contain carbohydrates:  Rice.  Corn.  Milk.  Strawberries. 2. Calculate how many servings you have of each food:  2 servings rice.  1 serving corn.  1 serving milk.  1 serving strawberries. 3. Multiply each number of servings by 15 g:  2 servings rice x 15 g = 30 g.  1 serving corn x 15 g = 15 g.  1 serving milk x 15 g = 15  g.  1 serving strawberries x 15 g = 15 g. 4. Add together all of the amounts to find the total grams of carbohydrates eaten:  30 g + 15 g + 15 g + 15 g = 75 g of carbohydrates total. This information is not intended to replace advice given to you by your health care provider. Make sure you discuss any questions you have with your health care provider. Document Released: 01/06/2005 Document Revised: 07/27/2015 Document Reviewed: 06/20/2015 Elsevier Interactive Patient Education  2017 [employer]  

## 2016-04-04 NOTE — Assessment & Plan Note (Signed)
Well controlled, no changes to meds. Encouraged heart healthy diet such as the DASH diet and exercise as tolerated.  °

## 2016-04-04 NOTE — Assessment & Plan Note (Addendum)
hgba1c acceptable, minimize simple carbs. Increase exercise as tolerated. Continue current meds Glucose range 90-130  No complaints--- no symptomatic hypoglycemia Pt sees endo con't meds--she is almost out and does not see endo until April  Will refill meds

## 2016-04-04 NOTE — Progress Notes (Signed)
Patient ID: Nicole Richard, female   DOB: [birth year], 61 y.o.   MRN: [id number]     Subjective:  I acted as a Education administrator for Dr. Carollee Herter.  [name], [place]   Patient ID: Nicole Richard, female    DOB: November 01, 1955, 61 y.o.   MRN: [id number]     HPI  Patient is in today for follow up blood pressure and cholesterol  Patient Care Team: [name], DO as PCP - General Renato Shin, MD as Consulting Physician (Endocrinology) Inda Castle, MD as Consulting Physician (Gastroenterology)   Past Medical History:  Diagnosis Date  . Allergy    seasonal  . Asthma   . GERD (gastroesophageal reflux disease)   . Hyperlipidemia   . Hypertension     Past Surgical History:  Procedure Laterality Date  . ABDOMINAL HYSTERECTOMY  08/2004  . APPENDECTOMY    . BREAST ENHANCEMENT SURGERY  2001  . COLONOSCOPY    . POLYPECTOMY  2009   sigmoid polyps  . SHOULDER ARTHROSCOPY W/ ROTATOR CUFF REPAIR Right 06/06/2015   [facility]  . TONSILLECTOMY    . TUBAL LIGATION      Family History  Problem Relation Age of Onset  . Rheumatic fever Mother   . Irritable bowel syndrome Mother   . Diabetes Father   . Colon polyps Father   . Kidney disease Father   . Coronary artery disease    . Hyperlipidemia    . Hypertension    . Asthma    . Cervical cancer Maternal Grandmother   . Breast cancer Maternal Grandmother   . Colon cancer Maternal Grandmother   . Uterine cancer Maternal Grandmother   . Esophageal cancer Neg Hx   . Rectal cancer Neg Hx   . Stomach cancer Neg Hx     Social History   Social History  . Marital status: Divorced    Spouse name: N/A  . Number of children: 2  . Years of education: N/A   Occupational History  . [employer]     Social History Main Topics  . Smoking status: Never Smoker  . Smokeless tobacco: Never Used  . Alcohol use Yes     Comment: rare  . Drug use: No  . Sexual activity: Not Currently    Partners: Male   Other Topics Concern  . Not on  file   Social History Narrative   Exercise--walking , and pt daily    Outpatient Medications Prior to Visit  Medication Sig Dispense Refill  . aspirin 81 MG tablet Take 81 mg by mouth daily.    . bromocriptine (PARLODEL) 2.5 MG tablet 1/4 tab daily 24 tablet 3  . Calcium Citrate-Vitamin D (CALCIUM + D PO) Take 600-1,000 mg by mouth once.     . Cranberry 250 MG TABS Take 2 tablets by mouth daily.    . Flaxseed, Linseed, (FLAXSEED OIL) 1200 MG CAPS Take 1 capsule by mouth daily.    [name] glucose blood (ONE TOUCH ULTRA TEST) test strip One touch ultra blue test strips. Check blood sugar twice daily E.119 (Patient taking differently: One touch ultra blue test strips. Check blood sugar once daily E.119) 100 each 12  . Ipratropium-Albuterol (COMBIVENT RESPIMAT) 20-100 MCG/ACT AERS respimat Inhale 1 puff into the lungs 4 (four) times daily. (Patient taking differently: Inhale 1 puff into the lungs 4 (four) times daily as needed. ) 1 Inhaler 3  . mometasone (NASONEX) 50 MCG/ACT nasal spray Place 2 sprays into the nose  daily. (Patient taking differently: Place 2 sprays into the nose daily as needed. ) 17 g 2  . Multiple Vitamin (MULTIVITAMIN WITH MINERALS) TABS tablet Take 1 tablet by mouth daily.    [name] DELICA LANCETS 02V MISC Check Blood sugar twice daily. Dx:E11.9 (Patient taking differently: Check Blood sugar once daily. Dx:E11.9) 100 each 12  . Propylene Glycol (SYSTANE BALANCE) 0.6 % SOLN Apply 1 drop to eye as needed.    [name] atenolol (TENORMIN) 25 MG tablet Take 1 tablet by mouth daily-- 90 tablet 3  . atorvastatin (LIPITOR) 20 MG tablet Take 1 tablet (20 mg total) by mouth daily. 90 tablet 1  . canagliflozin (INVOKANA) 300 MG TABS tablet Take 1 tablet (300 mg total) by mouth daily. 90 tablet 3  . esomeprazole (NEXIUM) 40 MG capsule Take 1 capsule (40 mg total) by mouth daily. 90 capsule 3  . fenofibrate 160 MG tablet Take 1 tablet (160 mg total) by mouth daily. 90 tablet 1  . montelukast  (SINGULAIR) 10 MG tablet TAKE 1 TABLET DAILY 90 tablet 3  . Saxagliptin-Metformin (KOMBIGLYZE XR) 2.05-998 MG TB24 Take 2 tablets by mouth every morning. 180 tablet 3  . Cholecalciferol (VITAMIN D PO) Take 600 Units by mouth daily.      . metoprolol succinate (TOPROL XL) 25 MG 24 hr tablet Take 1 tablet (25 mg total) by mouth daily. 90 tablet 0   No facility-administered medications prior to visit.     Allergies  Allergen Reactions  . Contrast Media [Iodinated Diagnostic Agents] Anaphylaxis  . Metamucil [Psyllium] Anaphylaxis  . Mustard Seed Anaphylaxis    Review of Systems  Constitutional: Negative for fever and malaise/fatigue.  HENT: Negative for congestion.   Eyes: Negative for blurred vision.  Respiratory: Negative for cough and shortness of breath.   Cardiovascular: Negative for chest pain, palpitations and leg swelling.  Gastrointestinal: Negative for vomiting.  Musculoskeletal: Negative for back pain.  Skin: Negative for rash.  Neurological: Negative for loss of consciousness and headaches.       Objective:    Physical Exam  Constitutional: She is oriented to person, place, and time. She appears well-developed and well-nourished. No distress.  HENT:  Head: Normocephalic and atraumatic.  Right Ear: External ear normal.  Left Ear: External ear normal.  Nose: Nose normal.  Mouth/Throat: Oropharynx is clear and moist.  Eyes: Conjunctivae and EOM are normal. Pupils are equal, round, and reactive to light.  Neck: Normal range of motion. Neck supple. No thyromegaly present.  Cardiovascular: Normal rate, regular rhythm and normal heart sounds.   No murmur heard. Pulmonary/Chest: Effort normal and breath sounds normal. No respiratory distress. She has no wheezes. She has no rales. She exhibits no tenderness.  Abdominal: Soft. Bowel sounds are normal. There is no tenderness.  Genitourinary:  Genitourinary Comments: Deferred-- gyn  Musculoskeletal: Normal range of motion.  She exhibits no edema or deformity.  Neurological: She is alert and oriented to person, place, and time.  Skin: Skin is warm and dry. She is not diaphoretic.  Psychiatric: She has a normal mood and affect. Her behavior is normal. Judgment and thought content normal.  Nursing note and vitals reviewed.   BP 122/80 (BP Location: Right Arm, Cuff Size: Normal)   Pulse 60   Temp 98.2 F (36.8 C) (Oral)   Resp 16   Ht 5\' 1"  (1.549 m)   Wt 161 lb 6.4 oz (73.2 kg)   SpO2 97%   BMI 30.50 kg/m  Wt Readings  from Last 3 Encounters:  04/04/16 161 lb 6.4 oz (73.2 kg)  11/30/15 159 lb (72.1 kg)  10/04/15 158 lb 12.8 oz (72 kg)      Immunization History  Administered Date(s) Administered  . Influenza Whole 11/03/2006, 11/01/2008  . Influenza,inj,Quad PF,36+ Mos 10/04/2015  . Influenza-Unspecified 10/20/2012  . Pneumococcal Polysaccharide-23 11/03/2006, 10/02/2014  . Td 11/29/2001  . Tdap 10/18/2012  . Zoster 10/05/2009    Health Maintenance  Topic Date Due  . HEMOGLOBIN A1C  05/29/2016  . PAP SMEAR  09/29/2016  . URINE MICROALBUMIN  10/03/2016  . FOOT EXAM  11/29/2016  . OPHTHALMOLOGY EXAM  02/10/2017  . COLONOSCOPY  08/16/2017  . MAMMOGRAM  09/13/2017  . TETANUS/TDAP  10/19/2022  . PNEUMOCOCCAL POLYSACCHARIDE VACCINE  Completed  . INFLUENZA VACCINE  Completed  . Hepatitis C Screening  Completed  . HIV Screening  Completed    Lab Results  Component Value Date   WBC 7.1 10/04/2015   HGB 15.0 10/04/2015   HCT 44.2 10/04/2015   PLT 326.0 10/04/2015   GLUCOSE 91 10/04/2015   CHOL 171 10/04/2015   TRIG 116.0 10/04/2015   HDL 51.60 10/04/2015   LDLDIRECT 128.1 10/05/2013   LDLCALC 96 10/04/2015   ALT 34 10/04/2015   AST 29 10/04/2015   NA 141 10/04/2015   K 4.4 10/04/2015   CL 106 10/04/2015   CREATININE 0.73 10/04/2015   BUN 21 10/04/2015   CO2 29 10/04/2015   TSH 0.96 10/04/2015   HGBA1C 5.7 11/30/2015   MICROALBUR <0.7 10/04/2015    Lab Results  Component Value  Date   TSH 0.96 10/04/2015   Lab Results  Component Value Date   WBC 7.1 10/04/2015   HGB 15.0 10/04/2015   HCT 44.2 10/04/2015   MCV 87.3 10/04/2015   PLT 326.0 10/04/2015   Lab Results  Component Value Date   NA 141 10/04/2015   K 4.4 10/04/2015   CO2 29 10/04/2015   GLUCOSE 91 10/04/2015   BUN 21 10/04/2015   CREATININE 0.73 10/04/2015   BILITOT 0.6 10/04/2015   ALKPHOS 68 10/04/2015   AST 29 10/04/2015   ALT 34 10/04/2015   PROT 7.6 10/04/2015   ALBUMIN 4.6 10/04/2015   CALCIUM 9.9 10/04/2015   GFR 86.96 10/04/2015   Lab Results  Component Value Date   CHOL 171 10/04/2015   Lab Results  Component Value Date   HDL 51.60 10/04/2015   Lab Results  Component Value Date   LDLCALC 96 10/04/2015   Lab Results  Component Value Date   TRIG 116.0 10/04/2015   Lab Results  Component Value Date   CHOLHDL 3 10/04/2015   Lab Results  Component Value Date   HGBA1C 5.7 11/30/2015         Assessment & Plan:   Problem List Items Addressed This Visit      Unprioritized   Diabetes mellitus type II, uncontrolled ([place])    hgba1c acceptable, minimize simple carbs. Increase exercise as tolerated. Continue current meds Glucose range 90-130  No complaints--- no symptomatic hypoglycemia Pt sees endo con't meds--she is almost out and does not see endo until April  Will refill meds       Relevant Medications   Saxagliptin-Metformin (KOMBIGLYZE XR) 2.05-998 MG TB24   atorvastatin (LIPITOR) 20 MG tablet   canagliflozin (INVOKANA) 300 MG TABS tablet   Essential hypertension    Well controlled, no changes to meds. Encouraged heart healthy diet such as the DASH diet and exercise  as tolerated.       Relevant Medications   atenolol (TENORMIN) 25 MG tablet   atorvastatin (LIPITOR) 20 MG tablet   fenofibrate 160 MG tablet   Other Relevant Orders   Comprehensive metabolic panel   GERD    Pt having inc in symptoms on nexium Inc to bid and refer to GI      Relevant  Medications   Probiotic Product (PROBIOTIC PO)   esomeprazole (NEXIUM) 40 MG capsule   Hyperlipidemia    Tolerating statin, encouraged heart healthy diet, avoid trans fats, minimize simple carbs and saturated fats. Increase exercise as tolerated      Relevant Medications   atenolol (TENORMIN) 25 MG tablet   atorvastatin (LIPITOR) 20 MG tablet   fenofibrate 160 MG tablet   Other Relevant Orders   Lipid panel    Other Visit Diagnoses    DM (diabetes mellitus) type II uncontrolled, periph vascular disorder (HCC)       Relevant Medications   Saxagliptin-Metformin (KOMBIGLYZE XR) 2.05-998 MG TB24   atenolol (TENORMIN) 25 MG tablet   atorvastatin (LIPITOR) 20 MG tablet   canagliflozin (INVOKANA) 300 MG TABS tablet   fenofibrate 160 MG tablet      I have discontinued Ms. Slappey's Cholecalciferol (VITAMIN D PO) and metoprolol succinate. I am also having her maintain her Propylene Glycol, Flaxseed Oil, aspirin, multivitamin with minerals, glucose blood, Calcium Citrate-Vitamin D (CALCIUM + D PO), ONETOUCH DELICA LANCETS 91P, Cranberry, bromocriptine, mometasone, Ipratropium-Albuterol, Probiotic Product (PROBIOTIC PO), Saxagliptin-Metformin, atenolol, atorvastatin, montelukast, esomeprazole, canagliflozin, and fenofibrate.  Meds ordered this encounter  Medications  . Probiotic Product (PROBIOTIC PO)    Sig: Take 1 capsule by mouth at bedtime.  [name] DISCONTD: montelukast (SINGULAIR) 10 MG tablet    Sig: TAKE 1 TABLET DAILY    Dispense:  90 tablet    Refill:  1  . DISCONTD: esomeprazole (NEXIUM) 40 MG capsule    Sig: Take 1 capsule (40 mg total) by mouth 2 (two) times daily before a meal.    Dispense:  180 capsule    Refill:  1  . DISCONTD: fenofibrate 160 MG tablet    Sig: Take 1 tablet (160 mg total) by mouth daily.    Dispense:  90 tablet    Refill:  1  . DISCONTD: atenolol (TENORMIN) 25 MG tablet    Sig: Take 1 tablet by mouth daily--    Dispense:  90 tablet    Refill:  1  .  DISCONTD: atorvastatin (LIPITOR) 20 MG tablet    Sig: Take 1 tablet (20 mg total) by mouth daily.    Dispense:  90 tablet    Refill:  1  . DISCONTD: canagliflozin (INVOKANA) 300 MG TABS tablet    Sig: Take 1 tablet (300 mg total) by mouth daily.    Dispense:  90 tablet    Refill:  1  . DISCONTD: Saxagliptin-Metformin (KOMBIGLYZE XR) 2.05-998 MG TB24    Sig: Take 2 tablets by mouth every morning.    Dispense:  180 tablet    Refill:  1  . Saxagliptin-Metformin (KOMBIGLYZE XR) 2.05-998 MG TB24    Sig: Take 2 tablets by mouth every morning.    Dispense:  60 tablet    Refill:  0  . atenolol (TENORMIN) 25 MG tablet    Sig: Take 1 tablet by mouth daily--    Dispense:  30 tablet    Refill:  0  . atorvastatin (LIPITOR) 20 MG tablet    Sig: Take  1 tablet (20 mg total) by mouth daily.    Dispense:  30 tablet    Refill:  0  . montelukast (SINGULAIR) 10 MG tablet    Sig: TAKE 1 TABLET DAILY    Dispense:  30 tablet    Refill:  0  . esomeprazole (NEXIUM) 40 MG capsule    Sig: Take 1 capsule (40 mg total) by mouth 2 (two) times daily before a meal.    Dispense:  60 capsule    Refill:  0  . canagliflozin (INVOKANA) 300 MG TABS tablet    Sig: Take 1 tablet (300 mg total) by mouth daily.    Dispense:  30 tablet    Refill:  0  . fenofibrate 160 MG tablet    Sig: Take 1 tablet (160 mg total) by mouth daily.    Dispense:  30 tablet    Refill:  0    CMA served as scribe during this visit. History, Physical and Plan performed by medical provider. Documentation and orders reviewed and attested to.  [name], DO

## 2016-04-04 NOTE — Progress Notes (Signed)
Pre visit review using our clinic review tool, if applicable. No additional management support is needed unless otherwise documented below in the visit note. 

## 2016-04-04 NOTE — Assessment & Plan Note (Signed)
Pt having inc in symptoms on nexium Inc to bid and refer to GI

## 2016-04-07 ENCOUNTER — Other Ambulatory Visit: Payer: Self-pay | Admitting: Family Medicine

## 2016-04-07 DIAGNOSIS — E785 Hyperlipidemia, unspecified: Secondary | ICD-10-CM

## 2016-04-07 MED ORDER — ATORVASTATIN CALCIUM 40 MG PO TABS: 40.0000 mg | ORAL_TABLET | Freq: Every day | ORAL | 1 refills | Status: DC

## 2016-04-08 ENCOUNTER — Telehealth: Payer: Self-pay | Admitting: Endocrinology

## 2016-04-08 MED ORDER — BROMOCRIPTINE MESYLATE 2.5 MG PO TABS: ORAL_TABLET | ORAL | 3 refills | Status: DC

## 2016-04-08 MED ORDER — SITAGLIP PHOS-METFORMIN HCL ER 50-1000 MG PO TB24: 2.0000 | ORAL_TABLET | Freq: Every day | ORAL | 11 refills | Status: DC

## 2016-04-08 NOTE — Telephone Encounter (Signed)
See message. Refill for bromocriptine submitted.

## 2016-04-08 NOTE — Telephone Encounter (Signed)
Refill  bromocriptine (PARLODEL) 2.5 MG tablet 24 tablet   insurance will not Saxagliptin-Metformin (KOMBIGLYZE XR) 2.05-998 MG TB24  But they will cover the Janumet  Send to [facility]  [phone]

## 2016-04-08 NOTE — Telephone Encounter (Signed)
Ok, I have sent a prescription to your pharmacy 

## 2016-04-09 NOTE — Telephone Encounter (Signed)
I contacted the patient and advised bromocriptine and janumet have been submitted to the [facility] in [place].

## 2016-04-14 ENCOUNTER — Encounter: Payer: Self-pay | Admitting: *Deleted

## 2016-04-23 ENCOUNTER — Telehealth: Payer: Self-pay | Admitting: Family Medicine

## 2016-04-23 NOTE — Telephone Encounter (Signed)
Caller name: [place]    Reason for call:  [employer] in need of clarification regarding SitaGLIPtin-MetFORMIN HCl (JANUMET XR) 50-1000 MG TB24 please call  [phone] reference # [id number]

## 2016-04-24 NOTE — Telephone Encounter (Signed)
Error as was sent to local pharmacy.

## 2016-04-26 ENCOUNTER — Encounter (HOSPITAL_COMMUNITY): Payer: Self-pay

## 2016-04-26 ENCOUNTER — Other Ambulatory Visit: Payer: Self-pay

## 2016-04-26 ENCOUNTER — Emergency Department (HOSPITAL_COMMUNITY)
Admission: EM | Admit: 2016-04-26 | Discharge: 2016-04-26 | Disposition: A | Discharge status: Home / Self Care | Payer: No Typology Code available for payment source | Attending: Emergency Medicine | Admitting: Emergency Medicine

## 2016-04-26 DIAGNOSIS — J45909 Unspecified asthma, uncomplicated: Secondary | ICD-10-CM | POA: Insufficient documentation

## 2016-04-26 DIAGNOSIS — I10 Essential (primary) hypertension: Secondary | ICD-10-CM | POA: Insufficient documentation

## 2016-04-26 DIAGNOSIS — E119 Type 2 diabetes mellitus without complications: Secondary | ICD-10-CM | POA: Insufficient documentation

## 2016-04-26 DIAGNOSIS — Z7982 Long term (current) use of aspirin: Secondary | ICD-10-CM | POA: Insufficient documentation

## 2016-04-26 DIAGNOSIS — T7840XA Allergy, unspecified, initial encounter: Secondary | ICD-10-CM

## 2016-04-26 DIAGNOSIS — L509 Urticaria, unspecified: Secondary | ICD-10-CM

## 2016-04-26 DIAGNOSIS — L500 Allergic urticaria: Secondary | ICD-10-CM | POA: Insufficient documentation

## 2016-04-26 MED ORDER — METHYLPREDNISOLONE SODIUM SUCC 125 MG IJ SOLR
125.0000 mg | Freq: Once | INTRAMUSCULAR | Status: AC
  Administered 2016-04-26: 125 mg via INTRAVENOUS
  Filled 2016-04-26: qty 2

## 2016-04-26 MED ORDER — DIPHENHYDRAMINE HCL 25 MG PO CAPS: 25.0000 mg | ORAL_CAPSULE | Freq: Four times a day (QID) | ORAL | 0 refills | Status: DC | PRN

## 2016-04-26 MED ORDER — DIPHENHYDRAMINE HCL 25 MG PO CAPS
25.0000 mg | ORAL_CAPSULE | Freq: Once | ORAL | Status: AC
  Administered 2016-04-26: 25 mg via ORAL
  Filled 2016-04-26: qty 1

## 2016-04-26 MED ORDER — EPINEPHRINE 0.3 MG/0.3ML IJ SOAJ
0.3000 mg | Freq: Once | INTRAMUSCULAR | Status: AC
  Administered 2016-04-26: 0.3 mg via INTRAMUSCULAR
  Filled 2016-04-26: qty 0.3

## 2016-04-26 MED ORDER — ONDANSETRON HCL 4 MG PO TABS: 4.0000 mg | ORAL_TABLET | Freq: Four times a day (QID) | ORAL | 0 refills | Status: DC

## 2016-04-26 MED ORDER — PREDNISONE 20 MG PO TABS: 60.0000 mg | ORAL_TABLET | Freq: Every day | ORAL | 0 refills | Status: DC

## 2016-04-26 MED ORDER — ONDANSETRON HCL 4 MG/2ML IJ SOLN
4.0000 mg | Freq: Once | INTRAMUSCULAR | Status: AC
  Administered 2016-04-26: 4 mg via INTRAVENOUS
  Filled 2016-04-26: qty 2

## 2016-04-26 MED ORDER — FAMOTIDINE IN NACL 20-0.9 MG/50ML-% IV SOLN
20.0000 mg | Freq: Once | INTRAVENOUS | Status: AC
  Administered 2016-04-26: 20 mg via INTRAVENOUS
  Filled 2016-04-26: qty 50

## 2016-04-26 MED ORDER — METOCLOPRAMIDE HCL 5 MG/ML IJ SOLN
10.0000 mg | Freq: Once | INTRAMUSCULAR | Status: AC
  Administered 2016-04-26: 10 mg via INTRAVENOUS
  Filled 2016-04-26: qty 2

## 2016-04-26 MED ORDER — FAMOTIDINE 20 MG PO TABS: 20.0000 mg | ORAL_TABLET | Freq: Two times a day (BID) | ORAL | 0 refills | Status: DC

## 2016-04-26 NOTE — ED Provider Notes (Signed)
[facility] DEPT Provider Note   CSN: [id number] Arrival date & time: 04/26/16  0023  By signing my name below, I, Neta Mends, attest that this documentation has been dictated under the direction and in the presence of Charlann Lange, PA-C. Electronically Signed: Neta Mends, ED Scribe. 04/26/2016. 12:38 AM.    History   Chief Complaint Chief Complaint  Patient presents with  . Allergic Reaction    The history is provided by the patient. No language interpreter was used.   HPI Comments:  Nicole Richard is a 61 y.o. female who presents to the Emergency Department complaining of sudden onset generalized rash, upper leg swelling, mild SOB onset at approximately 2000 yesterday. Pt took 50mg  of benadryl with no relief. No known contact with any known allergens. Pt complains of associated nausea without vomiting. Denies any new foods or medications. Denies headache.   Past Medical History:  Diagnosis Date  . Allergy    seasonal  . Asthma   . GERD (gastroesophageal reflux disease)   . Hyperlipidemia   . Hypertension     Patient Active Problem List   Diagnosis Date Noted  . Diabetes mellitus type II, uncontrolled ([place]) 05/25/2015  . Diabetes ([place]) 01/29/2014  . Abdominal pain, left upper quadrant 03/01/2013  . Nonspecific (abnormal) findings on radiological and other examination of biliary tract 03/01/2013  . Abnormal liver function tests 03/01/2013  . Obesity (BMI 30-39.9) 10/18/2012  . ESOPHAGEAL STRICTURE 11/09/2009  . SKIN TAG 10/05/2009  . GERD 09/14/2009  . POSTMENOPAUSAL STATUS 09/14/2009  . BREAST IMPLANTS, BILATERAL, HX OF 07/28/2007  . Hyperlipidemia 05/18/2006  . Essential hypertension 05/18/2006  . CYSTOCELE WITH INCOMPLETE UTERINE PROLAPSE 05/18/2006  . HIRSUTISM 05/18/2006  . HEADACHE 05/18/2006  . LAPAROSCOPY, HX OF 05/18/2006    Past Surgical History:  Procedure Laterality Date  . ABDOMINAL HYSTERECTOMY  08/2004  . APPENDECTOMY    .  BREAST ENHANCEMENT SURGERY  2001  . COLONOSCOPY    . POLYPECTOMY  2009   sigmoid polyps  . SHOULDER ARTHROSCOPY W/ ROTATOR CUFF REPAIR Right 06/06/2015   [facility]  . TONSILLECTOMY    . TUBAL LIGATION      OB History    No data available       Home Medications    Prior to Admission medications   Medication Sig Start Date End Date Taking? Authorizing Provider  aspirin 81 MG tablet Take 81 mg by mouth daily.    Historical Provider, MD  atenolol (TENORMIN) 25 MG tablet Take 1 tablet by mouth daily-- 04/04/16   Rosalita Chessman Chase, DO  atorvastatin (LIPITOR) 40 MG tablet Take 1 tablet (40 mg total) by mouth daily. 04/07/16   Rosalita Chessman Chase, DO  bromocriptine (PARLODEL) 2.5 MG tablet 1/4 tab daily 04/08/16   Renato Shin, MD  Calcium Citrate-Vitamin D (CALCIUM + D PO) Take 600-1,000 mg by mouth once.     Historical Provider, MD  canagliflozin (INVOKANA) 300 MG TABS tablet Take 1 tablet (300 mg total) by mouth daily. 04/04/16   Alferd Apa Lowne Chase, DO  Cranberry 250 MG TABS Take 2 tablets by mouth daily.    Historical Provider, MD  esomeprazole (NEXIUM) 40 MG capsule Take 1 capsule (40 mg total) by mouth 2 (two) times daily before a meal. 04/04/16 06/06/17  Yvonne R Lowne Chase, DO  fenofibrate 160 MG tablet Take 1 tablet (160 mg total) by mouth daily. 04/04/16   Rosalita Chessman Chase, DO  Flaxseed, Linseed, (FLAXSEED  OIL) 1200 MG CAPS Take 1 capsule by mouth daily.    Historical Provider, MD  glucose blood (ONE TOUCH ULTRA TEST) test strip One touch ultra blue test strips. Check blood sugar twice daily E.119 Patient taking differently: One touch ultra blue test strips. Check blood sugar once daily E.119 03/23/14   Alferd Apa Lowne Chase, DO  Ipratropium-Albuterol (COMBIVENT RESPIMAT) 20-100 MCG/ACT AERS respimat Inhale 1 puff into the lungs 4 (four) times daily. Patient taking differently: Inhale 1 puff into the lungs 4 (four) times daily as needed.  12/04/15   Yvonne R Lowne Chase, DO    mometasone (NASONEX) 50 MCG/ACT nasal spray Place 2 sprays into the nose daily. Patient taking differently: Place 2 sprays into the nose daily as needed.  12/04/15 10/26/17  Mosie Lukes, MD  montelukast (SINGULAIR) 10 MG tablet TAKE 1 TABLET DAILY 04/04/16   [name], DO  Multiple Vitamin (MULTIVITAMIN WITH MINERALS) TABS tablet Take 1 tablet by mouth daily.    Historical Provider, MD  [facility] DELICA LANCETS 42V MISC Check Blood sugar twice daily. Dx:E11.9 Patient taking differently: Check Blood sugar once daily. Dx:E11.9 12/25/14   Rosalita Chessman Chase, DO  Probiotic Product (PROBIOTIC PO) Take 1 capsule by mouth at bedtime.    Historical Provider, MD  Propylene Glycol (SYSTANE BALANCE) 0.6 % SOLN Apply 1 drop to eye as needed.    Historical Provider, MD  SitaGLIPtin-MetFORMIN HCl (JANUMET XR) 50-1000 MG TB24 Take 2 tablets by mouth daily. 04/08/16   Renato Shin, MD    Family History Family History  Problem Relation Age of Onset  . Rheumatic fever Mother   . Irritable bowel syndrome Mother   . Diabetes Father   . Colon polyps Father   . Kidney disease Father   . Coronary artery disease    . Hyperlipidemia    . Hypertension    . Asthma    . Cervical cancer Maternal Grandmother   . Breast cancer Maternal Grandmother   . Colon cancer Maternal Grandmother   . Uterine cancer Maternal Grandmother   . Esophageal cancer Neg Hx   . Rectal cancer Neg Hx   . Stomach cancer Neg Hx     Social History Social History  Substance Use Topics  . Smoking status: Never Smoker  . Smokeless tobacco: Never Used  . Alcohol use Yes     Comment: rare     Allergies   Contrast media [iodinated diagnostic agents]; Metamucil [psyllium]; and Mustard seed   Review of Systems Review of Systems  HENT: Positive for facial swelling. Negative for trouble swallowing.   Respiratory: Positive for shortness of breath (mild). Negative for cough.   Cardiovascular: Negative for chest pain.   Gastrointestinal: Positive for nausea. Negative for abdominal pain and vomiting.  Skin: Positive for rash.  All other systems reviewed and are negative.    Physical Exam Updated Vital Signs BP (!) 133/109 (BP Location: Left Arm)   Pulse 100   Temp 98.1 F (36.7 C) (Oral)   Resp 20   SpO2 98%   Physical Exam  Constitutional: She appears well-developed and well-nourished. No distress.  HENT:  Head: Normocephalic and atraumatic.  Mild swelling of upper lip, no intraoral swelling.   Eyes: Conjunctivae are normal.  Cardiovascular: Normal rate and regular rhythm.   No murmur heard. Pulmonary/Chest: Effort normal and breath sounds normal. No stridor. No respiratory distress. She has no wheezes. She has no rales. She exhibits no tenderness.  Abdominal: She  exhibits no distension. There is no tenderness.  Neurological: She is alert.  Skin: Skin is warm and dry.  Generalized, raised red rash consistent with hives.   Psychiatric: She has a normal mood and affect.  Nursing note and vitals reviewed.    ED Treatments / Results  DIAGNOSTIC STUDIES:  Oxygen Saturation is 98% on RA, normal by my interpretation.    COORDINATION OF CARE:  12:38 AM Discussed treatment plan with pt at bedside and pt agreed to plan.   Labs (all labs ordered are listed, but only abnormal results are displayed) Labs Reviewed - No data to display  EKG  EKG Interpretation None       Radiology No results found.  Procedures Procedures (including critical care time)  Medications Ordered in ED Medications - No data to display   Initial Impression / Assessment and Plan / ED Course  I have reviewed the triage vital signs and the nursing notes.  Pertinent labs & imaging results that were available during my care of the patient were reviewed by me and considered in my medical decision making (see chart for details).     Patient presents with acute allergic reaction consisting of hives, upper lip  swelling and mild SOB without hypoxia. At 12:45 she is given 0.3 Epi IM, IV pepcid, IV solumedrol. (she had Benadryl one hour prior to arrival). Will observe for 3 hours to insure no further decline.   1:45 - patient's hives are significantly improved. There is still swelling of the upper lip. No SOB.  2:45 - Lip swelling persistent but improving. No further SOB.  Minimal rash. Will add Benadryl.   3:00 - She has developed nausea, will add zofran.  3:30 - patient complains of chest tightness since receiving the Zofran and Benadryl. EKG done and has no concerning changes. Continue to observe.  4:20 - chest tightness is improving. No recurrent rash, worsening lip swelling or SOB. She is having recurrent nausea, reglan provided.  5:10 - Nausea resolved. Discussed discharge home and patient is comfortable with care plan. Return precautions discussed.       Final Clinical Impressions(s) / ED Diagnoses   Final diagnoses:  None   1. Acute allergic reaction  New Prescriptions New Prescriptions   No medications on file  I personally performed the services described in this documentation, which was scribed in my presence. The recorded information has been reviewed and is accurate.     Charlann Lange, PA-C 04/26/16 [id number]    Lacretia Leigh, MD 04/27/16 [phone]

## 2016-04-26 NOTE — ED Triage Notes (Addendum)
Pt presents with swollen lips and states that she is having an allergic reaction, but is not sure to what. She also endorses her throat feeling tight, nausea and SOB. A&Ox4. Pt took benadryl about 45 mins ago.

## 2016-04-26 NOTE — ED Notes (Signed)
RN and EDP made aware of pt.

## 2016-05-01 ENCOUNTER — Other Ambulatory Visit: Payer: Self-pay | Admitting: Family Medicine

## 2016-05-01 DIAGNOSIS — E1151 Type 2 diabetes mellitus with diabetic peripheral angiopathy without gangrene: Secondary | ICD-10-CM

## 2016-05-01 DIAGNOSIS — IMO0002 Reserved for concepts with insufficient information to code with codable children: Secondary | ICD-10-CM

## 2016-05-01 DIAGNOSIS — I10 Essential (primary) hypertension: Secondary | ICD-10-CM

## 2016-05-01 DIAGNOSIS — E1165 Type 2 diabetes mellitus with hyperglycemia: Principal | ICD-10-CM

## 2016-05-01 DIAGNOSIS — K219 Gastro-esophageal reflux disease without esophagitis: Secondary | ICD-10-CM

## 2016-05-09 ENCOUNTER — Ambulatory Visit (INDEPENDENT_AMBULATORY_CARE_PROVIDER_SITE_OTHER): Payer: PRIVATE HEALTH INSURANCE | Admitting: Family Medicine

## 2016-05-09 ENCOUNTER — Encounter: Payer: Self-pay | Admitting: Family Medicine

## 2016-05-09 VITALS — BP 100/60 | HR 99 | Temp 98.5°F | Ht 61.0 in | Wt 155.0 lb

## 2016-05-09 DIAGNOSIS — J208 Acute bronchitis due to other specified organisms: Secondary | ICD-10-CM | POA: Diagnosis not present

## 2016-05-09 DIAGNOSIS — J4531 Mild persistent asthma with (acute) exacerbation: Secondary | ICD-10-CM

## 2016-05-09 DIAGNOSIS — B9689 Other specified bacterial agents as the cause of diseases classified elsewhere: Secondary | ICD-10-CM

## 2016-05-09 MED ORDER — BUDESONIDE 90 MCG/ACT IN AEPB: INHALATION_SPRAY | RESPIRATORY_TRACT | 1 refills | Status: DC

## 2016-05-09 MED ORDER — AZITHROMYCIN 250 MG PO TABS: ORAL_TABLET | ORAL | 0 refills | Status: DC

## 2016-05-09 NOTE — Progress Notes (Signed)
Chief Complaint  Patient presents with  . Cough    dry-x 2 days-pt states heavy in the chest    Nicole Richard here for URI complaints.  Duration: 2 days  Associated symptoms: rhinorrhea, ear pain, sore throat, wheezing, shortness of breath and fevers (101F) Denies: sinus congestion, sinus pain, ear drainage, myalgia and rigors Treatment to date: Tylenol Sick contacts: No  ROS:  Const: +fevers HEENT: As noted in HPI Lungs: No SOB  Past Medical History:  Diagnosis Date  . Allergy    seasonal  . Asthma   . GERD (gastroesophageal reflux disease)   . Hyperlipidemia   . Hypertension    Family History  Problem Relation Age of Onset  . Rheumatic fever Mother   . Irritable bowel syndrome Mother   . Diabetes Father   . Colon polyps Father   . Kidney disease Father   . Coronary artery disease    . Hyperlipidemia    . Hypertension    . Asthma    . Cervical cancer Maternal Grandmother   . Breast cancer Maternal Grandmother   . Colon cancer Maternal Grandmother   . Uterine cancer Maternal Grandmother   . Esophageal cancer Neg Hx   . Rectal cancer Neg Hx   . Stomach cancer Neg Hx     BP 100/60 (BP Location: Left Arm, Patient Position: Sitting, Cuff Size: Normal)   Pulse 99   Temp 98.5 F (36.9 C) (Oral)   Ht 5\' 1"  (1.549 m)   Wt 155 lb (70.3 kg)   SpO2 97%   BMI 29.29 kg/m  General: Awake, alert, appears stated age HEENT: AT, [place], ears patent b/l and TM's neg, nares patent w/o discharge, pharynx pink and without exudates, MMM Neck: No masses or asymmetry Heart: RRR, no murmurs, no bruits Lungs: CTAB, no accessory muscle use, +coughing upon deep inhalation Psych: Age appropriate judgment and insight, normal mood and affect  Acute bacterial bronchitis - Plan: azithromycin (ZITHROMAX) 250 MG tablet  Mild persistent asthma with acute exacerbation - Plan: Budesonide 90 MCG/ACT inhaler  Orders as above. Will tx given fever. No wheezing on exam, ICS. Combivent  prn. Continue to push fluids, practice good hand hygiene, cover mouth when coughing. F/u prn. If starting to experience continued fevers, shaking, or shortness of breath, seek immediate care. Pt voiced understanding and agreement to the plan.  [place], DO 05/09/16 10:13 AM

## 2016-05-09 NOTE — Patient Instructions (Signed)
Continue to push fluids, practice good hand hygiene, and cover your mouth if you cough.  If you start having fevers, shaking or shortness of breath, seek immediate care.  

## 2016-05-09 NOTE — Progress Notes (Signed)
Pre visit review using our clinic review tool, if applicable. No additional management support is needed unless otherwise documented below in the visit note. 

## 2016-05-29 ENCOUNTER — Other Ambulatory Visit: Payer: Self-pay | Admitting: Family Medicine

## 2016-05-29 ENCOUNTER — Ambulatory Visit (INDEPENDENT_AMBULATORY_CARE_PROVIDER_SITE_OTHER): Payer: PRIVATE HEALTH INSURANCE | Admitting: Endocrinology

## 2016-05-29 ENCOUNTER — Other Ambulatory Visit: Payer: Self-pay

## 2016-05-29 ENCOUNTER — Encounter: Payer: Self-pay | Admitting: Endocrinology

## 2016-05-29 VITALS — BP 110/70 | HR 66 | Ht 61.0 in | Wt 157.0 lb

## 2016-05-29 DIAGNOSIS — E1151 Type 2 diabetes mellitus with diabetic peripheral angiopathy without gangrene: Secondary | ICD-10-CM

## 2016-05-29 DIAGNOSIS — IMO0002 Reserved for concepts with insufficient information to code with codable children: Secondary | ICD-10-CM

## 2016-05-29 DIAGNOSIS — E1165 Type 2 diabetes mellitus with hyperglycemia: Secondary | ICD-10-CM | POA: Diagnosis not present

## 2016-05-29 DIAGNOSIS — Z1231 Encounter for screening mammogram for malignant neoplasm of breast: Secondary | ICD-10-CM

## 2016-05-29 DIAGNOSIS — E119 Type 2 diabetes mellitus without complications: Secondary | ICD-10-CM

## 2016-05-29 LAB — POCT GLYCOSYLATED HEMOGLOBIN (HGB A1C): Hemoglobin A1C: 5.8

## 2016-05-29 MED ORDER — CANAGLIFLOZIN 300 MG PO TABS: 300.0000 mg | ORAL_TABLET | Freq: Every day | ORAL | 3 refills | Status: DC

## 2016-05-29 MED ORDER — BROMOCRIPTINE MESYLATE 2.5 MG PO TABS: ORAL_TABLET | ORAL | 3 refills | Status: DC

## 2016-05-29 MED ORDER — SITAGLIP PHOS-METFORMIN HCL ER 50-1000 MG PO TB24: 2.0000 | ORAL_TABLET | Freq: Every day | ORAL | 3 refills | Status: DC

## 2016-05-29 NOTE — Patient Instructions (Signed)
check your blood sugar once a day.  vary the time of day when you check, between before the 3 meals, and at bedtime.  also check if you have symptoms of your blood sugar being too high or too low.  please keep a record of the readings and bring it to your next appointment here.  You can write it on any piece of paper.  please call us sooner if your blood sugar goes below 70, or if you have a lot of readings over 200.  Please continue the same medications.  Please come back for a follow-up appointment in 6 months.    

## 2016-05-29 NOTE — Progress Notes (Signed)
Subjective:    Patient ID: Nicole Richard, female    DOB: May 04, 1955, 61 y.o.   MRN: [id number]  HPI Pt returns for f/u of diabetes mellitus: DM type: 2 Dx'ed: 8921 Complications: none Therapy: 4 oral meds GDM: never DKA: never Severe hypoglycemia: never.  Pancreatitis: never.  Other: she has never taken insulin.  Interval history: no cbg record, but states cbg's are well-controlled. pt states she feels well in general.   Past Medical History:  Diagnosis Date  . Allergy    seasonal  . Asthma   . GERD (gastroesophageal reflux disease)   . Hyperlipidemia   . Hypertension     Past Surgical History:  Procedure Laterality Date  . ABDOMINAL HYSTERECTOMY  08/2004  . APPENDECTOMY    . BREAST ENHANCEMENT SURGERY  2001  . COLONOSCOPY    . POLYPECTOMY  2009   sigmoid polyps  . SHOULDER ARTHROSCOPY W/ ROTATOR CUFF REPAIR Right 06/06/2015   [facility]  . TONSILLECTOMY    . TUBAL LIGATION      Social History   Social History  . Marital status: Divorced    Spouse name: N/A  . Number of children: 2  . Years of education: N/A   Occupational History  . [employer]     Social History Main Topics  . Smoking status: Never Smoker  . Smokeless tobacco: Never Used  . Alcohol use Yes     Comment: rare  . Drug use: No  . Sexual activity: Not Currently    Partners: Male   Other Topics Concern  . Not on file   Social History Narrative   Exercise--walking , and pt daily    Current Outpatient Prescriptions on File Prior to Visit  Medication Sig Dispense Refill  . aspirin 81 MG tablet Take 81 mg by mouth at bedtime.     [name] atenolol (TENORMIN) 25 MG tablet TAKE 1 TABLET BY MOUTH DAILY 30 tablet 5  . atorvastatin (LIPITOR) 40 MG tablet Take 1 tablet (40 mg total) by mouth daily. (Patient taking differently: Take 40 mg by mouth at bedtime. ) 90 tablet 1  . Budesonide 90 MCG/ACT inhaler 2 puffs twice daily. Rinse mouth out after use. 1 Inhaler 1  . Calcium Citrate-Vitamin D  (CALCIUM + D PO) Take 1 tablet by mouth daily.     . Cranberry 250 MG TABS Take 500 mg by mouth daily.     . diphenhydrAMINE (BENADRYL) 25 mg capsule Take 1 capsule (25 mg total) by mouth every 6 (six) hours as needed for itching. Continue regularly for 3 days, then as needed for recurrent rash or itching. 30 capsule 0  . esomeprazole (NEXIUM) 40 MG capsule TAKE ONE CAPSULE BY MOUTH TWICE A DAY BEFORE A MEAL 60 capsule 5  . famotidine (PEPCID) 20 MG tablet Take 1 tablet (20 mg total) by mouth 2 (two) times daily. Continue regularly for 3 days, then take as needed for recurrent symptoms. 30 tablet 0  . fenofibrate 160 MG tablet TAKE 1 TABLET BY MOUTH DAILY 30 tablet 5  . Flaxseed, Linseed, (FLAXSEED OIL) 1200 MG CAPS Take 1 capsule by mouth daily.    [name] glucose blood (ONE TOUCH ULTRA TEST) test strip One touch ultra blue test strips. Check blood sugar twice daily E.119 (Patient taking differently: One touch ultra blue test strips. Check blood sugar once daily E.119) 100 each 12  . Ipratropium-Albuterol (COMBIVENT RESPIMAT) 20-100 MCG/ACT AERS respimat Inhale 1 puff into the lungs 4 (four) times  daily. (Patient taking differently: Inhale 1 puff into the lungs 4 (four) times daily as needed for wheezing or shortness of breath. ) 1 Inhaler 3  . mometasone (NASONEX) 50 MCG/ACT nasal spray Place 2 sprays into the nose daily. (Patient taking differently: Place 2 sprays into the nose daily as needed (congestion). ) 17 g 2  . montelukast (SINGULAIR) 10 MG tablet TAKE 1 TABLET BY MOUTH DAILY 30 tablet 5  . Multiple Vitamin (MULTIVITAMIN WITH MINERALS) TABS tablet Take 1 tablet by mouth daily.    . ondansetron (ZOFRAN) 4 MG tablet Take 1 tablet (4 mg total) by mouth every 6 (six) hours. 12 tablet 0  . ONETOUCH DELICA LANCETS 71H MISC Check Blood sugar twice daily. Dx:E11.9 (Patient taking differently: Check Blood sugar once daily. Dx:E11.9) 100 each 12  . Probiotic Product (PROBIOTIC PO) Take 1 capsule by mouth at  bedtime.    [name] Propylene Glycol (SYSTANE BALANCE) 0.6 % SOLN Apply 1 drop to eye as needed.     No current facility-administered medications on file prior to visit.     Allergies  Allergen Reactions  . Contrast Media [Iodinated Diagnostic Agents] Anaphylaxis  . Metamucil [Psyllium] Anaphylaxis  . Mustard Seed Anaphylaxis    Family History  Problem Relation Age of Onset  . Rheumatic fever Mother   . Irritable bowel syndrome Mother   . Diabetes Father   . Colon polyps Father   . Kidney disease Father   . Coronary artery disease Unknown   . Hyperlipidemia Unknown   . Hypertension Unknown   . Asthma Unknown   . Cervical cancer Maternal Grandmother   . Breast cancer Maternal Grandmother   . Colon cancer Maternal Grandmother   . Uterine cancer Maternal Grandmother   . Esophageal cancer Neg Hx   . Rectal cancer Neg Hx   . Stomach cancer Neg Hx     BP 110/70   Pulse 66   Ht 5\' 1"  (1.549 m)   Wt 157 lb (71.2 kg)   SpO2 96%   BMI 29.66 kg/m    Review of Systems She denies hypoglycemia    Objective:   Physical Exam VITAL SIGNS:  See vs page GENERAL: no distress Pulses: dorsalis pedis intact bilat.   MSK: no deformity of the feet CV: no leg edema Skin:  no ulcer on the feet.  normal color and temp on the feet. Neuro: sensation is intact to touch on the feet Ext: There is bilateral onychomycosis of the toenails.    a1c=5.8%    Assessment & Plan:  Type 2 DM: well-controlled. HTN: well-controlled    Patient Instructions  check your blood sugar once a day.  vary the time of day when you check, between before the 3 meals, and at bedtime.  also check if you have symptoms of your blood sugar being too high or too low.  please keep a record of the readings and bring it to your next appointment here.  You can write it on any piece of paper.  please call us sooner if your blood sugar goes below 70, or if you have a lot of readings over 200.  Please continue the same  medications.  Please come back for a follow-up appointment in 6 months.

## 2016-06-02 ENCOUNTER — Other Ambulatory Visit: Payer: Self-pay

## 2016-06-02 ENCOUNTER — Encounter: Payer: Self-pay | Admitting: Endocrinology

## 2016-06-02 MED ORDER — GLUCOSE BLOOD VI STRP: ORAL_STRIP | 4 refills | Status: DC

## 2016-07-09 ENCOUNTER — Other Ambulatory Visit (INDEPENDENT_AMBULATORY_CARE_PROVIDER_SITE_OTHER): Payer: PRIVATE HEALTH INSURANCE

## 2016-07-09 DIAGNOSIS — E785 Hyperlipidemia, unspecified: Secondary | ICD-10-CM

## 2016-07-09 LAB — LIPID PANEL
CHOLESTEROL: 157 mg/dL (ref 0–200)
HDL: 48.3 mg/dL (ref >39.00)
LDL Cholesterol: 86 mg/dL (ref 0–99)
NONHDL: 108.87
Total CHOL/HDL Ratio: 3
Triglycerides: 116 mg/dL (ref 0.0–149.0)
VLDL: 23.2 mg/dL (ref 0.0–40.0)

## 2016-07-09 LAB — COMPREHENSIVE METABOLIC PANEL
ALBUMIN: 4.5 g/dL (ref 3.5–5.2)
ALK PHOS: 65 U/L (ref 39–117)
ALT: 27 U/L (ref 0–35)
AST: 23 U/L (ref 0–37)
BILIRUBIN TOTAL: 0.4 mg/dL (ref 0.2–1.2)
BUN: 27 mg/dL — AB (ref 6–23)
CO2: 25 mEq/L (ref 19–32)
CREATININE: 0.86 mg/dL (ref 0.40–1.20)
Calcium: 10 mg/dL (ref 8.4–10.5)
Chloride: 108 mEq/L (ref 96–112)
GFR: 71.79 mL/min (ref >60.00)
GLUCOSE: 107 mg/dL — AB (ref 70–99)
Potassium: 4 mEq/L (ref 3.5–5.1)
SODIUM: 141 meq/L (ref 135–145)
TOTAL PROTEIN: 7 g/dL (ref 6.0–8.3)

## 2016-09-15 ENCOUNTER — Ambulatory Visit
Admission: RE | Admit: 2016-09-15 | Discharge: 2016-09-15 | Disposition: A | Discharge status: Home / Self Care | Payer: PRIVATE HEALTH INSURANCE | Source: Ambulatory Visit | Attending: Family Medicine | Admitting: Family Medicine

## 2016-09-15 DIAGNOSIS — Z1231 Encounter for screening mammogram for malignant neoplasm of breast: Secondary | ICD-10-CM

## 2016-10-09 ENCOUNTER — Other Ambulatory Visit (HOSPITAL_COMMUNITY)
Admission: RE | Admit: 2016-10-09 | Discharge: 2016-10-09 | Disposition: A | Discharge status: Home / Self Care | Payer: PRIVATE HEALTH INSURANCE | Source: Ambulatory Visit | Attending: Family Medicine | Admitting: Family Medicine

## 2016-10-09 ENCOUNTER — Encounter: Payer: Self-pay | Admitting: Family Medicine

## 2016-10-09 ENCOUNTER — Ambulatory Visit (INDEPENDENT_AMBULATORY_CARE_PROVIDER_SITE_OTHER): Payer: Self-pay | Admitting: Family Medicine

## 2016-10-09 VITALS — BP 104/62 | HR 95 | Temp 98.1°F | Ht 61.0 in | Wt 151.0 lb

## 2016-10-09 DIAGNOSIS — E1151 Type 2 diabetes mellitus with diabetic peripheral angiopathy without gangrene: Secondary | ICD-10-CM | POA: Insufficient documentation

## 2016-10-09 DIAGNOSIS — Z0000 Encounter for general adult medical examination without abnormal findings: Secondary | ICD-10-CM | POA: Diagnosis present

## 2016-10-09 DIAGNOSIS — E785 Hyperlipidemia, unspecified: Secondary | ICD-10-CM

## 2016-10-09 DIAGNOSIS — R21 Rash and other nonspecific skin eruption: Secondary | ICD-10-CM

## 2016-10-09 LAB — CBC WITH DIFFERENTIAL/PLATELET
Basophils Absolute: 0 10*3/uL (ref 0.0–0.1)
Basophils Relative: 0.5 % (ref 0.0–3.0)
EOS ABS: 0.6 10*3/uL (ref 0.0–0.7)
EOS PCT: 8.4 % — AB (ref 0.0–5.0)
HEMATOCRIT: 44.4 % (ref 36.0–46.0)
Hemoglobin: 14.5 g/dL (ref 12.0–15.0)
LYMPHS PCT: 28.7 % (ref 12.0–46.0)
Lymphs Abs: 1.9 10*3/uL (ref 0.7–4.0)
MCHC: 32.8 g/dL (ref 30.0–36.0)
MCV: 90.3 fl (ref 78.0–100.0)
MONO ABS: 0.4 10*3/uL (ref 0.1–1.0)
Monocytes Relative: 6.5 % (ref 3.0–12.0)
Neutro Abs: 3.7 10*3/uL (ref 1.4–7.7)
Neutrophils Relative %: 55.9 % (ref 43.0–77.0)
Platelets: 313 10*3/uL (ref 150.0–400.0)
RBC: 4.91 Mil/uL (ref 3.87–5.11)
RDW: 13.3 % (ref 11.5–15.5)
WBC: 6.6 10*3/uL (ref 4.0–10.5)

## 2016-10-09 LAB — LIPID PANEL
CHOL/HDL RATIO: 2
CHOLESTEROL: 140 mg/dL (ref 0–200)
HDL: 57.4 mg/dL (ref >39.00)
LDL CALC: 69 mg/dL (ref 0–99)
NonHDL: 82.43
TRIGLYCERIDES: 68 mg/dL (ref 0.0–149.0)
VLDL: 13.6 mg/dL (ref 0.0–40.0)

## 2016-10-09 LAB — COMPREHENSIVE METABOLIC PANEL
ALBUMIN: 4.4 g/dL (ref 3.5–5.2)
ALK PHOS: 69 U/L (ref 39–117)
ALT: 19 U/L (ref 0–35)
AST: 20 U/L (ref 0–37)
BILIRUBIN TOTAL: 0.7 mg/dL (ref 0.2–1.2)
BUN: 20 mg/dL (ref 6–23)
CALCIUM: 10.1 mg/dL (ref 8.4–10.5)
CO2: 31 mEq/L (ref 19–32)
CREATININE: 0.86 mg/dL (ref 0.40–1.20)
Chloride: 105 mEq/L (ref 96–112)
GFR: 71.73 mL/min (ref >60.00)
Glucose, Bld: 96 mg/dL (ref 70–99)
Potassium: 4.4 mEq/L (ref 3.5–5.1)
SODIUM: 140 meq/L (ref 135–145)
TOTAL PROTEIN: 7 g/dL (ref 6.0–8.3)

## 2016-10-09 LAB — POC URINALSYSI DIPSTICK (AUTOMATED)
Bilirubin, UA: NEGATIVE
Glucose, UA: 1000
KETONES UA: NEGATIVE
Leukocytes, UA: NEGATIVE
Nitrite, UA: NEGATIVE
PH UA: 6 (ref 5.0–8.0)
PROTEIN UA: NEGATIVE
RBC UA: NEGATIVE
Urobilinogen, UA: 0.2 E.U./dL

## 2016-10-09 LAB — HEMOCCULT GUIAC POC 1CARD (OFFICE): Fecal Occult Blood, POC: NEGATIVE

## 2016-10-09 LAB — HEMOGLOBIN A1C: HEMOGLOBIN A1C: 6 % (ref 4.6–6.5)

## 2016-10-09 NOTE — Progress Notes (Signed)
Subjective:     Nicole Richard is a 61 y.o. female and is here for a comprehensive physical exam. The patient reports no problems.  Social History   Social History  . Marital status: Divorced    Spouse name: N/A  . Number of children: 2  . Years of education: N/A   Occupational History  . [employer]    Social History Main Topics  . Smoking status: Never Smoker  . Smokeless tobacco: Never Used  . Alcohol use Yes     Comment: rare  . Drug use: No  . Sexual activity: Not Currently    Partners: Male   Other Topics Concern  . Not on file   Social History Narrative   Exercise--walking , and pt daily   Health Maintenance  Topic Date Due  . PAP SMEAR  09/29/2016  . URINE MICROALBUMIN  10/03/2016  . INFLUENZA VACCINE  04/19/2017 (Originally 08/20/2016)  . HEMOGLOBIN A1C  11/29/2016  . OPHTHALMOLOGY EXAM  02/10/2017  . FOOT EXAM  05/29/2017  . COLONOSCOPY  08/16/2017  . MAMMOGRAM  09/16/2018  . TETANUS/TDAP  10/19/2022  . PNEUMOCOCCAL POLYSACCHARIDE VACCINE  Completed  . Hepatitis C Screening  Completed  . HIV Screening  Completed    The following portions of the patient's history were reviewed and updated as appropriate:  She  has a past medical history of Allergy; Asthma; GERD (gastroesophageal reflux disease); Hyperlipidemia; and Hypertension. She  does not have any pertinent problems on file. She  has a past surgical history that includes Tubal ligation; Appendectomy; Abdominal hysterectomy (08/2004); Tonsillectomy; Breast enhancement surgery (2001); Colonoscopy; Polypectomy (2009); Shoulder arthroscopy w/ rotator cuff repair (Right, 06/06/2015); and Augmentation mammaplasty (Bilateral, 2000). Her family history includes Asthma in her unknown relative; Breast cancer in her maternal grandmother and mother; Cervical cancer in her maternal grandmother; Colon cancer in her maternal grandmother; Colon polyps in her father; Coronary artery disease in her unknown relative;  Diabetes in her father; Hyperlipidemia in her unknown relative; Hypertension in her unknown relative; Irritable bowel syndrome in her mother; Kidney disease in her father; Rheumatic fever in her mother; Uterine cancer in her maternal grandmother. She  reports that she has never smoked. She has never used smokeless tobacco. She reports that she drinks alcohol. She reports that she does not use drugs. She has a current medication list which includes the following prescription(s): aspirin, atenolol, atorvastatin, bromocriptine, calcium citrate-vitamin d, canagliflozin, cranberry, diphenhydramine, esomeprazole, fenofibrate, flaxseed oil, glucose blood, ipratropium-albuterol, mometasone, montelukast, multivitamin with minerals, onetouch delica lancets 19J, probiotic product, propylene glycol, sitagliptin-metformin hcl, and budesonide. Current Outpatient Prescriptions on File Prior to Visit  Medication Sig Dispense Refill  . aspirin 81 MG tablet Take 81 mg by mouth at bedtime.     [name] atenolol (TENORMIN) 25 MG tablet TAKE 1 TABLET BY MOUTH DAILY 30 tablet 5  . atorvastatin (LIPITOR) 40 MG tablet Take 1 tablet (40 mg total) by mouth daily. (Patient taking differently: Take 40 mg by mouth at bedtime. ) 90 tablet 1  . bromocriptine (PARLODEL) 2.5 MG tablet 1/4 tab daily 24 tablet 3  . Calcium Citrate-Vitamin D (CALCIUM + D PO) Take 1 tablet by mouth daily.     . canagliflozin (INVOKANA) 300 MG TABS tablet Take 1 tablet (300 mg total) by mouth daily. 90 tablet 3  . Cranberry 250 MG TABS Take 500 mg by mouth daily.     . diphenhydrAMINE (BENADRYL) 25 mg capsule Take 1 capsule (25 mg total) by mouth  every 6 (six) hours as needed for itching. Continue regularly for 3 days, then as needed for recurrent rash or itching. 30 capsule 0  . esomeprazole (NEXIUM) 40 MG capsule TAKE ONE CAPSULE BY MOUTH TWICE A DAY BEFORE A MEAL 60 capsule 5  . fenofibrate 160 MG tablet TAKE 1 TABLET BY MOUTH DAILY 30 tablet 5  . Flaxseed,  Linseed, (FLAXSEED OIL) 1200 MG CAPS Take 1 capsule by mouth daily.    [name] glucose blood (ONE TOUCH ULTRA TEST) test strip One touch ultra blue test strips. Check blood sugar twice daily E.119 200 each 4  . Ipratropium-Albuterol (COMBIVENT RESPIMAT) 20-100 MCG/ACT AERS respimat Inhale 1 puff into the lungs 4 (four) times daily. (Patient taking differently: Inhale 1 puff into the lungs 4 (four) times daily as needed for wheezing or shortness of breath. ) 1 Inhaler 3  . mometasone (NASONEX) 50 MCG/ACT nasal spray Place 2 sprays into the nose daily. (Patient taking differently: Place 2 sprays into the nose daily as needed (congestion). ) 17 g 2  . montelukast (SINGULAIR) 10 MG tablet TAKE 1 TABLET BY MOUTH DAILY 30 tablet 5  . Multiple Vitamin (MULTIVITAMIN WITH MINERALS) TABS tablet Take 1 tablet by mouth daily.    [name] DELICA LANCETS 17E MISC Check Blood sugar twice daily. Dx:E11.9 (Patient taking differently: Check Blood sugar once daily. Dx:E11.9) 100 each 12  . Probiotic Product (PROBIOTIC PO) Take 1 capsule by mouth at bedtime.    [name] Propylene Glycol (SYSTANE BALANCE) 0.6 % SOLN Apply 1 drop to eye as needed.    . SitaGLIPtin-MetFORMIN HCl (JANUMET XR) 50-1000 MG TB24 Take 2 tablets by mouth daily. 180 tablet 3  . Budesonide 90 MCG/ACT inhaler 2 puffs twice daily. Rinse mouth out after use. (Patient not taking: Reported on 10/09/2016) 1 Inhaler 1   No current facility-administered medications on file prior to visit.    She is allergic to contrast media [iodinated diagnostic agents]; metamucil [psyllium]; and mustard seed..  Review of Systems Review of Systems  Constitutional: Negative for activity change, appetite change and fatigue.  HENT: Negative for hearing loss, congestion, tinnitus and ear discharge.  dentist q79m Eyes: Negative for visual disturbance (see optho q1y -- vision corrected to 20/20 with glasses).  Respiratory: Negative for cough, chest tightness and shortness of breath.    Cardiovascular: Negative for chest pain, palpitations and leg swelling.  Gastrointestinal: Negative for abdominal pain, diarrhea, constipation and abdominal distention.  Genitourinary: Negative for urgency, frequency, decreased urine volume and difficulty urinating.  Musculoskeletal: Negative for back pain, arthralgias and gait problem.  Skin: Negative for color change, pallor and rash.  Neurological: Negative for dizziness, light-headedness, numbness and headaches.  Hematological: Negative for adenopathy. Does not bruise/bleed easily.  Psychiatric/Behavioral: Negative for suicidal ideas, confusion, sleep disturbance, self-injury, dysphoric mood, decreased concentration and agitation.       Objective:    BP 104/62 (BP Location: Right Arm, Patient Position: Sitting, Cuff Size: Normal)   Pulse 95   Temp 98.1 F (36.7 C) (Oral)   Ht 5\' 1"  (1.549 m)   Wt 151 lb (68.5 kg)   SpO2 95%   BMI 28.53 kg/m  General appearance: alert, cooperative, appears stated age and no distress Head: Normocephalic, without obvious abnormality, atraumatic Eyes: conjunctivae/corneas clear. PERRL, EOM's intact. Fundi benign. Ears: normal TM's and external ear canals both ears Nose: Nares normal. Septum midline. Mucosa normal. No drainage or sinus tenderness. Throat: lips, mucosa, and tongue normal; teeth and gums normal Neck: no  adenopathy, no carotid bruit, no JVD, supple, symmetrical, trachea midline and thyroid not enlarged, symmetric, no tenderness/mass/nodules Back: symmetric, no curvature. ROM normal. No CVA tenderness. Lungs: clear to auscultation bilaterally Breasts: normal appearance, no masses or tenderness Heart: regular rate and rhythm, S1, S2 normal, no murmur, click, rub or gallop Abdomen: soft, non-tender; bowel sounds normal; no masses,  no organomegaly Pelvic: external genitalia normal, no adnexal masses or tenderness, rectovaginal septum normal, uterus surgically absent, vagina normal  without discharge and vaginal smear done , rectal --heme neg brown stool Extremities: extremities normal, atraumatic, no cyanosis or edema Pulses: 2+ and symmetric Skin: + lesions on both elbows  Lymph nodes: Cervical, supraclavicular, and axillary nodes normal. Neurologic: Alert and oriented X 3, normal strength and tone. Normal symmetric reflexes. Normal coordination and gait    Assessment:    Healthy female exam.      Plan:    ghm utd Check labs See AVS See After Visit Summary for Counseling Recommendations   1. Rash  - Ambulatory referral to Dermatology - CBC with Differential/Platelet  2. DM (diabetes mellitus) type II, controlled, with peripheral vascular disorder ([place]) hgba1c to be done, minimize simple carbs. Increase exercise as tolerated. Continue current meds  - Lipid panel - Hemoglobin A1c - CBC with Differential/Platelet - Comprehensive metabolic panel - POCT Urinalysis Dipstick (Automated)  3. Hyperlipidemia LDL goal <70 Tolerating statin, encouraged heart healthy diet, avoid trans fats, minimize simple carbs and saturated fats. Increase exercise as tolerated - Lipid panel - Hemoglobin A1c - CBC with Differential/Platelet - Comprehensive metabolic panel - POCT Urinalysis Dipstick (Automated)  4. Preventative health care  See above - Lipid panel - Hemoglobin A1c - CBC with Differential/Platelet - Comprehensive metabolic panel - POCT Urinalysis Dipstick (Automated) - Cytology - PAP [place] - POCT Occult Blood Stool

## 2016-10-09 NOTE — Patient Instructions (Signed)
Preventive Care 40-64 Years, Female Preventive care refers to lifestyle choices and visits with your health care provider that can promote health and wellness. What does preventive care include?  A yearly physical exam. This is also called an annual well check.  Dental exams once or twice a year.  Routine eye exams. Ask your health care provider how often you should have your eyes checked.  Personal lifestyle choices, including: ? Daily care of your teeth and gums. ? Regular physical activity. ? Eating a healthy diet. ? Avoiding tobacco and drug use. ? Limiting alcohol use. ? Practicing safe sex. ? Taking low-dose aspirin daily starting at age 58. ? Taking vitamin and mineral supplements as recommended by your health care provider. What happens during an annual well check? The services and screenings done by your health care provider during your annual well check will depend on your age, overall health, lifestyle risk factors, and family history of disease. Counseling Your health care provider may ask you questions about your:  Alcohol use.  Tobacco use.  Drug use.  Emotional well-being.  Home and relationship well-being.  Sexual activity.  Eating habits.  Work and work Statistician.  Method of birth control.  Menstrual cycle.  Pregnancy history.  Screening You may have the following tests or measurements:  Height, weight, and BMI.  Blood pressure.  Lipid and cholesterol levels. These may be checked every 5 years, or more frequently if you are over 81 years old.  Skin check.  Lung cancer screening. You may have this screening every year starting at age 78 if you have a 30-pack-year history of smoking and currently smoke or have quit within the past 15 years.  Fecal occult blood test (FOBT) of the stool. You may have this test every year starting at age 65.  Flexible sigmoidoscopy or colonoscopy. You may have a sigmoidoscopy every 5 years or a colonoscopy  every 10 years starting at age 30.  Hepatitis C blood test.  Hepatitis B blood test.  Sexually transmitted disease (STD) testing.  Diabetes screening. This is done by checking your blood sugar (glucose) after you have not eaten for a while (fasting). You may have this done every 1-3 years.  Mammogram. This may be done every 1-2 years. Talk to your health care provider about when you should start having regular mammograms. This may depend on whether you have a family history of breast cancer.  BRCA-related cancer screening. This may be done if you have a family history of breast, ovarian, tubal, or peritoneal cancers.  Pelvic exam and Pap test. This may be done every 3 years starting at age 80. Starting at age 36, this may be done every 5 years if you have a Pap test in combination with an HPV test.  Bone density scan. This is done to screen for osteoporosis. You may have this scan if you are at high risk for osteoporosis.  Discuss your test results, treatment options, and if necessary, the need for more tests with your health care provider. Vaccines Your health care provider may recommend certain vaccines, such as:  Influenza vaccine. This is recommended every year.  Tetanus, diphtheria, and acellular pertussis (Tdap, Td) vaccine. You may need a Td booster every 10 years.  Varicella vaccine. You may need this if you have not been vaccinated.  Zoster vaccine. You may need this after age 5.  Measles, mumps, and rubella (MMR) vaccine. You may need at least one dose of MMR if you were born in  1955 or later. You may also need a second dose.  Pneumococcal 13-valent conjugate (PCV13) vaccine. You may need this if you have certain conditions and were not previously vaccinated.  Pneumococcal polysaccharide (PPSV23) vaccine. You may need one or two doses if you smoke cigarettes or if you have certain conditions.  Meningococcal vaccine. You may need this if you have certain  conditions.  Hepatitis A vaccine. You may need this if you have certain conditions or if you travel or work in places where you may be exposed to hepatitis A.  Hepatitis B vaccine. You may need this if you have certain conditions or if you travel or work in places where you may be exposed to hepatitis B.  Haemophilus influenzae type b (Hib) vaccine. You may need this if you have certain conditions.  Talk to your health care provider about which screenings and vaccines you need and how often you need them. This information is not intended to replace advice given to you by your health care provider. Make sure you discuss any questions you have with your health care provider. Document Released: 02/02/2015 Document Revised: 09/26/2015 Document Reviewed: 11/07/2014 Elsevier Interactive Patient Education  2017 [employer].

## 2016-10-14 LAB — CYTOLOGY - PAP: Diagnosis: NEGATIVE

## 2016-10-26 ENCOUNTER — Encounter: Payer: Self-pay | Admitting: Family Medicine

## 2016-10-26 DIAGNOSIS — I10 Essential (primary) hypertension: Secondary | ICD-10-CM

## 2016-10-27 MED ORDER — ATENOLOL 25 MG PO TABS: 25.0000 mg | ORAL_TABLET | Freq: Every day | ORAL | 1 refills | Status: DC

## 2016-10-27 MED ORDER — FENOFIBRATE 160 MG PO TABS: 160.0000 mg | ORAL_TABLET | Freq: Every day | ORAL | 1 refills | Status: DC

## 2016-10-27 MED ORDER — MONTELUKAST SODIUM 10 MG PO TABS: 10.0000 mg | ORAL_TABLET | Freq: Every day | ORAL | 1 refills | Status: DC

## 2016-10-27 MED ORDER — ATORVASTATIN CALCIUM 40 MG PO TABS: 40.0000 mg | ORAL_TABLET | Freq: Every day | ORAL | 1 refills | Status: DC

## 2016-11-28 ENCOUNTER — Ambulatory Visit: Payer: PRIVATE HEALTH INSURANCE | Admitting: Endocrinology

## 2016-12-09 ENCOUNTER — Encounter: Payer: Self-pay | Admitting: Endocrinology

## 2016-12-09 ENCOUNTER — Encounter: Payer: Self-pay | Admitting: Family Medicine

## 2016-12-09 DIAGNOSIS — IMO0002 Reserved for concepts with insufficient information to code with codable children: Secondary | ICD-10-CM

## 2016-12-09 DIAGNOSIS — E1151 Type 2 diabetes mellitus with diabetic peripheral angiopathy without gangrene: Secondary | ICD-10-CM

## 2016-12-09 DIAGNOSIS — E1165 Type 2 diabetes mellitus with hyperglycemia: Secondary | ICD-10-CM

## 2016-12-09 DIAGNOSIS — K219 Gastro-esophageal reflux disease without esophagitis: Secondary | ICD-10-CM

## 2016-12-09 DIAGNOSIS — I10 Essential (primary) hypertension: Secondary | ICD-10-CM

## 2016-12-15 MED ORDER — ESOMEPRAZOLE MAGNESIUM 40 MG PO CPDR: DELAYED_RELEASE_CAPSULE | ORAL | 1 refills | Status: DC

## 2016-12-15 MED ORDER — MONTELUKAST SODIUM 10 MG PO TABS: 10.0000 mg | ORAL_TABLET | Freq: Every day | ORAL | 1 refills | Status: DC

## 2016-12-15 MED ORDER — ATORVASTATIN CALCIUM 40 MG PO TABS: 40.0000 mg | ORAL_TABLET | Freq: Every day | ORAL | 1 refills | Status: DC

## 2016-12-15 MED ORDER — FENOFIBRATE 160 MG PO TABS: 160.0000 mg | ORAL_TABLET | Freq: Every day | ORAL | 1 refills | Status: DC

## 2016-12-15 MED ORDER — ATENOLOL 25 MG PO TABS: 25.0000 mg | ORAL_TABLET | Freq: Every day | ORAL | 1 refills | Status: DC

## 2017-01-01 ENCOUNTER — Encounter: Payer: Self-pay | Admitting: Endocrinology

## 2017-01-01 ENCOUNTER — Encounter: Payer: Self-pay | Admitting: Family Medicine

## 2017-01-01 DIAGNOSIS — I10 Essential (primary) hypertension: Secondary | ICD-10-CM

## 2017-01-01 DIAGNOSIS — J45901 Unspecified asthma with (acute) exacerbation: Secondary | ICD-10-CM

## 2017-01-01 DIAGNOSIS — K219 Gastro-esophageal reflux disease without esophagitis: Secondary | ICD-10-CM

## 2017-01-02 MED ORDER — ATORVASTATIN CALCIUM 40 MG PO TABS: 40.0000 mg | ORAL_TABLET | Freq: Every day | ORAL | 1 refills | Status: DC

## 2017-01-02 MED ORDER — IPRATROPIUM-ALBUTEROL 20-100 MCG/ACT IN AERS: 1.0000 | INHALATION_SPRAY | Freq: Four times a day (QID) | RESPIRATORY_TRACT | 5 refills | Status: DC | PRN

## 2017-01-02 MED ORDER — ATENOLOL 25 MG PO TABS: 25.0000 mg | ORAL_TABLET | Freq: Every day | ORAL | 1 refills | Status: DC

## 2017-01-02 MED ORDER — FENOFIBRATE 160 MG PO TABS: 160.0000 mg | ORAL_TABLET | Freq: Every day | ORAL | 1 refills | Status: DC

## 2017-01-02 MED ORDER — ESOMEPRAZOLE MAGNESIUM 40 MG PO CPDR: DELAYED_RELEASE_CAPSULE | ORAL | 1 refills | Status: DC

## 2017-01-02 MED ORDER — MONTELUKAST SODIUM 10 MG PO TABS: 10.0000 mg | ORAL_TABLET | Freq: Every day | ORAL | 1 refills | Status: DC

## 2017-01-06 ENCOUNTER — Other Ambulatory Visit: Payer: Self-pay

## 2017-01-06 ENCOUNTER — Encounter: Payer: Self-pay | Admitting: Endocrinology

## 2017-01-06 DIAGNOSIS — E1151 Type 2 diabetes mellitus with diabetic peripheral angiopathy without gangrene: Secondary | ICD-10-CM

## 2017-01-06 DIAGNOSIS — E1165 Type 2 diabetes mellitus with hyperglycemia: Principal | ICD-10-CM

## 2017-01-06 DIAGNOSIS — IMO0002 Reserved for concepts with insufficient information to code with codable children: Secondary | ICD-10-CM

## 2017-01-06 MED ORDER — SITAGLIP PHOS-METFORMIN HCL ER 50-1000 MG PO TB24: 2.0000 | ORAL_TABLET | Freq: Every day | ORAL | 3 refills | Status: DC

## 2017-01-06 MED ORDER — CANAGLIFLOZIN 300 MG PO TABS: 300.0000 mg | ORAL_TABLET | Freq: Every day | ORAL | 3 refills | Status: DC

## 2017-01-19 ENCOUNTER — Other Ambulatory Visit: Payer: Self-pay

## 2017-02-24 ENCOUNTER — Encounter: Payer: Self-pay | Admitting: Endocrinology

## 2017-02-24 ENCOUNTER — Ambulatory Visit: Payer: Self-pay | Admitting: Endocrinology

## 2017-02-24 VITALS — BP 128/76 | HR 80 | Wt 147.4 lb

## 2017-02-24 DIAGNOSIS — E119 Type 2 diabetes mellitus without complications: Secondary | ICD-10-CM

## 2017-02-24 LAB — POCT GLYCOSYLATED HEMOGLOBIN (HGB A1C): HEMOGLOBIN A1C: 5.3

## 2017-02-24 MED ORDER — FREESTYLE LIBRE 14 DAY READER DEVI: 1.0000 | Freq: Once | 0 refills | Status: DC

## 2017-02-24 MED ORDER — SITAGLIP PHOS-METFORMIN HCL ER 50-1000 MG PO TB24: 1.0000 | ORAL_TABLET | ORAL | 3 refills | Status: DC

## 2017-02-24 MED ORDER — FREESTYLE LIBRE 14 DAY SENSOR MISC: 1.0000 | 3 refills | Status: DC

## 2017-02-24 NOTE — Progress Notes (Signed)
Subjective:    Patient ID: Nicole Richard, female    DOB: 1955-06-15, 62 y.o.   MRN: [id number]  HPI Pt returns for f/u of diabetes mellitus: DM type: 2 Dx'ed: 3154 Complications: none Therapy: 4 oral meds GDM: never DKA: never Severe hypoglycemia: never.  Pancreatitis: never.  Other: she has never taken insulin.  Interval history: no cbg record, but states cbg's are well-controlled. pt states she feels well in general.  She feels improved cbg's are due to renewed dietary effort.   Past Medical History:  Diagnosis Date  . Allergy    seasonal  . Asthma   . GERD (gastroesophageal reflux disease)   . Hyperlipidemia   . Hypertension     Past Surgical History:  Procedure Laterality Date  . ABDOMINAL HYSTERECTOMY  08/2004  . APPENDECTOMY    . AUGMENTATION MAMMAPLASTY Bilateral 2000  . BREAST ENHANCEMENT SURGERY  2001  . COLONOSCOPY    . POLYPECTOMY  2009   sigmoid polyps  . SHOULDER ARTHROSCOPY W/ ROTATOR CUFF REPAIR Right 06/06/2015   [facility]  . TONSILLECTOMY    . TUBAL LIGATION      Social History   Socioeconomic History  . Marital status: Divorced    Spouse name: Not on file  . Number of children: 2  . Years of education: Not on file  . Highest education level: Not on file  Social Needs  . Financial resource strain: Not on file  . Food insecurity - worry: Not on file  . Food insecurity - inability: Not on file  . Transportation needs - medical: Not on file  . Transportation needs - non-medical: Not on file  Occupational History  . Occupation: Industrial/product designer  Tobacco Use  . Smoking status: Never Smoker  . Smokeless tobacco: Never Used  Substance and Sexual Activity  . Alcohol use: Yes    Comment: rare  . Drug use: No  . Sexual activity: Not Currently    Partners: Male  Other Topics Concern  . Not on file  Social History Narrative   Exercise--walking , and pt daily    Current Outpatient Medications on File Prior to Visit  Medication Sig  Dispense Refill  . aspirin 81 MG tablet Take 81 mg by mouth at bedtime.     [name] atenolol (TENORMIN) 25 MG tablet Take 1 tablet (25 mg total) by mouth daily. 90 tablet 1  . atorvastatin (LIPITOR) 40 MG tablet Take 1 tablet (40 mg total) by mouth at bedtime. 90 tablet 1  . bromocriptine (PARLODEL) 2.5 MG tablet 1/4 tab daily 24 tablet 3  . Budesonide 90 MCG/ACT inhaler 2 puffs twice daily. Rinse mouth out after use. 1 Inhaler 1  . Calcium Citrate-Vitamin D (CALCIUM + D PO) Take 1 tablet by mouth daily.     . canagliflozin (INVOKANA) 300 MG TABS tablet Take 1 tablet (300 mg total) by mouth daily. 90 tablet 3  . Cranberry 250 MG TABS Take 500 mg by mouth daily.     . diphenhydrAMINE (BENADRYL) 25 mg capsule Take 1 capsule (25 mg total) by mouth every 6 (six) hours as needed for itching. Continue regularly for 3 days, then as needed for recurrent rash or itching. 30 capsule 0  . esomeprazole (NEXIUM) 40 MG capsule TAKE ONE CAPSULE BY MOUTH TWICE A DAY BEFORE A MEAL 180 capsule 1  . fenofibrate 160 MG tablet Take 1 tablet (160 mg total) by mouth daily. 90 tablet 1  . Flaxseed, Linseed, (FLAXSEED OIL) 1200  MG CAPS Take 1 capsule by mouth daily.    [name] glucose blood (ONE TOUCH ULTRA TEST) test strip One touch ultra blue test strips. Check blood sugar twice daily E.119 200 each 4  . Ipratropium-Albuterol (COMBIVENT RESPIMAT) 20-100 MCG/ACT AERS respimat Inhale 1 puff into the lungs 4 (four) times daily as needed for wheezing or shortness of breath. 4 g 5  . mometasone (NASONEX) 50 MCG/ACT nasal spray Place 2 sprays into the nose daily. (Patient taking differently: Place 2 sprays into the nose daily as needed (congestion). ) 17 g 2  . montelukast (SINGULAIR) 10 MG tablet Take 1 tablet (10 mg total) by mouth daily. 90 tablet 1  . Multiple Vitamin (MULTIVITAMIN WITH MINERALS) TABS tablet Take 1 tablet by mouth daily.    [name] DELICA LANCETS 59D MISC Check Blood sugar twice daily. Dx:E11.9 (Patient taking  differently: Check Blood sugar once daily. Dx:E11.9) 100 each 12  . Probiotic Product (PROBIOTIC PO) Take 1 capsule by mouth at bedtime.    [name] Propylene Glycol (SYSTANE BALANCE) 0.6 % SOLN Apply 1 drop to eye as needed.     No current facility-administered medications on file prior to visit.     Allergies  Allergen Reactions  . Contrast Media [Iodinated Diagnostic Agents] Anaphylaxis  . Metamucil [Psyllium] Anaphylaxis  . Mustard Seed Anaphylaxis    Family History  Problem Relation Age of Onset  . Rheumatic fever Mother   . Irritable bowel syndrome Mother   . Breast cancer Mother   . Diabetes Father   . Colon polyps Father   . Kidney disease Father   . Coronary artery disease Unknown   . Hyperlipidemia Unknown   . Hypertension Unknown   . Asthma Unknown   . Cervical cancer Maternal Grandmother   . Breast cancer Maternal Grandmother   . Colon cancer Maternal Grandmother   . Uterine cancer Maternal Grandmother   . Esophageal cancer Neg Hx   . Rectal cancer Neg Hx   . Stomach cancer Neg Hx     BP 128/76 (BP Location: Left Arm, Patient Position: Sitting, Cuff Size: Normal)   Pulse 80   Wt 147 lb 6.4 oz (66.9 kg)   SpO2 95%   BMI 27.85 kg/m    Review of Systems She denies hypoglycemia    Objective:   Physical Exam VITAL SIGNS:  See vs page GENERAL: no distress Pulses: dorsalis pedis intact bilat.   MSK: no deformity of the feet CV: no leg edema Skin:  no ulcer on the feet.  normal color and temp on the feet. Neuro: sensation is intact to touch on the feet Ext: There is bilateral onychomycosis of the toenails.    Lab Results  Component Value Date   HGBA1C 5.3 02/24/2017      Assessment & Plan:  Type 2 DM: overcontrolled.   Patient Instructions  check your blood sugar once a day.  vary the time of day when you check, between before the 3 meals, and at bedtime.  also check if you have symptoms of your blood sugar being too high or too low.  please keep a  record of the readings and bring it to your next appointment here.  You can write it on any piece of paper.  please call us sooner if your blood sugar goes below 70, or if you have a lot of readings over 200.  Please reduce the janumet to 1 pill per day, and: continue the same other diabetes medications.  Please  come back for a follow-up appointment in 3 months.

## 2017-02-24 NOTE — Patient Instructions (Addendum)
check your blood sugar once a day.  vary the time of day when you check, between before the 3 meals, and at bedtime.  also check if you have symptoms of your blood sugar being too high or too low.  please keep a record of the readings and bring it to your next appointment here.  You can write it on any piece of paper.  please call us sooner if your blood sugar goes below 70, or if you have a lot of readings over 200.  Please reduce the janumet to 1 pill per day, and: continue the same other diabetes medications.  Please come back for a follow-up appointment in 3 months.

## 2017-03-10 LAB — HM DIABETES EYE EXAM

## 2017-03-11 ENCOUNTER — Encounter: Payer: Self-pay | Admitting: *Deleted

## 2017-03-27 ENCOUNTER — Other Ambulatory Visit: Payer: Self-pay | Admitting: Endocrinology

## 2017-04-09 ENCOUNTER — Encounter: Payer: Self-pay | Admitting: Family Medicine

## 2017-04-09 ENCOUNTER — Ambulatory Visit: Payer: BLUE CROSS/BLUE SHIELD | Admitting: Family Medicine

## 2017-04-09 VITALS — BP 128/78 | HR 74 | Temp 98.4°F | Resp 16 | Ht 61.0 in | Wt 144.6 lb

## 2017-04-09 DIAGNOSIS — E785 Hyperlipidemia, unspecified: Secondary | ICD-10-CM

## 2017-04-09 DIAGNOSIS — I10 Essential (primary) hypertension: Secondary | ICD-10-CM | POA: Diagnosis not present

## 2017-04-09 LAB — LIPID PANEL
CHOL/HDL RATIO: 5
Cholesterol: 310 mg/dL — ABNORMAL HIGH (ref 0–200)
HDL: 66.8 mg/dL (ref >39.00)
LDL Cholesterol: 219 mg/dL — ABNORMAL HIGH (ref 0–99)
NonHDL: 243.1
Triglycerides: 119 mg/dL (ref 0.0–149.0)
VLDL: 23.8 mg/dL (ref 0.0–40.0)

## 2017-04-09 LAB — COMPREHENSIVE METABOLIC PANEL
ALBUMIN: 4.7 g/dL (ref 3.5–5.2)
ALK PHOS: 90 U/L (ref 39–117)
ALT: 27 U/L (ref 0–35)
AST: 22 U/L (ref 0–37)
BILIRUBIN TOTAL: 0.7 mg/dL (ref 0.2–1.2)
BUN: 21 mg/dL (ref 6–23)
CO2: 29 meq/L (ref 19–32)
CREATININE: 0.77 mg/dL (ref 0.40–1.20)
Calcium: 10.3 mg/dL (ref 8.4–10.5)
Chloride: 103 mEq/L (ref 96–112)
GFR: 81.35 mL/min (ref >60.00)
Glucose, Bld: 106 mg/dL — ABNORMAL HIGH (ref 70–99)
Potassium: 4.7 mEq/L (ref 3.5–5.1)
Sodium: 140 mEq/L (ref 135–145)
TOTAL PROTEIN: 7.4 g/dL (ref 6.0–8.3)

## 2017-04-09 NOTE — Patient Instructions (Signed)

## 2017-04-09 NOTE — Assessment & Plan Note (Signed)
Tolerating statin, encouraged heart healthy diet, avoid trans fats, minimize simple carbs and saturated fats. Increase exercise as tolerated 

## 2017-04-09 NOTE — Progress Notes (Signed)
Patient ID: Nicole Richard, female   DOB: [birth year], 62 y.o.   MRN: [id number]     Subjective:  I acted as a Education administrator for Dr. Carollee Richard.  Nicole Richard, Nicole Richard   Patient ID: Nicole Richard, female    DOB: 1955/02/19, 62 y.o.   MRN: [id number]  Chief Complaint  Patient presents with  . Hypertension  . Hyperlipidemia    HPI  Patient is in today for follow up blood pressure and cholesterol.  She is doing well on current treatment.  She would like to talk about decreasing medication because she has had some weight loss.   She is just watching her quality of foods and quantity  Patient Care Team: Nicole Richard, Nicole Apa, DO as PCP - General Nicole Shin, MD as Consulting Physician (Endocrinology) Nicole Castle, MD (Inactive) as Consulting Physician (Gastroenterology)   Past Medical History:  Diagnosis Date  . Allergy    seasonal  . Asthma   . GERD (gastroesophageal reflux disease)   . Hyperlipidemia   . Hypertension     Past Surgical History:  Procedure Laterality Date  . ABDOMINAL HYSTERECTOMY  08/2004  . APPENDECTOMY    . AUGMENTATION MAMMAPLASTY Bilateral 2000  . BREAST ENHANCEMENT SURGERY  2001  . COLONOSCOPY    . POLYPECTOMY  2009   sigmoid polyps  . SHOULDER ARTHROSCOPY W/ ROTATOR CUFF REPAIR Right 06/06/2015   [facility]  . TONSILLECTOMY    . TUBAL LIGATION      Family History  Problem Relation Age of Onset  . Rheumatic fever Mother   . Irritable bowel syndrome Mother   . Breast cancer Mother   . Diabetes Father   . Colon polyps Father   . Kidney disease Father   . Coronary artery disease Unknown   . Hyperlipidemia Unknown   . Hypertension Unknown   . Asthma Unknown   . Cervical cancer Maternal Grandmother   . Breast cancer Maternal Grandmother   . Colon cancer Maternal Grandmother   . Uterine cancer Maternal Grandmother   . Esophageal cancer Neg Hx   . Rectal cancer Neg Hx   . Stomach cancer Neg Hx     Social History   Socioeconomic History  . Marital  status: Divorced    Spouse name: Not on file  . Number of children: 2  . Years of education: Not on file  . Highest education level: Not on file  Occupational History  . Occupation: Industrial/product designer  Social Needs  . Financial resource strain: Not on file  . Food insecurity:    Worry: Not on file    Inability: Not on file  . Transportation needs:    Medical: Not on file    Non-medical: Not on file  Tobacco Use  . Smoking status: Never Smoker  . Smokeless tobacco: Never Used  Substance and Sexual Activity  . Alcohol use: Yes    Comment: rare  . Drug use: No  . Sexual activity: Not Currently    Partners: Male  Lifestyle  . Physical activity:    Days per week: Not on file    Minutes per session: Not on file  . Stress: Not on file  Relationships  . Social connections:    Talks on phone: Not on file    Gets together: Not on file    Attends religious service: Not on file    Active member of club or organization: Not on file    Attends meetings of clubs or organizations: Not  on file    Relationship status: Not on file  . Intimate partner violence:    Fear of current or ex partner: Not on file    Emotionally abused: Not on file    Physically abused: Not on file    Forced sexual activity: Not on file  Other Topics Concern  . Not on file  Social History Narrative   Exercise--walking , and pt daily    Outpatient Medications Prior to Visit  Medication Sig Dispense Refill  . aspirin 81 MG tablet Take 81 mg by mouth at bedtime.     [name] atenolol (TENORMIN) 25 MG tablet Take 1 tablet (25 mg total) by mouth daily. 90 tablet 1  . atorvastatin (LIPITOR) 40 MG tablet Take 1 tablet (40 mg total) by mouth at bedtime. 90 tablet 1  . bromocriptine (PARLODEL) 2.5 MG tablet 1/4 tab daily 24 tablet 3  . Calcium Citrate-Vitamin D (CALCIUM + D PO) Take 1 tablet by mouth daily.     . canagliflozin (INVOKANA) 300 MG TABS tablet Take 1 tablet (300 mg total) by mouth daily. 90 tablet 3  .  Continuous Blood Gluc Receiver (FREESTYLE LIBRE 14 DAY READER) DEVI USE AS DIRECTED 1 Device 0  . Continuous Blood Gluc Sensor (FREESTYLE LIBRE 14 DAY SENSOR) MISC 1 Device by Does not apply route every 14 (fourteen) days. 6 each 3  . esomeprazole (NEXIUM) 40 MG capsule TAKE ONE CAPSULE BY MOUTH TWICE A DAY BEFORE A MEAL 180 capsule 1  . fenofibrate 160 MG tablet Take 1 tablet (160 mg total) by mouth daily. 90 tablet 1  . glucose blood (ONE TOUCH ULTRA TEST) test strip One touch ultra blue test strips. Check blood sugar twice daily E.119 200 each 4  . Ipratropium-Albuterol (COMBIVENT RESPIMAT) 20-100 MCG/ACT AERS respimat Inhale 1 puff into the lungs 4 (four) times daily as needed for wheezing or shortness of breath. 4 g 5  . mometasone (NASONEX) 50 MCG/ACT nasal spray Place 2 sprays into the nose daily. (Patient taking differently: Place 2 sprays into the nose daily as needed (congestion). ) 17 g 2  . montelukast (SINGULAIR) 10 MG tablet Take 1 tablet (10 mg total) by mouth daily. 90 tablet 1  . Multiple Vitamin (MULTIVITAMIN WITH MINERALS) TABS tablet Take 1 tablet by mouth daily.    [name] DELICA LANCETS 85Y MISC Check Blood sugar twice daily. Dx:E11.9 (Patient taking differently: Check Blood sugar once daily. Dx:E11.9) 100 each 12  . Probiotic Product (PROBIOTIC PO) Take 1 capsule by mouth at bedtime.    [name] Propylene Glycol (SYSTANE BALANCE) 0.6 % SOLN Apply 1 drop to eye as needed.    . SitaGLIPtin-MetFORMIN HCl (JANUMET XR) 50-1000 MG TB24 Take 1 tablet by mouth every morning. 90 tablet 3  . Budesonide 90 MCG/ACT inhaler 2 puffs twice daily. Rinse mouth out after use. 1 Inhaler 1  . Cranberry 250 MG TABS Take 500 mg by mouth daily.     . diphenhydrAMINE (BENADRYL) 25 mg capsule Take 1 capsule (25 mg total) by mouth every 6 (six) hours as needed for itching. Continue regularly for 3 days, then as needed for recurrent rash or itching. 30 capsule 0  . Flaxseed, Linseed, (FLAXSEED OIL) 1200 MG  CAPS Take 1 capsule by mouth daily.     No facility-administered medications prior to visit.     Allergies  Allergen Reactions  . Contrast Media [Iodinated Diagnostic Agents] Anaphylaxis  . Metamucil [Psyllium] Anaphylaxis  . Mustard Seed Anaphylaxis  Review of Systems  Constitutional: Negative for fever and malaise/fatigue.  HENT: Negative for congestion.   Eyes: Negative for blurred vision.  Respiratory: Negative for cough and shortness of breath.   Cardiovascular: Negative for chest pain, palpitations and leg swelling.  Gastrointestinal: Negative for vomiting.  Musculoskeletal: Negative for back pain.  Skin: Negative for rash.  Neurological: Negative for loss of consciousness and headaches.       Objective:    Physical Exam  Constitutional: She is oriented to person, place, and time. She appears well-developed and well-nourished.  HENT:  Head: Normocephalic and atraumatic.  Eyes: Conjunctivae and EOM are normal.  Neck: Normal range of motion. Neck supple. No JVD present. Carotid bruit is not present. No thyromegaly present.  Cardiovascular: Normal rate, regular rhythm and normal heart sounds.  No murmur heard. Pulmonary/Chest: Effort normal and breath sounds normal. No respiratory distress. She has no wheezes. She has no rales. She exhibits no tenderness.  Musculoskeletal: She exhibits no edema.  Neurological: She is alert and oriented to person, place, and time.  Psychiatric: She has a normal mood and affect.  Nursing note and vitals reviewed.   BP 128/78 (BP Location: Right Arm, Cuff Size: Normal)   Pulse 74   Temp 98.4 F (36.9 C) (Oral)   Resp 16   Ht 5\' 1"  (1.549 m)   Wt 144 lb 9.6 oz (65.6 kg)   SpO2 97%   BMI 27.32 kg/m  Wt Readings from Last 3 Encounters:  04/09/17 144 lb 9.6 oz (65.6 kg)  02/24/17 147 lb 6.4 oz (66.9 kg)  10/09/16 151 lb (68.5 kg)   BP Readings from Last 3 Encounters:  04/09/17 128/78  02/24/17 128/76  10/09/16 104/62      Immunization History  Administered Date(s) Administered  . Influenza Whole 11/03/2006, 11/01/2008  . Influenza,inj,Quad PF,6+ Mos 10/04/2015  . Influenza-Unspecified 10/20/2012  . Pneumococcal Polysaccharide-23 11/03/2006, 10/02/2014  . Td 11/29/2001  . Tdap 10/18/2012  . Zoster 10/05/2009    Health Maintenance  Topic Date Due  . URINE MICROALBUMIN  10/03/2016  . COLONOSCOPY  08/16/2017  . HEMOGLOBIN A1C  08/24/2017  . MAMMOGRAM  09/15/2017  . FOOT EXAM  02/24/2018  . OPHTHALMOLOGY EXAM  03/10/2018  . PAP SMEAR  10/10/2019  . TETANUS/TDAP  10/19/2022  . PNEUMOCOCCAL POLYSACCHARIDE VACCINE  Completed  . INFLUENZA VACCINE  Completed  . Hepatitis C Screening  Completed  . HIV Screening  Completed    Lab Results  Component Value Date   WBC 6.6 10/09/2016   HGB 14.5 10/09/2016   HCT 44.4 10/09/2016   PLT 313.0 10/09/2016   GLUCOSE 96 10/09/2016   CHOL 140 10/09/2016   TRIG 68.0 10/09/2016   HDL 57.40 10/09/2016   LDLDIRECT 128.1 10/05/2013   LDLCALC 69 10/09/2016   ALT 19 10/09/2016   AST 20 10/09/2016   NA 140 10/09/2016   K 4.4 10/09/2016   CL 105 10/09/2016   CREATININE 0.86 10/09/2016   BUN 20 10/09/2016   CO2 31 10/09/2016   TSH 0.96 10/04/2015   HGBA1C 5.3 02/24/2017   MICROALBUR <0.7 10/04/2015    Lab Results  Component Value Date   TSH 0.96 10/04/2015   Lab Results  Component Value Date   WBC 6.6 10/09/2016   HGB 14.5 10/09/2016   HCT 44.4 10/09/2016   MCV 90.3 10/09/2016   PLT 313.0 10/09/2016   Lab Results  Component Value Date   NA 140 10/09/2016   K 4.4 10/09/2016  CO2 31 10/09/2016   GLUCOSE 96 10/09/2016   BUN 20 10/09/2016   CREATININE 0.86 10/09/2016   BILITOT 0.7 10/09/2016   ALKPHOS 69 10/09/2016   AST 20 10/09/2016   ALT 19 10/09/2016   PROT 7.0 10/09/2016   ALBUMIN 4.4 10/09/2016   CALCIUM 10.1 10/09/2016   GFR 71.73 10/09/2016   Lab Results  Component Value Date   CHOL 140 10/09/2016   Lab Results  Component  Value Date   HDL 57.40 10/09/2016   Lab Results  Component Value Date   LDLCALC 69 10/09/2016   Lab Results  Component Value Date   TRIG 68.0 10/09/2016   Lab Results  Component Value Date   CHOLHDL 2 10/09/2016   Lab Results  Component Value Date   HGBA1C 5.3 02/24/2017         Assessment & Plan:   Problem List Items Addressed This Visit      Unprioritized   Essential hypertension    Well controlled, no changes to meds. Encouraged heart healthy diet such as the DASH diet and exercise as tolerated.       Relevant Orders   Lipid panel   Lipid panel   Comprehensive metabolic panel   Hyperlipidemia    Tolerating statin, encouraged heart healthy diet, avoid trans fats, minimize simple carbs and saturated fats. Increase exercise as tolerated      Relevant Orders   Lipid panel   Comprehensive metabolic panel    Other Visit Diagnoses    Hyperlipidemia LDL goal <70    -  Primary   Relevant Orders   Comprehensive metabolic panel   Lipid panel   Comprehensive metabolic panel      I have discontinued Waverley A. Zink's Flaxseed Oil, Cranberry, diphenhydrAMINE, and Budesonide. I am also having her maintain her Propylene Glycol, aspirin, multivitamin with minerals, Calcium Citrate-Vitamin D (CALCIUM + D PO), ONETOUCH DELICA LANCETS 84X, mometasone, Probiotic Product (PROBIOTIC PO), bromocriptine, glucose blood, montelukast, Ipratropium-Albuterol, fenofibrate, esomeprazole, atorvastatin, atenolol, canagliflozin, SitaGLIPtin-MetFORMIN HCl, FREESTYLE LIBRE 14 DAY SENSOR, and FREESTYLE LIBRE 14 DAY READER.  No orders of the defined types were placed in this encounter.   CMA served as Education administrator during this visit. History, Physical and Plan performed by medical provider. Documentation and orders reviewed and attested to.  [name], DO

## 2017-04-09 NOTE — Assessment & Plan Note (Signed)
Well controlled, no changes to meds. Encouraged heart healthy diet such as the DASH diet and exercise as tolerated.  °

## 2017-05-25 ENCOUNTER — Ambulatory Visit: Payer: PRIVATE HEALTH INSURANCE | Admitting: Endocrinology

## 2017-05-25 ENCOUNTER — Encounter: Payer: Self-pay | Admitting: Endocrinology

## 2017-05-25 VITALS — BP 108/60 | HR 66 | Ht 61.0 in | Wt 145.0 lb

## 2017-05-25 DIAGNOSIS — E119 Type 2 diabetes mellitus without complications: Secondary | ICD-10-CM

## 2017-05-25 LAB — POCT GLYCOSYLATED HEMOGLOBIN (HGB A1C): Hemoglobin A1C: 5.3

## 2017-05-25 MED ORDER — METFORMIN HCL ER 500 MG PO TB24: 2000.0000 mg | ORAL_TABLET | Freq: Every day | ORAL | 3 refills | Status: DC

## 2017-05-25 NOTE — Patient Instructions (Addendum)
check your blood sugar once a day.  vary the time of day when you check, between before the 3 meals, and at bedtime.  also check if you have symptoms of your blood sugar being too high or too low.  please keep a record of the readings and bring it to your next appointment here.  You can write it on any piece of paper.  please call us sooner if your blood sugar goes below 70, or if you have a lot of readings over 200.  Please change the janumet to metformin only.  I have sent a prescription to your pharmacy, and: continue the same other diabetes medications.  Please come back for a follow-up appointment in 4 months.

## 2017-05-25 NOTE — Progress Notes (Signed)
Subjective:    Patient ID: Nicole Richard, female    DOB: 02-11-1955, 62 y.o.   MRN: [id number]  HPI Pt returns for f/u of diabetes mellitus: DM type: 2 Dx'ed: 2671 Complications: none Therapy: 4 oral meds.  GDM: never DKA: never Severe hypoglycemia: never.  Pancreatitis: never.  Other: she has never taken insulin.  Interval history: we downloaded continuous glucose monitor data and reviewed together.  All are approx 100.  pt states she feels well in general. Past Medical History:  Diagnosis Date  . Allergy    seasonal  . Asthma   . GERD (gastroesophageal reflux disease)   . Hyperlipidemia   . Hypertension     Past Surgical History:  Procedure Laterality Date  . ABDOMINAL HYSTERECTOMY  08/2004  . APPENDECTOMY    . AUGMENTATION MAMMAPLASTY Bilateral 2000  . BREAST ENHANCEMENT SURGERY  2001  . COLONOSCOPY    . POLYPECTOMY  2009   sigmoid polyps  . SHOULDER ARTHROSCOPY W/ ROTATOR CUFF REPAIR Right 06/06/2015   [facility]  . TONSILLECTOMY    . TUBAL LIGATION      Social History   Socioeconomic History  . Marital status: Divorced    Spouse name: Not on file  . Number of children: 2  . Years of education: Not on file  . Highest education level: Not on file  Occupational History  . Occupation: Industrial/product designer  Social Needs  . Financial resource strain: Not on file  . Food insecurity:    Worry: Not on file    Inability: Not on file  . Transportation needs:    Medical: Not on file    Non-medical: Not on file  Tobacco Use  . Smoking status: Never Smoker  . Smokeless tobacco: Never Used  Substance and Sexual Activity  . Alcohol use: Yes    Comment: rare  . Drug use: No  . Sexual activity: Not Currently    Partners: Male  Lifestyle  . Physical activity:    Days per week: Not on file    Minutes per session: Not on file  . Stress: Not on file  Relationships  . Social connections:    Talks on phone: Not on file    Gets together: Not on file    Attends  religious service: Not on file    Active member of club or organization: Not on file    Attends meetings of clubs or organizations: Not on file    Relationship status: Not on file  . Intimate partner violence:    Fear of current or ex partner: Not on file    Emotionally abused: Not on file    Physically abused: Not on file    Forced sexual activity: Not on file  Other Topics Concern  . Not on file  Social History Narrative   Exercise--walking , and pt daily    Current Outpatient Medications on File Prior to Visit  Medication Sig Dispense Refill  . aspirin 81 MG tablet Take 81 mg by mouth at bedtime.     [name] atenolol (TENORMIN) 25 MG tablet Take 1 tablet (25 mg total) by mouth daily. 90 tablet 1  . atorvastatin (LIPITOR) 40 MG tablet Take 1 tablet (40 mg total) by mouth at bedtime. 90 tablet 1  . bromocriptine (PARLODEL) 2.5 MG tablet 1/4 tab daily 24 tablet 3  . Calcium Citrate-Vitamin D (CALCIUM + D PO) Take 1 tablet by mouth daily.     . canagliflozin (INVOKANA) 300 MG TABS  tablet Take 1 tablet (300 mg total) by mouth daily. 90 tablet 3  . Continuous Blood Gluc Receiver (FREESTYLE LIBRE 14 DAY READER) DEVI USE AS DIRECTED 1 Device 0  . Continuous Blood Gluc Sensor (FREESTYLE LIBRE 14 DAY SENSOR) MISC 1 Device by Does not apply route every 14 (fourteen) days. 6 each 3  . esomeprazole (NEXIUM) 40 MG capsule TAKE ONE CAPSULE BY MOUTH TWICE A DAY BEFORE A MEAL 180 capsule 1  . fenofibrate 160 MG tablet Take 1 tablet (160 mg total) by mouth daily. 90 tablet 1  . glucose blood (ONE TOUCH ULTRA TEST) test strip One touch ultra blue test strips. Check blood sugar twice daily E.119 200 each 4  . Ipratropium-Albuterol (COMBIVENT RESPIMAT) 20-100 MCG/ACT AERS respimat Inhale 1 puff into the lungs 4 (four) times daily as needed for wheezing or shortness of breath. 4 g 5  . mometasone (NASONEX) 50 MCG/ACT nasal spray Place 2 sprays into the nose daily. (Patient taking differently: Place 2 sprays into  the nose daily as needed (congestion). ) 17 g 2  . montelukast (SINGULAIR) 10 MG tablet Take 1 tablet (10 mg total) by mouth daily. 90 tablet 1  . Multiple Vitamin (MULTIVITAMIN WITH MINERALS) TABS tablet Take 1 tablet by mouth daily.    [name] DELICA LANCETS 76H MISC Check Blood sugar twice daily. Dx:E11.9 (Patient taking differently: Check Blood sugar once daily. Dx:E11.9) 100 each 12  . Probiotic Product (PROBIOTIC PO) Take 1 capsule by mouth at bedtime.    [name] Propylene Glycol (SYSTANE BALANCE) 0.6 % SOLN Apply 1 drop to eye as needed.     No current facility-administered medications on file prior to visit.     Allergies  Allergen Reactions  . Contrast Media [Iodinated Diagnostic Agents] Anaphylaxis  . Metamucil [Psyllium] Anaphylaxis  . Mustard Seed Anaphylaxis    Family History  Problem Relation Age of Onset  . Rheumatic fever Mother   . Irritable bowel syndrome Mother   . Breast cancer Mother   . Diabetes Father   . Colon polyps Father   . Kidney disease Father   . Coronary artery disease Unknown   . Hyperlipidemia Unknown   . Hypertension Unknown   . Asthma Unknown   . Cervical cancer Maternal Grandmother   . Breast cancer Maternal Grandmother   . Colon cancer Maternal Grandmother   . Uterine cancer Maternal Grandmother   . Esophageal cancer Neg Hx   . Rectal cancer Neg Hx   . Stomach cancer Neg Hx     BP 108/60 (BP Location: Left Arm, Patient Position: Sitting, Cuff Size: Normal)   Pulse 66   Ht 5\' 1"  (1.549 m)   Wt 145 lb (65.8 kg)   SpO2 96%   BMI 27.40 kg/m    Review of Systems She has lost 2 more lbs.     Objective:   Physical Exam VITAL SIGNS:  See vs page GENERAL: no distress Pulses: dorsalis pedis intact bilat.   MSK: no deformity of the feet CV: no leg edema.   Skin:  no ulcer on the feet.  normal color and temp on the feet.  Neuro: sensation is intact to touch on the feet.  Ext: There is bilateral onychomycosis of the toenails.    A1c=5.3%    Assessment & Plan:  Type 2 DM: overcontrolled.  Patient Instructions  check your blood sugar once a day.  vary the time of day when you check, between before the 3 meals, and at  bedtime.  also check if you have symptoms of your blood sugar being too high or too low.  please keep a record of the readings and bring it to your next appointment here.  You can write it on any piece of paper.  please call us sooner if your blood sugar goes below 70, or if you have a lot of readings over 200.  Please change the janumet to metformin only.  I have sent a prescription to your pharmacy, and: continue the same other diabetes medications.  Please come back for a follow-up appointment in 4 months.

## 2017-06-25 ENCOUNTER — Other Ambulatory Visit: Payer: Self-pay | Admitting: Family Medicine

## 2017-06-25 ENCOUNTER — Encounter: Payer: Self-pay | Admitting: Gastroenterology

## 2017-06-25 DIAGNOSIS — Z1231 Encounter for screening mammogram for malignant neoplasm of breast: Secondary | ICD-10-CM

## 2017-07-04 ENCOUNTER — Other Ambulatory Visit: Payer: Self-pay | Admitting: Family Medicine

## 2017-07-04 DIAGNOSIS — K219 Gastro-esophageal reflux disease without esophagitis: Secondary | ICD-10-CM

## 2017-07-04 DIAGNOSIS — I10 Essential (primary) hypertension: Secondary | ICD-10-CM

## 2017-07-06 ENCOUNTER — Other Ambulatory Visit (INDEPENDENT_AMBULATORY_CARE_PROVIDER_SITE_OTHER): Payer: PRIVATE HEALTH INSURANCE

## 2017-07-06 DIAGNOSIS — I10 Essential (primary) hypertension: Secondary | ICD-10-CM

## 2017-07-06 DIAGNOSIS — E785 Hyperlipidemia, unspecified: Secondary | ICD-10-CM

## 2017-07-06 LAB — LIPID PANEL
CHOL/HDL RATIO: 2
Cholesterol: 159 mg/dL (ref 0–200)
HDL: 63.6 mg/dL (ref >39.00)
LDL Cholesterol: 82 mg/dL (ref 0–99)
NonHDL: 94.92
TRIGLYCERIDES: 67 mg/dL (ref 0.0–149.0)
VLDL: 13.4 mg/dL (ref 0.0–40.0)

## 2017-07-06 LAB — COMPREHENSIVE METABOLIC PANEL
ALT: 21 U/L (ref 0–35)
AST: 17 U/L (ref 0–37)
Albumin: 4.5 g/dL (ref 3.5–5.2)
Alkaline Phosphatase: 60 U/L (ref 39–117)
BUN: 24 mg/dL — ABNORMAL HIGH (ref 6–23)
CALCIUM: 10.5 mg/dL (ref 8.4–10.5)
CHLORIDE: 104 meq/L (ref 96–112)
CO2: 30 meq/L (ref 19–32)
Creatinine, Ser: 0.96 mg/dL (ref 0.40–1.20)
GFR: 63.02 mL/min (ref >60.00)
Glucose, Bld: 98 mg/dL (ref 70–99)
Potassium: 5.6 mEq/L — ABNORMAL HIGH (ref 3.5–5.1)
Sodium: 141 mEq/L (ref 135–145)
Total Bilirubin: 0.8 mg/dL (ref 0.2–1.2)
Total Protein: 7 g/dL (ref 6.0–8.3)

## 2017-07-07 ENCOUNTER — Other Ambulatory Visit: Payer: Self-pay | Admitting: Endocrinology

## 2017-09-09 ENCOUNTER — Ambulatory Visit
Admission: RE | Admit: 2017-09-09 | Discharge: 2017-09-09 | Disposition: A | Discharge status: Home / Self Care | Payer: 59 | Source: Ambulatory Visit | Attending: Family Medicine | Admitting: Family Medicine

## 2017-09-09 DIAGNOSIS — Z1231 Encounter for screening mammogram for malignant neoplasm of breast: Secondary | ICD-10-CM

## 2017-09-25 ENCOUNTER — Encounter: Payer: Self-pay | Admitting: Endocrinology

## 2017-09-25 ENCOUNTER — Ambulatory Visit (INDEPENDENT_AMBULATORY_CARE_PROVIDER_SITE_OTHER): Payer: 59 | Admitting: Endocrinology

## 2017-09-25 VITALS — BP 112/68 | HR 61 | Ht 61.0 in | Wt 146.4 lb

## 2017-09-25 DIAGNOSIS — E1151 Type 2 diabetes mellitus with diabetic peripheral angiopathy without gangrene: Secondary | ICD-10-CM

## 2017-09-25 DIAGNOSIS — E1165 Type 2 diabetes mellitus with hyperglycemia: Secondary | ICD-10-CM | POA: Diagnosis not present

## 2017-09-25 DIAGNOSIS — E119 Type 2 diabetes mellitus without complications: Secondary | ICD-10-CM

## 2017-09-25 DIAGNOSIS — IMO0002 Reserved for concepts with insufficient information to code with codable children: Secondary | ICD-10-CM

## 2017-09-25 LAB — POCT GLYCOSYLATED HEMOGLOBIN (HGB A1C): HEMOGLOBIN A1C: 5.6 % (ref 4.0–5.6)

## 2017-09-25 MED ORDER — FREESTYLE LIBRE 14 DAY SENSOR MISC: 1.0000 | 3 refills | Status: DC

## 2017-09-25 MED ORDER — SITAGLIP PHOS-METFORMIN HCL ER 50-1000 MG PO TB24: 1.0000 | ORAL_TABLET | Freq: Every day | ORAL | 3 refills | Status: DC

## 2017-09-25 MED ORDER — BROMOCRIPTINE MESYLATE 2.5 MG PO TABS: ORAL_TABLET | ORAL | 3 refills | Status: DC

## 2017-09-25 MED ORDER — CANAGLIFLOZIN 300 MG PO TABS: 300.0000 mg | ORAL_TABLET | Freq: Every day | ORAL | 3 refills | Status: DC

## 2017-09-25 NOTE — Progress Notes (Signed)
Subjective:    Patient ID: Nicole Richard, female    DOB: [birth year], 62 y.o.   MRN: [id number]  HPI Pt returns for f/u of diabetes mellitus: DM type: 2 Dx'ed: 6213 Complications: none Therapy: 4 oral meds.  GDM: never DKA: never Severe hypoglycemia: never.  Pancreatitis: never.  Other: she has never taken insulin.  Interval history: pt states she feels well in general.  she wants to go back to the janumet.  She takes meds as rx'ed.  She says cbg's are in the mid-100's.   Past Medical History:  Diagnosis Date  . Allergy    seasonal  . Asthma   . GERD (gastroesophageal reflux disease)   . Hyperlipidemia   . Hypertension     Past Surgical History:  Procedure Laterality Date  . ABDOMINAL HYSTERECTOMY  08/2004  . APPENDECTOMY    . AUGMENTATION MAMMAPLASTY Bilateral 2000  . BREAST ENHANCEMENT SURGERY  2001  . COLONOSCOPY    . POLYPECTOMY  2009   sigmoid polyps  . SHOULDER ARTHROSCOPY W/ ROTATOR CUFF REPAIR Right 06/06/2015   [facility]  . TONSILLECTOMY    . TUBAL LIGATION      Social History   Socioeconomic History  . Marital status: Divorced    Spouse name: Not on file  . Number of children: 2  . Years of education: Not on file  . Highest education level: Not on file  Occupational History  . Occupation: Industrial/product designer  Social Needs  . Financial resource strain: Not on file  . Food insecurity:    Worry: Not on file    Inability: Not on file  . Transportation needs:    Medical: Not on file    Non-medical: Not on file  Tobacco Use  . Smoking status: Never Smoker  . Smokeless tobacco: Never Used  Substance and Sexual Activity  . Alcohol use: Yes    Comment: rare  . Drug use: No  . Sexual activity: Not Currently    Partners: Male  Lifestyle  . Physical activity:    Days per week: Not on file    Minutes per session: Not on file  . Stress: Not on file  Relationships  . Social connections:    Talks on phone: Not on file    Gets together: Not on file      Attends religious service: Not on file    Active member of club or organization: Not on file    Attends meetings of clubs or organizations: Not on file    Relationship status: Not on file  . Intimate partner violence:    Fear of current or ex partner: Not on file    Emotionally abused: Not on file    Physically abused: Not on file    Forced sexual activity: Not on file  Other Topics Concern  . Not on file  Social History Narrative   Exercise--walking , and pt daily    Current Outpatient Medications on File Prior to Visit  Medication Sig Dispense Refill  . aspirin 81 MG tablet Take 81 mg by mouth at bedtime.     [name] atenolol (TENORMIN) 25 MG tablet TAKE 1 TABLET BY MOUTH EVERY DAY 90 tablet 1  . atorvastatin (LIPITOR) 40 MG tablet TAKE 1 TABLET BY MOUTH EVERYDAY AT BEDTIME 90 tablet 1  . Calcium Citrate-Vitamin D (CALCIUM + D PO) Take 1 tablet by mouth daily.     . Continuous Blood Gluc Receiver (FREESTYLE LIBRE 14 DAY READER) DEVI USE  AS DIRECTED 1 Device 0  . esomeprazole (NEXIUM) 40 MG capsule TAKE ONE CAPSULE BY MOUTH TWICE A DAY BEFORE A MEAL 180 capsule 1  . fenofibrate 160 MG tablet TAKE 1 TABLET BY MOUTH EVERY DAY 90 tablet 1  . glucose blood (ONE TOUCH ULTRA TEST) test strip One touch ultra blue test strips. Check blood sugar twice daily E.119 200 each 4  . Ipratropium-Albuterol (COMBIVENT RESPIMAT) 20-100 MCG/ACT AERS respimat Inhale 1 puff into the lungs 4 (four) times daily as needed for wheezing or shortness of breath. 4 g 5  . mometasone (NASONEX) 50 MCG/ACT nasal spray Place 2 sprays into the nose daily. (Patient taking differently: Place 2 sprays into the nose daily as needed (congestion). ) 17 g 2  . montelukast (SINGULAIR) 10 MG tablet TAKE 1 TABLET BY MOUTH EVERY DAY 90 tablet 1  . Multiple Vitamin (MULTIVITAMIN WITH MINERALS) TABS tablet Take 1 tablet by mouth daily.    [name] DELICA LANCETS 14G MISC Check Blood sugar twice daily. Dx:E11.9 (Patient taking  differently: Check Blood sugar once daily. Dx:E11.9) 100 each 12  . Probiotic Product (PROBIOTIC PO) Take 1 capsule by mouth at bedtime.    [name] Propylene Glycol (SYSTANE BALANCE) 0.6 % SOLN Apply 1 drop to eye as needed.     No current facility-administered medications on file prior to visit.     Allergies  Allergen Reactions  . Contrast Media [Iodinated Diagnostic Agents] Anaphylaxis  . Metamucil [Psyllium] Anaphylaxis  . Mustard Seed Anaphylaxis    Family History  Problem Relation Age of Onset  . Rheumatic fever Mother   . Irritable bowel syndrome Mother   . Breast cancer Mother   . Diabetes Father   . Colon polyps Father   . Kidney disease Father   . Coronary artery disease Unknown   . Hyperlipidemia Unknown   . Hypertension Unknown   . Asthma Unknown   . Cervical cancer Maternal Grandmother   . Breast cancer Maternal Grandmother   . Colon cancer Maternal Grandmother   . Uterine cancer Maternal Grandmother   . Esophageal cancer Neg Hx   . Rectal cancer Neg Hx   . Stomach cancer Neg Hx     BP 112/68   Pulse 61   Ht 5\' 1"  (1.549 m)   Wt 146 lb 6.4 oz (66.4 kg)   SpO2 97%   BMI 27.66 kg/m    Review of Systems She denies hypoglycemia    Objective:   Physical Exam VITAL SIGNS:  See vs page GENERAL: no distress Pulses: foot pulses are intact bilaterally.   MSK: no deformity of the feet or ankles.  CV: no edema of the legs or ankles Skin:  no ulcer on the feet or ankles.  normal color and temp on the feet and ankles Neuro: sensation is intact to touch on the feet and ankles.     A1c=5.6%    Assessment & Plan:  Type 2 DM: well-controlled, but she wants to go back to janumet HTN: well-controlled   Patient Instructions  check your blood sugar once a day.  vary the time of day when you check, between before the 3 meals, and at bedtime.  also check if you have symptoms of your blood sugar being too high or too low.  please keep a record of the readings and bring  it to your next appointment here.  You can write it on any piece of paper.  please call us sooner if your blood sugar  goes below 70, or if you have a lot of readings over 200.  Please change the metformin back to janumet.  continue the same other diabetes medications.  Please come back for a follow-up appointment in 5-6 months.

## 2017-09-25 NOTE — Patient Instructions (Addendum)
check your blood sugar once a day.  vary the time of day when you check, between before the 3 meals, and at bedtime.  also check if you have symptoms of your blood sugar being too high or too low.  please keep a record of the readings and bring it to your next appointment here.  You can write it on any piece of paper.  please call us sooner if your blood sugar goes below 70, or if you have a lot of readings over 200.  Please change the metformin back to janumet.  continue the same other diabetes medications.  Please come back for a follow-up appointment in 5-6 months.

## 2017-10-12 ENCOUNTER — Encounter: Payer: Self-pay | Admitting: Family Medicine

## 2017-10-12 ENCOUNTER — Ambulatory Visit (INDEPENDENT_AMBULATORY_CARE_PROVIDER_SITE_OTHER): Payer: 59 | Admitting: Family Medicine

## 2017-10-12 VITALS — BP 122/76 | HR 61 | Temp 98.7°F | Resp 16 | Ht 60.0 in | Wt 143.0 lb

## 2017-10-12 DIAGNOSIS — E1169 Type 2 diabetes mellitus with other specified complication: Secondary | ICD-10-CM | POA: Diagnosis not present

## 2017-10-12 DIAGNOSIS — E118 Type 2 diabetes mellitus with unspecified complications: Secondary | ICD-10-CM | POA: Diagnosis not present

## 2017-10-12 DIAGNOSIS — I10 Essential (primary) hypertension: Secondary | ICD-10-CM

## 2017-10-12 DIAGNOSIS — Z23 Encounter for immunization: Secondary | ICD-10-CM | POA: Diagnosis not present

## 2017-10-12 DIAGNOSIS — J302 Other seasonal allergic rhinitis: Secondary | ICD-10-CM

## 2017-10-12 DIAGNOSIS — J45901 Unspecified asthma with (acute) exacerbation: Secondary | ICD-10-CM

## 2017-10-12 DIAGNOSIS — Z0000 Encounter for general adult medical examination without abnormal findings: Secondary | ICD-10-CM

## 2017-10-12 DIAGNOSIS — E785 Hyperlipidemia, unspecified: Secondary | ICD-10-CM

## 2017-10-12 DIAGNOSIS — K219 Gastro-esophageal reflux disease without esophagitis: Secondary | ICD-10-CM

## 2017-10-12 DIAGNOSIS — R21 Rash and other nonspecific skin eruption: Secondary | ICD-10-CM

## 2017-10-12 LAB — COMPREHENSIVE METABOLIC PANEL
ALBUMIN: 4.6 g/dL (ref 3.5–5.2)
ALK PHOS: 66 U/L (ref 39–117)
ALT: 21 U/L (ref 0–35)
AST: 17 U/L (ref 0–37)
BILIRUBIN TOTAL: 0.7 mg/dL (ref 0.2–1.2)
BUN: 27 mg/dL — ABNORMAL HIGH (ref 6–23)
CALCIUM: 10.5 mg/dL (ref 8.4–10.5)
CO2: 28 mEq/L (ref 19–32)
Chloride: 104 mEq/L (ref 96–112)
Creatinine, Ser: 0.94 mg/dL (ref 0.40–1.20)
GFR: 64.51 mL/min (ref >60.00)
Glucose, Bld: 94 mg/dL (ref 70–99)
Potassium: 4.8 mEq/L (ref 3.5–5.1)
Sodium: 141 mEq/L (ref 135–145)
TOTAL PROTEIN: 7.3 g/dL (ref 6.0–8.3)

## 2017-10-12 LAB — LIPID PANEL
CHOLESTEROL: 166 mg/dL (ref 0–200)
HDL: 56 mg/dL (ref >39.00)
LDL Cholesterol: 95 mg/dL (ref 0–99)
NonHDL: 110.31
Total CHOL/HDL Ratio: 3
Triglycerides: 77 mg/dL (ref 0.0–149.0)
VLDL: 15.4 mg/dL (ref 0.0–40.0)

## 2017-10-12 LAB — CBC WITH DIFFERENTIAL/PLATELET
BASOS ABS: 0 10*3/uL (ref 0.0–0.1)
Basophils Relative: 0.7 % (ref 0.0–3.0)
Eosinophils Absolute: 0.2 10*3/uL (ref 0.0–0.7)
Eosinophils Relative: 3.9 % (ref 0.0–5.0)
HCT: 43.8 % (ref 36.0–46.0)
Hemoglobin: 14.8 g/dL (ref 12.0–15.0)
LYMPHS ABS: 2 10*3/uL (ref 0.7–4.0)
Lymphocytes Relative: 31 % (ref 12.0–46.0)
MCHC: 33.8 g/dL (ref 30.0–36.0)
MCV: 89.3 fl (ref 78.0–100.0)
Monocytes Absolute: 0.4 10*3/uL (ref 0.1–1.0)
Monocytes Relative: 6.4 % (ref 3.0–12.0)
NEUTROS PCT: 58 % (ref 43.0–77.0)
Neutro Abs: 3.7 10*3/uL (ref 1.4–7.7)
Platelets: 318 10*3/uL (ref 150.0–400.0)
RBC: 4.91 Mil/uL (ref 3.87–5.11)
RDW: 13.4 % (ref 11.5–15.5)
WBC: 6.3 10*3/uL (ref 4.0–10.5)

## 2017-10-12 LAB — MICROALBUMIN / CREATININE URINE RATIO
Creatinine,U: 136.9 mg/dL
MICROALB/CREAT RATIO: 0.8 mg/g (ref 0.0–30.0)
Microalb, Ur: 1.1 mg/dL (ref 0.0–1.9)

## 2017-10-12 LAB — TSH: TSH: 1.3 u[IU]/mL (ref 0.35–4.50)

## 2017-10-12 MED ORDER — FENOFIBRATE 160 MG PO TABS: 160.0000 mg | ORAL_TABLET | Freq: Every day | ORAL | 1 refills | Status: DC

## 2017-10-12 MED ORDER — ESOMEPRAZOLE MAGNESIUM 40 MG PO CPDR: DELAYED_RELEASE_CAPSULE | ORAL | 3 refills | Status: DC

## 2017-10-12 MED ORDER — ATENOLOL 25 MG PO TABS: 25.0000 mg | ORAL_TABLET | Freq: Every day | ORAL | 1 refills | Status: DC

## 2017-10-12 MED ORDER — CLOTRIMAZOLE-BETAMETHASONE 1-0.05 % EX CREA: 1.0000 "application " | TOPICAL_CREAM | Freq: Two times a day (BID) | CUTANEOUS | 0 refills | Status: DC

## 2017-10-12 MED ORDER — ATORVASTATIN CALCIUM 40 MG PO TABS: ORAL_TABLET | ORAL | 1 refills | Status: DC

## 2017-10-12 MED ORDER — MONTELUKAST SODIUM 10 MG PO TABS: 10.0000 mg | ORAL_TABLET | Freq: Every day | ORAL | 1 refills | Status: DC

## 2017-10-12 NOTE — Patient Instructions (Signed)
Preventive Care 40-64 Years, Female Preventive care refers to lifestyle choices and visits with your health care provider that can promote health and wellness. What does preventive care include?  A yearly physical exam. This is also called an annual well check.  Dental exams once or twice a year.  Routine eye exams. Ask your health care provider how often you should have your eyes checked.  Personal lifestyle choices, including: ? Daily care of your teeth and gums. ? Regular physical activity. ? Eating a healthy diet. ? Avoiding tobacco and drug use. ? Limiting alcohol use. ? Practicing safe sex. ? Taking low-dose aspirin daily starting at age 58. ? Taking vitamin and mineral supplements as recommended by your health care provider. What happens during an annual well check? The services and screenings done by your health care provider during your annual well check will depend on your age, overall health, lifestyle risk factors, and family history of disease. Counseling Your health care provider may ask you questions about your:  Alcohol use.  Tobacco use.  Drug use.  Emotional well-being.  Home and relationship well-being.  Sexual activity.  Eating habits.  Work and work Statistician.  Method of birth control.  Menstrual cycle.  Pregnancy history.  Screening You may have the following tests or measurements:  Height, weight, and BMI.  Blood pressure.  Lipid and cholesterol levels. These may be checked every 5 years, or more frequently if you are over 81 years old.  Skin check.  Lung cancer screening. You may have this screening every year starting at age 78 if you have a 30-pack-year history of smoking and currently smoke or have quit within the past 15 years.  Fecal occult blood test (FOBT) of the stool. You may have this test every year starting at age 65.  Flexible sigmoidoscopy or colonoscopy. You may have a sigmoidoscopy every 5 years or a colonoscopy  every 10 years starting at age 30.  Hepatitis C blood test.  Hepatitis B blood test.  Sexually transmitted disease (STD) testing.  Diabetes screening. This is done by checking your blood sugar (glucose) after you have not eaten for a while (fasting). You may have this done every 1-3 years.  Mammogram. This may be done every 1-2 years. Talk to your health care provider about when you should start having regular mammograms. This may depend on whether you have a family history of breast cancer.  BRCA-related cancer screening. This may be done if you have a family history of breast, ovarian, tubal, or peritoneal cancers.  Pelvic exam and Pap test. This may be done every 3 years starting at age 80. Starting at age 36, this may be done every 5 years if you have a Pap test in combination with an HPV test.  Bone density scan. This is done to screen for osteoporosis. You may have this scan if you are at high risk for osteoporosis.  Discuss your test results, treatment options, and if necessary, the need for more tests with your health care provider. Vaccines Your health care provider may recommend certain vaccines, such as:  Influenza vaccine. This is recommended every year.  Tetanus, diphtheria, and acellular pertussis (Tdap, Td) vaccine. You may need a Td booster every 10 years.  Varicella vaccine. You may need this if you have not been vaccinated.  Zoster vaccine. You may need this after age 5.  Measles, mumps, and rubella (MMR) vaccine. You may need at least one dose of MMR if you were born in  1955 or later. You may also need a second dose.  Pneumococcal 13-valent conjugate (PCV13) vaccine. You may need this if you have certain conditions and were not previously vaccinated.  Pneumococcal polysaccharide (PPSV23) vaccine. You may need one or two doses if you smoke cigarettes or if you have certain conditions.  Meningococcal vaccine. You may need this if you have certain  conditions.  Hepatitis A vaccine. You may need this if you have certain conditions or if you travel or work in places where you may be exposed to hepatitis A.  Hepatitis B vaccine. You may need this if you have certain conditions or if you travel or work in places where you may be exposed to hepatitis B.  Haemophilus influenzae type b (Hib) vaccine. You may need this if you have certain conditions.  Talk to your health care provider about which screenings and vaccines you need and how often you need them. This information is not intended to replace advice given to you by your health care provider. Make sure you discuss any questions you have with your health care provider. Document Released: 02/02/2015 Document Revised: 09/26/2015 Document Reviewed: 11/07/2014 [employer] Interactive Patient Education  2018 [employer]  

## 2017-10-12 NOTE — Progress Notes (Signed)
Subjective:     Nicole Richard is a 62 y.o. female and is here for a comprehensive physical exam. The patient reports no problems.  Social History   Socioeconomic History  . Marital status: Divorced    Spouse name: Not on file  . Number of children: 2  . Years of education: Not on file  . Highest education level: Not on file  Occupational History  . Occupation: [employer]  Social Needs  . Financial resource strain: Not hard at all  . Food insecurity:    Worry: Not on file    Inability: Not on file  . Transportation needs:    Medical: Not on file    Non-medical: Not on file  Tobacco Use  . Smoking status: Never Smoker  . Smokeless tobacco: Never Used  Substance and Sexual Activity  . Alcohol use: Yes    Comment: rare  . Drug use: No  . Sexual activity: Not Currently    Partners: Male  Lifestyle  . Physical activity:    Days per week: Not on file    Minutes per session: Not on file  . Stress: Not on file  Relationships  . Social connections:    Talks on phone: Not on file    Gets together: Not on file    Attends religious service: Not on file    Active member of club or organization: Not on file    Attends meetings of clubs or organizations: Not on file    Relationship status: Not on file  . Intimate partner violence:    Fear of current or ex partner: Not on file    Emotionally abused: Not on file    Physically abused: Not on file    Forced sexual activity: Not on file  Other Topics Concern  . Not on file  Social History Narrative   Exercise--walking , and pt daily   Health Maintenance  Topic Date Due  . URINE MICROALBUMIN  10/03/2016  . COLONOSCOPY  08/16/2017  . INFLUENZA VACCINE  08/20/2017  . OPHTHALMOLOGY EXAM  03/10/2018  . HEMOGLOBIN A1C  03/26/2018  . MAMMOGRAM  09/10/2018  . FOOT EXAM  09/26/2018  . PAP SMEAR  10/10/2019  . TETANUS/TDAP  10/19/2022  . PNEUMOCOCCAL POLYSACCHARIDE VACCINE AGE 97-64 HIGH RISK  Completed  . Hepatitis C  Screening  Completed  . HIV Screening  Completed    The following portions of the patient's history were reviewed and updated as appropriate:  She  has a past medical history of Allergy, Asthma, GERD (gastroesophageal reflux disease), Hyperlipidemia, and Hypertension. She does not have any pertinent problems on file. She  has a past surgical history that includes Tubal ligation; Appendectomy; Abdominal hysterectomy (08/2004); Tonsillectomy; Breast enhancement surgery (2001); Colonoscopy; Polypectomy (2009); Shoulder arthroscopy w/ rotator cuff repair (Right, 06/06/2015); and Augmentation mammaplasty (Bilateral, 2000). Her family history includes Asthma in her unknown relative; Breast cancer in her maternal grandmother and mother; Cervical cancer in her maternal grandmother; Colon cancer in her maternal grandmother; Colon polyps in her father; Coronary artery disease in her unknown relative; Diabetes in her father; Hyperlipidemia in her unknown relative; Hypertension in her unknown relative; Irritable bowel syndrome in her mother; Kidney disease in her father; Rheumatic fever in her mother; Uterine cancer in her maternal grandmother. She  reports that she has never smoked. She has never used smokeless tobacco. She reports that she drinks alcohol. She reports that she does not use drugs. She has a current medication list  which includes the following prescription(s): aspirin, atenolol, atorvastatin, bromocriptine, calcium citrate-vitamin d, canagliflozin, freestyle libre 14 day reader, freestyle libre 14 day sensor, cranberry-vitamin c, esomeprazole, fenofibrate, flaxseed (linseed), glucose blood, ipratropium-albuterol, mometasone, montelukast, multivitamin with minerals, onetouch delica lancets 44I, probiotic product, propylene glycol, sitagliptin-metformin hcl, and clotrimazole-betamethasone. Current Outpatient Medications on File Prior to Visit  Medication Sig Dispense Refill  . aspirin 81 MG tablet Take  81 mg by mouth at bedtime.     . bromocriptine (PARLODEL) 2.5 MG tablet 1/4 tab daily 24 tablet 3  . Calcium Citrate-Vitamin D (CALCIUM + D PO) Take 1 tablet by mouth daily.     . canagliflozin (INVOKANA) 300 MG TABS tablet Take 1 tablet (300 mg total) by mouth daily. 90 tablet 3  . Continuous Blood Gluc Receiver (FREESTYLE LIBRE 14 DAY READER) DEVI USE AS DIRECTED 1 Device 0  . Continuous Blood Gluc Sensor (FREESTYLE LIBRE 14 DAY SENSOR) MISC 1 Device by Does not apply route every 14 (fourteen) days. 6 each 3  . CRANBERRY-VITAMIN C PO Take 8,400 mg by mouth daily.    . Flaxseed, Linseed, (FLAXSEED OIL PO) Take 1,400 mg by mouth daily.    [name] glucose blood (ONE TOUCH ULTRA TEST) test strip One touch ultra blue test strips. Check blood sugar twice daily E.119 200 each 4  . Ipratropium-Albuterol (COMBIVENT RESPIMAT) 20-100 MCG/ACT AERS respimat Inhale 1 puff into the lungs 4 (four) times daily as needed for wheezing or shortness of breath. 4 g 5  . mometasone (NASONEX) 50 MCG/ACT nasal spray Place 2 sprays into the nose daily. (Patient taking differently: Place 2 sprays into the nose daily as needed (congestion). ) 17 g 2  . Multiple Vitamin (MULTIVITAMIN WITH MINERALS) TABS tablet Take 1 tablet by mouth daily.    [name] DELICA LANCETS 34V MISC Check Blood sugar twice daily. Dx:E11.9 (Patient taking differently: Check Blood sugar once daily. Dx:E11.9) 100 each 12  . Probiotic Product (PROBIOTIC PO) Take 1 capsule by mouth at bedtime.    [name] Propylene Glycol (SYSTANE BALANCE) 0.6 % SOLN Apply 1 drop to eye as needed.    . SitaGLIPtin-MetFORMIN HCl (JANUMET XR) 50-1000 MG TB24 Take 1 tablet by mouth daily. 90 tablet 3   No current facility-administered medications on file prior to visit.    She is allergic to contrast media [iodinated diagnostic agents]; metamucil [psyllium]; and mustard seed..  Review of Systems Review of Systems  Constitutional: Negative for activity change, appetite change and  fatigue.  HENT: Negative for hearing loss, congestion, tinnitus and ear discharge.  dentist q69m Eyes: Negative for visual disturbance (see optho q1y -- vision corrected to 20/20 with glasses).  Respiratory: Negative for cough, chest tightness and shortness of breath.   Cardiovascular: Negative for chest pain, palpitations and leg swelling.  Gastrointestinal: Negative for abdominal pain, diarrhea, constipation and abdominal distention.  Genitourinary: Negative for urgency, frequency, decreased urine volume and difficulty urinating.  Musculoskeletal: Negative for back pain, arthralgias and gait problem.  Skin: Negative for color change, pallor and rash.  Neurological: Negative for dizziness, light-headedness, numbness and headaches.  Hematological: Negative for adenopathy. Does not bruise/bleed easily.  Psychiatric/Behavioral: Negative for suicidal ideas, confusion, sleep disturbance, self-injury, dysphoric mood, decreased concentration and agitation.      Objective:    BP 122/76 (BP Location: Right Arm, Cuff Size: Normal)   Pulse 61   Temp 98.7 F (37.1 C) (Oral)   Resp 16   Ht 5' (1.524 m)   Wt 143 lb (  64.9 kg)   SpO2 97%   BMI 27.93 kg/m  General appearance: alert, cooperative, appears stated age and no distress Head: Normocephalic, without obvious abnormality, atraumatic Eyes: conjunctivae/corneas clear. PERRL, EOM's intact. Fundi benign. Ears: normal TM's and external ear canals both ears Nose: Nares normal. Septum midline. Mucosa normal. No drainage or sinus tenderness. Throat: lips, mucosa, and tongue normal; teeth and gums normal Neck: no adenopathy, no carotid bruit, no JVD, supple, symmetrical, trachea midline and thyroid not enlarged, symmetric, no tenderness/mass/nodules Back: symmetric, no curvature. ROM normal. No CVA tenderness. Lungs: clear to auscultation bilaterally Breasts: normal appearance, no masses or tenderness Heart: regular rate and rhythm, S1, S2 normal,  no murmur, click, rub or gallop Abdomen: soft, non-tender; bowel sounds normal; no masses,  no organomegaly Pelvic: deferred Extremities: extremities normal, atraumatic, no cyanosis or edema Pulses: 2+ and symmetric Skin: Skin color, texture, turgor normal. No rashes or lesions Lymph nodes: Cervical, supraclavicular, and axillary nodes normal. Neurologic: Alert and oriented X 3, normal strength and tone. Normal symmetric reflexes. Normal coordination and gait    Assessment:    Healthy female exam.      Plan:     ghm utd Check labs  \See After Visit Summary for Counseling Recommendations    1. Type 2 diabetes mellitus with complication, without long-term current use of insulin (HCC) hgba1c per endo, minimize simple carbs. Increase exercise as tolerated. Continue current meds  - Microalbumin / creatinine urine ratio - POCT Urinalysis Dipstick (Automated)  2. Essential hypertension Well controlled, no changes to meds. Encouraged heart healthy diet such as the DASH diet and exercise as tolerated.  - Comprehensive metabolic panel - CBC with Differential/Platelet - atenolol (TENORMIN) 25 MG tablet; Take 1 tablet (25 mg total) by mouth daily.  Dispense: 90 tablet; Refill: 1  3. Hyperlipidemia associated with type 2 diabetes mellitus ([place]) Tolerating statin, encouraged heart healthy diet, avoid trans fats, minimize simple carbs and saturated fats. Increase exercise as tolerated - Comprehensive metabolic panel - Lipid panel - fenofibrate 160 MG tablet; Take 1 tablet (160 mg total) by mouth daily.  Dispense: 90 tablet; Refill: 1 - atorvastatin (LIPITOR) 40 MG tablet; TAKE 1 TABLET BY MOUTH EVERYDAY AT BEDTIME  Dispense: 90 tablet; Refill: 1  4. Preventative health care See above - Comprehensive metabolic panel - CBC with Differential/Platelet - Lipid panel - TSH - Microalbumin / creatinine urine ratio - POCT Urinalysis Dipstick (Automated) - Ambulatory referral to  Gastroenterology  5. Rash  - Ambulatory referral to Dermatology - clotrimazole-betamethasone (LOTRISONE) cream; Apply 1 application topically 2 (two) times daily.  Dispense: 30 g; Refill: 0  6. Seasonal allergic rhinitis con't antihistamine and singulair  - montelukast (SINGULAIR) 10 MG tablet; Take 1 tablet (10 mg total) by mouth daily.  Dispense: 90 tablet; Refill: 1  7. Extrinsic asthma with exacerbation, unspecified asthma severity  Stable ----no refills needed  8. Gastroesophageal reflux disease without esophagitis  Stable  - esomeprazole (NEXIUM) 40 MG capsule; 1 po qd  Dispense: 180 capsule; Refill: 3

## 2017-10-13 ENCOUNTER — Encounter: Payer: Self-pay | Admitting: Endocrinology

## 2017-10-13 DIAGNOSIS — E1165 Type 2 diabetes mellitus with hyperglycemia: Principal | ICD-10-CM

## 2017-10-13 DIAGNOSIS — E1151 Type 2 diabetes mellitus with diabetic peripheral angiopathy without gangrene: Secondary | ICD-10-CM

## 2017-10-13 DIAGNOSIS — IMO0002 Reserved for concepts with insufficient information to code with codable children: Secondary | ICD-10-CM

## 2017-10-21 MED ORDER — CANAGLIFLOZIN 300 MG PO TABS: 300.0000 mg | ORAL_TABLET | Freq: Every day | ORAL | 3 refills | Status: DC

## 2017-10-21 MED ORDER — BROMOCRIPTINE MESYLATE 2.5 MG PO TABS: ORAL_TABLET | ORAL | 3 refills | Status: DC

## 2017-10-21 MED ORDER — FREESTYLE LIBRE 14 DAY SENSOR MISC: 1.0000 | 3 refills | Status: DC

## 2017-10-21 MED ORDER — SITAGLIP PHOS-METFORMIN HCL ER 50-1000 MG PO TB24: 1.0000 | ORAL_TABLET | Freq: Every day | ORAL | 3 refills | Status: DC

## 2017-12-15 ENCOUNTER — Ambulatory Visit (INDEPENDENT_AMBULATORY_CARE_PROVIDER_SITE_OTHER): Payer: 59

## 2017-12-15 DIAGNOSIS — Z23 Encounter for immunization: Secondary | ICD-10-CM | POA: Diagnosis not present

## 2018-01-28 LAB — HM DIABETES EYE EXAM

## 2018-01-31 ENCOUNTER — Encounter: Payer: Self-pay | Admitting: Family Medicine

## 2018-02-01 MED ORDER — GLUCOSE BLOOD VI STRP: ORAL_STRIP | 4 refills | Status: DC

## 2018-02-11 ENCOUNTER — Telehealth: Payer: Self-pay

## 2018-02-11 NOTE — Telephone Encounter (Signed)
Received notification from [facility] that PA for [employer] have been denied. Following reasons provided: The plan limits coverage of Freestyle Libre to members who are on an insulin regimen and consistently monitoring CBG 4 or more times per day. If member requires, they would have to pay out of pocket. Document has been labeled and placed on Dr. Cordelia Pen desk for review.

## 2018-02-15 ENCOUNTER — Encounter: Payer: Self-pay | Admitting: Endocrinology

## 2018-02-15 ENCOUNTER — Ambulatory Visit: Payer: 59 | Admitting: Endocrinology

## 2018-02-15 VITALS — BP 120/78 | HR 72 | Ht 60.0 in | Wt 152.4 lb

## 2018-02-15 DIAGNOSIS — E1151 Type 2 diabetes mellitus with diabetic peripheral angiopathy without gangrene: Secondary | ICD-10-CM

## 2018-02-15 DIAGNOSIS — E1165 Type 2 diabetes mellitus with hyperglycemia: Secondary | ICD-10-CM

## 2018-02-15 DIAGNOSIS — IMO0002 Reserved for concepts with insufficient information to code with codable children: Secondary | ICD-10-CM

## 2018-02-15 LAB — POCT GLYCOSYLATED HEMOGLOBIN (HGB A1C): Hemoglobin A1C: 5.6 % (ref 4.0–5.6)

## 2018-02-15 NOTE — Patient Instructions (Addendum)
check your blood sugar once a day.  vary the time of day when you check, between before the 3 meals, and at bedtime.  also check if you have symptoms of your blood sugar being too high or too low.  please keep a record of the readings and bring it to your next appointment here.  You can write it on any piece of paper.  please call us sooner if your blood sugar goes below 70, or if you have a lot of readings over 200.  continue the same diabetes medications.  Please come back for a follow-up appointment in 6 months.

## 2018-02-15 NOTE — Progress Notes (Signed)
Subjective:    Patient ID: Nicole Richard, female    DOB: [birth year], 63 y.o.   MRN: [id number]  HPI Pt returns for f/u of diabetes mellitus: DM type: 2 Dx'ed: 7782 Complications: none Therapy: 4 oral meds.  GDM: never DKA: never Severe hypoglycemia: never.  Pancreatitis: never.  Other: she has never taken insulin.  Interval history: pt states she feels well in general. She takes meds as rx'ed.  I reviewed continuous glucose monitor info.  Glucose varies from 80-160.  It is in general highest at HS.   Past Medical History:  Diagnosis Date  . Allergy    seasonal  . Asthma   . GERD (gastroesophageal reflux disease)   . Hyperlipidemia   . Hypertension     Past Surgical History:  Procedure Laterality Date  . ABDOMINAL HYSTERECTOMY  08/2004  . APPENDECTOMY    . AUGMENTATION MAMMAPLASTY Bilateral 2000  . BREAST ENHANCEMENT SURGERY  2001  . COLONOSCOPY    . POLYPECTOMY  2009   sigmoid polyps  . SHOULDER ARTHROSCOPY W/ ROTATOR CUFF REPAIR Right 06/06/2015   [facility]  . TONSILLECTOMY    . TUBAL LIGATION      Social History   Socioeconomic History  . Marital status: Divorced    Spouse name: Not on file  . Number of children: 2  . Years of education: Not on file  . Highest education level: Not on file  Occupational History  . Occupation: [employer]  Social Needs  . Financial resource strain: Not hard at all  . Food insecurity:    Worry: Not on file    Inability: Not on file  . Transportation needs:    Medical: Not on file    Non-medical: Not on file  Tobacco Use  . Smoking status: Never Smoker  . Smokeless tobacco: Never Used  Substance and Sexual Activity  . Alcohol use: Yes    Comment: rare  . Drug use: No  . Sexual activity: Not Currently    Partners: Male  Lifestyle  . Physical activity:    Days per week: Not on file    Minutes per session: Not on file  . Stress: Not on file  Relationships  . Social connections:    Talks on phone: Not on  file    Gets together: Not on file    Attends religious service: Not on file    Active member of club or organization: Not on file    Attends meetings of clubs or organizations: Not on file    Relationship status: Not on file  . Intimate partner violence:    Fear of current or ex partner: Not on file    Emotionally abused: Not on file    Physically abused: Not on file    Forced sexual activity: Not on file  Other Topics Concern  . Not on file  Social History Narrative   Exercise--walking , and pt daily    Current Outpatient Medications on File Prior to Visit  Medication Sig Dispense Refill  . aspirin 81 MG tablet Take 81 mg by mouth at bedtime.     [name] atenolol (TENORMIN) 25 MG tablet Take 1 tablet (25 mg total) by mouth daily. 90 tablet 1  . atorvastatin (LIPITOR) 40 MG tablet TAKE 1 TABLET BY MOUTH EVERYDAY AT BEDTIME 90 tablet 1  . bromocriptine (PARLODEL) 2.5 MG tablet 1/4 tab daily 24 tablet 3  . Calcium Citrate-Vitamin D (CALCIUM + D PO) Take 1 tablet by mouth  daily.     . canagliflozin (INVOKANA) 300 MG TABS tablet Take 1 tablet (300 mg total) by mouth daily. 90 tablet 3  . clotrimazole-betamethasone (LOTRISONE) cream Apply 1 application topically 2 (two) times daily. 30 g 0  . Continuous Blood Gluc Receiver (FREESTYLE LIBRE 14 DAY READER) DEVI USE AS DIRECTED 1 Device 0  . Continuous Blood Gluc Sensor (FREESTYLE LIBRE 14 DAY SENSOR) MISC 1 Device by Does not apply route every 14 (fourteen) days. 6 each 3  . CRANBERRY-VITAMIN C PO Take 8,400 mg by mouth daily.    [name] esomeprazole (NEXIUM) 40 MG capsule 1 po qd 180 capsule 3  . fenofibrate 160 MG tablet Take 1 tablet (160 mg total) by mouth daily. 90 tablet 1  . Flaxseed, Linseed, (FLAXSEED OIL PO) Take 1,400 mg by mouth daily.    [name] glucose blood (ONE TOUCH ULTRA TEST) test strip One touch ultra blue test strips. Check blood sugar twice daily E.119 200 each 4  . Ipratropium-Albuterol (COMBIVENT RESPIMAT) 20-100 MCG/ACT AERS  respimat Inhale 1 puff into the lungs 4 (four) times daily as needed for wheezing or shortness of breath. 4 g 5  . montelukast (SINGULAIR) 10 MG tablet Take 1 tablet (10 mg total) by mouth daily. 90 tablet 1  . Multiple Vitamin (MULTIVITAMIN WITH MINERALS) TABS tablet Take 1 tablet by mouth daily.    [name] DELICA LANCETS 16X MISC Check Blood sugar twice daily. Dx:E11.9 (Patient taking differently: Check Blood sugar once daily. Dx:E11.9) 100 each 12  . Probiotic Product (PROBIOTIC PO) Take 1 capsule by mouth at bedtime.    [name] Propylene Glycol (SYSTANE BALANCE) 0.6 % SOLN Apply 1 drop to eye as needed.    . SitaGLIPtin-MetFORMIN HCl (JANUMET XR) 50-1000 MG TB24 Take 1 tablet by mouth daily. 90 tablet 3  . mometasone (NASONEX) 50 MCG/ACT nasal spray Place 2 sprays into the nose daily. (Patient taking differently: Place 2 sprays into the nose daily as needed (congestion). ) 17 g 2   No current facility-administered medications on file prior to visit.     Allergies  Allergen Reactions  . Contrast Media [Iodinated Diagnostic Agents] Anaphylaxis  . Metamucil [Psyllium] Anaphylaxis  . Mustard Seed Anaphylaxis    Family History  Problem Relation Age of Onset  . Rheumatic fever Mother   . Irritable bowel syndrome Mother   . Breast cancer Mother   . Diabetes Father   . Colon polyps Father   . Kidney disease Father   . Coronary artery disease Unknown   . Hyperlipidemia Unknown   . Hypertension Unknown   . Asthma Unknown   . Cervical cancer Maternal Grandmother   . Breast cancer Maternal Grandmother   . Colon cancer Maternal Grandmother   . Uterine cancer Maternal Grandmother   . Esophageal cancer Neg Hx   . Rectal cancer Neg Hx   . Stomach cancer Neg Hx     BP 120/78 (BP Location: Left Arm, Patient Position: Sitting, Cuff Size: Normal)   Pulse 72   Ht 5' (1.524 m)   Wt 152 lb 6.4 oz (69.1 kg)   SpO2 95%   BMI 29.76 kg/m    Review of Systems She denies hypoglycemia.        Objective:   Physical Exam VITAL SIGNS:  See vs page GENERAL: no distress Pulses: dorsalis pedis intact bilat.   MSK: no deformity of the feet CV: no leg edema Skin:  no ulcer on the feet.  normal color and temp  on the feet. Neuro: sensation is intact to touch on the feet  Lab Results  Component Value Date   HGBA1C 5.6 02/15/2018       Assessment & Plan:  Type 2 DM: well-controlled.   Patient Instructions  check your blood sugar once a day.  vary the time of day when you check, between before the 3 meals, and at bedtime.  also check if you have symptoms of your blood sugar being too high or too low.  please keep a record of the readings and bring it to your next appointment here.  You can write it on any piece of paper.  please call us sooner if your blood sugar goes below 70, or if you have a lot of readings over 200.  continue the same diabetes medications.  Please come back for a follow-up appointment in 6 months.

## 2018-02-18 ENCOUNTER — Other Ambulatory Visit: Payer: Self-pay | Admitting: Endocrinology

## 2018-02-23 ENCOUNTER — Telehealth: Payer: Self-pay | Admitting: *Deleted

## 2018-02-23 NOTE — Telephone Encounter (Signed)
Received Diabetic Eye Exam Report from [facility]; forwarded to provider/SLS 02/04

## 2018-03-12 ENCOUNTER — Encounter: Payer: Self-pay | Admitting: Family Medicine

## 2018-03-29 ENCOUNTER — Ambulatory Visit: Payer: 59 | Admitting: Endocrinology

## 2018-04-16 ENCOUNTER — Ambulatory Visit: Payer: 59 | Admitting: Family Medicine

## 2018-05-28 ENCOUNTER — Ambulatory Visit: Payer: 59 | Admitting: Family Medicine

## 2018-06-03 DIAGNOSIS — Z20828 Contact with and (suspected) exposure to other viral communicable diseases: Secondary | ICD-10-CM | POA: Diagnosis not present

## 2018-06-05 ENCOUNTER — Other Ambulatory Visit: Payer: Self-pay | Admitting: Endocrinology

## 2018-06-05 DIAGNOSIS — E1151 Type 2 diabetes mellitus with diabetic peripheral angiopathy without gangrene: Secondary | ICD-10-CM

## 2018-06-05 DIAGNOSIS — IMO0002 Reserved for concepts with insufficient information to code with codable children: Secondary | ICD-10-CM

## 2018-06-11 ENCOUNTER — Other Ambulatory Visit: Payer: Self-pay | Admitting: Endocrinology

## 2018-06-18 ENCOUNTER — Encounter: Payer: Self-pay | Admitting: Family Medicine

## 2018-06-18 DIAGNOSIS — E785 Hyperlipidemia, unspecified: Secondary | ICD-10-CM

## 2018-06-18 DIAGNOSIS — E1169 Type 2 diabetes mellitus with other specified complication: Secondary | ICD-10-CM

## 2018-06-18 MED ORDER — FENOFIBRATE 160 MG PO TABS: 160.0000 mg | ORAL_TABLET | Freq: Every day | ORAL | 1 refills | Status: DC

## 2018-07-02 ENCOUNTER — Other Ambulatory Visit: Payer: Self-pay | Admitting: Family Medicine

## 2018-07-02 DIAGNOSIS — Z1231 Encounter for screening mammogram for malignant neoplasm of breast: Secondary | ICD-10-CM

## 2018-07-14 ENCOUNTER — Encounter: Payer: Self-pay | Admitting: Family Medicine

## 2018-07-14 DIAGNOSIS — U071 COVID-19: Secondary | ICD-10-CM | POA: Diagnosis not present

## 2018-07-14 DIAGNOSIS — Z20828 Contact with and (suspected) exposure to other viral communicable diseases: Secondary | ICD-10-CM | POA: Diagnosis not present

## 2018-07-17 ENCOUNTER — Other Ambulatory Visit: Payer: Self-pay | Admitting: Family Medicine

## 2018-07-17 DIAGNOSIS — K219 Gastro-esophageal reflux disease without esophagitis: Secondary | ICD-10-CM

## 2018-07-22 ENCOUNTER — Encounter: Payer: Self-pay | Admitting: Gastroenterology

## 2018-08-18 ENCOUNTER — Other Ambulatory Visit: Payer: Self-pay

## 2018-08-20 ENCOUNTER — Ambulatory Visit: Payer: BC Managed Care – PPO | Admitting: Endocrinology

## 2018-08-20 ENCOUNTER — Other Ambulatory Visit: Payer: Self-pay

## 2018-08-20 ENCOUNTER — Encounter: Payer: Self-pay | Admitting: Endocrinology

## 2018-08-20 VITALS — BP 124/76 | HR 75 | Ht 60.0 in | Wt 163.2 lb

## 2018-08-20 DIAGNOSIS — E1165 Type 2 diabetes mellitus with hyperglycemia: Secondary | ICD-10-CM | POA: Diagnosis not present

## 2018-08-20 DIAGNOSIS — E1151 Type 2 diabetes mellitus with diabetic peripheral angiopathy without gangrene: Secondary | ICD-10-CM

## 2018-08-20 DIAGNOSIS — E119 Type 2 diabetes mellitus without complications: Secondary | ICD-10-CM

## 2018-08-20 DIAGNOSIS — IMO0002 Reserved for concepts with insufficient information to code with codable children: Secondary | ICD-10-CM

## 2018-08-20 LAB — POCT GLYCOSYLATED HEMOGLOBIN (HGB A1C): Hemoglobin A1C: 5.9 % — AB (ref 4.0–5.6)

## 2018-08-20 NOTE — Patient Instructions (Addendum)
check your blood sugar once a day.  vary the time of day when you check, between before the 3 meals, and at bedtime.  also check if you have symptoms of your blood sugar being too high or too low.  please keep a record of the readings and bring it to your next appointment here.  You can write it on any piece of paper.  please call us sooner if your blood sugar goes below 70, or if you have a lot of readings over 200.  You can stay off the invokana, and Please stop taking the bromocriptine.   continue the same janumet.  Please come back for a follow-up appointment in 6 months.

## 2018-08-20 NOTE — Progress Notes (Signed)
Subjective:    Patient ID: Nicole Richard, female    DOB: January 23, 1955, 63 y.o.   MRN: [id number]  HPI Pt returns for f/u of diabetes mellitus: DM type: 2 Dx'ed: 9767 Complications: none Therapy: 4 oral meds.  GDM: never DKA: never Severe hypoglycemia: never.  Pancreatitis: never.  Other: she has never taken insulin.  Interval history: pt states she feels well in general.  I reviewed continuous glucose monitor info.  Glucose varies from 80-170.  It is in general highest at HS. She has not recently taken the invokana.   Past Medical History:  Diagnosis Date  . Allergy    seasonal  . Asthma   . GERD (gastroesophageal reflux disease)   . Hyperlipidemia   . Hypertension     Past Surgical History:  Procedure Laterality Date  . ABDOMINAL HYSTERECTOMY  08/2004  . APPENDECTOMY    . AUGMENTATION MAMMAPLASTY Bilateral 2000  . BREAST ENHANCEMENT SURGERY  2001  . COLONOSCOPY    . POLYPECTOMY  2009   sigmoid polyps  . SHOULDER ARTHROSCOPY W/ ROTATOR CUFF REPAIR Right 06/06/2015   [facility]  . TONSILLECTOMY    . TUBAL LIGATION      Social History   Socioeconomic History  . Marital status: Divorced    Spouse name: Not on file  . Number of children: 2  . Years of education: Not on file  . Highest education level: Not on file  Occupational History  . Occupation: [employer]  Social Needs  . Financial resource strain: Not hard at all  . Food insecurity    Worry: Not on file    Inability: Not on file  . Transportation needs    Medical: Not on file    Non-medical: Not on file  Tobacco Use  . Smoking status: Never Smoker  . Smokeless tobacco: Never Used  Substance and Sexual Activity  . Alcohol use: Yes    Comment: rare  . Drug use: No  . Sexual activity: Not Currently    Partners: Male  Lifestyle  . Physical activity    Days per week: Not on file    Minutes per session: Not on file  . Stress: Not on file  Relationships  . Social Herbalist on  phone: Not on file    Gets together: Not on file    Attends religious service: Not on file    Active member of club or organization: Not on file    Attends meetings of clubs or organizations: Not on file    Relationship status: Not on file  . Intimate partner violence    Fear of current or ex partner: Not on file    Emotionally abused: Not on file    Physically abused: Not on file    Forced sexual activity: Not on file  Other Topics Concern  . Not on file  Social History Narrative   Exercise--walking , and pt daily    Current Outpatient Medications on File Prior to Visit  Medication Sig Dispense Refill  . aspirin 81 MG tablet Take 81 mg by mouth at bedtime.     [name] atenolol (TENORMIN) 25 MG tablet Take 1 tablet (25 mg total) by mouth daily. 90 tablet 1  . atorvastatin (LIPITOR) 40 MG tablet TAKE 1 TABLET BY MOUTH EVERYDAY AT BEDTIME 90 tablet 1  . Calcium Citrate-Vitamin D (CALCIUM + D PO) Take 1 tablet by mouth daily.     . clotrimazole-betamethasone (LOTRISONE) cream Apply 1  application topically 2 (two) times daily. 30 g 0  . Continuous Blood Gluc Receiver (FREESTYLE LIBRE 14 DAY READER) DEVI USE AS DIRECTED 1 Device 0  . Continuous Blood Gluc Sensor (FREESTYLE LIBRE 14 DAY SENSOR) MISC APPLY EVERY 14 (FOURTEEN) DAYS. 6 each 3  . CRANBERRY-VITAMIN C PO Take 8,400 mg by mouth daily.    [name] esomeprazole (NEXIUM) 40 MG capsule TAKE ONE CAPSULE BY MOUTH TWICE A DAY BEFORE A MEAL 180 capsule 1  . fenofibrate 160 MG tablet Take 1 tablet (160 mg total) by mouth daily. 90 tablet 1  . Flaxseed, Linseed, (FLAXSEED OIL PO) Take 1,400 mg by mouth daily.    [name] glucose blood (ONE TOUCH ULTRA TEST) test strip One touch ultra blue test strips. Check blood sugar twice daily E.119 200 each 4  . Ipratropium-Albuterol (COMBIVENT RESPIMAT) 20-100 MCG/ACT AERS respimat Inhale 1 puff into the lungs 4 (four) times daily as needed for wheezing or shortness of breath. 4 g 5  . JANUMET XR 50-1000 MG TB24 TAKE 2  TABLETS BY MOUTH EVERY DAY 180 tablet 3  . montelukast (SINGULAIR) 10 MG tablet Take 1 tablet (10 mg total) by mouth daily. 90 tablet 1  . Multiple Vitamin (MULTIVITAMIN WITH MINERALS) TABS tablet Take 1 tablet by mouth daily.    [name] DELICA LANCETS 75Z MISC Check Blood sugar twice daily. Dx:E11.9 (Patient taking differently: Check Blood sugar once daily. Dx:E11.9) 100 each 12  . Probiotic Product (PROBIOTIC PO) Take 1 capsule by mouth at bedtime.    [name] Propylene Glycol (SYSTANE BALANCE) 0.6 % SOLN Apply 1 drop to eye as needed.    . mometasone (NASONEX) 50 MCG/ACT nasal spray Place 2 sprays into the nose daily. (Patient taking differently: Place 2 sprays into the nose daily as needed (congestion). ) 17 g 2   No current facility-administered medications on file prior to visit.     Allergies  Allergen Reactions  . Contrast Media [Iodinated Diagnostic Agents] Anaphylaxis  . Metamucil [Psyllium] Anaphylaxis  . Mustard Seed Anaphylaxis    Family History  Problem Relation Age of Onset  . Rheumatic fever Mother   . Irritable bowel syndrome Mother   . Breast cancer Mother   . Diabetes Father   . Colon polyps Father   . Kidney disease Father   . Coronary artery disease Unknown   . Hyperlipidemia Unknown   . Hypertension Unknown   . Asthma Unknown   . Cervical cancer Maternal Grandmother   . Breast cancer Maternal Grandmother   . Colon cancer Maternal Grandmother   . Uterine cancer Maternal Grandmother   . Esophageal cancer Neg Hx   . Rectal cancer Neg Hx   . Stomach cancer Neg Hx     BP 124/76 (BP Location: Left Arm, Patient Position: Sitting, Cuff Size: Large)   Pulse 75   Ht 5' (1.524 m)   Wt 163 lb 3.2 oz (74 kg)   SpO2 96%   BMI 31.87 kg/m    Review of Systems She denies hypoglycemia    Objective:   Physical Exam VITAL SIGNS:  See vs page GENERAL: no distress Pulses: dorsalis pedis intact bilat.   MSK: no deformity of the feet CV: no leg edema Skin:  no  ulcer on the feet.  normal color and temp on the feet. Neuro: sensation is intact to touch on the feet Ext: there is bilateral onychomycosis of the toenails  Lab Results  Component Value Date   CREATININE 0.94 10/12/2017  BUN 27 (H) 10/12/2017   NA 141 10/12/2017   K 4.8 10/12/2017   CL 104 10/12/2017   CO2 28 10/12/2017    Lab Results  Component Value Date   HGBA1C 5.9 (A) 08/20/2018       Assessment & Plan:  Type 2 DM: well-controlled.  She can reduce rx.  HTN: well-controlled.  Please continue the same medication.   Patient Instructions  check your blood sugar once a day.  vary the time of day when you check, between before the 3 meals, and at bedtime.  also check if you have symptoms of your blood sugar being too high or too low.  please keep a record of the readings and bring it to your next appointment here.  You can write it on any piece of paper.  please call us sooner if your blood sugar goes below 70, or if you have a lot of readings over 200.  You can stay off the invokana, and Please stop taking the bromocriptine.   continue the same janumet.  Please come back for a follow-up appointment in 6 months.

## 2018-08-27 ENCOUNTER — Ambulatory Visit (AMBULATORY_SURGERY_CENTER): Payer: Self-pay

## 2018-08-27 ENCOUNTER — Other Ambulatory Visit: Payer: Self-pay

## 2018-08-27 VITALS — Ht 61.0 in | Wt 163.0 lb

## 2018-08-27 DIAGNOSIS — Z1211 Encounter for screening for malignant neoplasm of colon: Secondary | ICD-10-CM

## 2018-08-27 MED ORDER — NA SULFATE-K SULFATE-MG SULF 17.5-3.13-1.6 GM/177ML PO SOLN: 1.0000 | Freq: Once | ORAL | 0 refills | Status: AC

## 2018-08-27 NOTE — Progress Notes (Signed)
Denies allergies to eggs or soy products. Denies complication of anesthesia or sedation. Denies use of weight loss medication. Denies use of O2.   Emmi instructions given for colonoscopy.  Pre-Visit was conducted by Phone due to Covid 19. Instructions were reviewed and mailed to patients confirmed home address. A 15.00 coupon for Suprep was given to the patient. Patient was encouraged to call if she had any questions regarding instructions.

## 2018-09-05 ENCOUNTER — Other Ambulatory Visit: Payer: Self-pay | Admitting: Family Medicine

## 2018-09-05 DIAGNOSIS — J302 Other seasonal allergic rhinitis: Secondary | ICD-10-CM

## 2018-09-05 DIAGNOSIS — I10 Essential (primary) hypertension: Secondary | ICD-10-CM

## 2018-09-09 ENCOUNTER — Telehealth: Payer: Self-pay

## 2018-09-09 NOTE — Telephone Encounter (Signed)
Pt responded "no" to all screening questions °

## 2018-09-09 NOTE — Telephone Encounter (Signed)
Covid-19 screening questions   Do you now or have you had a fever in the last 14 days?  Do you have any respiratory symptoms of shortness of breath or cough now or in the last 14 days?  Do you have any family members or close contacts with diagnosed or suspected Covid-19 in the past 14 days?  Have you been tested for Covid-19 and found to be positive?      Left message to call back

## 2018-09-10 ENCOUNTER — Other Ambulatory Visit: Payer: Self-pay

## 2018-09-10 ENCOUNTER — Ambulatory Visit (AMBULATORY_SURGERY_CENTER): Payer: BC Managed Care – PPO | Admitting: Gastroenterology

## 2018-09-10 ENCOUNTER — Encounter: Payer: Self-pay | Admitting: Gastroenterology

## 2018-09-10 VITALS — BP 120/56 | HR 63 | Temp 99.1°F | Resp 17 | Ht 60.0 in | Wt 163.0 lb

## 2018-09-10 DIAGNOSIS — D122 Benign neoplasm of ascending colon: Secondary | ICD-10-CM

## 2018-09-10 DIAGNOSIS — K635 Polyp of colon: Secondary | ICD-10-CM | POA: Diagnosis not present

## 2018-09-10 DIAGNOSIS — D120 Benign neoplasm of cecum: Secondary | ICD-10-CM

## 2018-09-10 DIAGNOSIS — Z1211 Encounter for screening for malignant neoplasm of colon: Secondary | ICD-10-CM

## 2018-09-10 MED ORDER — SODIUM CHLORIDE 0.9 % IV SOLN: 500.0000 mL | Freq: Once | INTRAVENOUS | Status: DC

## 2018-09-10 NOTE — Op Note (Signed)
[place] [facility] Patient Name: Nicole Richard Procedure Date: 09/10/2018 9:06 AM MRN: [id number] Endoscopist: Jackquline Denmark , MD Age: 63 Referring MD:  Date of Birth: 03/17/1955 Gender: Female Account #: [id number] Procedure:                Colonoscopy Indications:              Screening for colorectal malignant neoplasm Medicines:                Monitored Anesthesia Care Procedure:                Pre-Anesthesia Assessment:                           - Prior to the procedure, a History and Physical                            was performed, and patient medications and                            allergies were reviewed. The patient's tolerance of                            previous anesthesia was also reviewed. The risks                            and benefits of the procedure and the sedation                            options and risks were discussed with the patient.                            All questions were answered, and informed consent                            was obtained. Prior Anticoagulants: The patient has                            taken no previous anticoagulant or antiplatelet                            agents. ASA Grade Assessment: I - A normal, healthy                            patient. After reviewing the risks and benefits,                            the patient was deemed in satisfactory condition to                            undergo the procedure.                           After obtaining informed consent, the colonoscope  was passed under direct vision. Throughout the                            procedure, the patient's blood pressure, pulse, and                            oxygen saturations were monitored continuously. The                            Colonoscope was introduced through the anus and                            advanced to the 2 cm into the ileum. The                            colonoscopy was performed without  difficulty. The                            patient tolerated the procedure well. The quality                            of the bowel preparation was excellent. The                            terminal ileum, ileocecal valve, appendiceal                            orifice, and rectum were photographed. Scope In: 9:12:16 AM Scope Out: 9:25:08 AM Scope Withdrawal Time: 0 hours 9 minutes 29 seconds  Total Procedure Duration: 0 hours 12 minutes 52 seconds  Findings:                 Two sessile polyps were found in the ascending                            colon and cecum. The polyps were 2 to 4 mm in size.                            These polyps were removed with a cold biopsy                            forceps. Resection and retrieval were complete.                            Estimated blood loss: none.                           A few small-mouthed diverticula were found in the                            sigmoid colon.                           Non-bleeding internal hemorrhoids were found during  retroflexion. The hemorrhoids were small.                           The terminal ileum appeared normal.                           The exam was otherwise without abnormality on                            direct and retroflexion views. Complications:            No immediate complications. Estimated Blood Loss:     Estimated blood loss: none. Impression:               -Diminutive colonic polyps S/P polypectomy.                           -Mild sigmoid diverticulosis.                           -Non-bleeding internal hemorrhoids.                           -Otherwise normal colonoscopy to TI. Recommendation:           - Patient has a contact number available for                            emergencies. The signs and symptoms of potential                            delayed complications were discussed with the                            patient. Return to normal activities tomorrow.                             Written discharge instructions were provided to the                            patient.                           - High-fiber diet.                           - Continue present medications.                           - Await pathology results.                           - Repeat colonoscopy for surveillance based on                            pathology results.                           - Return to GI clinic PRN.  Jackquline Denmark, MD 09/10/2018 9:29:43 AM This report has been signed electronically.

## 2018-09-10 NOTE — Progress Notes (Signed)
A and O x3. Report to RN. Tolerated MAC anesthesia well.

## 2018-09-10 NOTE — Patient Instructions (Addendum)
YOU HAD AN ENDOSCOPIC PROCEDURE TODAY AT [facility]:   Refer to the procedure report that was given to you for any specific questions about what was found during the examination.  If the procedure report does not answer your questions, please call your gastroenterologist to clarify.  If you requested that your care partner not be given the details of your procedure findings, then the procedure report has been included in a sealed envelope for you to review at your convenience later.  YOU SHOULD EXPECT: Some feelings of bloating in the abdomen. Passage of more gas than usual.  Walking can help get rid of the air that was put into your GI tract during the procedure and reduce the bloating. If you had a lower endoscopy (such as a colonoscopy or flexible sigmoidoscopy) you may notice spotting of blood in your stool or on the toilet paper. If you underwent a bowel prep for your procedure, you may not have a normal bowel movement for a few days.  Please Note:  You might notice some irritation and congestion in your nose or some drainage.  This is from the oxygen used during your procedure.  There is no need for concern and it should clear up in a day or so.  SYMPTOMS TO REPORT IMMEDIATELY:   Following lower endoscopy (colonoscopy or flexible sigmoidoscopy):  Excessive amounts of blood in the stool  Significant tenderness or worsening of abdominal pains  Swelling of the abdomen that is new, acute  Fever of 100F or higher   For urgent or emergent issues, a gastroenterologist can be reached at any hour by calling [phone].   DIET:  We do recommend a small meal at first, but then you may proceed to your regular diet.  Drink plenty of fluids but you should avoid alcoholic beverages for 24 hours. Please follow a High Fiber Diet (handout given to you).  MEDICATIONS: Continue present medications.  Please see handouts given to you by your recovery nurse.  ACTIVITY:  You should plan  to take it easy for the rest of today and you should NOT DRIVE or use heavy machinery until tomorrow (because of the sedation medicines used during the test).    FOLLOW UP: Our staff will call the number listed on your records 48-72 hours following your procedure to check on you and address any questions or concerns that you may have regarding the information given to you following your procedure. If we do not reach you, we will leave a message.  We will attempt to reach you two times.  During this call, we will ask if you have developed any symptoms of COVID 19. If you develop any symptoms (ie: fever, flu-like symptoms, shortness of breath, cough etc.) before then, please call [phone].  If you test positive for Covid 19 in the 2 weeks post procedure, please call and report this information to [place].    If any biopsies were taken you will be contacted by phone or by letter within the next 1-3 weeks.  Please call us at [phone] if you have not heard about the biopsies in 3 weeks.   Thank you for allowing [place] to provide for your healthcare needs today.  Return to Dr. Steve Rattler office as needed.   SIGNATURES/CONFIDENTIALITY: You and/or your care partner have signed paperwork which will be entered into your electronic medical record.  These signatures attest to the fact that that the information above on your After Visit Summary has been  reviewed and is understood.  Full responsibility of the confidentiality of this discharge information lies with you and/or your care-partner. 

## 2018-09-10 NOTE — Progress Notes (Signed)
Pt's states no medical or surgical changes since previsit or office visit.  [place]

## 2018-09-14 ENCOUNTER — Telehealth: Payer: Self-pay | Admitting: *Deleted

## 2018-09-14 ENCOUNTER — Telehealth: Payer: Self-pay

## 2018-09-14 NOTE — Telephone Encounter (Signed)
  Follow up Call-  Call back number 09/10/2018  Post procedure Call Back phone  # [phone]  Permission to leave phone message Yes  Some recent data might be hidden     Patient questions:  Do you have a fever, pain , or abdominal swelling? No. Pain Score  0 *  Have you tolerated food without any problems? Yes.    Have you been able to return to your normal activities? Yes.    Do you have any questions about your discharge instructions: Diet   No. Medications  No. Follow up visit  No.  Do you have questions or concerns about your Care? No.  Actions: * If pain score is 4 or above: No action needed, pain <4.  1. Have you developed a fever since your procedure? no  2.   Have you had an respiratory symptoms (SOB or cough) since your procedure? no  3.   Have you tested positive for COVID 19 since your procedure no  4.   Have you had any family members/close contacts diagnosed with the COVID 19 since your procedure?  no   If yes to any of these questions please route to Joylene John, RN and Alphonsa Gin, Therapist, sports.

## 2018-09-14 NOTE — Telephone Encounter (Signed)
Left message on follow up call. 

## 2018-09-17 ENCOUNTER — Ambulatory Visit
Admission: RE | Admit: 2018-09-17 | Discharge: 2018-09-17 | Disposition: A | Discharge status: Home / Self Care | Payer: BC Managed Care – PPO | Source: Ambulatory Visit | Attending: Family Medicine | Admitting: Family Medicine

## 2018-09-17 ENCOUNTER — Other Ambulatory Visit: Payer: Self-pay

## 2018-09-17 DIAGNOSIS — Z1231 Encounter for screening mammogram for malignant neoplasm of breast: Secondary | ICD-10-CM | POA: Diagnosis not present

## 2018-09-19 ENCOUNTER — Encounter: Payer: Self-pay | Admitting: Gastroenterology

## 2018-09-22 DIAGNOSIS — Z20828 Contact with and (suspected) exposure to other viral communicable diseases: Secondary | ICD-10-CM | POA: Diagnosis not present

## 2018-10-13 DIAGNOSIS — Z20828 Contact with and (suspected) exposure to other viral communicable diseases: Secondary | ICD-10-CM | POA: Diagnosis not present

## 2018-10-14 ENCOUNTER — Other Ambulatory Visit: Payer: Self-pay

## 2018-10-15 ENCOUNTER — Encounter: Payer: Self-pay | Admitting: Family Medicine

## 2018-10-15 ENCOUNTER — Ambulatory Visit (INDEPENDENT_AMBULATORY_CARE_PROVIDER_SITE_OTHER): Payer: BC Managed Care – PPO | Admitting: Family Medicine

## 2018-10-15 VITALS — BP 136/80 | HR 70 | Temp 97.8°F | Resp 18 | Ht 60.0 in | Wt 165.2 lb

## 2018-10-15 DIAGNOSIS — E785 Hyperlipidemia, unspecified: Secondary | ICD-10-CM | POA: Diagnosis not present

## 2018-10-15 DIAGNOSIS — Z0000 Encounter for general adult medical examination without abnormal findings: Secondary | ICD-10-CM

## 2018-10-15 DIAGNOSIS — E1165 Type 2 diabetes mellitus with hyperglycemia: Secondary | ICD-10-CM

## 2018-10-15 DIAGNOSIS — I10 Essential (primary) hypertension: Secondary | ICD-10-CM

## 2018-10-15 DIAGNOSIS — E1169 Type 2 diabetes mellitus with other specified complication: Secondary | ICD-10-CM | POA: Diagnosis not present

## 2018-10-15 LAB — LIPID PANEL
Cholesterol: 156 mg/dL (ref 0–200)
HDL: 49.6 mg/dL (ref >39.00)
LDL Cholesterol: 86 mg/dL (ref 0–99)
NonHDL: 105.97
Total CHOL/HDL Ratio: 3
Triglycerides: 98 mg/dL (ref 0.0–149.0)
VLDL: 19.6 mg/dL (ref 0.0–40.0)

## 2018-10-15 LAB — CBC WITH DIFFERENTIAL/PLATELET
Basophils Absolute: 0.1 10*3/uL (ref 0.0–0.1)
Basophils Relative: 0.7 % (ref 0.0–3.0)
Eosinophils Absolute: 0.2 10*3/uL (ref 0.0–0.7)
Eosinophils Relative: 3.4 % (ref 0.0–5.0)
HCT: 41.1 % (ref 36.0–46.0)
Hemoglobin: 13.7 g/dL (ref 12.0–15.0)
Lymphocytes Relative: 31.7 % (ref 12.0–46.0)
Lymphs Abs: 2.2 10*3/uL (ref 0.7–4.0)
MCHC: 33.3 g/dL (ref 30.0–36.0)
MCV: 90.3 fl (ref 78.0–100.0)
Monocytes Absolute: 0.6 10*3/uL (ref 0.1–1.0)
Monocytes Relative: 8.2 % (ref 3.0–12.0)
Neutro Abs: 3.9 10*3/uL (ref 1.4–7.7)
Neutrophils Relative %: 56 % (ref 43.0–77.0)
Platelets: 309 10*3/uL (ref 150.0–400.0)
RBC: 4.55 Mil/uL (ref 3.87–5.11)
RDW: 12.7 % (ref 11.5–15.5)
WBC: 7 10*3/uL (ref 4.0–10.5)

## 2018-10-15 LAB — HEMOGLOBIN A1C: Hgb A1c MFr Bld: 6.7 % — ABNORMAL HIGH (ref 4.6–6.5)

## 2018-10-15 LAB — MICROALBUMIN / CREATININE URINE RATIO
Creatinine,U: 105 mg/dL
Microalb Creat Ratio: 0.7 mg/g (ref 0.0–30.0)
Microalb, Ur: <0.7 mg/dL (ref 0.0–1.9)

## 2018-10-15 LAB — COMPREHENSIVE METABOLIC PANEL
ALT: 31 U/L (ref 0–35)
AST: 21 U/L (ref 0–37)
Albumin: 4.4 g/dL (ref 3.5–5.2)
Alkaline Phosphatase: 79 U/L (ref 39–117)
BUN: 19 mg/dL (ref 6–23)
CO2: 29 mEq/L (ref 19–32)
Calcium: 10.3 mg/dL (ref 8.4–10.5)
Chloride: 104 mEq/L (ref 96–112)
Creatinine, Ser: 0.83 mg/dL (ref 0.40–1.20)
GFR: 69.83 mL/min (ref >60.00)
Glucose, Bld: 132 mg/dL — ABNORMAL HIGH (ref 70–99)
Potassium: 4.9 mEq/L (ref 3.5–5.1)
Sodium: 140 mEq/L (ref 135–145)
Total Bilirubin: 0.6 mg/dL (ref 0.2–1.2)
Total Protein: 6.8 g/dL (ref 6.0–8.3)

## 2018-10-15 LAB — TSH: TSH: 1.1 u[IU]/mL (ref 0.35–4.50)

## 2018-10-15 NOTE — Progress Notes (Signed)
Subjective:     Nicole Richard is a 63 y.o. female and is here for a comprehensive physical exam. The patient reports no problems.  HYPERTENSION   Blood pressure range-not checking   Chest pain- no      Dyspnea- no Lightheadedness- no   Edema- no  Other side effects - no   Medication compliance: good Low salt diet- yes    DIABETES    Blood Sugar ranges-83-200  Polyuria- no New Visual problems- no  Hypoglycemic symptoms- no  Other side effects-no Medication compliance - good Last eye exam- jan 2020 Foot exam- today   HYPERLIPIDEMIA  Medication compliance- good RUQ pain- no  Muscle aches- no Other side effects-no     Social History   Socioeconomic History  . Marital status: Divorced    Spouse name: Not on file  . Number of children: 2  . Years of education: Not on file  . Highest education level: Not on file  Occupational History  . Occupation: [employer]  Social Needs  . Financial resource strain: Not hard at all  . Food insecurity    Worry: Not on file    Inability: Not on file  . Transportation needs    Medical: Not on file    Non-medical: Not on file  Tobacco Use  . Smoking status: Never Smoker  . Smokeless tobacco: Never Used  Substance and Sexual Activity  . Alcohol use: Yes    Comment: rare  . Drug use: No  . Sexual activity: Not Currently    Partners: Male  Lifestyle  . Physical activity    Days per week: Not on file    Minutes per session: Not on file  . Stress: Not on file  Relationships  . Social Herbalist on phone: Not on file    Gets together: Not on file    Attends religious service: Not on file    Active member of club or organization: Not on file    Attends meetings of clubs or organizations: Not on file    Relationship status: Not on file  . Intimate partner violence    Fear of current or ex partner: Not on file    Emotionally abused: Not on file    Physically abused: Not on file    Forced sexual activity:  Not on file  Other Topics Concern  . Not on file  Social History Narrative   Exercise--walking , and pt daily   Health Maintenance  Topic Date Due  . INFLUENZA VACCINE  08/21/2018  . URINE MICROALBUMIN  10/13/2018  . OPHTHALMOLOGY EXAM  01/29/2019  . FOOT EXAM  02/16/2019  . HEMOGLOBIN A1C  02/20/2019  . MAMMOGRAM  09/17/2019  . PAP SMEAR-Modifier  10/10/2019  . TETANUS/TDAP  10/19/2022  . COLONOSCOPY  09/10/2023  . PNEUMOCOCCAL POLYSACCHARIDE VACCINE AGE 56-64 HIGH RISK  Completed  . Hepatitis C Screening  Completed  . HIV Screening  Completed    The following portions of the patient's history were reviewed and updated as appropriate:  She  has a past medical history of Allergy, Asthma, Diabetes mellitus without complication ([place]), GERD (gastroesophageal reflux disease), Hyperlipidemia, and Hypertension. She does not have any pertinent problems on file. She  has a past surgical history that includes Tubal ligation; Appendectomy; Abdominal hysterectomy (08/2004); Tonsillectomy; Breast enhancement surgery (2001); Colonoscopy; Polypectomy (2009); Shoulder arthroscopy w/ rotator cuff repair (Right, 06/06/2015); and Augmentation mammaplasty (Bilateral, 2000). Her family history includes Asthma in an other  family member; Breast cancer in her maternal grandmother and mother; Cervical cancer in her maternal grandmother; Colon cancer in her maternal grandmother; Colon polyps in her father; Coronary artery disease in an other family member; Diabetes in her father and mother; Hyperlipidemia in an other family member; Hypertension in an other family member; Irritable bowel syndrome in her mother; Kidney disease in her father; Rheumatic fever in her mother; Uterine cancer in her maternal grandmother. She  reports that she has never smoked. She has never used smokeless tobacco. She reports current alcohol use. She reports that she does not use drugs. She has a current medication list which includes the  following prescription(s): aspirin, atenolol, atorvastatin, calcium citrate-vitamin d, freestyle libre 14 day reader, cranberry-vitamin c, esomeprazole, fenofibrate, flaxseed (linseed), glucose blood, ipratropium-albuterol, janumet xr, montelukast, multivitamin with minerals, onetouch delica lancets 0000000, OVER THE COUNTER MEDICATION, probiotic product, propylene glycol, and mometasone, and the following Facility-Administered Medications: sodium chloride. Current Outpatient Medications on File Prior to Visit  Medication Sig Dispense Refill  . aspirin 81 MG tablet Take 81 mg by mouth at bedtime.     [name] atenolol (TENORMIN) 25 MG tablet TAKE 1 TABLET BY MOUTH EVERY DAY 90 tablet 0  . atorvastatin (LIPITOR) 40 MG tablet TAKE 1 TABLET BY MOUTH EVERYDAY AT BEDTIME 90 tablet 1  . Calcium Citrate-Vitamin D (CALCIUM + D PO) Take 1 tablet by mouth daily.     . Continuous Blood Gluc Receiver (FREESTYLE LIBRE 14 DAY READER) DEVI USE AS DIRECTED 1 Device 0  . CRANBERRY-VITAMIN C PO Take 8,400 mg by mouth daily.    [name] esomeprazole (NEXIUM) 40 MG capsule TAKE ONE CAPSULE BY MOUTH TWICE A DAY BEFORE A MEAL 180 capsule 1  . fenofibrate 160 MG tablet Take 1 tablet (160 mg total) by mouth daily. 90 tablet 1  . Flaxseed, Linseed, (FLAXSEED OIL PO) Take 1,400 mg by mouth daily.    [name] glucose blood (ONE TOUCH ULTRA TEST) test strip One touch ultra blue test strips. Check blood sugar twice daily E.119 200 each 4  . Ipratropium-Albuterol (COMBIVENT RESPIMAT) 20-100 MCG/ACT AERS respimat Inhale 1 puff into the lungs 4 (four) times daily as needed for wheezing or shortness of breath. 4 g 5  . JANUMET XR 50-1000 MG TB24 TAKE 2 TABLETS BY MOUTH EVERY DAY 180 tablet 3  . montelukast (SINGULAIR) 10 MG tablet TAKE 1 TABLET BY MOUTH EVERY DAY 90 tablet 0  . Multiple Vitamin (MULTIVITAMIN WITH MINERALS) TABS tablet Take 1 tablet by mouth daily.    [name] DELICA LANCETS 99991111 MISC Check Blood sugar twice daily. Dx:E11.9 (Patient taking  differently: Check Blood sugar once daily. Dx:E11.9) 100 each 12  . OVER THE COUNTER MEDICATION Elderberry gummies, Two gummies daily.    . Probiotic Product (PROBIOTIC PO) Take 1 capsule by mouth at bedtime.    [name] Propylene Glycol (SYSTANE BALANCE) 0.6 % SOLN Apply 1 drop to eye as needed.    . mometasone (NASONEX) 50 MCG/ACT nasal spray Place 2 sprays into the nose daily. (Patient taking differently: Place 2 sprays into the nose daily as needed (congestion). ) 17 g 2   Current Facility-Administered Medications on File Prior to Visit  Medication Dose Route Frequency Provider Last Rate Last Dose  . 0.9 %  sodium chloride infusion  500 mL Intravenous Once Jackquline Denmark, MD       She is allergic to contrast media [iodinated diagnostic agents]; metamucil [psyllium]; and mustard seed..  Review of Systems Review of  Systems  Constitutional: Negative for activity change, appetite change and fatigue.  HENT: Negative for hearing loss, congestion, tinnitus and ear discharge.  dentist q28m Eyes: Negative for visual disturbance (see optho q1y -- vision corrected to 20/20 with glasses).  Respiratory: Negative for cough, chest tightness and shortness of breath.   Cardiovascular: Negative for chest pain, palpitations and leg swelling.  Gastrointestinal: Negative for abdominal pain, diarrhea, constipation and abdominal distention.  Genitourinary: Negative for urgency, frequency, decreased urine volume and difficulty urinating.  Musculoskeletal: Negative for back pain, arthralgias and gait problem.  Skin: Negative for color change, pallor and rash.  Neurological: Negative for dizziness, light-headedness, numbness and headaches.  Hematological: Negative for adenopathy. Does not bruise/bleed easily.  Psychiatric/Behavioral: Negative for suicidal ideas, confusion, sleep disturbance, self-injury, dysphoric mood, decreased concentration and agitation.       Objective:    BP 136/80 (BP Location: Left Arm,  Patient Position: Sitting, Cuff Size: Normal)   Pulse 70   Temp 97.8 F (36.6 C) (Temporal)   Resp 18   Ht 5' (1.524 m)   Wt 165 lb 3.2 oz (74.9 kg)   SpO2 97%   BMI 32.26 kg/m  General appearance: alert, cooperative, appears stated age and no distress Head: Normocephalic, without obvious abnormality, atraumatic Eyes: conjunctivae/corneas clear. PERRL, EOM's intact. Fundi benign. Ears: normal TM's and external ear canals both ears Nose: Nares normal. Septum midline. Mucosa normal. No drainage or sinus tenderness. Throat: lips, mucosa, and tongue normal; teeth and gums normal Neck: no adenopathy, no carotid bruit, no JVD, supple, symmetrical, trachea midline and thyroid not enlarged, symmetric, no tenderness/mass/nodules Back: symmetric, no curvature. ROM normal. No CVA tenderness. Lungs: clear to auscultation bilaterally Breasts: normal appearance, no masses or tenderness Heart: regular rate and rhythm, S1, S2 normal, no murmur, click, rub or gallop Abdomen: soft, non-tender; bowel sounds normal; no masses,  no organomegaly Pelvic: deferred Extremities: extremities normal, atraumatic, no cyanosis or edema Pulses: 2+ and symmetric Skin: Skin color, texture, turgor normal. No rashes or lesions Lymph nodes: Cervical, supraclavicular, and axillary nodes normal. Neurologic: Alert and oriented X 3, normal strength and tone. Normal symmetric reflexes. Normal coordination and gait    Assessment:    Healthy female exam.      Plan:     See After Visit Summary for Counseling Recommendations

## 2018-10-15 NOTE — Patient Instructions (Signed)

## 2018-10-17 ENCOUNTER — Other Ambulatory Visit: Payer: Self-pay | Admitting: Family Medicine

## 2018-10-17 DIAGNOSIS — E1169 Type 2 diabetes mellitus with other specified complication: Secondary | ICD-10-CM

## 2018-10-17 DIAGNOSIS — E785 Hyperlipidemia, unspecified: Secondary | ICD-10-CM

## 2018-10-17 DIAGNOSIS — E1165 Type 2 diabetes mellitus with hyperglycemia: Secondary | ICD-10-CM

## 2018-10-20 DIAGNOSIS — Z20828 Contact with and (suspected) exposure to other viral communicable diseases: Secondary | ICD-10-CM | POA: Diagnosis not present

## 2018-10-27 DIAGNOSIS — Z20828 Contact with and (suspected) exposure to other viral communicable diseases: Secondary | ICD-10-CM | POA: Diagnosis not present

## 2018-12-04 ENCOUNTER — Other Ambulatory Visit: Payer: Self-pay | Admitting: Family Medicine

## 2018-12-04 DIAGNOSIS — J302 Other seasonal allergic rhinitis: Secondary | ICD-10-CM

## 2018-12-04 DIAGNOSIS — I10 Essential (primary) hypertension: Secondary | ICD-10-CM

## 2019-01-04 ENCOUNTER — Other Ambulatory Visit: Payer: Self-pay | Admitting: Family Medicine

## 2019-01-04 ENCOUNTER — Encounter: Payer: Self-pay | Admitting: Family Medicine

## 2019-01-04 DIAGNOSIS — K219 Gastro-esophageal reflux disease without esophagitis: Secondary | ICD-10-CM

## 2019-01-07 ENCOUNTER — Other Ambulatory Visit (INDEPENDENT_AMBULATORY_CARE_PROVIDER_SITE_OTHER): Payer: BC Managed Care – PPO

## 2019-01-07 ENCOUNTER — Other Ambulatory Visit: Payer: Self-pay

## 2019-01-07 DIAGNOSIS — E785 Hyperlipidemia, unspecified: Secondary | ICD-10-CM

## 2019-01-07 DIAGNOSIS — E1165 Type 2 diabetes mellitus with hyperglycemia: Secondary | ICD-10-CM

## 2019-01-07 DIAGNOSIS — E1169 Type 2 diabetes mellitus with other specified complication: Secondary | ICD-10-CM | POA: Diagnosis not present

## 2019-01-07 LAB — COMPREHENSIVE METABOLIC PANEL
ALT: 33 U/L (ref 0–35)
AST: 22 U/L (ref 0–37)
Albumin: 4.5 g/dL (ref 3.5–5.2)
Alkaline Phosphatase: 84 U/L (ref 39–117)
BUN: 21 mg/dL (ref 6–23)
CO2: 30 mEq/L (ref 19–32)
Calcium: 9.9 mg/dL (ref 8.4–10.5)
Chloride: 106 mEq/L (ref 96–112)
Creatinine, Ser: 0.94 mg/dL (ref 0.40–1.20)
GFR: 60.44 mL/min (ref >60.00)
Glucose, Bld: 139 mg/dL — ABNORMAL HIGH (ref 70–99)
Potassium: 4.7 mEq/L (ref 3.5–5.1)
Sodium: 142 mEq/L (ref 135–145)
Total Bilirubin: 0.6 mg/dL (ref 0.2–1.2)
Total Protein: 7.3 g/dL (ref 6.0–8.3)

## 2019-01-07 LAB — LIPID PANEL
Cholesterol: 151 mg/dL (ref 0–200)
HDL: 44.7 mg/dL (ref >39.00)
LDL Cholesterol: 85 mg/dL (ref 0–99)
NonHDL: 106.12
Total CHOL/HDL Ratio: 3
Triglycerides: 106 mg/dL (ref 0.0–149.0)
VLDL: 21.2 mg/dL (ref 0.0–40.0)

## 2019-01-07 LAB — HEMOGLOBIN A1C: Hgb A1c MFr Bld: 6.7 % — ABNORMAL HIGH (ref 4.6–6.5)

## 2019-02-04 ENCOUNTER — Other Ambulatory Visit: Payer: Self-pay | Admitting: Family Medicine

## 2019-02-04 DIAGNOSIS — E1169 Type 2 diabetes mellitus with other specified complication: Secondary | ICD-10-CM

## 2019-02-04 DIAGNOSIS — E785 Hyperlipidemia, unspecified: Secondary | ICD-10-CM

## 2019-02-11 ENCOUNTER — Encounter: Payer: Self-pay | Admitting: Family Medicine

## 2019-02-11 DIAGNOSIS — E1169 Type 2 diabetes mellitus with other specified complication: Secondary | ICD-10-CM

## 2019-02-11 MED ORDER — FENOFIBRATE 160 MG PO TABS: 160.0000 mg | ORAL_TABLET | Freq: Every day | ORAL | 0 refills | Status: DC

## 2019-02-23 ENCOUNTER — Other Ambulatory Visit: Payer: Self-pay

## 2019-02-25 ENCOUNTER — Ambulatory Visit: Payer: BC Managed Care – PPO | Admitting: Endocrinology

## 2019-02-25 ENCOUNTER — Other Ambulatory Visit: Payer: Self-pay

## 2019-02-25 ENCOUNTER — Encounter: Payer: Self-pay | Admitting: Endocrinology

## 2019-02-25 VITALS — BP 128/88 | HR 67 | Ht 60.0 in | Wt 164.0 lb

## 2019-02-25 DIAGNOSIS — E119 Type 2 diabetes mellitus without complications: Secondary | ICD-10-CM

## 2019-02-25 DIAGNOSIS — IMO0002 Reserved for concepts with insufficient information to code with codable children: Secondary | ICD-10-CM

## 2019-02-25 DIAGNOSIS — E1151 Type 2 diabetes mellitus with diabetic peripheral angiopathy without gangrene: Secondary | ICD-10-CM

## 2019-02-25 DIAGNOSIS — E1165 Type 2 diabetes mellitus with hyperglycemia: Secondary | ICD-10-CM | POA: Diagnosis not present

## 2019-02-25 LAB — POCT GLYCOSYLATED HEMOGLOBIN (HGB A1C): Hemoglobin A1C: 6.4 % — AB (ref 4.0–5.6)

## 2019-02-25 MED ORDER — BROMOCRIPTINE MESYLATE 2.5 MG PO TABS: ORAL_TABLET | ORAL | 3 refills | Status: DC

## 2019-02-25 NOTE — Progress Notes (Signed)
Subjective:    Patient ID: Nicole Richard, female    DOB: 14-Jul-1955, 64 y.o.   MRN: [id number]  HPI Pt returns for f/u of diabetes mellitus: DM type: 2 Dx'ed: 123456 Complications: none Therapy: 2 oral meds.  GDM: never DKA: never Severe hypoglycemia: never.  Pancreatitis: never.  Other: she has never taken insulin.  Interval history: pt states she feels well in general.  We are unable to access continuous glucose monitor info, but pt says glucose varies from 90-200.   Past Medical History:  Diagnosis Date  . Allergy    seasonal  . Asthma   . Diabetes mellitus without complication ([place])   . GERD (gastroesophageal reflux disease)   . Hyperlipidemia   . Hypertension     Past Surgical History:  Procedure Laterality Date  . ABDOMINAL HYSTERECTOMY  08/2004  . APPENDECTOMY    . AUGMENTATION MAMMAPLASTY Bilateral 2000  . BREAST ENHANCEMENT SURGERY  2001  . COLONOSCOPY    . POLYPECTOMY  2009   sigmoid polyps  . SHOULDER ARTHROSCOPY W/ ROTATOR CUFF REPAIR Right 06/06/2015   [facility]  . TONSILLECTOMY    . TUBAL LIGATION      Social History   Socioeconomic History  . Marital status: Divorced    Spouse name: Not on file  . Number of children: 2  . Years of education: Not on file  . Highest education level: Not on file  Occupational History  . Occupation: [employer]  Tobacco Use  . Smoking status: Never Smoker  . Smokeless tobacco: Never Used  Substance and Sexual Activity  . Alcohol use: Yes    Comment: rare  . Drug use: No  . Sexual activity: Not Currently    Partners: Male  Other Topics Concern  . Not on file  Social History Narrative   Buyer, retail , and pt daily   Social Determinants of Health   Financial Resource Strain:   . Difficulty of Paying Living Expenses: Not on file  Food Insecurity:   . Worried About Charity fundraiser in the Last Year: Not on file  . Ran Out of Food in the Last Year: Not on file  Transportation Needs:   .  Lack of Transportation (Medical): Not on file  . Lack of Transportation (Non-Medical): Not on file  Physical Activity:   . Days of Exercise per Week: Not on file  . Minutes of Exercise per Session: Not on file  Stress:   . Feeling of Stress : Not on file  Social Connections:   . Frequency of Communication with Friends and Family: Not on file  . Frequency of Social Gatherings with Friends and Family: Not on file  . Attends Religious Services: Not on file  . Active Member of Clubs or Organizations: Not on file  . Attends Archivist Meetings: Not on file  . Marital Status: Not on file  Intimate Partner Violence:   . Fear of Current or Ex-Partner: Not on file  . Emotionally Abused: Not on file  . Physically Abused: Not on file  . Sexually Abused: Not on file    Current Outpatient Medications on File Prior to Visit  Medication Sig Dispense Refill  . aspirin 81 MG tablet Take 81 mg by mouth at bedtime.     [name] atenolol (TENORMIN) 25 MG tablet TAKE 1 TABLET BY MOUTH EVERY DAY 90 tablet 0  . atorvastatin (LIPITOR) 40 MG tablet TAKE 1 TABLET BY MOUTH EVERYDAY AT BEDTIME 90 tablet 1  .  Calcium Citrate-Vitamin D (CALCIUM + D PO) Take 1 tablet by mouth daily.     . Continuous Blood Gluc Receiver (FREESTYLE LIBRE 14 DAY READER) DEVI USE AS DIRECTED 1 Device 0  . CRANBERRY-VITAMIN C PO Take 8,400 mg by mouth daily.    [name] esomeprazole (NEXIUM) 40 MG capsule TAKE ONE CAPSULE BY MOUTH TWICE A DAY BEFORE A MEAL 180 capsule 1  . fenofibrate 160 MG tablet Take 1 tablet (160 mg total) by mouth daily. 90 tablet 0  . Flaxseed, Linseed, (FLAXSEED OIL PO) Take 1,400 mg by mouth daily.    [name] glucose blood (ONE TOUCH ULTRA TEST) test strip One touch ultra blue test strips. Check blood sugar twice daily E.119 200 each 4  . Ipratropium-Albuterol (COMBIVENT RESPIMAT) 20-100 MCG/ACT AERS respimat Inhale 1 puff into the lungs 4 (four) times daily as needed for wheezing or shortness of breath. 4 g 5  .  JANUMET XR 50-1000 MG TB24 TAKE 2 TABLETS BY MOUTH EVERY DAY 180 tablet 3  . montelukast (SINGULAIR) 10 MG tablet TAKE 1 TABLET BY MOUTH EVERY DAY 90 tablet 0  . Multiple Vitamin (MULTIVITAMIN WITH MINERALS) TABS tablet Take 1 tablet by mouth daily.    [name] DELICA LANCETS 99991111 MISC Check Blood sugar twice daily. Dx:E11.9 (Patient taking differently: Check Blood sugar once daily. Dx:E11.9) 100 each 12  . OVER THE COUNTER MEDICATION Elderberry gummies, Two gummies daily.    . Probiotic Product (PROBIOTIC PO) Take 1 capsule by mouth at bedtime.    [name] Propylene Glycol (SYSTANE BALANCE) 0.6 % SOLN Apply 1 drop to eye as needed.    . mometasone (NASONEX) 50 MCG/ACT nasal spray Place 2 sprays into the nose daily. (Patient taking differently: Place 2 sprays into the nose daily as needed (congestion). ) 17 g 2   Current Facility-Administered Medications on File Prior to Visit  Medication Dose Route Frequency Provider Last Rate Last Admin  . 0.9 %  sodium chloride infusion  500 mL Intravenous Once Jackquline Denmark, MD        Allergies  Allergen Reactions  . Contrast Media [Iodinated Diagnostic Agents] Anaphylaxis  . Metamucil [Psyllium] Anaphylaxis  . Mustard Seed Anaphylaxis    Family History  Problem Relation Age of Onset  . Rheumatic fever Mother   . Irritable bowel syndrome Mother   . Breast cancer Mother   . Diabetes Mother   . Diabetes Father   . Colon polyps Father   . Kidney disease Father   . Coronary artery disease Other   . Hyperlipidemia Other   . Hypertension Other   . Asthma Other   . Cervical cancer Maternal Grandmother   . Breast cancer Maternal Grandmother   . Colon cancer Maternal Grandmother   . Uterine cancer Maternal Grandmother   . Esophageal cancer Neg Hx   . Rectal cancer Neg Hx   . Stomach cancer Neg Hx     BP 128/88 (BP Location: Left Arm, Patient Position: Sitting, Cuff Size: Normal)   Pulse 67   Ht 5' (1.524 m)   Wt 164 lb (74.4 kg)   SpO2 97%   BMI  32.03 kg/m    Review of Systems She denies hypoglycemia.     Objective:   Physical Exam VITAL SIGNS:  See vs page GENERAL: no distress Pulses: dorsalis pedis intact bilat.   MSK: no deformity of the feet CV: no leg edema Skin:  no ulcer on the feet.  normal color and temp on the feet.  bilat calluses.  Neuro: sensation is intact to touch on the feet Ext: there is bilateral onychomycosis of the toenails.     Lab Results  Component Value Date   HGBA1C 6.4 (A) 02/25/2019   Lab Results  Component Value Date   CREATININE 0.94 01/07/2019   BUN 21 01/07/2019   NA 142 01/07/2019   K 4.7 01/07/2019   CL 106 01/07/2019   CO2 30 01/07/2019        Assessment & Plan:  Type 2 DM: she would benefit from increased rx, if it can be done with a regimen that avoids or minimizes hypoglycemia.  Patient Instructions  check your blood sugar once a day.  vary the time of day when you check, between before the 3 meals, and at bedtime.  also check if you have symptoms of your blood sugar being too high or too low.  please keep a record of the readings and bring it to your next appointment here.  You can write it on any piece of paper.  please call us sooner if your blood sugar goes below 70, or if you have a lot of readings over 200.  I have sent a prescription to your pharmacy, to resume the bromocriptine.   continue the same janumet.  Please come back for a follow-up appointment in 6 months.

## 2019-02-25 NOTE — Patient Instructions (Addendum)
check your blood sugar once a day.  vary the time of day when you check, between before the 3 meals, and at bedtime.  also check if you have symptoms of your blood sugar being too high or too low.  please keep a record of the readings and bring it to your next appointment here.  You can write it on any piece of paper.  please call us sooner if your blood sugar goes below 70, or if you have a lot of readings over 200.  I have sent a prescription to your pharmacy, to resume the bromocriptine.   continue the same janumet.  Please come back for a follow-up appointment in 6 months.

## 2019-03-12 LAB — HM DIABETES EYE EXAM

## 2019-03-19 ENCOUNTER — Encounter: Payer: Self-pay | Admitting: Family Medicine

## 2019-03-19 DIAGNOSIS — I10 Essential (primary) hypertension: Secondary | ICD-10-CM

## 2019-03-19 DIAGNOSIS — J302 Other seasonal allergic rhinitis: Secondary | ICD-10-CM

## 2019-03-21 MED ORDER — MONTELUKAST SODIUM 10 MG PO TABS: 10.0000 mg | ORAL_TABLET | Freq: Every day | ORAL | 0 refills | Status: DC

## 2019-03-21 MED ORDER — ATENOLOL 25 MG PO TABS: 25.0000 mg | ORAL_TABLET | Freq: Every day | ORAL | 0 refills | Status: DC

## 2019-03-27 ENCOUNTER — Encounter: Payer: Self-pay | Admitting: Endocrinology

## 2019-03-28 ENCOUNTER — Other Ambulatory Visit: Payer: Self-pay

## 2019-03-28 DIAGNOSIS — E1151 Type 2 diabetes mellitus with diabetic peripheral angiopathy without gangrene: Secondary | ICD-10-CM

## 2019-03-28 DIAGNOSIS — IMO0002 Reserved for concepts with insufficient information to code with codable children: Secondary | ICD-10-CM

## 2019-03-28 MED ORDER — JANUMET XR 50-1000 MG PO TB24: 2.0000 | ORAL_TABLET | Freq: Every day | ORAL | 2 refills | Status: DC

## 2019-03-28 NOTE — Telephone Encounter (Signed)
Please advise if you agree to refilling for 1 year

## 2019-04-14 ENCOUNTER — Encounter: Payer: Self-pay | Admitting: Family Medicine

## 2019-04-14 ENCOUNTER — Other Ambulatory Visit: Payer: Self-pay

## 2019-04-15 ENCOUNTER — Encounter: Payer: Self-pay | Admitting: Family Medicine

## 2019-04-15 ENCOUNTER — Ambulatory Visit: Payer: Self-pay | Admitting: Family Medicine

## 2019-04-15 ENCOUNTER — Other Ambulatory Visit: Payer: Self-pay

## 2019-04-15 VITALS — BP 120/72 | HR 80 | Temp 98.0°F | Resp 18 | Ht 60.0 in | Wt 163.8 lb

## 2019-04-15 DIAGNOSIS — E785 Hyperlipidemia, unspecified: Secondary | ICD-10-CM

## 2019-04-15 DIAGNOSIS — I10 Essential (primary) hypertension: Secondary | ICD-10-CM

## 2019-04-15 DIAGNOSIS — E1169 Type 2 diabetes mellitus with other specified complication: Secondary | ICD-10-CM

## 2019-04-15 DIAGNOSIS — D229 Melanocytic nevi, unspecified: Secondary | ICD-10-CM

## 2019-04-15 DIAGNOSIS — E1165 Type 2 diabetes mellitus with hyperglycemia: Secondary | ICD-10-CM

## 2019-04-15 HISTORY — DX: Type 2 diabetes mellitus with other specified complication: E11.69

## 2019-04-15 HISTORY — DX: Type 2 diabetes mellitus with hyperglycemia: E11.65

## 2019-04-15 LAB — COMPREHENSIVE METABOLIC PANEL
ALT: 32 U/L (ref 0–35)
AST: 21 U/L (ref 0–37)
Albumin: 4.5 g/dL (ref 3.5–5.2)
Alkaline Phosphatase: 91 U/L (ref 39–117)
BUN: 18 mg/dL (ref 6–23)
CO2: 30 mEq/L (ref 19–32)
Calcium: 10 mg/dL (ref 8.4–10.5)
Chloride: 107 mEq/L (ref 96–112)
Creatinine, Ser: 0.81 mg/dL (ref 0.40–1.20)
GFR: 71.71 mL/min (ref >60.00)
Glucose, Bld: 123 mg/dL — ABNORMAL HIGH (ref 70–99)
Potassium: 4.8 mEq/L (ref 3.5–5.1)
Sodium: 142 mEq/L (ref 135–145)
Total Bilirubin: 0.4 mg/dL (ref 0.2–1.2)
Total Protein: 7 g/dL (ref 6.0–8.3)

## 2019-04-15 LAB — LIPID PANEL
Cholesterol: 171 mg/dL (ref 0–200)
HDL: 46 mg/dL (ref >39.00)
LDL Cholesterol: 99 mg/dL (ref 0–99)
NonHDL: 125.33
Total CHOL/HDL Ratio: 4
Triglycerides: 130 mg/dL (ref 0.0–149.0)
VLDL: 26 mg/dL (ref 0.0–40.0)

## 2019-04-15 LAB — MICROALBUMIN / CREATININE URINE RATIO
Creatinine,U: 79.6 mg/dL
Microalb Creat Ratio: 0.9 mg/g (ref 0.0–30.0)
Microalb, Ur: <0.7 mg/dL (ref 0.0–1.9)

## 2019-04-15 LAB — HEMOGLOBIN A1C: Hgb A1c MFr Bld: 6.7 % — ABNORMAL HIGH (ref 4.6–6.5)

## 2019-04-15 NOTE — Assessment & Plan Note (Signed)
hgba1c to be checked, minimize simple carbs. Increase exercise as tolerated. Continue current meds  

## 2019-04-15 NOTE — Assessment & Plan Note (Signed)
Tolerating statin, encouraged heart healthy diet, avoid trans fats, minimize simple carbs and saturated fats. Increase exercise as tolerated 

## 2019-04-15 NOTE — Patient Instructions (Signed)
Carbohydrate Counting for Diabetes Mellitus, Adult  Carbohydrate counting is a method of keeping track of how many carbohydrates you eat. Eating carbohydrates naturally increases the amount of sugar (glucose) in the blood. Counting how many carbohydrates you eat helps keep your blood glucose within normal limits, which helps you manage your diabetes (diabetes mellitus). It is important to know how many carbohydrates you can safely have in each meal. This is different for every person. A diet and nutrition specialist (registered dietitian) can help you make a meal plan and calculate how many carbohydrates you should have at each meal and snack. Carbohydrates are found in the following foods:  Grains, such as breads and cereals.  Dried beans and soy products.  Starchy vegetables, such as potatoes, peas, and corn.  Fruit and fruit juices.  Milk and yogurt.  Sweets and snack foods, such as cake, cookies, candy, chips, and soft drinks. How do I count carbohydrates? There are two ways to count carbohydrates in food. You can use either of the methods or a combination of both. Reading "Nutrition Facts" on packaged food The "Nutrition Facts" list is included on the labels of almost all packaged foods and beverages in the [place] It includes:  The serving size.  Information about nutrients in each serving, including the grams (g) of carbohydrate per serving. To use the "Nutrition Facts":  Decide how many servings you will have.  Multiply the number of servings by the number of carbohydrates per serving.  The resulting number is the total amount of carbohydrates that you will be having. Learning standard serving sizes of other foods When you eat carbohydrate foods that are not packaged or do not include "Nutrition Facts" on the label, you need to measure the servings in order to count the amount of carbohydrates:  Measure the foods that you will eat with a food scale or measuring cup, if  needed.  Decide how many standard-size servings you will eat.  Multiply the number of servings by 15. Most carbohydrate-rich foods have about 15 g of carbohydrates per serving. ? For example, if you eat 8 oz (170 g) of strawberries, you will have eaten 2 servings and 30 g of carbohydrates (2 servings x 15 g = 30 g).  For foods that have more than one food mixed, such as soups and casseroles, you must count the carbohydrates in each food that is included. The following list contains standard serving sizes of common carbohydrate-rich foods. Each of these servings has about 15 g of carbohydrates:   hamburger bun or  English muffin.   oz (15 mL) syrup.   oz (14 g) jelly.  1 slice of bread.  1 six-inch tortilla.  3 oz (85 g) cooked rice or pasta.  4 oz (113 g) cooked dried beans.  4 oz (113 g) starchy vegetable, such as peas, corn, or potatoes.  4 oz (113 g) hot cereal.  4 oz (113 g) mashed potatoes or  of a large baked potato.  4 oz (113 g) canned or frozen fruit.  4 oz (120 mL) fruit juice.  4-6 crackers.  6 chicken nuggets.  6 oz (170 g) unsweetened dry cereal.  6 oz (170 g) plain fat-free yogurt or yogurt sweetened with artificial sweeteners.  8 oz (240 mL) milk.  8 oz (170 g) fresh fruit or one small piece of fruit.  24 oz (680 g) popped popcorn. Example of carbohydrate counting Sample meal  3 oz (85 g) chicken breast.  6 oz (170 g)   brown rice.  4 oz (113 g) corn.  8 oz (240 mL) milk.  8 oz (170 g) strawberries with sugar-free whipped topping. Carbohydrate calculation 1. Identify the foods that contain carbohydrates: ? Rice. ? Corn. ? Milk. ? Strawberries. 2. Calculate how many servings you have of each food: ? 2 servings rice. ? 1 serving corn. ? 1 serving milk. ? 1 serving strawberries. 3. Multiply each number of servings by 15 g: ? 2 servings rice x 15 g = 30 g. ? 1 serving corn x 15 g = 15 g. ? 1 serving milk x 15 g = 15 g. ? 1  serving strawberries x 15 g = 15 g. 4. Add together all of the amounts to find the total grams of carbohydrates eaten: ? 30 g + 15 g + 15 g + 15 g = 75 g of carbohydrates total. Summary  Carbohydrate counting is a method of keeping track of how many carbohydrates you eat.  Eating carbohydrates naturally increases the amount of sugar (glucose) in the blood.  Counting how many carbohydrates you eat helps keep your blood glucose within normal limits, which helps you manage your diabetes.  A diet and nutrition specialist (registered dietitian) can help you make a meal plan and calculate how many carbohydrates you should have at each meal and snack. This information is not intended to replace advice given to you by your health care provider. Make sure you discuss any questions you have with your health care provider. Document Revised: 07/31/2016 Document Reviewed: 06/20/2015 Elsevier Patient Education  2020 [employer]  

## 2019-04-15 NOTE — Progress Notes (Signed)
Patient ID: Nicole Richard, female    DOB: [birth year]  Age: 64 y.o. MRN: [id number]    Subjective:  Subjective  HPI Nicole Richard presents for f/u dm, chol and htn.  No complaints  HYPERTENSION   Blood pressure range-not checking   Chest pain- no      Dyspnea- no Lightheadedness- no   Edema- no  Other side effects - no   Medication compliance: good Low salt diet- yes    DIABETES    Blood Sugar ranges-good per pt -- per endo   Polyuria- no New Visual problems- no  Hypoglycemic symptoms- no  Other side effects-no Medication compliance - good Last eye exam- recent  Foot exam- today   HYPERLIPIDEMIA  Medication compliance- good RUQ pain- no  Muscle aches- no Other side effects-no     Review of Systems  Constitutional: Negative for appetite change, diaphoresis, fatigue and unexpected weight change.  Eyes: Negative for pain, redness and visual disturbance.  Respiratory: Negative for cough, chest tightness, shortness of breath and wheezing.   Cardiovascular: Negative for chest pain, palpitations and leg swelling.  Endocrine: Negative for cold intolerance, heat intolerance, polydipsia, polyphagia and polyuria.  Genitourinary: Negative for difficulty urinating, dysuria and frequency.  Neurological: Negative for dizziness, light-headedness, numbness and headaches.    History Past Medical History:  Diagnosis Date  . Allergy    seasonal  . Asthma   . Diabetes mellitus without complication ([place])   . GERD (gastroesophageal reflux disease)   . Hyperlipidemia   . Hypertension     She has a past surgical history that includes Tubal ligation; Appendectomy; Abdominal hysterectomy (08/2004); Tonsillectomy; Breast enhancement surgery (2001); Colonoscopy; Polypectomy (2009); Shoulder arthroscopy w/ rotator cuff repair (Right, 06/06/2015); and Augmentation mammaplasty (Bilateral, 2000).   Her family history includes Asthma in an other family member; Breast cancer in her maternal  grandmother and mother; Cervical cancer in her maternal grandmother; Colon cancer in her maternal grandmother; Colon polyps in her father; Coronary artery disease in an other family member; Diabetes in her father and mother; Hyperlipidemia in an other family member; Hypertension in an other family member; Irritable bowel syndrome in her mother; Kidney disease in her father; Rheumatic fever in her mother; Uterine cancer in her maternal grandmother.She reports that she has never smoked. She has never used smokeless tobacco. She reports current alcohol use. She reports that she does not use drugs.  Current Outpatient Medications on File Prior to Visit  Medication Sig Dispense Refill  . aspirin 81 MG tablet Take 81 mg by mouth at bedtime.     [name] atenolol (TENORMIN) 25 MG tablet Take 1 tablet (25 mg total) by mouth daily. 90 tablet 0  . atorvastatin (LIPITOR) 40 MG tablet TAKE 1 TABLET BY MOUTH EVERYDAY AT BEDTIME 90 tablet 1  . bromocriptine (PARLODEL) 2.5 MG tablet 1/4 tab daily 24 tablet 3  . Calcium Citrate-Vitamin D (CALCIUM + D PO) Take 1 tablet by mouth daily.     . Continuous Blood Gluc Receiver (FREESTYLE LIBRE 14 DAY READER) DEVI USE AS DIRECTED 1 Device 0  . CRANBERRY-VITAMIN C PO Take 8,400 mg by mouth daily.    [name] esomeprazole (NEXIUM) 40 MG capsule TAKE ONE CAPSULE BY MOUTH TWICE A DAY BEFORE A MEAL 180 capsule 1  . fenofibrate 160 MG tablet Take 1 tablet (160 mg total) by mouth daily. 90 tablet 0  . Flaxseed, Linseed, (FLAXSEED OIL PO) Take 1,400 mg by mouth daily.    [name] glucose blood (  ONE TOUCH ULTRA TEST) test strip One touch ultra blue test strips. Check blood sugar twice daily E.119 200 each 4  . Ipratropium-Albuterol (COMBIVENT RESPIMAT) 20-100 MCG/ACT AERS respimat Inhale 1 puff into the lungs 4 (four) times daily as needed for wheezing or shortness of breath. 4 g 5  . montelukast (SINGULAIR) 10 MG tablet Take 1 tablet (10 mg total) by mouth daily. 90 tablet 0  . ONETOUCH DELICA LANCETS  99991111 MISC Check Blood sugar twice daily. Dx:E11.9 (Patient taking differently: Check Blood sugar once daily. Dx:E11.9) 100 each 12  . Probiotic Product (PROBIOTIC PO) Take 1 capsule by mouth at bedtime.    [name] Propylene Glycol (SYSTANE BALANCE) 0.6 % SOLN Apply 1 drop to eye as needed.    . SitaGLIPtin-MetFORMIN HCl (JANUMET XR) 50-1000 MG TB24 Take 2 tablets by mouth daily. 180 tablet 2  . mometasone (NASONEX) 50 MCG/ACT nasal spray Place 2 sprays into the nose daily. (Patient taking differently: Place 2 sprays into the nose daily as needed (congestion). ) 17 g 2   Current Facility-Administered Medications on File Prior to Visit  Medication Dose Route Frequency Provider Last Rate Last Admin  . 0.9 %  sodium chloride infusion  500 mL Intravenous Once Jackquline Denmark, MD         Objective:  Objective  Physical Exam Vitals and nursing note reviewed.  Constitutional:      General: She is not in acute distress.    Appearance: She is well-developed.  HENT:     Head: Normocephalic and atraumatic.     Right Ear: External ear normal.     Left Ear: External ear normal.     Nose: Nose normal.  Eyes:     Conjunctiva/sclera: Conjunctivae normal.     Pupils: Pupils are equal, round, and reactive to light.  Neck:     Thyroid: No thyromegaly.     Vascular: No carotid bruit or JVD.  Cardiovascular:     Rate and Rhythm: Normal rate and regular rhythm.     Heart sounds: Normal heart sounds. No murmur.  Pulmonary:     Effort: Pulmonary effort is normal. No respiratory distress.     Breath sounds: Normal breath sounds. No wheezing or rales.  Chest:     Chest wall: No tenderness.  Musculoskeletal:     Cervical back: Normal range of motion and neck supple.  Skin:    Findings: Lesion present.       Neurological:     Mental Status: She is alert and oriented to person, place, and time.  Psychiatric:        Behavior: Behavior normal.        Thought Content: Thought content normal.        Judgment:  Judgment normal.    Diabetic Foot Exam - Simple   Simple Foot Form Diabetic Foot exam was performed with the following findings: Yes 04/15/2019  8:24 AM  Visual Inspection No deformities, no ulcerations, no other skin breakdown bilaterally: Yes Sensation Testing Intact to touch and monofilament testing bilaterally: Yes Pulse Check Posterior Tibialis and Dorsalis pulse intact bilaterally: Yes Comments     BP 120/72 (BP Location: Left Arm, Patient Position: Sitting, Cuff Size: Large)   Pulse 80   Temp 98 F (36.7 C) (Temporal)   Resp 18   Ht 5' (1.524 m)   Wt 163 lb 12.8 oz (74.3 kg)   SpO2 95%   BMI 31.99 kg/m  Wt Readings from Last 3 Encounters:  04/15/19  163 lb 12.8 oz (74.3 kg)  02/25/19 164 lb (74.4 kg)  10/15/18 165 lb 3.2 oz (74.9 kg)     Lab Results  Component Value Date   WBC 7.0 10/15/2018   HGB 13.7 10/15/2018   HCT 41.1 10/15/2018   PLT 309.0 10/15/2018   GLUCOSE 139 (H) 01/07/2019   CHOL 151 01/07/2019   TRIG 106.0 01/07/2019   HDL 44.70 01/07/2019   LDLDIRECT 128.1 10/05/2013   LDLCALC 85 01/07/2019   ALT 33 01/07/2019   AST 22 01/07/2019   NA 142 01/07/2019   K 4.7 01/07/2019   CL 106 01/07/2019   CREATININE 0.94 01/07/2019   BUN 21 01/07/2019   CO2 30 01/07/2019   TSH 1.10 10/15/2018   HGBA1C 6.4 (A) 02/25/2019   MICROALBUR <0.7 10/15/2018    MM 3D SCREEN BREAST W/IMPLANT BILATERAL  Result Date: 09/17/2018 CLINICAL DATA:  Screening. EXAM: DIGITAL SCREENING BILATERAL MAMMOGRAM WITH IMPLANTS, CAD AND TOMO The patient has retropectoral implants. Standard and implant displaced views were performed. COMPARISON:  Previous exam(s). ACR Breast Density Category b: There are scattered areas of fibroglandular density. FINDINGS: There are no findings suspicious for malignancy. Images were processed with CAD. IMPRESSION: No mammographic evidence of malignancy. A result letter of this screening mammogram will be mailed directly to the patient.  RECOMMENDATION: Screening mammogram in one year. (Code:SM-B-01Y) BI-RADS CATEGORY  1:  Negative. Electronically Signed   By: Curlene Dolphin M.D.   On: 09/17/2018 15:21     Assessment & Plan:  Plan  I have discontinued Phylis A. Dowse's multivitamin with minerals and OVER THE COUNTER MEDICATION. I am also having her maintain her Propylene Glycol, aspirin, Calcium Citrate-Vitamin D (CALCIUM + D PO), OneTouch Delica Lancets 99991111, mometasone, Probiotic Product (PROBIOTIC PO), Ipratropium-Albuterol, FreeStyle Libre 14 Day Reader, CRANBERRY-VITAMIN C PO, (Flaxseed, Linseed, (FLAXSEED OIL PO)), glucose blood, esomeprazole, atorvastatin, fenofibrate, bromocriptine, atenolol, montelukast, and Janumet XR. We will continue to administer sodium chloride.  No orders of the defined types were placed in this encounter.   Problem List Items Addressed This Visit      Unprioritized   Essential hypertension    Well controlled, no changes to meds. Encouraged heart healthy diet such as the DASH diet and exercise as tolerated.       Relevant Orders   Lipid panel   Comprehensive metabolic panel   Microalbumin / creatinine urine ratio   Hyperlipidemia associated with type 2 diabetes mellitus ([place]) - Primary    Tolerating statin, encouraged heart healthy diet, avoid trans fats, minimize simple carbs and saturated fats. Increase exercise as tolerated      Relevant Orders   Lipid panel   Comprehensive metabolic panel   Uncontrolled type 2 diabetes mellitus with hyperglycemia ([place])    hgba1c to be checked, minimize simple carbs. Increase exercise as tolerated. Continue current meds       Relevant Orders   Hemoglobin A1c    Other Visit Diagnoses    Suspicious nevus       Relevant Orders   Ambulatory referral to Dermatology      Follow-up: Return in about 6 months (around 10/16/2019), or if symptoms worsen or fail to improve, for annual exam, fasting.  [name], DO

## 2019-04-15 NOTE — Assessment & Plan Note (Signed)
Well controlled, no changes to meds. Encouraged heart healthy diet such as the DASH diet and exercise as tolerated.  °

## 2019-05-23 ENCOUNTER — Encounter: Payer: Self-pay | Admitting: Family Medicine

## 2019-05-24 ENCOUNTER — Telehealth (INDEPENDENT_AMBULATORY_CARE_PROVIDER_SITE_OTHER): Payer: Self-pay | Admitting: Internal Medicine

## 2019-05-24 ENCOUNTER — Encounter: Payer: Self-pay | Admitting: Internal Medicine

## 2019-05-24 ENCOUNTER — Other Ambulatory Visit: Payer: Self-pay

## 2019-05-24 VITALS — BP 133/84 | Temp 98.7°F | Ht 60.0 in | Wt 147.0 lb

## 2019-05-24 DIAGNOSIS — R21 Rash and other nonspecific skin eruption: Secondary | ICD-10-CM

## 2019-05-24 MED ORDER — DOXYCYCLINE HYCLATE 100 MG PO TABS: 100.0000 mg | ORAL_TABLET | Freq: Two times a day (BID) | ORAL | 0 refills | Status: DC

## 2019-05-24 NOTE — Progress Notes (Signed)
Pre visit review using our clinic review tool, if applicable. No additional management support is needed unless otherwise documented below in the visit note. 

## 2019-05-24 NOTE — Progress Notes (Signed)
Subjective:    Patient ID: Nicole Richard, female    DOB: May 03, 1955, 64 y.o.   MRN: [id number]  DOS:  05/24/2019 Type of visit - description: Virtual Visit via Video Note  I connected with the above patient  by a video enabled telemedicine application and verified that I am speaking with the correct person using two identifiers.   THIS ENCOUNTER IS A VIRTUAL VISIT DUE TO COVID-19 - PATIENT WAS NOT SEEN IN THE OFFICE. PATIENT HAS CONSENTED TO VIRTUAL VISIT / TELEMEDICINE VISIT   Location of patient: home  Location of provider: office  I discussed the limitations of evaluation and management by telemedicine and the availability of in person appointments. The patient expressed understanding and agreed to proceed.  Acute Last week, she worked at the yard, she saw a tick  on the right leg, she pulled  it. Initially had some redness and clear fluid discharge. Now the redness has spread. She sent several pictures.  Review of Systems Denies fever chills No nausea or vomiting Has occasional headaches which is nothing unusual to her No unusual aches or pains   Past Medical History:  Diagnosis Date  . Allergy    seasonal  . Asthma   . Diabetes mellitus without complication ([place])   . GERD (gastroesophageal reflux disease)   . Hyperlipidemia   . Hypertension     Past Surgical History:  Procedure Laterality Date  . ABDOMINAL HYSTERECTOMY  08/2004  . APPENDECTOMY    . AUGMENTATION MAMMAPLASTY Bilateral 2000  . BREAST ENHANCEMENT SURGERY  2001  . COLONOSCOPY    . POLYPECTOMY  2009   sigmoid polyps  . SHOULDER ARTHROSCOPY W/ ROTATOR CUFF REPAIR Right 06/06/2015   [facility]  . TONSILLECTOMY    . TUBAL LIGATION      Allergies as of 05/24/2019      Reactions   Contrast Media [iodinated Diagnostic Agents] Anaphylaxis   Metamucil [psyllium] Anaphylaxis   Mustard Seed Anaphylaxis      Medication List       Accurate as of May 24, 2019  4:03 PM. If you have any questions, ask your  nurse or doctor.        aspirin 81 MG tablet Take 81 mg by mouth at bedtime.   atenolol 25 MG tablet Commonly known as: TENORMIN Take 1 tablet (25 mg total) by mouth daily.   atorvastatin 40 MG tablet Commonly known as: LIPITOR TAKE 1 TABLET BY MOUTH EVERYDAY AT BEDTIME   bromocriptine 2.5 MG tablet Commonly known as: PARLODEL 1/4 tab daily   CALCIUM + D PO Take 1 tablet by mouth daily.   CRANBERRY-VITAMIN C PO Take 8,400 mg by mouth daily.   esomeprazole 40 MG capsule Commonly known as: NEXIUM TAKE ONE CAPSULE BY MOUTH TWICE A DAY BEFORE A MEAL   fenofibrate 160 MG tablet Take 1 tablet (160 mg total) by mouth daily.   FLAXSEED OIL PO Take 1,400 mg by mouth daily.   FreeStyle Libre 14 Day Reader Kerrin Mo USE AS DIRECTED   glucose blood test strip Commonly known as: ONE TOUCH ULTRA TEST One touch ultra blue test strips. Check blood sugar twice daily E.119   Ipratropium-Albuterol 20-100 MCG/ACT Aers respimat Commonly known as: Combivent Respimat Inhale 1 puff into the lungs 4 (four) times daily as needed for wheezing or shortness of breath.   Janumet XR 50-1000 MG Tb24 Generic drug: SitaGLIPtin-MetFORMIN HCl Take 2 tablets by mouth daily.   mometasone 50 MCG/ACT nasal spray Commonly known  as: Nasonex Place 2 sprays into the nose daily. What changed:   when to take this  reasons to take this   montelukast 10 MG tablet Commonly known as: SINGULAIR Take 1 tablet (10 mg total) by mouth daily.   OneTouch Delica Lancets 99991111 Misc Check Blood sugar twice daily. Dx:E11.9 What changed: additional instructions   PROBIOTIC PO Take 1 capsule by mouth at bedtime.   Systane Balance 0.6 % Soln Generic drug: Propylene Glycol Apply 1 drop to eye as needed.          Objective:   Physical Exam BP 133/84   Temp 98.7 F (37.1 C)   Ht 5' (1.524 m)   Wt 147 lb (66.7 kg)   BMI 28.71 kg/m  This is a virtual video visit, the patient is alert oriented x3, in no  apparent distress. See pictures, she has an area of redness at the right leg, seems like the center is slightly less red than the rest of the rash.    Assessment      64 year old female, PMH includes high cholesterol, high blood pressure, allergies, DM, hysterectomy, presents with:  Rash after a tick bite. See pictures, she has rash without other associated symptoms.  The rash seems to be more pale in the center. Most likely has cellulitis but early Lyme disease is possible. Plan: Doxycycline for 10 days, Rx sent, call if she has fever, chills, headaches or if she is not gradually better.    I discussed the assessment and treatment plan with the patient. The patient was provided an opportunity to ask questions and all were answered. The patient agreed with the plan and demonstrated an understanding of the instructions.   The patient was advised to call back or seek an in-person evaluation if the symptoms worsen or if the condition fails to improve as anticipated.

## 2019-07-15 ENCOUNTER — Other Ambulatory Visit: Payer: Self-pay | Admitting: Endocrinology

## 2019-07-15 ENCOUNTER — Encounter: Payer: Self-pay | Admitting: Endocrinology

## 2019-07-15 ENCOUNTER — Other Ambulatory Visit: Payer: Self-pay

## 2019-07-15 ENCOUNTER — Ambulatory Visit: Payer: BC Managed Care – PPO | Admitting: Endocrinology

## 2019-07-15 VITALS — BP 120/70 | HR 76 | Ht 60.0 in | Wt 165.4 lb

## 2019-07-15 DIAGNOSIS — E119 Type 2 diabetes mellitus without complications: Secondary | ICD-10-CM

## 2019-07-15 DIAGNOSIS — E1165 Type 2 diabetes mellitus with hyperglycemia: Secondary | ICD-10-CM

## 2019-07-15 DIAGNOSIS — IMO0002 Reserved for concepts with insufficient information to code with codable children: Secondary | ICD-10-CM

## 2019-07-15 LAB — POCT GLYCOSYLATED HEMOGLOBIN (HGB A1C): Hemoglobin A1C: 7.1 % — AB (ref 4.0–5.6)

## 2019-07-15 MED ORDER — METFORMIN HCL ER 500 MG PO TB24
2000.0000 mg | ORAL_TABLET | Freq: Every day | ORAL | 3 refills | Status: DC
Start: 2019-07-15 — End: 2019-10-21

## 2019-07-15 MED ORDER — RYBELSUS 7 MG PO TABS: 7.0000 mg | ORAL_TABLET | Freq: Every day | ORAL | 3 refills | Status: DC

## 2019-07-15 NOTE — Patient Instructions (Addendum)
check your blood sugar once a day.  vary the time of day when you check, between before the 3 meals, and at bedtime.  also check if you have symptoms of your blood sugar being too high or too low.  please keep a record of the readings and bring it to your next appointment here.  You can write it on any piece of paper.  please call us sooner if your blood sugar goes below 70, or if you have a lot of readings over 200.  I have sent a prescription to your pharmacy, to change Janumet to Rybelsus and metformin--2 separate prescriptions.  Please come back for a follow-up appointment in 3 months.

## 2019-07-15 NOTE — Progress Notes (Signed)
Subjective:    Patient ID: Nicole Richard, female    DOB: [birth year], 64 y.o.   MRN: [id number]  HPI Pt returns for f/u of diabetes mellitus: DM type: 2 Dx'ed: 0258 Complications: none Therapy: 2 oral meds.  GDM: never DKA: never Severe hypoglycemia: never.  Pancreatitis: never.  Other: she has never taken insulin.  Interval history: pt states she feels well in general.  She takes meds as rx'ed.  pt says glucose varies from 112-200.   Past Medical History:  Diagnosis Date  . Allergy    seasonal  . Asthma   . Diabetes mellitus without complication ([place])   . GERD (gastroesophageal reflux disease)   . Hyperlipidemia   . Hypertension     Past Surgical History:  Procedure Laterality Date  . ABDOMINAL HYSTERECTOMY  08/2004  . APPENDECTOMY    . AUGMENTATION MAMMAPLASTY Bilateral 2000  . BREAST ENHANCEMENT SURGERY  2001  . COLONOSCOPY    . POLYPECTOMY  2009   sigmoid polyps  . SHOULDER ARTHROSCOPY W/ ROTATOR CUFF REPAIR Right 06/06/2015   [facility]  . TONSILLECTOMY    . TUBAL LIGATION      Social History   Socioeconomic History  . Marital status: Divorced    Spouse name: Not on file  . Number of children: 2  . Years of education: Not on file  . Highest education level: Not on file  Occupational History  . Occupation: [employer]  Tobacco Use  . Smoking status: Never Smoker  . Smokeless tobacco: Never Used  Substance and Sexual Activity  . Alcohol use: Yes    Comment: rare  . Drug use: No  . Sexual activity: Not Currently    Partners: Male  Other Topics Concern  . Not on file  Social History Narrative   Buyer, retail , and pt daily   Social Determinants of Health   Financial Resource Strain:   . Difficulty of Paying Living Expenses:   Food Insecurity:   . Worried About Charity fundraiser in the Last Year:   . Arboriculturist in the Last Year:   Transportation Needs:   . Film/video editor (Medical):   [name] Lack of Transportation  (Non-Medical):   Physical Activity:   . Days of Exercise per Week:   . Minutes of Exercise per Session:   Stress:   . Feeling of Stress :   Social Connections:   . Frequency of Communication with Friends and Family:   . Frequency of Social Gatherings with Friends and Family:   . Attends Religious Services:   . Active Member of Clubs or Organizations:   . Attends Archivist Meetings:   [name] Marital Status:   Intimate Partner Violence:   . Fear of Current or Ex-Partner:   . Emotionally Abused:   [name] Physically Abused:   . Sexually Abused:     Current Outpatient Medications on File Prior to Visit  Medication Sig Dispense Refill  . aspirin 81 MG tablet Take 81 mg by mouth at bedtime.     [name] atenolol (TENORMIN) 25 MG tablet Take 1 tablet (25 mg total) by mouth daily. 90 tablet 0  . atorvastatin (LIPITOR) 40 MG tablet TAKE 1 TABLET BY MOUTH EVERYDAY AT BEDTIME 90 tablet 1  . bromocriptine (PARLODEL) 2.5 MG tablet 1/4 tab daily 24 tablet 3  . Calcium Citrate-Vitamin D (CALCIUM + D PO) Take 1 tablet by mouth daily.     [name] CRANBERRY-VITAMIN C PO Take 8,400  mg by mouth daily.    [name] esomeprazole (NEXIUM) 40 MG capsule TAKE ONE CAPSULE BY MOUTH TWICE A DAY BEFORE A MEAL 180 capsule 1  . fenofibrate 160 MG tablet Take 1 tablet (160 mg total) by mouth daily. 90 tablet 0  . Flaxseed, Linseed, (FLAXSEED OIL PO) Take 1,400 mg by mouth daily.    [name] glucose blood (ONE TOUCH ULTRA TEST) test strip One touch ultra blue test strips. Check blood sugar twice daily E.119 200 each 4  . Ipratropium-Albuterol (COMBIVENT RESPIMAT) 20-100 MCG/ACT AERS respimat Inhale 1 puff into the lungs 4 (four) times daily as needed for wheezing or shortness of breath. 4 g 5  . montelukast (SINGULAIR) 10 MG tablet Take 1 tablet (10 mg total) by mouth daily. 90 tablet 0  . ONETOUCH DELICA LANCETS 35W MISC Check Blood sugar twice daily. Dx:E11.9 (Patient taking differently: Check Blood sugar once daily. Dx:E11.9) 100 each 12   . Probiotic Product (PROBIOTIC PO) Take 1 capsule by mouth at bedtime.    [name] Propylene Glycol (SYSTANE BALANCE) 0.6 % SOLN Apply 1 drop to eye as needed.    . mometasone (NASONEX) 50 MCG/ACT nasal spray Place 2 sprays into the nose daily. (Patient taking differently: Place 2 sprays into the nose daily as needed (congestion). ) 17 g 2   No current facility-administered medications on file prior to visit.    Allergies  Allergen Reactions  . Contrast Media [Iodinated Diagnostic Agents] Anaphylaxis  . Metamucil [Psyllium] Anaphylaxis  . Mustard Seed Anaphylaxis    Family History  Problem Relation Age of Onset  . Rheumatic fever Mother   . Irritable bowel syndrome Mother   . Breast cancer Mother   . Diabetes Mother   . Diabetes Father   . Colon polyps Father   . Kidney disease Father   . Coronary artery disease Other   . Hyperlipidemia Other   . Hypertension Other   . Asthma Other   . Cervical cancer Maternal Grandmother   . Breast cancer Maternal Grandmother   . Colon cancer Maternal Grandmother   . Uterine cancer Maternal Grandmother   . Esophageal cancer Neg Hx   . Rectal cancer Neg Hx   . Stomach cancer Neg Hx     BP 120/70   Pulse 76   Ht 5' (1.524 m)   Wt 165 lb 6.4 oz (75 kg)   SpO2 96%   BMI 32.30 kg/m    Review of Systems     Objective:   Physical Exam VITAL SIGNS:  See vs page GENERAL: no distress Pulses: dorsalis pedis intact bilat.   MSK: no deformity of the feet CV: no leg edema Skin:  no ulcer on the feet.  normal color and temp on the feet. Neuro: sensation is intact to touch on the feet Ext: there is bilateral onychomycosis of the toenails  Lab Results  Component Value Date   HGBA1C 7.1 (A) 07/15/2019   Lab Results  Component Value Date   CREATININE 0.81 04/15/2019   BUN 18 04/15/2019   NA 142 04/15/2019   K 4.8 04/15/2019   CL 107 04/15/2019   CO2 30 04/15/2019       Assessment & Plan:  Type 2 DM, worse  Patient Instructions   check your blood sugar once a day.  vary the time of day when you check, between before the 3 meals, and at bedtime.  also check if you have symptoms of your blood sugar being too high or too  low.  please keep a record of the readings and bring it to your next appointment here.  You can write it on any piece of paper.  please call us sooner if your blood sugar goes below 70, or if you have a lot of readings over 200.  I have sent a prescription to your pharmacy, to change Janumet to Rybelsus and metformin--2 separate prescriptions.  Please come back for a follow-up appointment in 3 months.

## 2019-07-25 ENCOUNTER — Ambulatory Visit: Payer: BC Managed Care – PPO | Admitting: Dermatology

## 2019-07-25 ENCOUNTER — Other Ambulatory Visit: Payer: Self-pay

## 2019-07-25 ENCOUNTER — Encounter: Payer: Self-pay | Admitting: Dermatology

## 2019-07-25 DIAGNOSIS — D229 Melanocytic nevi, unspecified: Secondary | ICD-10-CM

## 2019-07-25 DIAGNOSIS — L929 Granulomatous disorder of the skin and subcutaneous tissue, unspecified: Secondary | ICD-10-CM | POA: Diagnosis not present

## 2019-07-25 DIAGNOSIS — L920 Granuloma annulare: Secondary | ICD-10-CM

## 2019-07-25 DIAGNOSIS — L821 Other seborrheic keratosis: Secondary | ICD-10-CM

## 2019-07-25 DIAGNOSIS — S80861A Insect bite (nonvenomous), right lower leg, initial encounter: Secondary | ICD-10-CM

## 2019-07-25 DIAGNOSIS — D225 Melanocytic nevi of trunk: Secondary | ICD-10-CM

## 2019-07-25 DIAGNOSIS — W57XXXA Bitten or stung by nonvenomous insect and other nonvenomous arthropods, initial encounter: Secondary | ICD-10-CM

## 2019-07-25 NOTE — Progress Notes (Signed)
   New Patient   Subjective  Nicole Richard is a 64 y.o. female who presents for the following: New Patient (Initial Visit) (Spot on left lower leg x 10 years getting larger.  No bleeding, no pain, no scale, no itching. Check spots on both arms x 5 years no bleeding just scale, on spot on elbow does itch sometimes.).  Growth Location: Left shin Duration: Years Quality: Slightly larger Associated Signs/Symptoms: Modifying Factors:  Severity:  Timing: Context:    The following portions of the chart were reviewed this encounter and updated as appropriate:     Objective  Well appearing patient in no apparent distress; mood and affect are within normal limits.  A focused examination was performed including The face, neck, arms, upper chest, back, legs.. Relevant physical exam findings are noted in the Assessment and Plan.   Assessment & Plan  Granuloma due to tick bite Right Lower Leg - Anterior  Consider future intralesional triamcinolone if itching persists  Seborrheic keratosis Left Lower Leg - Anterior  Patient content to leave the spot unless there is clinical change.  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 Nicole Richard, date of birth [birth year].  Several issues discussed.  At the site of a tick bite on her right lower shin there is a persistent firm slightly inflamed 5 mm bump which fits a tick bite granuloma.  She was given the option of intralesional dilute triamcinolone but for now this is deferred.  On her left shin she has noted slow enlargement over several years of a now 1cm tan rough spot that fits a stucco keratosis.  There are smaller similar lesions on the right upper shin.  The look-alike would be flat warts.  Unless there is obvious clinical change, these would be quite safe to leave.  Examination of sun exposed skin upper chest and back  showed several benign angiomas which require no intervention (2 mm red dots).  Just below each elbow are some small 3 mm dermal papules which would fit the juvenile form of granuloma and you Fritz Pickerel, and themselves require no intervention but may be persistent.  There is a benign keratosis on the right lower neck.

## 2019-07-25 NOTE — Patient Instructions (Signed)
Nicole Richard, date of birth [birth year].  Several issues discussed.  At the site of a tick bite on her right lower shin there is a persistent firm slightly inflamed 5 mm bump which fits a tick bite granuloma.  She was given the option of intralesional dilute triamcinolone but for now this is deferred.  On her left shin she has noted slow enlargement over several years of a now 1cm tan rough spot that fits a stucco keratosis.  There are smaller similar lesions on the right upper shin.  The look-alike would be flat warts.  Unless there is obvious clinical change, these would be quite safe to leave.  Examination of sun exposed skin upper chest and back showed several benign angiomas which require no intervention (2 mm red dots).  Just below each elbow are some small 3 mm dermal papules which would fit the juvenile form of granuloma and you Fritz Pickerel, and themselves require no intervention but may be persistent.  There is a benign keratosis on the right lower neck.

## 2019-08-07 ENCOUNTER — Other Ambulatory Visit: Payer: Self-pay | Admitting: Family Medicine

## 2019-08-07 ENCOUNTER — Encounter: Payer: Self-pay | Admitting: Endocrinology

## 2019-08-07 ENCOUNTER — Encounter: Payer: Self-pay | Admitting: Family Medicine

## 2019-08-07 DIAGNOSIS — K219 Gastro-esophageal reflux disease without esophagitis: Secondary | ICD-10-CM

## 2019-08-07 DIAGNOSIS — E1169 Type 2 diabetes mellitus with other specified complication: Secondary | ICD-10-CM

## 2019-08-08 ENCOUNTER — Telehealth: Payer: Self-pay

## 2019-08-08 NOTE — Telephone Encounter (Signed)
Spoke w/ Enid Derry at [employer] at [phone]. Tried initiated a PA for esomeprazole- informed that this medication is a plan exclusion- there is no way for Enid Derry to submit a PA for this even though Pt has already tried and failed several other medications. Informed Enid Derry I'd let PCP know.

## 2019-08-08 NOTE — Telephone Encounter (Signed)
Please let pt know her ins will not pay for it  We may need to refer to GI for eval -- or she can get nexium otc

## 2019-08-09 NOTE — Telephone Encounter (Signed)
Pt called. VM left with below advise.

## 2019-08-18 ENCOUNTER — Other Ambulatory Visit: Payer: Self-pay | Admitting: Endocrinology

## 2019-08-18 MED ORDER — REPAGLINIDE 1 MG PO TABS: 1.0000 mg | ORAL_TABLET | Freq: Two times a day (BID) | ORAL | 11 refills | Status: DC

## 2019-08-19 ENCOUNTER — Other Ambulatory Visit: Payer: Self-pay | Admitting: Family Medicine

## 2019-08-19 DIAGNOSIS — Z1231 Encounter for screening mammogram for malignant neoplasm of breast: Secondary | ICD-10-CM

## 2019-08-28 ENCOUNTER — Other Ambulatory Visit: Payer: Self-pay | Admitting: Family Medicine

## 2019-08-28 DIAGNOSIS — E785 Hyperlipidemia, unspecified: Secondary | ICD-10-CM

## 2019-09-01 ENCOUNTER — Other Ambulatory Visit: Payer: Self-pay | Admitting: Family Medicine

## 2019-09-01 DIAGNOSIS — I10 Essential (primary) hypertension: Secondary | ICD-10-CM

## 2019-09-09 ENCOUNTER — Other Ambulatory Visit: Payer: Self-pay | Admitting: Endocrinology

## 2019-09-09 ENCOUNTER — Other Ambulatory Visit: Payer: Self-pay | Admitting: Family Medicine

## 2019-09-09 DIAGNOSIS — J302 Other seasonal allergic rhinitis: Secondary | ICD-10-CM

## 2019-09-19 ENCOUNTER — Ambulatory Visit
Admission: RE | Admit: 2019-09-19 | Discharge: 2019-09-19 | Disposition: A | Discharge status: Home / Self Care | Payer: BC Managed Care – PPO | Source: Ambulatory Visit | Attending: Family Medicine | Admitting: Family Medicine

## 2019-09-19 ENCOUNTER — Other Ambulatory Visit: Payer: Self-pay

## 2019-09-19 DIAGNOSIS — Z1231 Encounter for screening mammogram for malignant neoplasm of breast: Secondary | ICD-10-CM

## 2019-10-21 ENCOUNTER — Other Ambulatory Visit: Payer: Self-pay

## 2019-10-21 ENCOUNTER — Encounter: Payer: Self-pay | Admitting: Family Medicine

## 2019-10-21 ENCOUNTER — Ambulatory Visit (INDEPENDENT_AMBULATORY_CARE_PROVIDER_SITE_OTHER): Payer: BC Managed Care – PPO | Admitting: Family Medicine

## 2019-10-21 VITALS — BP 136/78 | HR 64 | Temp 98.2°F | Resp 18 | Ht 60.0 in | Wt 167.4 lb

## 2019-10-21 DIAGNOSIS — E1165 Type 2 diabetes mellitus with hyperglycemia: Secondary | ICD-10-CM

## 2019-10-21 DIAGNOSIS — E1169 Type 2 diabetes mellitus with other specified complication: Secondary | ICD-10-CM | POA: Diagnosis not present

## 2019-10-21 DIAGNOSIS — Z23 Encounter for immunization: Secondary | ICD-10-CM | POA: Diagnosis not present

## 2019-10-21 DIAGNOSIS — E785 Hyperlipidemia, unspecified: Secondary | ICD-10-CM

## 2019-10-21 DIAGNOSIS — Z0000 Encounter for general adult medical examination without abnormal findings: Secondary | ICD-10-CM

## 2019-10-21 DIAGNOSIS — K219 Gastro-esophageal reflux disease without esophagitis: Secondary | ICD-10-CM

## 2019-10-21 MED ORDER — PANTOPRAZOLE SODIUM 40 MG PO TBEC: 40.0000 mg | DELAYED_RELEASE_TABLET | Freq: Every day | ORAL | 3 refills | Status: DC

## 2019-10-21 MED ORDER — ONETOUCH DELICA LANCETS 33G MISC: 12 refills | Status: DC

## 2019-10-21 NOTE — Progress Notes (Signed)
Subjective:     Nicole Richard is a 64 y.o. female and is here for a comprehensive physical exam. The patient reports inc in gerd symptoms The pepcid is not working   Social History   Socioeconomic History  . Marital status: Divorced    Spouse name: Not on file  . Number of children: 2  . Years of education: Not on file  . Highest education level: Not on file  Occupational History  . Occupation: [employer]  Tobacco Use  . Smoking status: Never Smoker  . Smokeless tobacco: Never Used  Substance and Sexual Activity  . Alcohol use: Yes    Comment: rare  . Drug use: No  . Sexual activity: Not Currently    Partners: Male  Other Topics Concern  . Not on file  Social History Narrative   Buyer, retail , and pt daily   Social Determinants of Health   Financial Resource Strain:   . Difficulty of Paying Living Expenses: Not on file  Food Insecurity:   . Worried About Charity fundraiser in the Last Year: Not on file  . Ran Out of Food in the Last Year: Not on file  Transportation Needs:   . Lack of Transportation (Medical): Not on file  . Lack of Transportation (Non-Medical): Not on file  Physical Activity:   . Days of Exercise per Week: Not on file  . Minutes of Exercise per Session: Not on file  Stress:   . Feeling of Stress : Not on file  Social Connections:   . Frequency of Communication with Friends and Family: Not on file  . Frequency of Social Gatherings with Friends and Family: Not on file  . Attends Religious Services: Not on file  . Active Member of Clubs or Organizations: Not on file  . Attends Archivist Meetings: Not on file  . Marital Status: Not on file  Intimate Partner Violence:   . Fear of Current or Ex-Partner: Not on file  . Emotionally Abused: Not on file  . Physically Abused: Not on file  . Sexually Abused: Not on file   Health Maintenance  Topic Date Due  . INFLUENZA VACCINE  08/21/2019  . PAP SMEAR-Modifier  10/10/2019   . HEMOGLOBIN A1C  01/14/2020  . OPHTHALMOLOGY EXAM  03/11/2020  . URINE MICROALBUMIN  04/14/2020  . FOOT EXAM  07/14/2020  . MAMMOGRAM  09/18/2020  . TETANUS/TDAP  10/19/2022  . COLONOSCOPY  09/10/2023  . PNEUMOCOCCAL POLYSACCHARIDE VACCINE AGE 81-64 HIGH RISK  Completed  . COVID-19 Vaccine  Completed  . Hepatitis C Screening  Completed  . HIV Screening  Completed    The following portions of the patient's history were reviewed and updated as appropriate:  She  has a past medical history of Allergy, Asthma, Diabetes mellitus without complication ([place]), GERD (gastroesophageal reflux disease), Hyperlipidemia, and Hypertension. She does not have any pertinent problems on file. She  has a past surgical history that includes Tubal ligation; Appendectomy; Abdominal hysterectomy (08/2004); Tonsillectomy; Breast enhancement surgery (2001); Colonoscopy; Polypectomy (2009); Shoulder arthroscopy w/ rotator cuff repair (Right, 06/06/2015); and Augmentation mammaplasty (Bilateral, 2000). Her family history includes Asthma in an other family member; Breast cancer in her maternal grandmother and mother; Cervical cancer in her maternal grandmother; Colon cancer in her maternal grandmother; Colon polyps in her father; Coronary artery disease in an other family member; Diabetes in her father and mother; Hyperlipidemia in an other family member; Hypertension in an other family member; Irritable bowel  syndrome in her mother; Kidney disease in her father; Rheumatic fever in her mother; Uterine cancer in her maternal grandmother. She  reports that she has never smoked. She has never used smokeless tobacco. She reports current alcohol use. She reports that she does not use drugs. She has a current medication list which includes the following prescription(s): aspirin, atenolol, atorvastatin, bromocriptine, calcium citrate-vitamin d, cranberry-vitamin c, esomeprazole, fenofibrate, flaxseed (linseed), glucose blood,  ipratropium-albuterol, montelukast, onetouch delica lancets 93Z, probiotic product, propylene glycol, repaglinide, and mometasone. Current Outpatient Medications on File Prior to Visit  Medication Sig Dispense Refill  . aspirin 81 MG tablet Take 81 mg by mouth at bedtime.     [name] atenolol (TENORMIN) 25 MG tablet TAKE 1 TABLET BY MOUTH EVERY DAY 90 tablet 0  . atorvastatin (LIPITOR) 40 MG tablet TAKE 1 TABLET BY MOUTH EVERYDAY AT BEDTIME 90 tablet 1  . bromocriptine (PARLODEL) 2.5 MG tablet 1/4 tab daily 24 tablet 3  . Calcium Citrate-Vitamin D (CALCIUM + D PO) Take 1 tablet by mouth daily.     [name] CRANBERRY-VITAMIN C PO Take 8,400 mg by mouth daily.    [name] esomeprazole (NEXIUM) 40 MG capsule TAKE ONE CAPSULE BY MOUTH TWICE A DAY BEFORE A MEAL (Patient taking differently: 20 mg. TAKE ONE CAPSULE BY MOUTH  DAY BEFORE A MEAL) 180 capsule 1  . fenofibrate 160 MG tablet TAKE 1 TABLET BY MOUTH EVERY DAY 90 tablet 1  . Flaxseed, Linseed, (FLAXSEED OIL PO) Take 1,400 mg by mouth daily.    [name] glucose blood (ONE TOUCH ULTRA TEST) test strip One touch ultra blue test strips. Check blood sugar twice daily E.119 200 each 4  . Ipratropium-Albuterol (COMBIVENT RESPIMAT) 20-100 MCG/ACT AERS respimat Inhale 1 puff into the lungs 4 (four) times daily as needed for wheezing or shortness of breath. 4 g 5  . montelukast (SINGULAIR) 10 MG tablet Take 1 tablet (10 mg total) by mouth daily. 90 tablet 3  . ONETOUCH DELICA LANCETS 16R MISC Check Blood sugar twice daily. Dx:E11.9 (Patient taking differently: Check Blood sugar once daily. Dx:E11.9) 100 each 12  . Probiotic Product (PROBIOTIC PO) Take 1 capsule by mouth at bedtime.    [name] Propylene Glycol (SYSTANE BALANCE) 0.6 % SOLN Apply 1 drop to eye as needed.    . repaglinide (PRANDIN) 1 MG tablet Take 1 tablet (1 mg total) by mouth 2 (two) times daily before a meal. 60 tablet 11  . mometasone (NASONEX) 50 MCG/ACT nasal spray Place 2 sprays into the nose daily. (Patient taking  differently: Place 2 sprays into the nose daily as needed (congestion). ) 17 g 2   No current facility-administered medications on file prior to visit.   She is allergic to contrast media [iodinated diagnostic agents], metamucil [psyllium], and mustard seed..  Review of Systems Review of Systems  Constitutional: Negative for activity change, appetite change and fatigue.  HENT: Negative for hearing loss, congestion, tinnitus and ear discharge.  dentist q106m Eyes: Negative for visual disturbance (see optho q1y ) Respiratory: Negative for cough, chest tightness and shortness of breath.   Cardiovascular: Negative for chest pain, palpitations and leg swelling.  Gastrointestinal: Negative for abdominal pain, diarrhea, constipation and abdominal distention.  Genitourinary: Negative for urgency, frequency, decreased urine volume and difficulty urinating.  Musculoskeletal: Negative for back pain, arthralgias and gait problem.  Skin: Negative for color change, pallor and rash.  Neurological: Negative for dizziness, light-headedness, numbness and headaches.  Hematological: Negative for adenopathy. Does not bruise/bleed easily.  Psychiatric/Behavioral: Negative for suicidal ideas, confusion, sleep disturbance, self-injury, dysphoric mood, decreased concentration and agitation.       Objective:    BP 136/78 (BP Location: Right Arm, Patient Position: Sitting, Cuff Size: Large)   Pulse 64   Temp 98.2 F (36.8 C) (Oral)   Resp 18   Ht 5' (1.524 m)   Wt 167 lb 6.4 oz (75.9 kg)   SpO2 97%   BMI 32.69 kg/m  General appearance: alert, cooperative, appears stated age and no distress Head: Normocephalic, without obvious abnormality, atraumatic Eyes: conjunctivae/corneas clear. PERRL, EOM's intact. Fundi benign. Ears: normal TM's and external ear canals both ears Neck: no adenopathy, no carotid bruit, no JVD, supple, symmetrical, trachea midline and thyroid not enlarged, symmetric, no  tenderness/mass/nodules Back: symmetric, no curvature. ROM normal. No CVA tenderness. Lungs: clear to auscultation bilaterally Breasts: normal appearance, no masses or tenderness, positive findings: implants bilaterally Heart: regular rate and rhythm, S1, S2 normal, no murmur, click, rub or gallop Abdomen: soft, non-tender; bowel sounds normal; no masses,  no organomegaly Pelvic: not indicated; status post hysterectomy, negative ROS Extremities: extremities normal, atraumatic, no cyanosis or edema Pulses: 2+ and symmetric Skin: Skin color, texture, turgor normal. No rashes or lesions Lymph nodes: Cervical, supraclavicular, and axillary nodes normal. Neurologic: Alert and oriented X 3, normal strength and tone. Normal symmetric reflexes. Normal coordination and gait    Assessment:    Healthy female exam.      Plan:    ghm utd Check labs  See After Visit Summary for Counseling Recommendations    1. Need for influenza vaccination  - Flu Vaccine QUAD 36+ mos IM  2. Uncontrolled type 2 diabetes mellitus with hyperglycemia ([place]) hgba1c to be checked , minimize simple carbs. Increase exercise as tolerated. Continue current meds  - OneTouch Delica Lancets 33H MISC; Check Blood sugar twice daily. Dx:E11.9  Dispense: 100 each; Refill: 12 - Comprehensive metabolic panel - Hemoglobin A1c - Microalbumin / creatinine urine ratio  3. Gastroesophageal reflux disease, unspecified whether esophagitis present  - Ambulatory referral to Gastroenterology - pantoprazole (PROTONIX) 40 MG tablet; Take 1 tablet (40 mg total) by mouth daily.  Dispense: 30 tablet; Refill: 3  4. Preventative health care See above  - Lipid panel - CBC with Differential/Platelet - TSH - Comprehensive metabolic panel  5. Hyperlipidemia associated with type 2 diabetes mellitus ([place]) Encouraged heart healthy diet, increase exercise, avoid trans fats, consider a krill oil cap daily - Lipid panel - Comprehensive  metabolic panel

## 2019-10-21 NOTE — Patient Instructions (Signed)

## 2019-10-22 LAB — MICROALBUMIN / CREATININE URINE RATIO
Creatinine, Urine: 93 mg/dL (ref 20–275)
Microalb Creat Ratio: 6 mcg/mg creat (ref <30)
Microalb, Ur: 0.6 mg/dL

## 2019-10-22 LAB — CBC WITH DIFFERENTIAL/PLATELET
Absolute Monocytes: 410 cells/uL (ref 200–950)
Basophils Absolute: 39 cells/uL (ref 0–200)
Basophils Relative: 0.6 %
Eosinophils Absolute: 189 cells/uL (ref 15–500)
Eosinophils Relative: 2.9 %
HCT: 43.9 % (ref 35.0–45.0)
Hemoglobin: 14.8 g/dL (ref 11.7–15.5)
Lymphs Abs: 2184 cells/uL (ref 850–3900)
MCH: 30.2 pg (ref 27.0–33.0)
MCHC: 33.7 g/dL (ref 32.0–36.0)
MCV: 89.6 fL (ref 80.0–100.0)
MPV: 11.5 fL (ref 7.5–12.5)
Monocytes Relative: 6.3 %
Neutro Abs: 3679 cells/uL (ref 1500–7800)
Neutrophils Relative %: 56.6 %
Platelets: 334 10*3/uL (ref 140–400)
RBC: 4.9 10*6/uL (ref 3.80–5.10)
RDW: 12.5 % (ref 11.0–15.0)
Total Lymphocyte: 33.6 %
WBC: 6.5 10*3/uL (ref 3.8–10.8)

## 2019-10-22 LAB — LIPID PANEL
Cholesterol: 201 mg/dL — ABNORMAL HIGH (ref <200)
HDL: 58 mg/dL (ref >=50)
LDL Cholesterol (Calc): 113 mg/dL (calc) — ABNORMAL HIGH
Non-HDL Cholesterol (Calc): 143 mg/dL (calc) — ABNORMAL HIGH (ref <130)
Total CHOL/HDL Ratio: 3.5 (calc) (ref <5.0)
Triglycerides: 181 mg/dL — ABNORMAL HIGH (ref <150)

## 2019-10-22 LAB — COMPREHENSIVE METABOLIC PANEL
AG Ratio: 1.7 (calc) (ref 1.0–2.5)
ALT: 33 U/L — ABNORMAL HIGH (ref 6–29)
AST: 22 U/L (ref 10–35)
Albumin: 4.6 g/dL (ref 3.6–5.1)
Alkaline phosphatase (APISO): 125 U/L (ref 37–153)
BUN: 15 mg/dL (ref 7–25)
CO2: 29 mmol/L (ref 20–32)
Calcium: 10.3 mg/dL (ref 8.6–10.4)
Chloride: 102 mmol/L (ref 98–110)
Creat: 0.77 mg/dL (ref 0.50–0.99)
Globulin: 2.7 g/dL (calc) (ref 1.9–3.7)
Glucose, Bld: 174 mg/dL — ABNORMAL HIGH (ref 65–99)
Potassium: 4.8 mmol/L (ref 3.5–5.3)
Sodium: 139 mmol/L (ref 135–146)
Total Bilirubin: 0.5 mg/dL (ref 0.2–1.2)
Total Protein: 7.3 g/dL (ref 6.1–8.1)

## 2019-10-22 LAB — HEMOGLOBIN A1C
Hgb A1c MFr Bld: 8.1 % of total Hgb — ABNORMAL HIGH (ref <5.7)
Mean Plasma Glucose: 186 (calc)
eAG (mmol/L): 10.3 (calc)

## 2019-10-22 LAB — TSH: TSH: 1.03 mIU/L (ref 0.40–4.50)

## 2019-10-23 ENCOUNTER — Other Ambulatory Visit: Payer: Self-pay | Admitting: Family Medicine

## 2019-10-26 ENCOUNTER — Other Ambulatory Visit: Payer: Self-pay | Admitting: Family Medicine

## 2019-10-26 DIAGNOSIS — E785 Hyperlipidemia, unspecified: Secondary | ICD-10-CM

## 2019-10-28 ENCOUNTER — Encounter: Payer: Self-pay | Admitting: Endocrinology

## 2019-10-28 ENCOUNTER — Ambulatory Visit: Payer: BC Managed Care – PPO | Admitting: Endocrinology

## 2019-10-28 ENCOUNTER — Other Ambulatory Visit: Payer: Self-pay

## 2019-10-28 VITALS — BP 140/82 | HR 61 | Ht 60.0 in | Wt 167.0 lb

## 2019-10-28 DIAGNOSIS — E1151 Type 2 diabetes mellitus with diabetic peripheral angiopathy without gangrene: Secondary | ICD-10-CM

## 2019-10-28 DIAGNOSIS — E1165 Type 2 diabetes mellitus with hyperglycemia: Secondary | ICD-10-CM | POA: Diagnosis not present

## 2019-10-28 DIAGNOSIS — IMO0002 Reserved for concepts with insufficient information to code with codable children: Secondary | ICD-10-CM

## 2019-10-28 MED ORDER — RYBELSUS 7 MG PO TABS
7.0000 mg | ORAL_TABLET | Freq: Every day | ORAL | 3 refills | Status: DC
Start: 2019-10-28 — End: 2019-12-28

## 2019-10-28 NOTE — Patient Instructions (Addendum)
check your blood sugar once a day.  vary the time of day when you check, between before the 3 meals, and at bedtime.  also check if you have symptoms of your blood sugar being too high or too low.  please keep a record of the readings and bring it to your next appointment here.  You can write it on any piece of paper.  please call us sooner if your blood sugar goes below 70, or if you have a lot of readings over 200.  Please resume the Rybelsus.  Take just 1/2 pill for the first week or so, to make sure you don't get nausea.  Then go up to a whole pill, and continue the same bromocriptine and repaglinide. please come back for a follow-up appointment in 2 months.

## 2019-10-28 NOTE — Progress Notes (Signed)
Subjective:    Patient ID: Nicole Richard, female    DOB: [birth year], 64 y.o.   MRN: [id number]  HPI Pt returns for f/u of diabetes mellitus: DM type: 2 Dx'ed: 4097 Complications: none Therapy: 2 oral meds.  GDM: never DKA: never Severe hypoglycemia: never.  Pancreatitis: never.  Other: she has never taken insulin.  Interval history: She takes meds as rx'ed.  pt says glucose varies from 150-280.  Pt says the abd bloating and nausea were due to metformin, not Rybelsus. Past Medical History:  Diagnosis Date  . Allergy    seasonal  . Asthma   . Diabetes mellitus without complication ([place])   . GERD (gastroesophageal reflux disease)   . Hyperlipidemia   . Hypertension     Past Surgical History:  Procedure Laterality Date  . ABDOMINAL HYSTERECTOMY  08/2004  . APPENDECTOMY    . AUGMENTATION MAMMAPLASTY Bilateral 2000  . BREAST ENHANCEMENT SURGERY  2001  . COLONOSCOPY    . POLYPECTOMY  2009   sigmoid polyps  . SHOULDER ARTHROSCOPY W/ ROTATOR CUFF REPAIR Right 06/06/2015   [facility]  . TONSILLECTOMY    . TUBAL LIGATION      Social History   Socioeconomic History  . Marital status: Divorced    Spouse name: Not on file  . Number of children: 2  . Years of education: Not on file  . Highest education level: Not on file  Occupational History  . Occupation: [employer]  Tobacco Use  . Smoking status: Never Smoker  . Smokeless tobacco: Never Used  Substance and Sexual Activity  . Alcohol use: Yes    Comment: rare  . Drug use: No  . Sexual activity: Not Currently    Partners: Male  Other Topics Concern  . Not on file  Social History Narrative   Buyer, retail , and pt daily   Social Determinants of Health   Financial Resource Strain:   . Difficulty of Paying Living Expenses: Not on file  Food Insecurity:   . Worried About Charity fundraiser in the Last Year: Not on file  . Ran Out of Food in the Last Year: Not on file  Transportation Needs:   .  Lack of Transportation (Medical): Not on file  . Lack of Transportation (Non-Medical): Not on file  Physical Activity: Insufficiently Active  . Days of Exercise per Week: 3 days  . Minutes of Exercise per Session: 20 min  Stress:   . Feeling of Stress : Not on file  Social Connections:   . Frequency of Communication with Friends and Family: Not on file  . Frequency of Social Gatherings with Friends and Family: Not on file  . Attends Religious Services: Not on file  . Active Member of Clubs or Organizations: Not on file  . Attends Archivist Meetings: Not on file  . Marital Status: Not on file  Intimate Partner Violence:   . Fear of Current or Ex-Partner: Not on file  . Emotionally Abused: Not on file  . Physically Abused: Not on file  . Sexually Abused: Not on file    Current Outpatient Medications on File Prior to Visit  Medication Sig Dispense Refill  . aspirin 81 MG tablet Take 81 mg by mouth at bedtime.     [name] atenolol (TENORMIN) 25 MG tablet TAKE 1 TABLET BY MOUTH EVERY DAY 90 tablet 0  . atorvastatin (LIPITOR) 40 MG tablet TAKE 1 TABLET BY MOUTH EVERYDAY AT BEDTIME 90 tablet 1  .  bromocriptine (PARLODEL) 2.5 MG tablet 1/4 tab daily 24 tablet 3  . Calcium Citrate-Vitamin D (CALCIUM + D PO) Take 1 tablet by mouth daily.     [name] CRANBERRY-VITAMIN C PO Take 8,400 mg by mouth daily.    . fenofibrate 160 MG tablet TAKE 1 TABLET BY MOUTH EVERY DAY 90 tablet 1  . Flaxseed, Linseed, (FLAXSEED OIL PO) Take 1,400 mg by mouth daily.    . Ipratropium-Albuterol (COMBIVENT RESPIMAT) 20-100 MCG/ACT AERS respimat Inhale 1 puff into the lungs 4 (four) times daily as needed for wheezing or shortness of breath. 4 g 5  . montelukast (SINGULAIR) 10 MG tablet Take 1 tablet (10 mg total) by mouth daily. 90 tablet 3  . OneTouch Delica Lancets 20U MISC Check Blood sugar twice daily. Dx:E11.9 100 each 12  . ONETOUCH ULTRA test strip ONE TOUCH ULTRA BLUE TEST STRIPS. CHECK BLOOD SUGAR TWICE DAILY  E.119 100 strip 12  . pantoprazole (PROTONIX) 40 MG tablet Take 1 tablet (40 mg total) by mouth daily. 30 tablet 3  . Probiotic Product (PROBIOTIC PO) Take 1 capsule by mouth at bedtime.    [name] Propylene Glycol (SYSTANE BALANCE) 0.6 % SOLN Apply 1 drop to eye as needed.    . repaglinide (PRANDIN) 1 MG tablet Take 1 tablet (1 mg total) by mouth 2 (two) times daily before a meal. 60 tablet 11  . mometasone (NASONEX) 50 MCG/ACT nasal spray Place 2 sprays into the nose daily. (Patient taking differently: Place 2 sprays into the nose daily as needed (congestion). ) 17 g 2   No current facility-administered medications on file prior to visit.    Allergies  Allergen Reactions  . Contrast Media [Iodinated Diagnostic Agents] Anaphylaxis  . Metamucil [Psyllium] Anaphylaxis  . Mustard Seed Anaphylaxis    Family History  Problem Relation Age of Onset  . Rheumatic fever Mother   . Irritable bowel syndrome Mother   . Breast cancer Mother   . Diabetes Mother   . Diabetes Father   . Colon polyps Father   . Kidney disease Father   . Coronary artery disease Other   . Hyperlipidemia Other   . Hypertension Other   . Asthma Other   . Cervical cancer Maternal Grandmother   . Breast cancer Maternal Grandmother   . Colon cancer Maternal Grandmother   . Uterine cancer Maternal Grandmother   . Esophageal cancer Neg Hx   . Rectal cancer Neg Hx   . Stomach cancer Neg Hx     BP 140/82   Pulse 61   Ht 5' (1.524 m)   Wt 167 lb (75.8 kg)   SpO2 99%   BMI 32.61 kg/m    Review of Systems     Objective:   Physical Exam VITAL SIGNS:  See vs page GENERAL: no distress Pulses: dorsalis pedis intact bilat.   MSK: no deformity of the feet CV: no leg edema Skin:  no ulcer on the feet.  normal color and temp on the feet. Neuro: sensation is intact to touch on the feet Ext: there is bilateral onychomycosis of the toenails    Lab Results  Component Value Date   HGBA1C 8.1 (H) 10/21/2019        Assessment & Plan:  Type 2 DM: uncontrolled  Patient Instructions  check your blood sugar once a day.  vary the time of day when you check, between before the 3 meals, and at bedtime.  also check if you have symptoms of your blood  sugar being too high or too low.  please keep a record of the readings and bring it to your next appointment here.  You can write it on any piece of paper.  please call us sooner if your blood sugar goes below 70, or if you have a lot of readings over 200.  Please resume the Rybelsus.  Take just 1/2 pill for the first week or so, to make sure you don't get nausea.  Then go up to a whole pill, and continue the same bromocriptine and repaglinide. please come back for a follow-up appointment in 2 months.        [name]

## 2019-11-30 ENCOUNTER — Other Ambulatory Visit: Payer: Self-pay | Admitting: Family Medicine

## 2019-11-30 DIAGNOSIS — I10 Essential (primary) hypertension: Secondary | ICD-10-CM

## 2019-12-12 DIAGNOSIS — Z23 Encounter for immunization: Secondary | ICD-10-CM | POA: Diagnosis not present

## 2019-12-28 ENCOUNTER — Other Ambulatory Visit: Payer: Self-pay

## 2019-12-28 ENCOUNTER — Ambulatory Visit: Payer: BC Managed Care – PPO | Admitting: Endocrinology

## 2019-12-28 ENCOUNTER — Encounter: Payer: Self-pay | Admitting: Endocrinology

## 2019-12-28 VITALS — BP 110/62 | HR 73 | Ht 61.0 in | Wt 168.2 lb

## 2019-12-28 DIAGNOSIS — E119 Type 2 diabetes mellitus without complications: Secondary | ICD-10-CM | POA: Diagnosis not present

## 2019-12-28 LAB — POCT GLYCOSYLATED HEMOGLOBIN (HGB A1C): Hemoglobin A1C: 8.4 % — AB (ref 4.0–5.6)

## 2019-12-28 MED ORDER — RYBELSUS 14 MG PO TABS: 14.0000 mg | ORAL_TABLET | Freq: Every day | ORAL | 3 refills | Status: DC

## 2019-12-28 NOTE — Progress Notes (Signed)
Subjective:    Patient ID: Nicole Richard, female    DOB: 1955/03/27, 64 y.o.   MRN: [id number]  HPI Pt returns for f/u of diabetes mellitus: DM type: 2 Dx'ed: 2119 Complications: none Therapy: 3 oral meds.  GDM: never DKA: never Severe hypoglycemia: never.  Pancreatitis: never.  Other: she has never taken insulin; she did not tolerate metformin (abd bloating and nausea).   Interval history: She takes meds as rx'ed.  pt says glucose varies from 180-250.   Past Medical History:  Diagnosis Date  . Allergy    seasonal  . Asthma   . Diabetes mellitus without complication ([place])   . GERD (gastroesophageal reflux disease)   . Hyperlipidemia   . Hypertension     Past Surgical History:  Procedure Laterality Date  . ABDOMINAL HYSTERECTOMY  08/2004  . APPENDECTOMY    . AUGMENTATION MAMMAPLASTY Bilateral 2000  . BREAST ENHANCEMENT SURGERY  2001  . COLONOSCOPY    . POLYPECTOMY  2009   sigmoid polyps  . SHOULDER ARTHROSCOPY W/ ROTATOR CUFF REPAIR Right 06/06/2015   [facility]  . TONSILLECTOMY    . TUBAL LIGATION      Social History   Socioeconomic History  . Marital status: Divorced    Spouse name: Not on file  . Number of children: 2  . Years of education: Not on file  . Highest education level: Not on file  Occupational History  . Occupation: [employer]  Tobacco Use  . Smoking status: Never Smoker  . Smokeless tobacco: Never Used  Substance and Sexual Activity  . Alcohol use: Yes    Comment: rare  . Drug use: No  . Sexual activity: Not Currently    Partners: Male  Other Topics Concern  . Not on file  Social History Narrative   Buyer, retail , and pt daily   Social Determinants of Health   Financial Resource Strain: Not on file  Food Insecurity: Not on file  Transportation Needs: Not on file  Physical Activity: Insufficiently Active  . Days of Exercise per Week: 3 days  . Minutes of Exercise per Session: 20 min  Stress: Not on file  Social  Connections: Not on file  Intimate Partner Violence: Not on file    Current Outpatient Medications on File Prior to Visit  Medication Sig Dispense Refill  . aspirin 81 MG tablet Take 81 mg by mouth at bedtime.     [name] atenolol (TENORMIN) 25 MG tablet TAKE 1 TABLET BY MOUTH EVERY DAY 90 tablet 3  . atorvastatin (LIPITOR) 40 MG tablet TAKE 1 TABLET BY MOUTH EVERYDAY AT BEDTIME 90 tablet 1  . bromocriptine (PARLODEL) 2.5 MG tablet 1/4 tab daily 24 tablet 3  . Calcium Citrate-Vitamin D (CALCIUM + D PO) Take 1 tablet by mouth daily.     [name] CRANBERRY-VITAMIN C PO Take 8,400 mg by mouth daily.    . fenofibrate 160 MG tablet TAKE 1 TABLET BY MOUTH EVERY DAY 90 tablet 1  . Flaxseed, Linseed, (FLAXSEED OIL PO) Take 1,400 mg by mouth daily.    . Ipratropium-Albuterol (COMBIVENT RESPIMAT) 20-100 MCG/ACT AERS respimat Inhale 1 puff into the lungs 4 (four) times daily as needed for wheezing or shortness of breath. 4 g 5  . montelukast (SINGULAIR) 10 MG tablet Take 1 tablet (10 mg total) by mouth daily. 90 tablet 3  . OneTouch Delica Lancets 41D MISC Check Blood sugar twice daily. Dx:E11.9 100 each 12  . ONETOUCH ULTRA test strip ONE TOUCH  ULTRA BLUE TEST STRIPS. CHECK BLOOD SUGAR TWICE DAILY E.119 100 strip 12  . pantoprazole (PROTONIX) 40 MG tablet Take 1 tablet (40 mg total) by mouth daily. 30 tablet 3  . Probiotic Product (PROBIOTIC PO) Take 1 capsule by mouth at bedtime.    [name] Propylene Glycol (SYSTANE BALANCE) 0.6 % SOLN Apply 1 drop to eye as needed.    . repaglinide (PRANDIN) 1 MG tablet Take 1 tablet (1 mg total) by mouth 2 (two) times daily before a meal. 60 tablet 11  . mometasone (NASONEX) 50 MCG/ACT nasal spray Place 2 sprays into the nose daily. (Patient taking differently: Place 2 sprays into the nose daily as needed (congestion). ) 17 g 2   No current facility-administered medications on file prior to visit.    Allergies  Allergen Reactions  . Contrast Media [Iodinated Diagnostic Agents]  Anaphylaxis  . Metamucil [Psyllium] Anaphylaxis  . Mustard Seed Anaphylaxis    Family History  Problem Relation Age of Onset  . Rheumatic fever Mother   . Irritable bowel syndrome Mother   . Breast cancer Mother   . Diabetes Mother   . Diabetes Father   . Colon polyps Father   . Kidney disease Father   . Coronary artery disease Other   . Hyperlipidemia Other   . Hypertension Other   . Asthma Other   . Cervical cancer Maternal Grandmother   . Breast cancer Maternal Grandmother   . Colon cancer Maternal Grandmother   . Uterine cancer Maternal Grandmother   . Esophageal cancer Neg Hx   . Rectal cancer Neg Hx   . Stomach cancer Neg Hx     BP 110/62   Pulse 73   Ht 5\' 1"  (1.549 m)   Wt 168 lb 3.2 oz (76.3 kg)   SpO2 94%   BMI 31.78 kg/m    Review of Systems Denies nausea    Objective:   Physical Exam VITAL SIGNS:  See vs page GENERAL: no distress Pulses: dorsalis pedis intact bilat.   MSK: no deformity of the feet CV: no leg edema Skin:  no ulcer on the feet.  normal color and temp on the feet. Neuro: sensation is intact to touch on the feet Ext: there is bilateral onychomycosis of the toenails.    Lab Results  Component Value Date   HGBA1C 8.4 (A) 12/28/2019      Assessment & Plan:  Type 2 DM: uncontrolled.   Patient Instructions  check your blood sugar once a day.  vary the time of day when you check, between before the 3 meals, and at bedtime.  also check if you have symptoms of your blood sugar being too high or too low.  please keep a record of the readings and bring it to your next appointment here.  You can write it on any piece of paper.  please call us sooner if your blood sugar goes below 70, or if you have a lot of readings over 200.  I have sent a prescription to your pharmacy, to increase the Rybelsus.   Please continue the same other medications Please call if the blood sugar stays high, so we can resume the [place]. please come back for a  follow-up appointment in 2 months.

## 2019-12-28 NOTE — Patient Instructions (Addendum)
check your blood sugar once a day.  vary the time of day when you check, between before the 3 meals, and at bedtime.  also check if you have symptoms of your blood sugar being too high or too low.  please keep a record of the readings and bring it to your next appointment here.  You can write it on any piece of paper.  please call us sooner if your blood sugar goes below 70, or if you have a lot of readings over 200.  I have sent a prescription to your pharmacy, to increase the Rybelsus.   Please continue the same other medications Please call if the blood sugar stays high, so we can resume the [place]. please come back for a follow-up appointment in 2 months.

## 2020-01-01 ENCOUNTER — Encounter: Payer: Self-pay | Admitting: Family Medicine

## 2020-01-02 ENCOUNTER — Encounter: Payer: Self-pay | Admitting: Family Medicine

## 2020-01-23 ENCOUNTER — Other Ambulatory Visit: Payer: Self-pay | Admitting: Family Medicine

## 2020-01-23 DIAGNOSIS — K219 Gastro-esophageal reflux disease without esophagitis: Secondary | ICD-10-CM

## 2020-02-02 ENCOUNTER — Other Ambulatory Visit: Payer: Self-pay | Admitting: Family Medicine

## 2020-02-02 ENCOUNTER — Other Ambulatory Visit: Payer: Self-pay | Admitting: Endocrinology

## 2020-02-02 DIAGNOSIS — E1169 Type 2 diabetes mellitus with other specified complication: Secondary | ICD-10-CM

## 2020-02-14 ENCOUNTER — Encounter: Payer: Self-pay | Admitting: Family Medicine

## 2020-02-14 DIAGNOSIS — K219 Gastro-esophageal reflux disease without esophagitis: Secondary | ICD-10-CM

## 2020-02-15 MED ORDER — PANTOPRAZOLE SODIUM 40 MG PO TBEC: 40.0000 mg | DELAYED_RELEASE_TABLET | Freq: Every day | ORAL | 3 refills | Status: DC

## 2020-03-01 ENCOUNTER — Ambulatory Visit: Payer: BC Managed Care – PPO | Admitting: Endocrinology

## 2020-03-05 ENCOUNTER — Other Ambulatory Visit: Payer: Self-pay | Admitting: Family Medicine

## 2020-03-05 DIAGNOSIS — E1169 Type 2 diabetes mellitus with other specified complication: Secondary | ICD-10-CM

## 2020-03-09 ENCOUNTER — Other Ambulatory Visit: Payer: Self-pay

## 2020-03-09 ENCOUNTER — Ambulatory Visit: Payer: BC Managed Care – PPO | Admitting: Endocrinology

## 2020-03-09 VITALS — BP 130/80 | HR 71 | Ht 61.0 in | Wt 168.6 lb

## 2020-03-09 DIAGNOSIS — E119 Type 2 diabetes mellitus without complications: Secondary | ICD-10-CM

## 2020-03-09 LAB — POCT GLYCOSYLATED HEMOGLOBIN (HGB A1C): Hemoglobin A1C: 7.3 % — AB (ref 4.0–5.6)

## 2020-03-09 MED ORDER — BROMOCRIPTINE MESYLATE 2.5 MG PO TABS: 1.2500 mg | ORAL_TABLET | Freq: Every day | ORAL | 3 refills | Status: DC

## 2020-03-09 NOTE — Patient Instructions (Signed)
I have sent a prescription to your pharmacy, to double the bromocriptine.    Please continue the same other diabetes medications.   check your blood sugar once a day.  vary the time of day when you check, between before the 3 meals, and at bedtime.  also check if you have symptoms of your blood sugar being too high or too low.  please keep a record of the readings and bring it to your next appointment here (or you can bring the meter itself).  You can write it on any piece of paper.  please call us sooner if your blood sugar goes below 70, or if most of your readings are over 200. Please come back for a follow-up appointment in 3 months.

## 2020-03-09 NOTE — Progress Notes (Signed)
Subjective:    Patient ID: Nicole Richard, female    DOB: February 27, 1955, 65 y.o.   MRN: [id number]  HPI Pt returns for f/u of diabetes mellitus: DM type: 2 Dx'ed: 6295 Complications: none Therapy: 3 oral meds.  GDM: never DKA: never Severe hypoglycemia: never.  Pancreatitis: never.  Other: she has never taken insulin; she did not tolerate metformin (abd bloating and nausea).   Interval history: She takes meds as rx'ed.  pt says glucose varies from 154-193.   Past Medical History:  Diagnosis Date  . Allergy    seasonal  . Asthma   . Diabetes mellitus without complication ([place])   . GERD (gastroesophageal reflux disease)   . Hyperlipidemia   . Hypertension     Past Surgical History:  Procedure Laterality Date  . ABDOMINAL HYSTERECTOMY  08/2004  . APPENDECTOMY    . AUGMENTATION MAMMAPLASTY Bilateral 2000  . BREAST ENHANCEMENT SURGERY  2001  . COLONOSCOPY    . POLYPECTOMY  2009   sigmoid polyps  . SHOULDER ARTHROSCOPY W/ ROTATOR CUFF REPAIR Right 06/06/2015   [facility]  . TONSILLECTOMY    . TUBAL LIGATION      Social History   Socioeconomic History  . Marital status: Divorced    Spouse name: Not on file  . Number of children: 2  . Years of education: Not on file  . Highest education level: Not on file  Occupational History  . Occupation: [employer]  Tobacco Use  . Smoking status: Never Smoker  . Smokeless tobacco: Never Used  Substance and Sexual Activity  . Alcohol use: Yes    Comment: rare  . Drug use: No  . Sexual activity: Not Currently    Partners: Male  Other Topics Concern  . Not on file  Social History Narrative   Buyer, retail , and pt daily   Social Determinants of Health   Financial Resource Strain: Not on file  Food Insecurity: Not on file  Transportation Needs: Not on file  Physical Activity: Insufficiently Active  . Days of Exercise per Week: 3 days  . Minutes of Exercise per Session: 20 min  Stress: Not on file  Social  Connections: Not on file  Intimate Partner Violence: Not on file    Current Outpatient Medications on File Prior to Visit  Medication Sig Dispense Refill  . aspirin 81 MG tablet Take 81 mg by mouth at bedtime.     [name] atenolol (TENORMIN) 25 MG tablet TAKE 1 TABLET BY MOUTH EVERY DAY 90 tablet 3  . atorvastatin (LIPITOR) 40 MG tablet TAKE 1 TABLET BY MOUTH EVERYDAY AT BEDTIME 90 tablet 1  . Calcium Citrate-Vitamin D (CALCIUM + D PO) Take 1 tablet by mouth daily.     . fenofibrate 160 MG tablet Take 1 tablet (160 mg total) by mouth daily. 90 tablet 1  . Flaxseed, Linseed, (FLAXSEED OIL PO) Take 1,400 mg by mouth daily.    . Ipratropium-Albuterol (COMBIVENT RESPIMAT) 20-100 MCG/ACT AERS respimat Inhale 1 puff into the lungs 4 (four) times daily as needed for wheezing or shortness of breath. 4 g 5  . montelukast (SINGULAIR) 10 MG tablet Take 1 tablet (10 mg total) by mouth daily. 90 tablet 3  . OneTouch Delica Lancets 28U MISC Check Blood sugar twice daily. Dx:E11.9 100 each 12  . ONETOUCH ULTRA test strip ONE TOUCH ULTRA BLUE TEST STRIPS. CHECK BLOOD SUGAR TWICE DAILY E.119 100 strip 12  . pantoprazole (PROTONIX) 40 MG tablet Take 1 tablet (40  mg total) by mouth daily. 90 tablet 3  . Probiotic Product (PROBIOTIC PO) Take 1 capsule by mouth at bedtime.    [name] Propylene Glycol 0.6 % SOLN Apply 1 drop to eye as needed.    . repaglinide (PRANDIN) 1 MG tablet Take 1 tablet (1 mg total) by mouth 2 (two) times daily before a meal. 60 tablet 11  . Semaglutide (RYBELSUS) 14 MG TABS Take 14 mg by mouth daily. 90 tablet 3  . CRANBERRY-VITAMIN C PO Take 8,400 mg by mouth daily.    . mometasone (NASONEX) 50 MCG/ACT nasal spray Place 2 sprays into the nose daily. (Patient taking differently: Place 2 sprays into the nose daily as needed (congestion). ) 17 g 2   No current facility-administered medications on file prior to visit.    Allergies  Allergen Reactions  . Contrast Media [Iodinated Diagnostic Agents]  Anaphylaxis  . Metamucil [Psyllium] Anaphylaxis  . Mustard Seed Anaphylaxis    Family History  Problem Relation Age of Onset  . Rheumatic fever Mother   . Irritable bowel syndrome Mother   . Breast cancer Mother   . Diabetes Mother   . Diabetes Father   . Colon polyps Father   . Kidney disease Father   . Coronary artery disease Other   . Hyperlipidemia Other   . Hypertension Other   . Asthma Other   . Cervical cancer Maternal Grandmother   . Breast cancer Maternal Grandmother   . Colon cancer Maternal Grandmother   . Uterine cancer Maternal Grandmother   . Esophageal cancer Neg Hx   . Rectal cancer Neg Hx   . Stomach cancer Neg Hx     BP 130/80 (BP Location: Right Arm, Patient Position: Sitting, Cuff Size: Normal)   Pulse 71   Ht 5\' 1"  (1.549 m)   Wt 168 lb 9.6 oz (76.5 kg)   SpO2 96%   BMI 31.86 kg/m    Review of Systems She denies hypoglycemia.    Objective:   Physical Exam VITAL SIGNS:  See vs page GENERAL: no distress Pulses: dorsalis pedis intact bilat.   MSK: no deformity of the feet CV: trace bilat leg edema Skin:  no ulcer on the feet.  normal color and temp on the feet. Neuro: sensation is intact to touch on the feet, but decreased from normal Ext: there is bilateral onychomycosis of the toenails   Lab Results  Component Value Date   HGBA1C 7.3 (A) 03/09/2020   Lab Results  Component Value Date   CREATININE 0.77 10/21/2019   BUN 15 10/21/2019   NA 139 10/21/2019   K 4.8 10/21/2019   CL 102 10/21/2019   CO2 29 10/21/2019      Assessment & Plan:  Type 2 DM: uncontrolled  Patient Instructions  I have sent a prescription to your pharmacy, to double the bromocriptine.    Please continue the same other diabetes medications.   check your blood sugar once a day.  vary the time of day when you check, between before the 3 meals, and at bedtime.  also check if you have symptoms of your blood sugar being too high or too low.  please keep a record of  the readings and bring it to your next appointment here (or you can bring the meter itself).  You can write it on any piece of paper.  please call us sooner if your blood sugar goes below 70, or if most of your readings are over 200. Please come  back for a follow-up appointment in 3 months.

## 2020-03-19 ENCOUNTER — Other Ambulatory Visit: Payer: Self-pay

## 2020-03-19 ENCOUNTER — Ambulatory Visit (HOSPITAL_BASED_OUTPATIENT_CLINIC_OR_DEPARTMENT_OTHER)
Admission: RE | Admit: 2020-03-19 | Discharge: 2020-03-19 | Disposition: A | Discharge status: Home / Self Care | Payer: BC Managed Care – PPO | Source: Ambulatory Visit | Attending: Family Medicine | Admitting: Family Medicine

## 2020-03-19 ENCOUNTER — Ambulatory Visit: Payer: BC Managed Care – PPO | Admitting: Family Medicine

## 2020-03-19 ENCOUNTER — Other Ambulatory Visit: Payer: BC Managed Care – PPO

## 2020-03-19 VITALS — BP 122/60 | HR 74 | Temp 98.8°F | Resp 18 | Wt 167.4 lb

## 2020-03-19 DIAGNOSIS — R059 Cough, unspecified: Secondary | ICD-10-CM | POA: Insufficient documentation

## 2020-03-19 DIAGNOSIS — R079 Chest pain, unspecified: Secondary | ICD-10-CM | POA: Diagnosis not present

## 2020-03-19 DIAGNOSIS — R0789 Other chest pain: Secondary | ICD-10-CM | POA: Diagnosis not present

## 2020-03-19 DIAGNOSIS — R002 Palpitations: Secondary | ICD-10-CM

## 2020-03-19 DIAGNOSIS — Z8249 Family history of ischemic heart disease and other diseases of the circulatory system: Secondary | ICD-10-CM

## 2020-03-19 DIAGNOSIS — I209 Angina pectoris, unspecified: Secondary | ICD-10-CM

## 2020-03-19 DIAGNOSIS — J45901 Unspecified asthma with (acute) exacerbation: Secondary | ICD-10-CM

## 2020-03-19 DIAGNOSIS — E785 Hyperlipidemia, unspecified: Secondary | ICD-10-CM

## 2020-03-19 DIAGNOSIS — I10 Essential (primary) hypertension: Secondary | ICD-10-CM

## 2020-03-19 MED ORDER — COMBIVENT RESPIMAT 20-100 MCG/ACT IN AERS: 1.0000 | INHALATION_SPRAY | Freq: Four times a day (QID) | RESPIRATORY_TRACT | 5 refills | Status: DC | PRN

## 2020-03-19 NOTE — Patient Instructions (Signed)

## 2020-03-19 NOTE — Progress Notes (Signed)
Patient ID: [name], female    DOB: 11/06/1955  Age: 65 y.o. MRN: [id number]    Subjective:  Subjective  HPI Nicole Richard presents for chest pain and palpitations since end of Jan ----  When they buried her mother.,  Her mother  had a stroke  Pain comes and goes -----  Last time she had cp 1 week ago   Review of Systems  Constitutional: Negative for appetite change, diaphoresis, fatigue and unexpected weight change.  Eyes: Negative for pain, redness and visual disturbance.  Respiratory: Negative for cough, chest tightness, shortness of breath and wheezing.   Cardiovascular: Positive for chest pain and palpitations. Negative for leg swelling.  Endocrine: Negative for cold intolerance, heat intolerance, polydipsia, polyphagia and polyuria.  Genitourinary: Negative for difficulty urinating, dysuria and frequency.  Neurological: Negative for dizziness, light-headedness, numbness and headaches.    History Past Medical History:  Diagnosis Date  . Allergy    seasonal  . Asthma   . Diabetes mellitus without complication ([place])   . GERD (gastroesophageal reflux disease)   . Hyperlipidemia   . Hypertension     She has a past surgical history that includes Tubal ligation; Appendectomy; Abdominal hysterectomy (08/2004); Tonsillectomy; Breast enhancement surgery (2001); Colonoscopy; Polypectomy (2009); Shoulder arthroscopy w/ rotator cuff repair (Right, 06/06/2015); and Augmentation mammaplasty (Bilateral, 2000).   Her family history includes Asthma in an other family member; Breast cancer in her maternal grandmother and mother; Cervical cancer in her maternal grandmother; Colon cancer in her maternal grandmother; Colon polyps in her father; Coronary artery disease in an other family member; Diabetes in her father and mother; Hyperlipidemia in an other family member; Hypertension in an other family member; Irritable bowel syndrome in her mother; Kidney disease in her father; Rheumatic fever  in her mother; Uterine cancer in her maternal grandmother.She reports that she has never smoked. She has never used smokeless tobacco. She reports current alcohol use. She reports that she does not use drugs.  Current Outpatient Medications on File Prior to Visit  Medication Sig Dispense Refill  . aspirin 81 MG tablet Take 81 mg by mouth at bedtime.     [name] atenolol (TENORMIN) 25 MG tablet TAKE 1 TABLET BY MOUTH EVERY DAY 90 tablet 3  . atorvastatin (LIPITOR) 40 MG tablet TAKE 1 TABLET BY MOUTH EVERYDAY AT BEDTIME 90 tablet 1  . bromocriptine (PARLODEL) 2.5 MG tablet Take 0.5 tablets (1.25 mg total) by mouth daily. 45 tablet 3  . Calcium Citrate-Vitamin D (CALCIUM + D PO) Take 1 tablet by mouth daily.     . Cholecalciferol 125 MCG (5000 UT) capsule Take 5,000 Units by mouth daily.    [name] CRANBERRY-VITAMIN C PO Take 8,400 mg by mouth daily.    . fenofibrate 160 MG tablet Take 1 tablet (160 mg total) by mouth daily. 90 tablet 1  . Flaxseed, Linseed, (FLAXSEED OIL PO) Take 1,400 mg by mouth daily.    . montelukast (SINGULAIR) 10 MG tablet Take 1 tablet (10 mg total) by mouth daily. 90 tablet 3  . OneTouch Delica Lancets 14H MISC Check Blood sugar twice daily. Dx:E11.9 100 each 12  . ONETOUCH ULTRA test strip ONE TOUCH ULTRA BLUE TEST STRIPS. CHECK BLOOD SUGAR TWICE DAILY E.119 100 strip 12  . pantoprazole (PROTONIX) 40 MG tablet Take 1 tablet (40 mg total) by mouth daily. 90 tablet 3  . Probiotic Product (PROBIOTIC PO) Take 1 capsule by mouth at bedtime.    [name] Propylene Glycol 0.6 %  SOLN Apply 1 drop to eye as needed.    . repaglinide (PRANDIN) 1 MG tablet Take 1 tablet (1 mg total) by mouth 2 (two) times daily before a meal. 60 tablet 11  . Semaglutide (RYBELSUS) 14 MG TABS Take 14 mg by mouth daily. 90 tablet 3  . mometasone (NASONEX) 50 MCG/ACT nasal spray Place 2 sprays into the nose daily. (Patient taking differently: Place 2 sprays into the nose daily as needed (congestion). ) 17 g 2   No  current facility-administered medications on file prior to visit.     Objective:  Objective  Physical Exam Vitals and nursing note reviewed.  Constitutional:      Appearance: She is well-developed and well-nourished.  HENT:     Head: Normocephalic and atraumatic.  Eyes:     Extraocular Movements: EOM normal.     Conjunctiva/sclera: Conjunctivae normal.  Neck:     Thyroid: No thyromegaly.     Vascular: No carotid bruit or JVD.  Cardiovascular:     Rate and Rhythm: Normal rate and regular rhythm.     Heart sounds: Normal heart sounds. No murmur heard.   Pulmonary:     Effort: Pulmonary effort is normal. No respiratory distress.     Breath sounds: Normal breath sounds. No wheezing or rales.  Chest:     Chest wall: No tenderness.  Musculoskeletal:        General: No edema.     Cervical back: Normal range of motion and neck supple.  Neurological:     Mental Status: She is alert and oriented to person, place, and time.  Psychiatric:        Mood and Affect: Mood and affect normal.    BP 122/60 (BP Location: Left Arm, Patient Position: Sitting, Cuff Size: Normal)   Pulse 74   Temp 98.8 F (37.1 C) (Oral)   Resp 18   Wt 167 lb 6.4 oz (75.9 kg)   SpO2 97%   BMI 31.63 kg/m  Wt Readings from Last 3 Encounters:  03/19/20 167 lb 6.4 oz (75.9 kg)  03/09/20 168 lb 9.6 oz (76.5 kg)  12/28/19 168 lb 3.2 oz (76.3 kg)  ekg--  No change from previous ekg        No acute changes    Lab Results  Component Value Date   WBC 6.5 10/21/2019   HGB 14.8 10/21/2019   HCT 43.9 10/21/2019   PLT 334 10/21/2019   GLUCOSE 135 (H) 03/20/2020   CHOL 144 03/20/2020   TRIG 103.0 03/20/2020   HDL 45.40 03/20/2020   LDLDIRECT 128.1 10/05/2013   LDLCALC 78 03/20/2020   ALT 29 03/20/2020   AST 20 03/20/2020   NA 140 03/20/2020   K 4.3 03/20/2020   CL 107 03/20/2020   CREATININE 0.75 03/20/2020   BUN 18 03/20/2020   CO2 25 03/20/2020   TSH 1.03 10/21/2019   HGBA1C 7.3 (A) 03/09/2020    MICROALBUR 0.6 10/21/2019    MM 3D SCREEN BREAST W/IMPLANT BILATERAL  Result Date: 09/20/2019 CLINICAL DATA:  Screening. EXAM: DIGITAL SCREENING BILATERAL MAMMOGRAM WITH IMPLANTS, CAD AND TOMO The patient has retropectoral implants. Standard and implant displaced views were performed. COMPARISON:  Previous exam(s). ACR Breast Density Category b: There are scattered areas of fibroglandular density. FINDINGS: There are no findings suspicious for malignancy. Images were processed with CAD. IMPRESSION: No mammographic evidence of malignancy. A result letter of this screening mammogram will be mailed directly to the patient. RECOMMENDATION: Screening mammogram in one  year. (Code:SM-B-01Y) BI-RADS CATEGORY  1:  Negative. Electronically Signed   By: Audie Pinto M.D.   On: 09/20/2019 12:09     Assessment & Plan:  Plan  I am having Nicole Richard maintain her Propylene Glycol, aspirin, Calcium Citrate-Vitamin D (CALCIUM + D PO), mometasone, Probiotic Product (PROBIOTIC PO), CRANBERRY-VITAMIN C PO, (Flaxseed, Linseed, (FLAXSEED OIL PO)), repaglinide, montelukast, OneTouch Delica Lancets 57K, OneTouch Ultra, atenolol, Rybelsus, atorvastatin, pantoprazole, fenofibrate, bromocriptine, Cholecalciferol, and Combivent Respimat.  Meds ordered this encounter  Medications  . Ipratropium-Albuterol (COMBIVENT RESPIMAT) 20-100 MCG/ACT AERS respimat    Sig: Inhale 1 puff into the lungs 4 (four) times daily as needed for wheezing or shortness of breath.    Dispense:  4 g    Refill:  5    Problem List Items Addressed This Visit      Unprioritized   Angina pectoris ([place])   Essential hypertension    Well controlled, no changes to meds. Encouraged heart healthy diet such as the DASH diet and exercise as tolerated.       Hyperlipidemia    Tolerating statin, encouraged heart healthy diet, avoid trans fats, minimize simple carbs and saturated fats. Increase exercise as tolerated      Other chest pain     Check labs  Echo and refer to cardiology      Relevant Orders   ECHOCARDIOGRAM COMPLETE   Ambulatory referral to Cardiology   CBC with Differential/Platelet   Comprehensive metabolic panel   D-Dimer, Quantitative   Troponin I (High Sensitivity)   DG Chest 2 View (Completed)   Palpitation - Primary    Event monitor       Relevant Orders   Cardiac event monitor    Other Visit Diagnoses    Family history of early CAD       Relevant Orders   Ambulatory referral to Cardiology   Extrinsic asthma with exacerbation, unspecified asthma severity       Relevant Medications   Ipratropium-Albuterol (COMBIVENT RESPIMAT) 20-100 MCG/ACT AERS respimat   Cough       Relevant Orders   DG Chest 2 View (Completed)      Follow-up: No follow-ups on file.  [name], DO

## 2020-03-20 ENCOUNTER — Other Ambulatory Visit (INDEPENDENT_AMBULATORY_CARE_PROVIDER_SITE_OTHER): Payer: BC Managed Care – PPO

## 2020-03-20 DIAGNOSIS — E785 Hyperlipidemia, unspecified: Secondary | ICD-10-CM

## 2020-03-20 LAB — LIPID PANEL
Cholesterol: 144 mg/dL (ref 0–200)
HDL: 45.4 mg/dL (ref >39.00)
LDL Cholesterol: 78 mg/dL (ref 0–99)
NonHDL: 98.15
Total CHOL/HDL Ratio: 3
Triglycerides: 103 mg/dL (ref 0.0–149.0)
VLDL: 20.6 mg/dL (ref 0.0–40.0)

## 2020-03-20 LAB — COMPREHENSIVE METABOLIC PANEL
ALT: 29 U/L (ref 0–35)
AST: 20 U/L (ref 0–37)
Albumin: 4.3 g/dL (ref 3.5–5.2)
Alkaline Phosphatase: 97 U/L (ref 39–117)
BUN: 18 mg/dL (ref 6–23)
CO2: 25 mEq/L (ref 19–32)
Calcium: 9.9 mg/dL (ref 8.4–10.5)
Chloride: 107 mEq/L (ref 96–112)
Creatinine, Ser: 0.75 mg/dL (ref 0.40–1.20)
GFR: 85.17 mL/min (ref >60.00)
Glucose, Bld: 135 mg/dL — ABNORMAL HIGH (ref 70–99)
Potassium: 4.3 mEq/L (ref 3.5–5.1)
Sodium: 140 mEq/L (ref 135–145)
Total Bilirubin: 0.7 mg/dL (ref 0.2–1.2)
Total Protein: 6.8 g/dL (ref 6.0–8.3)

## 2020-03-21 ENCOUNTER — Encounter: Payer: Self-pay | Admitting: Family Medicine

## 2020-03-21 ENCOUNTER — Telehealth: Payer: Self-pay

## 2020-03-21 DIAGNOSIS — R0789 Other chest pain: Secondary | ICD-10-CM

## 2020-03-21 DIAGNOSIS — I209 Angina pectoris, unspecified: Secondary | ICD-10-CM | POA: Insufficient documentation

## 2020-03-21 DIAGNOSIS — I208 Other forms of angina pectoris: Secondary | ICD-10-CM | POA: Insufficient documentation

## 2020-03-21 DIAGNOSIS — I2089 Other forms of angina pectoris: Secondary | ICD-10-CM

## 2020-03-21 DIAGNOSIS — R002 Palpitations: Secondary | ICD-10-CM | POA: Insufficient documentation

## 2020-03-21 HISTORY — DX: Palpitations: R00.2

## 2020-03-21 HISTORY — DX: Other forms of angina pectoris: I20.8

## 2020-03-21 HISTORY — DX: Other chest pain: R07.89

## 2020-03-21 HISTORY — DX: Angina pectoris, unspecified: I20.9

## 2020-03-21 HISTORY — DX: Other forms of angina pectoris: I20.89

## 2020-03-21 NOTE — Assessment & Plan Note (Signed)
Event monitor

## 2020-03-21 NOTE — Assessment & Plan Note (Signed)
Tolerating statin, encouraged heart healthy diet, avoid trans fats, minimize simple carbs and saturated fats. Increase exercise as tolerated 

## 2020-03-21 NOTE — Assessment & Plan Note (Signed)
Well controlled, no changes to meds. Encouraged heart healthy diet such as the DASH diet and exercise as tolerated.  °

## 2020-03-21 NOTE — Telephone Encounter (Signed)
Left message for patient to return call to office. 

## 2020-03-21 NOTE — Addendum Note (Signed)
Addended by: Kelle Darting A on: 03/21/2020 03:54 PM   Modules accepted: Orders

## 2020-03-21 NOTE — Assessment & Plan Note (Signed)
Check labs  Echo and refer to cardiology

## 2020-03-23 ENCOUNTER — Other Ambulatory Visit (INDEPENDENT_AMBULATORY_CARE_PROVIDER_SITE_OTHER): Payer: BC Managed Care – PPO

## 2020-03-23 ENCOUNTER — Other Ambulatory Visit: Payer: Self-pay

## 2020-03-23 DIAGNOSIS — R0789 Other chest pain: Secondary | ICD-10-CM | POA: Diagnosis not present

## 2020-03-23 LAB — CBC WITH DIFFERENTIAL/PLATELET
Basophils Absolute: 0 10*3/uL (ref 0.0–0.1)
Basophils Relative: 0.5 % (ref 0.0–3.0)
Eosinophils Absolute: 0.3 10*3/uL (ref 0.0–0.7)
Eosinophils Relative: 4 % (ref 0.0–5.0)
HCT: 40.6 % (ref 36.0–46.0)
Hemoglobin: 13.7 g/dL (ref 12.0–15.0)
Lymphocytes Relative: 31.7 % (ref 12.0–46.0)
Lymphs Abs: 2 10*3/uL (ref 0.7–4.0)
MCHC: 33.6 g/dL (ref 30.0–36.0)
MCV: 89.1 fl (ref 78.0–100.0)
Monocytes Absolute: 0.5 10*3/uL (ref 0.1–1.0)
Monocytes Relative: 7.4 % (ref 3.0–12.0)
Neutro Abs: 3.6 10*3/uL (ref 1.4–7.7)
Neutrophils Relative %: 56.4 % (ref 43.0–77.0)
Platelets: 315 10*3/uL (ref 150.0–400.0)
RBC: 4.56 Mil/uL (ref 3.87–5.11)
RDW: 13.2 % (ref 11.5–15.5)
WBC: 6.4 10*3/uL (ref 4.0–10.5)

## 2020-03-23 LAB — TROPONIN I (HIGH SENSITIVITY): High Sens Troponin I: 4 ng/L (ref 2–17)

## 2020-03-23 LAB — D-DIMER, QUANTITATIVE: D-Dimer, Quant: 0.38 mcg/mL FEU (ref <0.50)

## 2020-03-23 NOTE — Addendum Note (Signed)
Addended by: Kelle Darting A on: 03/23/2020 07:32 AM   Modules accepted: Orders

## 2020-03-27 NOTE — Addendum Note (Signed)
Addended by: Sanda Linger on: 03/27/2020 04:00 PM   Modules accepted: Orders

## 2020-04-03 DIAGNOSIS — E119 Type 2 diabetes mellitus without complications: Secondary | ICD-10-CM | POA: Insufficient documentation

## 2020-04-03 DIAGNOSIS — I10 Essential (primary) hypertension: Secondary | ICD-10-CM | POA: Insufficient documentation

## 2020-04-03 DIAGNOSIS — T7840XA Allergy, unspecified, initial encounter: Secondary | ICD-10-CM | POA: Insufficient documentation

## 2020-04-03 DIAGNOSIS — J45909 Unspecified asthma, uncomplicated: Secondary | ICD-10-CM | POA: Insufficient documentation

## 2020-04-03 DIAGNOSIS — K219 Gastro-esophageal reflux disease without esophagitis: Secondary | ICD-10-CM | POA: Insufficient documentation

## 2020-04-05 ENCOUNTER — Ambulatory Visit: Payer: BC Managed Care – PPO | Admitting: Cardiology

## 2020-04-05 ENCOUNTER — Other Ambulatory Visit: Payer: Self-pay | Admitting: Family Medicine

## 2020-04-05 ENCOUNTER — Other Ambulatory Visit: Payer: Self-pay

## 2020-04-05 ENCOUNTER — Encounter: Payer: Self-pay | Admitting: Cardiology

## 2020-04-05 VITALS — BP 122/80 | HR 74 | Ht 61.0 in | Wt 167.1 lb

## 2020-04-05 DIAGNOSIS — I209 Angina pectoris, unspecified: Secondary | ICD-10-CM

## 2020-04-05 DIAGNOSIS — E782 Mixed hyperlipidemia: Secondary | ICD-10-CM

## 2020-04-05 DIAGNOSIS — Z1231 Encounter for screening mammogram for malignant neoplasm of breast: Secondary | ICD-10-CM

## 2020-04-05 DIAGNOSIS — E669 Obesity, unspecified: Secondary | ICD-10-CM | POA: Diagnosis not present

## 2020-04-05 DIAGNOSIS — I10 Essential (primary) hypertension: Secondary | ICD-10-CM

## 2020-04-05 DIAGNOSIS — E1165 Type 2 diabetes mellitus with hyperglycemia: Secondary | ICD-10-CM

## 2020-04-05 MED ORDER — DIPHENHYDRAMINE HCL 50 MG PO CAPS: ORAL_CAPSULE | ORAL | 0 refills | Status: DC

## 2020-04-05 MED ORDER — PREDNISONE 50 MG PO TABS: ORAL_TABLET | ORAL | 0 refills | Status: DC

## 2020-04-05 MED ORDER — NITROGLYCERIN 0.4 MG SL SUBL: 0.4000 mg | SUBLINGUAL_TABLET | SUBLINGUAL | 3 refills | Status: DC | PRN

## 2020-04-05 NOTE — Addendum Note (Signed)
Addended by: Elianny Buxbaum, Jonelle Sidle L on: 04/05/2020 09:28 AM   Modules accepted: Orders

## 2020-04-05 NOTE — Addendum Note (Signed)
Addended by: Resa Miner I on: 04/05/2020 10:00 AM   Modules accepted: Orders

## 2020-04-05 NOTE — Progress Notes (Addendum)
Cardiology Office Note:    Date:  04/05/2020   ID:  Nicole Richard, DOB 1955-04-24, MRN [id number]  PCP:  Carollee Herter, Alferd Apa, DO  Cardiologist:  Jenean Lindau, MD   Referring MD: Carollee Herter, Alferd Apa, *    ASSESSMENT:    1. Angina pectoris ([place])   2. Essential hypertension   3. Mixed hyperlipidemia   4. Obesity (BMI 30-39.9)   5. Uncontrolled type 2 diabetes mellitus with hyperglycemia (HCC)    PLAN:    In order of problems listed above:  1. Angina pectoris: Patient symptoms are concerning and recommendations were done to the patient as follows.  She will continue aspirin 81 mg coated on a daily basis.  Sublingual nitroglycerin prescription was sent, its protocol and 911 protocol explained and the patient vocalized understanding questions were answered to the patient's satisfaction.In view of the patient's symptoms, I discussed with the patient options for evaluation. Invasive and noninvasive options were given to the patient. I discussed stress testing and coronary angiography and left heart catheterization at length. Benefits, pros and cons of each approach were discussed at length. Patient had multiple questions which were answered to the patient's satisfaction. Patient opted for invasive evaluation and we will set up for coronary angiography and left heart catheterization. Further recommendations will be made based on the findings with coronary angiography. In the interim if the patient has any significant symptoms in hospital to the nearest emergency room.  I also discussed with the patient coronary CT angiography with FFR and she is not keen on it. 2. Essential hypertension: Blood pressure stable and diet was emphasized. 3. Mixed dyslipidemia: Diet emphasized.  Lipids were reviewed and they are fine.  She is on statin 4. Diabetes mellitus: Out of control and hemoglobin A1c is elevated.  Diet was emphasized.  Once the coronary etiology is done and she is fine we will begin her  on an exercise program and also she was advised to be a lot of attention to diet.  She promises to do so. 5. Obesity: As advised above. 6. She will be seen in follow-up appointment in 1 month or earlier if she has any concerns.  Patient had multiple questions which were answered to her satisfaction. 7. She gives history suggesting of dye allergy and we will premedicate her.  I discussed with her dye allergy and coronary angiography issues.  She may even have significant allergies such as anaphylaxis and premedication and benefit risk issues were discussed with her at extensive length.  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   Medication Adjustments/Labs and Tests Ordered: Current medicines are reviewed at length with the patient today.  Concerns regarding medicines are outlined above.  No orders of the defined types were placed in this encounter.  No orders of the defined types were placed in this encounter.    History of Present Illness:    Nicole Richard is a 65 y.o. female who is being seen today for the evaluation of chest pain at the request of Carollee Herter, Alferd Apa, *.  Patient is a pleasant 65 year old female.  She has past medical history of essential hypertension, dyslipidemia and diabetes mellitus.  She has family history of coronary artery disease.  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  She does not exercise on a regular basis.  She is not sexually active according to the history she provides to me.  She  mentions to me that her work is very stressful and under stressed since the month of January she has been noticing substernal chest tightness which concerns her.  No radiation to the neck or to the arms.  No orthopnea PND dizziness.  At the time of my evaluation, the patient is alert awake oriented and in no distress.  She is overweight.  Past Medical History:  Diagnosis Date  . Abdominal pain, left upper quadrant  03/01/2013  . Abnormal liver function tests 03/01/2013  . Allergy    seasonal  . Angina pectoris ([place]) 03/21/2020  . Asthma   . BREAST IMPLANTS, BILATERAL, HX OF 07/28/2007   Qualifier: Diagnosis of  By: [name]    . CYSTOCELE WITH INCOMPLETE UTERINE PROLAPSE 05/18/2006   Qualifier: Diagnosis of  By: [name]    . Diabetes ([place]) 01/29/2014  . Diabetes mellitus without complication ([place])   . ESOPHAGEAL STRICTURE 11/09/2009   Qualifier: Diagnosis of  By: [name] (AAMA), [name]    . Essential hypertension 05/18/2006   Qualifier: Diagnosis of  By: [name]    . GERD 09/14/2009   Qualifier: Diagnosis of  By: [name]    . GERD (gastroesophageal reflux disease)   . HEADACHE 05/18/2006   Qualifier: Diagnosis of  By: [name]    . Hirsutism 05/18/2006   Qualifier: Diagnosis of  By: [name]    . Hyperlipidemia   . Hyperlipidemia associated with type 2 diabetes mellitus ([place]) 04/15/2019  . Hypertension   . LAPAROSCOPY, HX OF 05/18/2006   Qualifier: Diagnosis of  By: [name]    . Nonspecific (abnormal) findings on radiological and other examination of biliary tract 03/01/2013  . Obesity (BMI 30-39.9) 10/18/2012  . Other chest pain 03/21/2020  . Palpitation 03/21/2020  . POSTMENOPAUSAL STATUS 09/14/2009   Qualifier: Diagnosis of  By: [name]    . SKIN TAG 10/05/2009   Qualifier: Diagnosis of  By: [name]    . Uncontrolled type 2 diabetes mellitus with hyperglycemia ([place]) 04/15/2019    Past Surgical History:  Procedure Laterality Date  . ABDOMINAL HYSTERECTOMY  08/2004  . APPENDECTOMY    . AUGMENTATION MAMMAPLASTY Bilateral 2000  . BREAST ENHANCEMENT SURGERY  2001  . COLONOSCOPY    . POLYPECTOMY  2009   sigmoid polyps  . SHOULDER ARTHROSCOPY W/ ROTATOR CUFF REPAIR Right 06/06/2015   [facility]  . TONSILLECTOMY    . TUBAL LIGATION      Current Medications: Current Meds  Medication Sig  . aspirin 81 MG tablet Take 81 mg  by mouth at bedtime.   [name] atenolol (TENORMIN) 25 MG tablet TAKE 1 TABLET BY MOUTH EVERY DAY  . atorvastatin (LIPITOR) 40 MG tablet TAKE 1 TABLET BY MOUTH EVERYDAY AT BEDTIME  . bromocriptine (PARLODEL) 2.5 MG tablet Take 0.5 tablets (1.25 mg total) by mouth daily.  . Calcium Citrate-Vitamin D (CALCIUM + D PO) Take 1 tablet by mouth daily.   . Cholecalciferol 125 MCG (5000 UT) capsule Take 5,000 Units by mouth daily.  . fenofibrate 160 MG tablet Take 1 tablet (160 mg total) by mouth daily.  . Flaxseed, Linseed, (FLAXSEED OIL PO) Take 1,400 mg by mouth daily.  . montelukast (SINGULAIR) 10 MG tablet Take 1 tablet (10 mg total) by mouth daily.  [name] Delica Lancets 25K MISC Check Blood sugar twice daily. Dx:E11.9  . ONETOUCH ULTRA test strip ONE TOUCH ULTRA BLUE TEST STRIPS. CHECK  BLOOD SUGAR TWICE DAILY E.119  . pantoprazole (PROTONIX) 40 MG tablet Take 1 tablet (40 mg total) by mouth daily.  . Probiotic Product (PROBIOTIC PO) Take 1 capsule by mouth at bedtime.  . Propylene Glycol 0.6 % SOLN Apply 1 drop to eye as needed.  . repaglinide (PRANDIN) 1 MG tablet Take 1 tablet (1 mg total) by mouth 2 (two) times daily before a meal.  . Semaglutide (RYBELSUS) 14 MG TABS Take 14 mg by mouth daily.     Allergies:   Contrast media [iodinated diagnostic agents], Metamucil [psyllium], and Mustard seed   Social History   Socioeconomic History  . Marital status: Divorced    Spouse name: Not on file  . Number of children: 2  . Years of education: Not on file  . Highest education level: Not on file  Occupational History  . Occupation: [employer]  Tobacco Use  . Smoking status: Never Smoker  . Smokeless tobacco: Never Used  Substance and Sexual Activity  . Alcohol use: Yes    Comment: rare  . Drug use: No  . Sexual activity: Not Currently    Partners: Male  Other Topics Concern  . Not on file  Social History Narrative   Exercise--walking , and pt daily   Social Determinants of  Health   Financial Resource Strain: Not on file  Food Insecurity: Not on file  Transportation Needs: Not on file  Physical Activity: Insufficiently Active  . Days of Exercise per Week: 3 days  . Minutes of Exercise per Session: 20 min  Stress: Not on file  Social Connections: Not on file     Family History: The patient's family history includes Asthma in an other family member; Breast cancer in her maternal grandmother and mother; Cervical cancer in her maternal grandmother; Colon cancer in her maternal grandmother; Colon polyps in her father; Coronary artery disease in an other family member; Diabetes in her father and mother; Hyperlipidemia in an other family member; Hypertension in an other family member; Irritable bowel syndrome in her mother; Kidney disease in her father; Rheumatic fever in her mother; Uterine cancer in her maternal grandmother. There is no history of Esophageal cancer, Rectal cancer, or Stomach cancer.  ROS:   Please see the history of present illness.    All other systems reviewed and are negative.  EKGs/Labs/Other Studies Reviewed:    The following studies were reviewed today: EKG reveals sinus rhythm and nonspecific ST-T changes   Recent Labs: 10/21/2019: TSH 1.03 03/20/2020: ALT 29; BUN 18; Creatinine, Ser 0.75; Potassium 4.3; Sodium 140 03/23/2020: Hemoglobin 13.7; Platelets 315.0  Recent Lipid Panel    Component Value Date/Time   CHOL 144 03/20/2020 1000   TRIG 103.0 03/20/2020 1000   HDL 45.40 03/20/2020 1000   CHOLHDL 3 03/20/2020 1000   VLDL 20.6 03/20/2020 1000   LDLCALC 78 03/20/2020 1000   LDLCALC 113 (H) 10/21/2019 0912   LDLDIRECT 128.1 10/05/2013 0803    Physical Exam:    VS:  BP 122/80   Pulse 74   Ht 5' 1" (1.549 m)   Wt 167 lb 1.9 oz (75.8 kg)   SpO2 97%   BMI 31.58 kg/m     Wt Readings from Last 3 Encounters:  04/05/20 167 lb 1.9 oz (75.8 kg)  03/19/20 167 lb 6.4 oz (75.9 kg)  03/09/20 168 lb 9.6 oz (76.5 kg)     GEN:  Patient is in no acute distress HEENT: Normal NECK: No JVD; No carotid bruits   LYMPHATICS: No lymphadenopathy CARDIAC: S1 S2 regular, 2/6 systolic murmur at the apex. RESPIRATORY:  Clear to auscultation without rales, wheezing or rhonchi  ABDOMEN: Soft, non-tender, non-distended MUSCULOSKELETAL:  No edema; No deformity  SKIN: Warm and dry NEUROLOGIC:  Alert and oriented x 3 PSYCHIATRIC:  Normal affect    Signed, Jenean Lindau, MD  04/05/2020 9:14 AM    [place]

## 2020-04-05 NOTE — Patient Instructions (Addendum)
Medication Instructions:  Your physician has recommended you make the following change in your medication:  START: Nitroglycerin 0.4 mg take one tablet by mouth every 5 minutes as needed up to three times for chest pain.  *If you need a refill on your cardiac medications before your next appointment, please call your pharmacy*  Lab Work: Your physician recommends that you return for lab work in: TODAY CBC, BMP If you have labs (blood work) drawn today and your tests are completely normal, you will receive your results only by: [name] MyChart Message (if you have MyChart) OR . A paper copy in the mail If you have any lab test that is abnormal or we need to change your treatment, we will call you to review the results.   Testing/Procedures:  [facility] [address], [place] [address] Dept: [phone] Loc: [phone]  [name] CHARRETTE  04/05/2020  You are scheduled for a Cardiac Catheterization on Monday, March 21 with Dr. Glenetta Hew.  1. Please arrive at the [facility] (Main Entrance A) at [facility]: [address] [place], [place] [address] at 5:30 AM (This time is two hours before your procedure to ensure your preparation). Free valet parking service is available.   Special note: Every effort is made to have your procedure done on time. Please understand that emergencies sometimes delay scheduled procedures.  2. Diet: Do not eat solid foods after midnight.  The patient may have clear liquids until 5am upon the day of the procedure.  3. Labs: YOU HAD YOUR LABS DRAWN TODAY 04/05/2020  4. Medication instructions in preparation for your procedure:   Contrast Allergy: Yes, Please take Prednisone 50mg  by mouth at: Thirteen hours prior to cath 6:00pm on Sunday Seven hours prior to cath 12:00am on Monday And prior to leaving home please take last dose of Prednisone 50mg  and  Benadryl 50mg  by mouth.  Please hold your repaglinide and semaglutide the morning of your procedure.   On the morning of your procedure, take your Aspirin and any morning medicines NOT listed above.  You may use sips of water.  5. Plan for one night stay--bring personal belongings. 6. Bring a current list of your medications and current insurance cards. 7. You MUST have a responsible person to drive you home. 8. Someone MUST be with you the first 24 hours after you arrive home or your discharge will be delayed. 9. Please wear clothes that are easy to get on and off and wear slip-on shoes.  Thank you for allowing [place] to care for you!   --  Invasive Cardiovascular services    Follow-Up: At [facility], you and your health needs are our priority.  As part of our continuing mission to provide you with exceptional heart care, we have created designated Provider Care Teams.  These Care Teams include your primary Cardiologist (physician) and Advanced Practice Providers (APPs -  Physician Assistants and Nurse Practitioners) who all work together to provide you with the care you need, when you need it.  We recommend signing up for the patient portal called "MyChart".  Sign up information is provided on this After Visit Summary.  MyChart is used to connect with patients for Virtual Visits (Telemedicine).  Patients are able to view lab/test results, encounter notes, upcoming appointments, etc.  Non-urgent messages can be sent to your provider as well.   To learn more about what you  can do with MyChart, go to [email or web address].    Your next appointment:   2 month(s)  The format for your next appointment:   In Person  Provider:   Jyl Heinz, MD   Other Instructions

## 2020-04-05 NOTE — H&P (View-Only) (Signed)
Cardiology Office Note:    Date:  04/05/2020   ID:  VERNEAL WIERS, DOB [birth year], MRN [id number]  PCP:  Carollee Herter, Alferd Apa, DO  Cardiologist:  Jenean Lindau, MD   Referring MD: Carollee Herter, Alferd Apa, *    ASSESSMENT:    1. Angina pectoris ([place])   2. Essential hypertension   3. Mixed hyperlipidemia   4. Obesity (BMI 30-39.9)   5. Uncontrolled type 2 diabetes mellitus with hyperglycemia (HCC)    PLAN:    In order of problems listed above:  1. Angina pectoris: Patient symptoms are concerning and recommendations were done to the patient as follows.  She will continue aspirin 81 mg coated on a daily basis.  Sublingual nitroglycerin prescription was sent, its protocol and 911 protocol explained and the patient vocalized understanding questions were answered to the patient's satisfaction.In view of the patient's symptoms, I discussed with the patient options for evaluation. Invasive and noninvasive options were given to the patient. I discussed stress testing and coronary angiography and left heart catheterization at length. Benefits, pros and cons of each approach were discussed at length. Patient had multiple questions which were answered to the patient's satisfaction. Patient opted for invasive evaluation and we will set up for coronary angiography and left heart catheterization. Further recommendations will be made based on the findings with coronary angiography. In the interim if the patient has any significant symptoms in hospital to the nearest emergency room.  I also discussed with the patient coronary CT angiography with FFR and she is not keen on it. 2. Essential hypertension: Blood pressure stable and diet was emphasized. 3. Mixed dyslipidemia: Diet emphasized.  Lipids were reviewed and they are fine.  She is on statin 4. Diabetes mellitus: Out of control and hemoglobin A1c is elevated.  Diet was emphasized.  Once the coronary etiology is done and she is fine we will begin her  on an exercise program and also she was advised to be a lot of attention to diet.  She promises to do so. 5. Obesity: As advised above. 6. She will be seen in follow-up appointment in 1 month or earlier if she has any concerns.  Patient had multiple questions which were answered to her satisfaction. 7. She gives history suggesting of dye allergy and we will premedicate her.  I discussed with her dye allergy and coronary angiography issues.  She may even have significant allergies such as anaphylaxis and premedication and benefit risk issues were discussed with her at extensive length.  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   Medication Adjustments/Labs and Tests Ordered: Current medicines are reviewed at length with the patient today.  Concerns regarding medicines are outlined above.  No orders of the defined types were placed in this encounter.  No orders of the defined types were placed in this encounter.    History of Present Illness:    KEEANNA VILLAFRANCA is a 65 y.o. female who is being seen today for the evaluation of chest pain at the request of Carollee Herter, Alferd Apa, *.  Patient is a pleasant 64 year old female.  She has past medical history of essential hypertension, dyslipidemia and diabetes mellitus.  She has family history of coronary artery disease.  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  She does not exercise on a regular basis.  She is not sexually active according to the history she provides to me.  She  mentions to me that her work is very stressful and under stressed since the month of January she has been noticing substernal chest tightness which concerns her.  No radiation to the neck or to the arms.  No orthopnea PND dizziness.  At the time of my evaluation, the patient is alert awake oriented and in no distress.  She is overweight.  Past Medical History:  Diagnosis Date  . Abdominal pain, left upper quadrant  03/01/2013  . Abnormal liver function tests 03/01/2013  . Allergy    seasonal  . Angina pectoris ([place]) 03/21/2020  . Asthma   . BREAST IMPLANTS, BILATERAL, HX OF 07/28/2007   Qualifier: Diagnosis of  By: [name]    . CYSTOCELE WITH INCOMPLETE UTERINE PROLAPSE 05/18/2006   Qualifier: Diagnosis of  By: [name]    . Diabetes ([place]) 01/29/2014  . Diabetes mellitus without complication ([place])   . ESOPHAGEAL STRICTURE 11/09/2009   Qualifier: Diagnosis of  By: [name] (AAMA), [name]    . Essential hypertension 05/18/2006   Qualifier: Diagnosis of  By: [name]    . GERD 09/14/2009   Qualifier: Diagnosis of  By: [name]    . GERD (gastroesophageal reflux disease)   . HEADACHE 05/18/2006   Qualifier: Diagnosis of  By: [name]    . Hirsutism 05/18/2006   Qualifier: Diagnosis of  By: [name]    . Hyperlipidemia   . Hyperlipidemia associated with type 2 diabetes mellitus ([place]) 04/15/2019  . Hypertension   . LAPAROSCOPY, HX OF 05/18/2006   Qualifier: Diagnosis of  By: [name]    . Nonspecific (abnormal) findings on radiological and other examination of biliary tract 03/01/2013  . Obesity (BMI 30-39.9) 10/18/2012  . Other chest pain 03/21/2020  . Palpitation 03/21/2020  . POSTMENOPAUSAL STATUS 09/14/2009   Qualifier: Diagnosis of  By: [name]    . SKIN TAG 10/05/2009   Qualifier: Diagnosis of  By: [name]    . Uncontrolled type 2 diabetes mellitus with hyperglycemia ([place]) 04/15/2019    Past Surgical History:  Procedure Laterality Date  . ABDOMINAL HYSTERECTOMY  08/2004  . APPENDECTOMY    . AUGMENTATION MAMMAPLASTY Bilateral 2000  . BREAST ENHANCEMENT SURGERY  2001  . COLONOSCOPY    . POLYPECTOMY  2009   sigmoid polyps  . SHOULDER ARTHROSCOPY W/ ROTATOR CUFF REPAIR Right 06/06/2015   [facility]  . TONSILLECTOMY    . TUBAL LIGATION      Current Medications: Current Meds  Medication Sig  . aspirin 81 MG tablet Take 81 mg  by mouth at bedtime.   [name] atenolol (TENORMIN) 25 MG tablet TAKE 1 TABLET BY MOUTH EVERY DAY  . atorvastatin (LIPITOR) 40 MG tablet TAKE 1 TABLET BY MOUTH EVERYDAY AT BEDTIME  . bromocriptine (PARLODEL) 2.5 MG tablet Take 0.5 tablets (1.25 mg total) by mouth daily.  . Calcium Citrate-Vitamin D (CALCIUM + D PO) Take 1 tablet by mouth daily.   . Cholecalciferol 125 MCG (5000 UT) capsule Take 5,000 Units by mouth daily.  . fenofibrate 160 MG tablet Take 1 tablet (160 mg total) by mouth daily.  . Flaxseed, Linseed, (FLAXSEED OIL PO) Take 1,400 mg by mouth daily.  . montelukast (SINGULAIR) 10 MG tablet Take 1 tablet (10 mg total) by mouth daily.  [name] Delica Lancets 25D MISC Check Blood sugar twice daily. Dx:E11.9  . ONETOUCH ULTRA test strip ONE TOUCH ULTRA BLUE TEST STRIPS. CHECK  BLOOD SUGAR TWICE DAILY E.119  . pantoprazole (PROTONIX) 40 MG tablet Take 1 tablet (40 mg total) by mouth daily.  . Probiotic Product (PROBIOTIC PO) Take 1 capsule by mouth at bedtime.  [name] Propylene Glycol 0.6 % SOLN Apply 1 drop to eye as needed.  . repaglinide (PRANDIN) 1 MG tablet Take 1 tablet (1 mg total) by mouth 2 (two) times daily before a meal.  . Semaglutide (RYBELSUS) 14 MG TABS Take 14 mg by mouth daily.     Allergies:   Contrast media [iodinated diagnostic agents], Metamucil [psyllium], and Mustard seed   Social History   Socioeconomic History  . Marital status: Divorced    Spouse name: Not on file  . Number of children: 2  . Years of education: Not on file  . Highest education level: Not on file  Occupational History  . Occupation: [employer]  Tobacco Use  . Smoking status: Never Smoker  . Smokeless tobacco: Never Used  Substance and Sexual Activity  . Alcohol use: Yes    Comment: rare  . Drug use: No  . Sexual activity: Not Currently    Partners: Male  Other Topics Concern  . Not on file  Social History Narrative   Buyer, retail , and pt daily   Social Determinants of  Health   Financial Resource Strain: Not on file  Food Insecurity: Not on file  Transportation Needs: Not on file  Physical Activity: Insufficiently Active  . Days of Exercise per Week: 3 days  . Minutes of Exercise per Session: 20 min  Stress: Not on file  Social Connections: Not on file     Family History: The patient's family history includes Asthma in an other family member; Breast cancer in her maternal grandmother and mother; Cervical cancer in her maternal grandmother; Colon cancer in her maternal grandmother; Colon polyps in her father; Coronary artery disease in an other family member; Diabetes in her father and mother; Hyperlipidemia in an other family member; Hypertension in an other family member; Irritable bowel syndrome in her mother; Kidney disease in her father; Rheumatic fever in her mother; Uterine cancer in her maternal grandmother. There is no history of Esophageal cancer, Rectal cancer, or Stomach cancer.  ROS:   Please see the history of present illness.    All other systems reviewed and are negative.  EKGs/Labs/Other Studies Reviewed:    The following studies were reviewed today: EKG reveals sinus rhythm and nonspecific ST-T changes   Recent Labs: 10/21/2019: TSH 1.03 03/20/2020: ALT 29; BUN 18; Creatinine, Ser 0.75; Potassium 4.3; Sodium 140 03/23/2020: Hemoglobin 13.7; Platelets 315.0  Recent Lipid Panel    Component Value Date/Time   CHOL 144 03/20/2020 1000   TRIG 103.0 03/20/2020 1000   HDL 45.40 03/20/2020 1000   CHOLHDL 3 03/20/2020 1000   VLDL 20.6 03/20/2020 1000   LDLCALC 78 03/20/2020 1000   LDLCALC 113 (H) 10/21/2019 0912   LDLDIRECT 128.1 10/05/2013 0803    Physical Exam:    VS:  BP 122/80   Pulse 74   Ht 5\' 1"  (1.549 m)   Wt 167 lb 1.9 oz (75.8 kg)   SpO2 97%   BMI 31.58 kg/m     Wt Readings from Last 3 Encounters:  04/05/20 167 lb 1.9 oz (75.8 kg)  03/19/20 167 lb 6.4 oz (75.9 kg)  03/09/20 168 lb 9.6 oz (76.5 kg)     GEN:  Patient is in no acute distress HEENT: Normal NECK: No JVD; No carotid bruits  LYMPHATICS: No lymphadenopathy CARDIAC: S1 S2 regular, 2/6 systolic murmur at the apex. RESPIRATORY:  Clear to auscultation without rales, wheezing or rhonchi  ABDOMEN: Soft, non-tender, non-distended MUSCULOSKELETAL:  No edema; No deformity  SKIN: Warm and dry NEUROLOGIC:  Alert and oriented x 3 PSYCHIATRIC:  Normal affect    Signed, Jenean Lindau, MD  04/05/2020 9:14 AM    [place]

## 2020-04-05 NOTE — Addendum Note (Signed)
Addended by: Saige Busby, Jonelle Sidle L on: 04/05/2020 09:50 AM   Modules accepted: Orders

## 2020-04-06 LAB — CBC
Hematocrit: 44 % (ref 34.0–46.6)
Hemoglobin: 14.5 g/dL (ref 11.1–15.9)
MCH: 29.7 pg (ref 26.6–33.0)
MCHC: 33 g/dL (ref 31.5–35.7)
MCV: 90 fL (ref 79–97)
Platelets: 334 10*3/uL (ref 150–450)
RBC: 4.89 x10E6/uL (ref 3.77–5.28)
RDW: 12.3 % (ref 11.7–15.4)
WBC: 5.9 10*3/uL (ref 3.4–10.8)

## 2020-04-06 LAB — BASIC METABOLIC PANEL
BUN/Creatinine Ratio: 13 (ref 12–28)
BUN: 11 mg/dL (ref 8–27)
CO2: 23 mmol/L (ref 20–29)
Calcium: 10.3 mg/dL (ref 8.7–10.3)
Chloride: 104 mmol/L (ref 96–106)
Creatinine, Ser: 0.87 mg/dL (ref 0.57–1.00)
Glucose: 143 mg/dL — ABNORMAL HIGH (ref 65–99)
Potassium: 5.5 mmol/L — ABNORMAL HIGH (ref 3.5–5.2)
Sodium: 141 mmol/L (ref 134–144)
eGFR: 75 mL/min/{1.73_m2} (ref >59)

## 2020-04-07 ENCOUNTER — Other Ambulatory Visit (HOSPITAL_COMMUNITY)
Admission: RE | Admit: 2020-04-07 | Discharge: 2020-04-07 | Disposition: A | Discharge status: Home / Self Care | Payer: BC Managed Care – PPO | Source: Ambulatory Visit | Attending: Cardiology | Admitting: Cardiology

## 2020-04-07 DIAGNOSIS — Z79899 Other long term (current) drug therapy: Secondary | ICD-10-CM | POA: Diagnosis not present

## 2020-04-07 DIAGNOSIS — E782 Mixed hyperlipidemia: Secondary | ICD-10-CM | POA: Diagnosis not present

## 2020-04-07 DIAGNOSIS — Z01812 Encounter for preprocedural laboratory examination: Secondary | ICD-10-CM | POA: Insufficient documentation

## 2020-04-07 DIAGNOSIS — Z7982 Long term (current) use of aspirin: Secondary | ICD-10-CM | POA: Diagnosis not present

## 2020-04-07 DIAGNOSIS — Z20822 Contact with and (suspected) exposure to covid-19: Secondary | ICD-10-CM | POA: Insufficient documentation

## 2020-04-07 DIAGNOSIS — I10 Essential (primary) hypertension: Secondary | ICD-10-CM | POA: Diagnosis not present

## 2020-04-07 DIAGNOSIS — Z6831 Body mass index (BMI) 31.0-31.9, adult: Secondary | ICD-10-CM | POA: Diagnosis not present

## 2020-04-07 DIAGNOSIS — I25119 Atherosclerotic heart disease of native coronary artery with unspecified angina pectoris: Secondary | ICD-10-CM | POA: Diagnosis not present

## 2020-04-07 DIAGNOSIS — E669 Obesity, unspecified: Secondary | ICD-10-CM | POA: Diagnosis not present

## 2020-04-07 DIAGNOSIS — Z91041 Radiographic dye allergy status: Secondary | ICD-10-CM | POA: Diagnosis not present

## 2020-04-07 DIAGNOSIS — E1165 Type 2 diabetes mellitus with hyperglycemia: Secondary | ICD-10-CM | POA: Diagnosis not present

## 2020-04-07 DIAGNOSIS — Z888 Allergy status to other drugs, medicaments and biological substances status: Secondary | ICD-10-CM | POA: Diagnosis not present

## 2020-04-07 DIAGNOSIS — Z8249 Family history of ischemic heart disease and other diseases of the circulatory system: Secondary | ICD-10-CM | POA: Diagnosis not present

## 2020-04-08 LAB — SARS CORONAVIRUS 2 (TAT 6-24 HRS): SARS Coronavirus 2: NEGATIVE

## 2020-04-09 ENCOUNTER — Other Ambulatory Visit: Payer: Self-pay

## 2020-04-09 ENCOUNTER — Ambulatory Visit (HOSPITAL_COMMUNITY)
Admission: RE | Admit: 2020-04-09 | Discharge: 2020-04-09 | Disposition: A | Discharge status: Home / Self Care | Payer: BC Managed Care – PPO | Attending: Cardiology | Admitting: Cardiology

## 2020-04-09 ENCOUNTER — Encounter (HOSPITAL_COMMUNITY): Payer: Self-pay | Admitting: Cardiology

## 2020-04-09 ENCOUNTER — Encounter (HOSPITAL_COMMUNITY)
Admission: RE | Disposition: A | Discharge status: Home / Self Care | Payer: Self-pay | Source: Home / Self Care | Attending: Cardiology

## 2020-04-09 DIAGNOSIS — I208 Other forms of angina pectoris: Secondary | ICD-10-CM | POA: Diagnosis not present

## 2020-04-09 DIAGNOSIS — I2089 Other forms of angina pectoris: Secondary | ICD-10-CM

## 2020-04-09 DIAGNOSIS — Z20822 Contact with and (suspected) exposure to covid-19: Secondary | ICD-10-CM | POA: Diagnosis not present

## 2020-04-09 DIAGNOSIS — Z79899 Other long term (current) drug therapy: Secondary | ICD-10-CM | POA: Insufficient documentation

## 2020-04-09 DIAGNOSIS — I25119 Atherosclerotic heart disease of native coronary artery with unspecified angina pectoris: Secondary | ICD-10-CM | POA: Insufficient documentation

## 2020-04-09 DIAGNOSIS — E669 Obesity, unspecified: Secondary | ICD-10-CM | POA: Diagnosis not present

## 2020-04-09 DIAGNOSIS — I10 Essential (primary) hypertension: Secondary | ICD-10-CM | POA: Diagnosis not present

## 2020-04-09 DIAGNOSIS — E782 Mixed hyperlipidemia: Secondary | ICD-10-CM | POA: Diagnosis not present

## 2020-04-09 DIAGNOSIS — Z6831 Body mass index (BMI) 31.0-31.9, adult: Secondary | ICD-10-CM | POA: Diagnosis not present

## 2020-04-09 DIAGNOSIS — Z91041 Radiographic dye allergy status: Secondary | ICD-10-CM | POA: Insufficient documentation

## 2020-04-09 DIAGNOSIS — E1165 Type 2 diabetes mellitus with hyperglycemia: Secondary | ICD-10-CM | POA: Diagnosis not present

## 2020-04-09 DIAGNOSIS — Z7982 Long term (current) use of aspirin: Secondary | ICD-10-CM | POA: Diagnosis not present

## 2020-04-09 DIAGNOSIS — I209 Angina pectoris, unspecified: Secondary | ICD-10-CM

## 2020-04-09 DIAGNOSIS — Z8249 Family history of ischemic heart disease and other diseases of the circulatory system: Secondary | ICD-10-CM | POA: Diagnosis not present

## 2020-04-09 DIAGNOSIS — Z888 Allergy status to other drugs, medicaments and biological substances status: Secondary | ICD-10-CM | POA: Diagnosis not present

## 2020-04-09 DIAGNOSIS — E1169 Type 2 diabetes mellitus with other specified complication: Secondary | ICD-10-CM | POA: Diagnosis present

## 2020-04-09 HISTORY — PX: LEFT HEART CATH AND CORONARY ANGIOGRAPHY: CATH118249

## 2020-04-09 LAB — GLUCOSE, CAPILLARY: Glucose-Capillary: 280 mg/dL — ABNORMAL HIGH (ref 70–99)

## 2020-04-09 SURGERY — LEFT HEART CATH AND CORONARY ANGIOGRAPHY
Anesthesia: LOCAL

## 2020-04-09 MED ORDER — SODIUM CHLORIDE 0.9 % IV SOLN: 250.0000 mL | INTRAVENOUS | Status: DC | PRN

## 2020-04-09 MED ORDER — SODIUM CHLORIDE 0.9 % IV SOLN: INTRAVENOUS | Status: DC

## 2020-04-09 MED ORDER — HEPARIN (PORCINE) IN NACL 1000-0.9 UT/500ML-% IV SOLN
INTRAVENOUS | Status: DC | PRN
  Administered 2020-04-09 (×2): 500 mL

## 2020-04-09 MED ORDER — ONDANSETRON HCL 4 MG/2ML IJ SOLN: 4.0000 mg | Freq: Four times a day (QID) | INTRAMUSCULAR | Status: DC | PRN

## 2020-04-09 MED ORDER — FENTANYL CITRATE (PF) 100 MCG/2ML IJ SOLN
INTRAMUSCULAR | Status: DC | PRN
  Administered 2020-04-09: 25 ug via INTRAVENOUS

## 2020-04-09 MED ORDER — SODIUM CHLORIDE 0.9 % WEIGHT BASED INFUSION
3.0000 mL/kg/h | INTRAVENOUS | Status: AC
  Administered 2020-04-09: 3 mL/kg/h via INTRAVENOUS

## 2020-04-09 MED ORDER — ASPIRIN 81 MG PO CHEW: 81.0000 mg | CHEWABLE_TABLET | ORAL | Status: DC

## 2020-04-09 MED ORDER — SODIUM CHLORIDE 0.9% FLUSH: 3.0000 mL | Freq: Two times a day (BID) | INTRAVENOUS | Status: DC

## 2020-04-09 MED ORDER — MIDAZOLAM HCL 2 MG/2ML IJ SOLN
INTRAMUSCULAR | Status: DC | PRN
  Administered 2020-04-09: 2 mg via INTRAVENOUS

## 2020-04-09 MED ORDER — FENTANYL CITRATE (PF) 100 MCG/2ML IJ SOLN
INTRAMUSCULAR | Status: AC
  Filled 2020-04-09: qty 2

## 2020-04-09 MED ORDER — ACETAMINOPHEN 325 MG PO TABS: 650.0000 mg | ORAL_TABLET | ORAL | Status: DC | PRN

## 2020-04-09 MED ORDER — HEPARIN SODIUM (PORCINE) 1000 UNIT/ML IJ SOLN
INTRAMUSCULAR | Status: DC | PRN
  Administered 2020-04-09: 4000 [IU] via INTRAVENOUS

## 2020-04-09 MED ORDER — SODIUM CHLORIDE 0.9% FLUSH: 3.0000 mL | INTRAVENOUS | Status: DC | PRN

## 2020-04-09 MED ORDER — HYDRALAZINE HCL 20 MG/ML IJ SOLN: 10.0000 mg | INTRAMUSCULAR | Status: DC | PRN

## 2020-04-09 MED ORDER — LIDOCAINE HCL (PF) 1 % IJ SOLN
INTRAMUSCULAR | Status: AC
  Filled 2020-04-09: qty 30

## 2020-04-09 MED ORDER — IOHEXOL 350 MG/ML SOLN
INTRAVENOUS | Status: DC | PRN
  Administered 2020-04-09: 50 mL

## 2020-04-09 MED ORDER — VERAPAMIL HCL 2.5 MG/ML IV SOLN
INTRAVENOUS | Status: AC
  Filled 2020-04-09: qty 2

## 2020-04-09 MED ORDER — LIDOCAINE HCL (PF) 1 % IJ SOLN
INTRAMUSCULAR | Status: DC | PRN
  Administered 2020-04-09: 2 mL

## 2020-04-09 MED ORDER — LABETALOL HCL 5 MG/ML IV SOLN: 10.0000 mg | INTRAVENOUS | Status: DC | PRN

## 2020-04-09 MED ORDER — MIDAZOLAM HCL 2 MG/2ML IJ SOLN
INTRAMUSCULAR | Status: AC
  Filled 2020-04-09: qty 2

## 2020-04-09 MED ORDER — VERAPAMIL HCL 2.5 MG/ML IV SOLN
INTRAVENOUS | Status: DC | PRN
  Administered 2020-04-09: 10 mL via INTRA_ARTERIAL

## 2020-04-09 MED ORDER — SODIUM CHLORIDE 0.9 % WEIGHT BASED INFUSION: 1.0000 mL/kg/h | INTRAVENOUS | Status: DC

## 2020-04-09 MED ORDER — HEPARIN SODIUM (PORCINE) 1000 UNIT/ML IJ SOLN
INTRAMUSCULAR | Status: AC
  Filled 2020-04-09: qty 1

## 2020-04-09 MED ORDER — HEPARIN (PORCINE) IN NACL 1000-0.9 UT/500ML-% IV SOLN
INTRAVENOUS | Status: AC
  Filled 2020-04-09: qty 1000

## 2020-04-09 SURGICAL SUPPLY — 12 items
CATH INFINITI 5FR ANG PIGTAIL (CATHETERS) IMPLANT
CATH INFINITI JR4 5F (CATHETERS) ×1 IMPLANT
CATH OPTITORQUE TIG 4.0 5F (CATHETERS) ×1 IMPLANT
DEVICE RAD COMP TR BAND LRG (VASCULAR PRODUCTS) ×1 IMPLANT
GLIDESHEATH SLEND SS 6F .021 (SHEATH) ×1 IMPLANT
GUIDEWIRE INQWIRE 1.5J.035X260 (WIRE) IMPLANT
INQWIRE 1.5J .035X260CM (WIRE) ×2
KIT HEART LEFT (KITS) ×2 IMPLANT
PACK CARDIAC CATHETERIZATION (CUSTOM PROCEDURE TRAY) ×2 IMPLANT
SHEATH PROBE COVER 6X72 (BAG) ×1 IMPLANT
TRANSDUCER W/STOPCOCK (MISCELLANEOUS) ×2 IMPLANT
TUBING CIL FLEX 10 FLL-RA (TUBING) ×2 IMPLANT

## 2020-04-09 NOTE — Interval H&P Note (Signed)
History and Physical Interval Note:  04/09/2020 7:37 AM  Nicole Richard  has presented today for surgery, with the diagnosis of angina, chest pain.  The various methods of treatment have been discussed with the patient and family. After consideration of risks, benefits and other options for treatment, the patient has consented to  Procedure(s): LEFT HEART CATH AND CORONARY ANGIOGRAPHY (N/A)  PERCUTANEOUS CORONARY INTERVENTION   as a surgical intervention.  The patient's history has been reviewed, patient examined, no change in status, stable for surgery.  I have reviewed the patient's chart and labs.  Questions were answered to the patient's satisfaction.    Cath Lab Visit (complete for each Cath Lab visit)  Clinical Evaluation Leading to the Procedure:   ACS: No.  Non-ACS:    Anginal Classification: CCS III  Anti-ischemic medical therapy: Minimal Therapy (1 class of medications)  Non-Invasive Test Results: No non-invasive testing performed  Prior CABG: No previous CABG  Glenetta Hew

## 2020-04-09 NOTE — Discharge Instructions (Signed)
Radial Site Care  This sheet gives you information about how to care for yourself after your procedure. Your health care provider may also give you more specific instructions. If you have problems or questions, contact your health care provider. What can I expect after the procedure? After the procedure, it is common to have:  Bruising and tenderness at the catheter insertion area. Follow these instructions at home: Medicines  Take over-the-counter and prescription medicines only as told by your health care provider. Insertion site care  Follow instructions from your health care provider about how to take care of your insertion site. Make sure you: ? Wash your hands with soap and water before you change your bandage (dressing). If soap and water are not available, use hand sanitizer. ? Change your dressing as told by your health care provider. ? Leave stitches (sutures), skin glue, or adhesive strips in place. These skin closures may need to stay in place for 2 weeks or longer. If adhesive strip edges start to loosen and curl up, you may trim the loose edges. Do not remove adhesive strips completely unless your health care provider tells you to do that.  Check your insertion site every day for signs of infection. Check for: ? Redness, swelling, or pain. ? Fluid or blood. ? Pus or a bad smell. ? Warmth.  Do not take baths, swim, or use a hot tub until your health care provider approves.  You may shower 24-48 hours after the procedure, or as directed by your health care provider. ? Remove the dressing and gently wash the site with plain soap and water. ? Pat the area dry with a clean towel. ? Do not rub the site. That could cause bleeding.  Do not apply powder or lotion to the site. Activity  For 24 hours after the procedure, or as directed by your health care provider: ? Do not flex or bend the affected arm. ? Do not push or pull heavy objects with the affected arm. ? Do not drive  yourself home from the hospital or clinic. You may drive 24 hours after the procedure unless your health care provider tells you not to. ? Do not operate machinery or power tools.  Do not lift anything that is heavier than 10 lb (4.5 kg), or the limit that you are told, until your health care provider says that it is safe.  Ask your health care provider when it is okay to: ? Return to work or school. ? Resume usual physical activities or sports. ? Resume sexual activity.   General instructions  If the catheter site starts to bleed, raise your arm and put firm pressure on the site. If the bleeding does not stop, get help right away. This is a medical emergency.  If you went home on the same day as your procedure, a responsible adult should be with you for the first 24 hours after you arrive home.  Keep all follow-up visits as told by your health care provider. This is important. Contact a health care provider if:  You have a fever.  You have redness, swelling, or yellow drainage around your insertion site. Get help right away if:  You have unusual pain at the radial site.  The catheter insertion area swells very fast.  The insertion area is bleeding, and the bleeding does not stop when you hold steady pressure on the area.  Your arm or hand becomes pale, cool, tingly, or numb. These symptoms may represent a serious   problem that is an emergency. Do not wait to see if the symptoms will go away. Get medical help right away. Call your local emergency services (911 in the [place] Do not drive yourself to the hospital. Summary  After the procedure, it is common to have bruising and tenderness at the site.  Follow instructions from your health care provider about how to take care of your radial site wound. Check the wound every day for signs of infection.  Do not lift anything that is heavier than 10 lb (4.5 kg), or the limit that you are told, until your health care provider says that it  is safe. This information is not intended to replace advice given to you by your health care provider. Make sure you discuss any questions you have with your health care provider. Document Revised: 02/11/2017 Document Reviewed: 02/11/2017 Elsevier Patient Education  2021 [employer].  

## 2020-04-09 NOTE — Progress Notes (Signed)
Discharge instructions reviewed with patient and family. Verbalized understanding. 

## 2020-04-20 ENCOUNTER — Ambulatory Visit: Payer: BC Managed Care – PPO | Admitting: Family Medicine

## 2020-04-20 ENCOUNTER — Other Ambulatory Visit: Payer: Self-pay

## 2020-04-20 ENCOUNTER — Encounter: Payer: Self-pay | Admitting: Family Medicine

## 2020-04-20 VITALS — BP 110/62 | HR 70 | Resp 18 | Ht 61.0 in | Wt 167.2 lb

## 2020-04-20 DIAGNOSIS — K222 Esophageal obstruction: Secondary | ICD-10-CM

## 2020-04-20 DIAGNOSIS — Z23 Encounter for immunization: Secondary | ICD-10-CM | POA: Diagnosis not present

## 2020-04-20 DIAGNOSIS — E785 Hyperlipidemia, unspecified: Secondary | ICD-10-CM

## 2020-04-20 DIAGNOSIS — K219 Gastro-esophageal reflux disease without esophagitis: Secondary | ICD-10-CM

## 2020-04-20 DIAGNOSIS — I10 Essential (primary) hypertension: Secondary | ICD-10-CM | POA: Diagnosis not present

## 2020-04-20 DIAGNOSIS — E1169 Type 2 diabetes mellitus with other specified complication: Secondary | ICD-10-CM | POA: Diagnosis not present

## 2020-04-20 DIAGNOSIS — E1165 Type 2 diabetes mellitus with hyperglycemia: Secondary | ICD-10-CM | POA: Diagnosis not present

## 2020-04-20 NOTE — Progress Notes (Signed)
Patient ID: [name], female    DOB: 1955/12/06  Age: 65 y.o. MRN: [id number]    Subjective:  Subjective  HPI Nicole Richard presents for an office visit today. She complains of intermittent chest tightness and pain. She states that she has 2 minutes chest tightness episode a couple of time each week. She describes the tightness as a band around her chest. She wonders if  may be result of Rybelsus 14 mg PO daily for her Dx of Diabetes. She endorses that her inhaler doesn't relieve the symptoms. She also attributes the chest pain and tightness to being stressed at work. She states that her last at home glucose level is 190.   She is seeing endocrine and has had full cardiac workup-- recently had a cath.   Lab Results  Component Value Date   HGBA1C 7.3 (A) 03/09/2020  She also complains of cough. However, she reports that the symptoms has gotten better over time. She denies any SOB, fever, abdominal pain, chills, sore throat, dysuria, urinary incontinence, back pain, HA, or N/VD at this time.   She also wonders if maybe the chest tightness is her reflux -- she has a hx esophageal stricture   Review of Systems  Constitutional: Negative for chills, fatigue and fever.  HENT: Negative for congestion, ear pain, sinus pain and sore throat.   Eyes: Negative for pain.  Respiratory: Positive for cough and chest tightness. Negative for shortness of breath.   Cardiovascular: Positive for chest pain (secondary to her medication. ). Negative for palpitations and leg swelling.       (+) chest tightness secondary to her medication.   Gastrointestinal: Negative for abdominal pain, blood in stool, constipation, diarrhea, nausea and vomiting.  Genitourinary: Negative for dysuria, frequency, hematuria and urgency.  Musculoskeletal: Negative for neck pain.  Neurological: Negative for headaches.    History Past Medical History:  Diagnosis Date  . Abdominal pain, left upper quadrant 03/01/2013  . Abnormal  liver function tests 03/01/2013  . Allergy    seasonal  . Angina pectoris ([place]) 03/21/2020  . Asthma   . BREAST IMPLANTS, BILATERAL, HX OF 07/28/2007   Qualifier: Diagnosis of  By: [name]    . CYSTOCELE WITH INCOMPLETE UTERINE PROLAPSE 05/18/2006   Qualifier: Diagnosis of  By: [name]    . Diabetes ([place]) 01/29/2014  . Diabetes mellitus without complication ([place])   . ESOPHAGEAL STRICTURE 11/09/2009   Qualifier: Diagnosis of  By: [name] (AAMA), [name]    . Essential hypertension 05/18/2006   Qualifier: Diagnosis of  By: [name]    . GERD 09/14/2009   Qualifier: Diagnosis of  By: [name]    . GERD (gastroesophageal reflux disease)   . HEADACHE 05/18/2006   Qualifier: Diagnosis of  By: [name]    . Hirsutism 05/18/2006   Qualifier: Diagnosis of  By: [name]    . Hyperlipidemia   . Hyperlipidemia associated with type 2 diabetes mellitus ([place]) 04/15/2019  . Hypertension   . LAPAROSCOPY, HX OF 05/18/2006   Qualifier: Diagnosis of  By: [name]    . Nonspecific (abnormal) findings on radiological and other examination of biliary tract 03/01/2013  . Obesity (BMI 30-39.9) 10/18/2012  . Other chest pain 03/21/2020  . Palpitation 03/21/2020  . POSTMENOPAUSAL STATUS 09/14/2009   Qualifier: Diagnosis of  By: [name]    . SKIN TAG 10/05/2009   Qualifier: Diagnosis of  By: Etter Sjogren  DO, Kendrick Fries    . Uncontrolled type 2 diabetes mellitus with hyperglycemia ([place]) 04/15/2019    She has a past surgical history that includes Tubal ligation; Appendectomy; Abdominal hysterectomy (08/2004); Tonsillectomy; Breast enhancement surgery (2001); Colonoscopy; Polypectomy (2009); Shoulder arthroscopy w/ rotator cuff repair (Right, 06/06/2015); Augmentation mammaplasty (Bilateral, 2000); and LEFT HEART CATH AND CORONARY ANGIOGRAPHY (N/A, 04/09/2020).   Her family history includes Asthma in an other family member; Breast cancer in her maternal grandmother and  mother; Cervical cancer in her maternal grandmother; Colon cancer in her maternal grandmother; Colon polyps in her father; Coronary artery disease in an other family member; Diabetes in her father and mother; Hyperlipidemia in an other family member; Hypertension in an other family member; Irritable bowel syndrome in her mother; Kidney disease in her father; Rheumatic fever in her mother; Uterine cancer in her maternal grandmother.She reports that she has never smoked. She has never used smokeless tobacco. She reports current alcohol use. She reports that she does not use drugs.  Current Outpatient Medications on File Prior to Visit  Medication Sig Dispense Refill  . aspirin 81 MG chewable tablet Chew 81 mg by mouth at bedtime.    [name] atenolol (TENORMIN) 25 MG tablet TAKE 1 TABLET BY MOUTH EVERY DAY (Patient taking differently: Take 25 mg by mouth at bedtime.) 90 tablet 3  . atorvastatin (LIPITOR) 40 MG tablet TAKE 1 TABLET BY MOUTH EVERYDAY AT BEDTIME (Patient taking differently: Take 40 mg by mouth at bedtime. TAKE 1 TABLET BY MOUTH EVERYDAY AT BEDTIME) 90 tablet 1  . bromocriptine (PARLODEL) 2.5 MG tablet Take 0.5 tablets (1.25 mg total) by mouth daily. (Patient taking differently: Take 1.25 mg by mouth in the morning.) 45 tablet 3  . Calcium Carbonate-Vitamin D (OSCAL 500/200 D-3 PO) Take 1 tablet by mouth at bedtime.    . Cholecalciferol 125 MCG (5000 UT) capsule Take 5,000 Units by mouth at bedtime.    [name] CRANBERRY-VITAMIN C PO Take 2 capsules by mouth at bedtime.    . fenofibrate 160 MG tablet Take 1 tablet (160 mg total) by mouth daily. (Patient taking differently: Take 160 mg by mouth at bedtime.) 90 tablet 1  . Flaxseed, Linseed, (FLAXSEED OIL PO) Take 1,400 mg by mouth at bedtime.    . Ipratropium-Albuterol (COMBIVENT RESPIMAT) 20-100 MCG/ACT AERS respimat Inhale 1 puff into the lungs 4 (four) times daily as needed for wheezing or shortness of breath. 4 g 5  . montelukast (SINGULAIR) 10 MG  tablet Take 1 tablet (10 mg total) by mouth daily. (Patient taking differently: Take 10 mg by mouth at bedtime.) 90 tablet 3  . nitroGLYCERIN (NITROSTAT) 0.4 MG SL tablet Place 1 tablet (0.4 mg total) under the tongue every 5 (five) minutes as needed for chest pain. 25 tablet 3  . OneTouch Delica Lancets 83J MISC Check Blood sugar twice daily. Dx:E11.9 100 each 12  . ONETOUCH ULTRA test strip ONE TOUCH ULTRA BLUE TEST STRIPS. CHECK BLOOD SUGAR TWICE DAILY E.119 100 strip 12  . pantoprazole (PROTONIX) 40 MG tablet Take 1 tablet (40 mg total) by mouth daily. (Patient taking differently: Take 40 mg by mouth at bedtime.) 90 tablet 3  . Probiotic Product (PROBIOTIC PO) Take 1 capsule by mouth at bedtime.    [name] Propylene Glycol 0.6 % SOLN Apply 1 drop to eye 4 (four) times daily as needed (dry/irritated eyes).    . repaglinide (PRANDIN) 1 MG tablet Take 1 tablet (1 mg total) by mouth 2 (two) times daily before  a meal. 60 tablet 11  . Semaglutide (RYBELSUS) 14 MG TABS Take 14 mg by mouth daily. (Patient taking differently: Take 14 mg by mouth in the morning.) 90 tablet 3  . mometasone (NASONEX) 50 MCG/ACT nasal spray Place 2 sprays into the nose daily. (Patient taking differently: Place 2 sprays into the nose daily as needed (congestion).) 17 g 2   No current facility-administered medications on file prior to visit.     Objective:  Objective  Physical Exam Vitals and nursing note reviewed.  Constitutional:      General: She is not in acute distress.    Appearance: Normal appearance. She is well-developed. She is not ill-appearing.  HENT:     Head: Normocephalic and atraumatic.     Right Ear: External ear normal.     Left Ear: External ear normal.     Nose: Nose normal.  Eyes:     Extraocular Movements: Extraocular movements intact.     Pupils: Pupils are equal, round, and reactive to light.  Cardiovascular:     Rate and Rhythm: Normal rate and regular rhythm.     Pulses: Normal pulses.      Heart sounds: Normal heart sounds. No murmur heard. No friction rub. No gallop.   Pulmonary:     Effort: Pulmonary effort is normal. No respiratory distress.     Breath sounds: Normal breath sounds. No wheezing, rhonchi or rales.  Abdominal:     General: Bowel sounds are normal. There is no distension.     Palpations: Abdomen is soft.     Tenderness: There is no abdominal tenderness. There is no guarding.     Hernia: No hernia is present.  Musculoskeletal:        General: Normal range of motion.     Cervical back: Normal range of motion and neck supple.     Right lower leg: No edema.     Left lower leg: No edema.  Skin:    General: Skin is warm and dry.  Neurological:     Mental Status: She is alert and oriented to person, place, and time.  Psychiatric:        Behavior: Behavior normal.        Thought Content: Thought content normal.    BP 110/62 (BP Location: Right Arm, Patient Position: Sitting, Cuff Size: Large)   Pulse 70   Resp 18   Ht 5\' 1"  (1.549 m)   Wt 167 lb 3.2 oz (75.8 kg)   SpO2 95%   BMI 31.59 kg/m  Wt Readings from Last 3 Encounters:  04/20/20 167 lb 3.2 oz (75.8 kg)  04/09/20 167 lb (75.8 kg)  04/05/20 167 lb 1.9 oz (75.8 kg)     Lab Results  Component Value Date   WBC 5.9 04/05/2020   HGB 14.5 04/05/2020   HCT 44.0 04/05/2020   PLT 334 04/05/2020   GLUCOSE 143 (H) 04/05/2020   CHOL 144 03/20/2020   TRIG 103.0 03/20/2020   HDL 45.40 03/20/2020   LDLDIRECT 128.1 10/05/2013   LDLCALC 78 03/20/2020   ALT 29 03/20/2020   AST 20 03/20/2020   NA 141 04/05/2020   K 5.5 (H) 04/05/2020   CL 104 04/05/2020   CREATININE 0.87 04/05/2020   BUN 11 04/05/2020   CO2 23 04/05/2020   TSH 1.03 10/21/2019   HGBA1C 7.3 (A) 03/09/2020   MICROALBUR 0.6 10/21/2019    Prox Cx to Mid Cx lesion is 25% stenosed. Mild diffuse disease in the LAD,  otherwise angiographically minimal CAD.  There is hyperdynamic left ventricular systolic function. The left ventricular  ejection fraction is greater than 65% by visual estimate.  LV end diastolic pressure is normal.   SUMMARY  Angiographically normal none minimal CAD with somewhat tortuous vessels.    Hyperdynamic LV function with normal NP.  Consider non-anginal chest pain vs. Potentially coronary vasospasm (although no evidence of spasm noted, vessels are tortuous)     RECOMMENDATIONS  DISCHARGE home after bedrest  Progressive medical management of hypertension-consider calcium channel blocker for rate control and vasodilation   Follow-up with Dr. Geraldo Pitter  .  Assessment & Plan:  Plan   No orders of the defined types were placed in this encounter.   Problem List Items Addressed This Visit      Unprioritized   ESOPHAGEAL STRICTURE    F/u GI  Take protonix bid  Last egd 2005      Essential hypertension (Chronic)    Well controlled, no changes to meds. Encouraged heart healthy diet such as the DASH diet and exercise as tolerated.       GERD    Take protonix bid  F/u gi      Relevant Orders   Ambulatory referral to Gastroenterology   Hyperlipidemia associated with type 2 diabetes mellitus ([place]) - Primary (Chronic)    Tolerating statin, encouraged heart healthy diet, avoid trans fats, minimize simple carbs and saturated fats. Increase exercise as tolerated Lab Results  Component Value Date   CHOL 144 03/20/2020   HDL 45.40 03/20/2020   LDLCALC 78 03/20/2020   LDLDIRECT 128.1 10/05/2013   TRIG 103.0 03/20/2020   CHOLHDL 3 03/20/2020        Relevant Orders   Amb Ref to Medical Weight Management   Uncontrolled type 2 diabetes mellitus with hyperglycemia ([place])    Per endo      Relevant Orders   Amb Ref to Medical Weight Management    Other Visit Diagnoses    Primary hypertension       Relevant Orders   Amb Ref to Medical Weight Management   Morbid obesity ([place])       Relevant Orders   Amb Ref to Medical Weight Management   Esophageal stricture       Relevant  Orders   Ambulatory referral to Gastroenterology   Need for pneumococcal vaccination       Relevant Orders   Pneumococcal polysaccharide vaccine 23-valent greater than or equal to 2yo subcutaneous/IM (Completed)      Follow-up: Return in about 6 months (around 10/20/2020), or if symptoms worsen or fail to improve, for hypertension, hyperlipidemia.   I,Gordon Zheng,acting as a Education administrator for [employer], DO.,have documented all relevant documentation on the behalf of [name], DO,as directed by  [name], DO while in the presence of [place], DO, have reviewed all documentation for this visit. The documentation on 04/20/20 for the exam, diagnosis, procedures, and orders are all accurate and complete.  [name], DO

## 2020-04-20 NOTE — Assessment & Plan Note (Addendum)
Tolerating statin, encouraged heart healthy diet, avoid trans fats, minimize simple carbs and saturated fats. Increase exercise as tolerated Lab Results  Component Value Date   CHOL 144 03/20/2020   HDL 45.40 03/20/2020   LDLCALC 78 03/20/2020   LDLDIRECT 128.1 10/05/2013   TRIG 103.0 03/20/2020   CHOLHDL 3 03/20/2020

## 2020-04-20 NOTE — Assessment & Plan Note (Signed)
Take protonix bid  F/u gi

## 2020-04-20 NOTE — Patient Instructions (Signed)

## 2020-04-20 NOTE — Assessment & Plan Note (Signed)
Well controlled, no changes to meds. Encouraged heart healthy diet such as the DASH diet and exercise as tolerated.  °

## 2020-04-20 NOTE — Assessment & Plan Note (Signed)
Per endo °

## 2020-04-20 NOTE — Assessment & Plan Note (Addendum)
F/u GI  Take protonix bid  Last egd 2005

## 2020-04-27 ENCOUNTER — Other Ambulatory Visit (HOSPITAL_BASED_OUTPATIENT_CLINIC_OR_DEPARTMENT_OTHER): Payer: BC Managed Care – PPO

## 2020-05-02 ENCOUNTER — Encounter: Payer: Self-pay | Admitting: Family Medicine

## 2020-05-03 ENCOUNTER — Other Ambulatory Visit: Payer: Self-pay | Admitting: Family Medicine

## 2020-05-03 DIAGNOSIS — K219 Gastro-esophageal reflux disease without esophagitis: Secondary | ICD-10-CM

## 2020-05-03 MED ORDER — PANTOPRAZOLE SODIUM 40 MG PO TBEC
40.0000 mg | DELAYED_RELEASE_TABLET | Freq: Two times a day (BID) | ORAL | 3 refills | Status: DC
Start: 2020-05-03 — End: 2020-10-26

## 2020-05-03 NOTE — Telephone Encounter (Signed)
I sent it in and changed directions to bid

## 2020-05-16 ENCOUNTER — Encounter (INDEPENDENT_AMBULATORY_CARE_PROVIDER_SITE_OTHER): Payer: Self-pay | Admitting: Bariatrics

## 2020-05-16 ENCOUNTER — Other Ambulatory Visit: Payer: Self-pay

## 2020-05-16 ENCOUNTER — Ambulatory Visit (INDEPENDENT_AMBULATORY_CARE_PROVIDER_SITE_OTHER): Payer: BC Managed Care – PPO | Admitting: Bariatrics

## 2020-05-16 VITALS — BP 126/79 | HR 77 | Temp 98.2°F | Ht 60.0 in | Wt 163.0 lb

## 2020-05-16 DIAGNOSIS — K219 Gastro-esophageal reflux disease without esophagitis: Secondary | ICD-10-CM

## 2020-05-16 DIAGNOSIS — Z1331 Encounter for screening for depression: Secondary | ICD-10-CM | POA: Diagnosis not present

## 2020-05-16 DIAGNOSIS — E669 Obesity, unspecified: Secondary | ICD-10-CM

## 2020-05-16 DIAGNOSIS — E1165 Type 2 diabetes mellitus with hyperglycemia: Secondary | ICD-10-CM

## 2020-05-16 DIAGNOSIS — R5383 Other fatigue: Secondary | ICD-10-CM

## 2020-05-16 DIAGNOSIS — R0602 Shortness of breath: Secondary | ICD-10-CM

## 2020-05-16 DIAGNOSIS — E785 Hyperlipidemia, unspecified: Secondary | ICD-10-CM | POA: Diagnosis not present

## 2020-05-16 DIAGNOSIS — Z9189 Other specified personal risk factors, not elsewhere classified: Secondary | ICD-10-CM | POA: Diagnosis not present

## 2020-05-16 DIAGNOSIS — E1169 Type 2 diabetes mellitus with other specified complication: Secondary | ICD-10-CM | POA: Diagnosis not present

## 2020-05-16 DIAGNOSIS — E559 Vitamin D deficiency, unspecified: Secondary | ICD-10-CM

## 2020-05-16 DIAGNOSIS — Z6831 Body mass index (BMI) 31.0-31.9, adult: Secondary | ICD-10-CM

## 2020-05-16 DIAGNOSIS — Z0289 Encounter for other administrative examinations: Secondary | ICD-10-CM

## 2020-05-16 DIAGNOSIS — E1159 Type 2 diabetes mellitus with other circulatory complications: Secondary | ICD-10-CM | POA: Diagnosis not present

## 2020-05-16 DIAGNOSIS — I152 Hypertension secondary to endocrine disorders: Secondary | ICD-10-CM

## 2020-05-16 DIAGNOSIS — E7849 Other hyperlipidemia: Secondary | ICD-10-CM

## 2020-05-16 LAB — HM DIABETES EYE EXAM

## 2020-05-17 LAB — COMPREHENSIVE METABOLIC PANEL
ALT: 36 IU/L — ABNORMAL HIGH (ref 0–32)
AST: 27 IU/L (ref 0–40)
Albumin/Globulin Ratio: 1.8 (ref 1.2–2.2)
Albumin: 4.7 g/dL (ref 3.8–4.8)
Alkaline Phosphatase: 119 IU/L (ref 44–121)
BUN/Creatinine Ratio: 23 (ref 12–28)
BUN: 21 mg/dL (ref 8–27)
Bilirubin Total: 0.7 mg/dL (ref 0.0–1.2)
CO2: 24 mmol/L (ref 20–29)
Calcium: 10.4 mg/dL — ABNORMAL HIGH (ref 8.7–10.3)
Chloride: 101 mmol/L (ref 96–106)
Creatinine, Ser: 0.93 mg/dL (ref 0.57–1.00)
Globulin, Total: 2.6 g/dL (ref 1.5–4.5)
Glucose: 132 mg/dL — ABNORMAL HIGH (ref 65–99)
Potassium: 4.9 mmol/L (ref 3.5–5.2)
Sodium: 141 mmol/L (ref 134–144)
Total Protein: 7.3 g/dL (ref 6.0–8.5)
eGFR: 69 mL/min/{1.73_m2} (ref >59)

## 2020-05-17 LAB — TSH: TSH: 2.06 u[IU]/mL (ref 0.450–4.500)

## 2020-05-17 LAB — LIPID PANEL WITH LDL/HDL RATIO
Cholesterol, Total: 158 mg/dL (ref 100–199)
HDL: 47 mg/dL (ref >39)
LDL Chol Calc (NIH): 90 mg/dL (ref 0–99)
LDL/HDL Ratio: 1.9 ratio (ref 0.0–3.2)
Triglycerides: 119 mg/dL (ref 0–149)
VLDL Cholesterol Cal: 21 mg/dL (ref 5–40)

## 2020-05-17 LAB — INSULIN, RANDOM: INSULIN: 46.9 u[IU]/mL — ABNORMAL HIGH (ref 2.6–24.9)

## 2020-05-17 LAB — T3: T3, Total: 138 ng/dL (ref 71–180)

## 2020-05-17 LAB — HEMOGLOBIN A1C
Est. average glucose Bld gHb Est-mCnc: 180 mg/dL
Hgb A1c MFr Bld: 7.9 % — ABNORMAL HIGH (ref 4.8–5.6)

## 2020-05-17 LAB — VITAMIN D 25 HYDROXY (VIT D DEFICIENCY, FRACTURES): Vit D, 25-Hydroxy: 26.5 ng/mL — ABNORMAL LOW (ref 30.0–100.0)

## 2020-05-17 LAB — T4, FREE: Free T4: 1.35 ng/dL (ref 0.82–1.77)

## 2020-05-21 ENCOUNTER — Encounter (INDEPENDENT_AMBULATORY_CARE_PROVIDER_SITE_OTHER): Payer: Self-pay | Admitting: Bariatrics

## 2020-05-21 DIAGNOSIS — E559 Vitamin D deficiency, unspecified: Secondary | ICD-10-CM

## 2020-05-21 HISTORY — DX: Vitamin D deficiency, unspecified: E55.9

## 2020-05-21 NOTE — Progress Notes (Signed)
Dear Dr. Carollee Herter,   Thank you for referring Nicole Richard to our clinic. The following note includes my evaluation and treatment recommendations.  Chief Complaint:   OBESITY Nicole Richard (MR# [id number]) is a 65 y.o. female who presents for evaluation and treatment of obesity and related comorbidities. Current BMI is Body mass index is 31.83 kg/m. Nicole Richard has been struggling with her weight for many years and has been unsuccessful in either losing weight, maintaining weight loss, or reaching her healthy weight goal.  Nicole Richard is currently in the action stage of change and ready to dedicate time achieving and maintaining a healthier weight. Nicole Richard is interested in becoming our patient and working on intensive lifestyle modifications including (but not limited to) diet and exercise for weight loss.  Nicole Richard does like to cook, but notes that cooking for 1 person is difficult.  Nicole Richard's habits were reviewed today and are as follows: her desired weight loss is 48 lbs, she started gaining weight at age [age], her heaviest weight ever was 195 pounds, she has significant food cravings issues, she snacks frequently in the evenings, she skips meals frequently, she is frequently drinking liquids with calories, she frequently makes poor food choices, she has problems with excessive hunger, she frequently eats larger portions than normal, she has binge eating behaviors and she struggles with emotional eating.  Depression Screen Nicole Richard's Food and Mood (modified PHQ-9) score was 11.  Depression screen [facility] 2/9 05/16/2020  Decreased Interest 3  Down, Depressed, Hopeless 1  PHQ - 2 Score 4  Altered sleeping 2  Tired, decreased energy 2  Change in appetite 1  Feeling bad or failure about yourself  1  Trouble concentrating 1  Moving slowly or fidgety/restless 0  Suicidal thoughts 0  PHQ-9 Score 11  Difficult doing work/chores Somewhat difficult   Subjective:   1. Other fatigue Nicole Richard admits  to daytime somnolence and admits to waking up still tired. Patent has a history of symptoms of daytime fatigue, morning fatigue and morning headache. Nicole Richard generally gets 4-7 hours of sleep per night, and states that she has poor sleep quality. Snoring is present. Apneic episodes are present. Epworth Sleepiness Score is 11.  2. SOB (shortness of breath) on exertion Avaleen notes increasing shortness of breath with exercising and seems to be worsening over time with weight gain. She notes getting out of breath sooner with activity than she used to. This has gotten worse recently. Nicole Richard denies shortness of breath at rest or orthopnea.  3. Uncontrolled type 2 diabetes mellitus with hyperglycemia (Nicole Richard) Nicole Richard is taking Rybelsus, bromocriptine, and Prandin.  4. Hypertension associated with type 2 diabetes mellitus (Nicole Richard) Nicole Richard's BP is controlled. She is taking atenolol.  5. Other hyperlipidemia Nicole Richard is taking fenofibrate and atorvastatin.  6. Gastroesophageal reflux disease, unspecified whether esophagitis present Nicole Richard is taking pantoprazole.  7. Vitamin D deficiency Nicole Richard is taking a Vit D supplement.  8. At risk for activity intolerance Nicole Richard is at risk for exercise intolerance due to obesity.  Assessment/Plan:   1. Other fatigue Valerie does feel that her weight is causing her energy to be lower than it should be. Fatigue may be related to obesity, depression or many other causes. Labs will be ordered, and in the meanwhile, Nicole Richard will focus on self care including making healthy food choices, increasing physical activity and focusing on stress reduction. Check labs today.  - Comprehensive metabolic panel - Hemoglobin A1c - Insulin, random - Lipid Panel With LDL/HDL  Ratio - T3 - T4, free - TSH - VITAMIN D 25 Hydroxy (Vit-D Deficiency, Fractures)  2. SOB (shortness of breath) on exertion Nicole Richard does feel that she gets out of breath more easily that she used to when she  exercises. Nicole Richard's shortness of breath appears to be obesity related and exercise induced. She has agreed to work on weight loss and gradually increase exercise to treat her exercise induced shortness of breath. Will continue to monitor closely. Check labs today.  - Lipid Panel With LDL/HDL Ratio - T3 - T4, free - TSH  3. Uncontrolled type 2 diabetes mellitus with hyperglycemia (HCC) Good blood sugar control is important to decrease the likelihood of diabetic complications such as nephropathy, neuropathy, limb loss, blindness, coronary artery disease, and death. Intensive lifestyle modification including diet, exercise and weight loss are the first line of treatment for diabetes. Continue current treatment plan. Check labs today.  - Hemoglobin A1c - Insulin, random  4. Hypertension associated with type 2 diabetes mellitus (Nicole Richard) Nicole Richard is working on healthy weight loss and exercise to improve blood pressure control. We will watch for signs of hypotension as she continues her lifestyle modifications. Continue current treatment plan.  5. Other hyperlipidemia Cardiovascular risk and specific lipid/LDL goals reviewed.  We discussed several lifestyle modifications today and Nicole Richard will continue to work on diet, exercise and weight loss efforts. Orders and follow up as documented in patient record. Continue current treatment plan. Check labs today.  Counseling Intensive lifestyle modifications are the first line treatment for this issue. . Dietary changes: Increase soluble fiber. Decrease simple carbohydrates. . Exercise changes: Moderate to vigorous-intensity aerobic activity 150 minutes per week if tolerated. . Lipid-lowering medications: see documented in medical record.  - Lipid Panel With LDL/HDL Ratio  6. Gastroesophageal reflux disease, unspecified whether esophagitis present Intensive lifestyle modifications are the first line treatment for this issue. We discussed several lifestyle  modifications today and she will continue to work on diet, exercise and weight loss efforts. Orders and follow up as documented in patient record. Continue current treatment plan.  Counseling . If a person has gastroesophageal reflux disease (GERD), food and stomach acid move back up into the esophagus and cause symptoms or problems such as damage to the esophagus. . Anti-reflux measures include: raising the head of the bed, avoiding tight clothing or belts, avoiding eating late at night, not lying down shortly after mealtime, and achieving weight loss. . Avoid ASA, NSAID's, caffeine, alcohol, and tobacco.  . OTC Pepcid and/or Tums are often very helpful for as needed use.  [name] However, for persisting chronic or daily symptoms, stronger medications like Omeprazole may be needed. . You may need to avoid foods and drinks such as: ? Coffee and tea (with or without caffeine). ? Drinks that contain alcohol. ? Energy drinks and sports drinks. ? Bubbly (carbonated) drinks or sodas. ? Chocolate and cocoa. ? Peppermint and mint flavorings. ? Garlic and onions. ? Horseradish. ? Spicy and acidic foods. These include peppers, chili powder, curry powder, vinegar, hot sauces, and BBQ sauce. ? Citrus fruit juices and citrus fruits, such as oranges, lemons, and limes. ? Tomato-based foods. These include red sauce, chili, salsa, and pizza with red sauce. ? Fried and fatty foods. These include donuts, french fries, potato chips, and high-fat dressings. ? High-fat meats. These include hot dogs, rib eye steak, sausage, ham, and bacon.  7. Vitamin D deficiency Low Vitamin D level contributes to fatigue and are associated with obesity, breast, and colon  cancer. She agrees to continue to take Vitamin D @50 ,000 IU and will follow-up for routine testing of Vitamin D, at least 2-3 times per year to avoid over-replacement. Check labs today.  - VITAMIN D 25 Hydroxy (Vit-D Deficiency, Fractures)  8. Depression  screen Daijah had a positive depression screening. Depression is commonly associated with obesity and often results in emotional eating behaviors. We will monitor this closely and work on CBT to help improve the non-hunger eating patterns. Referral to Psychology may be required if no improvement is seen as she continues in our clinic.  9. At risk for activity intolerance Nicole Richard was given approximately 15 minutes of exercise intolerance counseling today. She is 65 y.o. female and has risk factors exercise intolerance including obesity. We discussed intensive lifestyle modifications today with an emphasis on specific weight loss instructions and strategies. Mele will slowly increase activity as tolerated.  Repetitive spaced learning was employed today to elicit superior memory formation and behavioral change.  10. Class 1 obesity with serious comorbidity and body mass index (BMI) of 31.0 to 31.9 in adult, unspecified obesity type  Naelani is currently in the action stage of change and her goal is to continue with weight loss efforts. I recommend Lola begin the structured treatment plan as follows:  She has agreed to the Category 2 Plan.  Meal plan Intentional eating 04/05/2020 labs reviewed Stop all sugary drinks  Exercise goals: No exercise has been prescribed at this time.   Behavioral modification strategies: increasing lean protein intake, decreasing simple carbohydrates, increasing vegetables, increasing water intake, decreasing eating out, no skipping meals, meal planning and cooking strategies, keeping healthy foods in the home and planning for success.  She was informed of the importance of frequent follow-up visits to maximize her success with intensive lifestyle modifications for her multiple health conditions. She was informed we would discuss her lab results at her next visit unless there is a critical issue that needs to be addressed sooner. Kinjal agreed to keep her next visit at  the agreed upon time to discuss these results.  Objective:   Blood pressure 126/79, pulse 77, temperature 98.2 F (36.8 C), height 5' (1.524 m), weight 163 lb (73.9 kg), SpO2 95 %. Body mass index is 31.83 kg/m.  EKG: Normal sinus Nicole Richard, rate 74 (04/05/2020).   Indirect Calorimeter completed today shows a VO2 of 267 and a REE of 1861.  Her calculated basal metabolic rate is [id number] thus her basal metabolic rate is better than expected.  General: Cooperative, alert, well developed, in no acute distress. HEENT: Conjunctivae and lids unremarkable. Cardiovascular: Regular Nicole Richard.  Lungs: Normal work of breathing. Neurologic: No focal deficits.   Lab Results  Component Value Date   CREATININE 0.93 05/16/2020   BUN 21 05/16/2020   NA 141 05/16/2020   K 4.9 05/16/2020   CL 101 05/16/2020   CO2 24 05/16/2020   Lab Results  Component Value Date   ALT 36 (H) 05/16/2020   AST 27 05/16/2020   ALKPHOS 119 05/16/2020   BILITOT 0.7 05/16/2020   Lab Results  Component Value Date   HGBA1C 7.9 (H) 05/16/2020   HGBA1C 7.3 (A) 03/09/2020   HGBA1C 8.4 (A) 12/28/2019   HGBA1C 8.1 (H) 10/21/2019   HGBA1C 7.1 (A) 07/15/2019   Lab Results  Component Value Date   INSULIN 46.9 (H) 05/16/2020   Lab Results  Component Value Date   TSH 2.060 05/16/2020   Lab Results  Component Value Date   CHOL 158  05/16/2020   HDL 47 05/16/2020   LDLCALC 90 05/16/2020   LDLDIRECT 128.1 10/05/2013   TRIG 119 05/16/2020   CHOLHDL 3 03/20/2020   Lab Results  Component Value Date   WBC 5.9 04/05/2020   HGB 14.5 04/05/2020   HCT 44.0 04/05/2020   MCV 90 04/05/2020   PLT 334 04/05/2020    Attestation Statements:   Reviewed by clinician on day of visit: allergies, medications, problem list, medical history, surgical history, family history, social history, and previous encounter notes.  Coral Ceo, am acting as Location manager for [employer], DO.  I have reviewed the above documentation  for accuracy and completeness, and I agree with the above. Jearld Lesch, DO

## 2020-05-22 ENCOUNTER — Encounter (INDEPENDENT_AMBULATORY_CARE_PROVIDER_SITE_OTHER): Payer: Self-pay | Admitting: Bariatrics

## 2020-05-29 ENCOUNTER — Encounter: Payer: Self-pay | Admitting: Family Medicine

## 2020-05-30 ENCOUNTER — Encounter (INDEPENDENT_AMBULATORY_CARE_PROVIDER_SITE_OTHER): Payer: Self-pay | Admitting: Bariatrics

## 2020-05-30 ENCOUNTER — Other Ambulatory Visit: Payer: Self-pay

## 2020-05-30 ENCOUNTER — Ambulatory Visit (INDEPENDENT_AMBULATORY_CARE_PROVIDER_SITE_OTHER): Payer: BC Managed Care – PPO | Admitting: Bariatrics

## 2020-05-30 VITALS — BP 122/80 | HR 69 | Temp 98.5°F | Ht 60.0 in | Wt 161.0 lb

## 2020-05-30 DIAGNOSIS — Z9189 Other specified personal risk factors, not elsewhere classified: Secondary | ICD-10-CM

## 2020-05-30 DIAGNOSIS — E1169 Type 2 diabetes mellitus with other specified complication: Secondary | ICD-10-CM | POA: Diagnosis not present

## 2020-05-30 DIAGNOSIS — E559 Vitamin D deficiency, unspecified: Secondary | ICD-10-CM | POA: Diagnosis not present

## 2020-05-30 DIAGNOSIS — E669 Obesity, unspecified: Secondary | ICD-10-CM | POA: Diagnosis not present

## 2020-05-30 DIAGNOSIS — Z6831 Body mass index (BMI) 31.0-31.9, adult: Secondary | ICD-10-CM | POA: Diagnosis not present

## 2020-05-30 MED ORDER — VITAMIN D (ERGOCALCIFEROL) 1.25 MG (50000 UNIT) PO CAPS: 50000.0000 [IU] | ORAL_CAPSULE | ORAL | 0 refills | Status: DC

## 2020-05-30 NOTE — Telephone Encounter (Signed)
Noted  

## 2020-05-31 ENCOUNTER — Encounter (INDEPENDENT_AMBULATORY_CARE_PROVIDER_SITE_OTHER): Payer: Self-pay | Admitting: Bariatrics

## 2020-05-31 NOTE — Progress Notes (Signed)
Chief Complaint:   OBESITY Nicole Richard is here to discuss her progress with her obesity treatment plan along with follow-up of her obesity related diagnoses. Nicole Richard is on the Category 2 Plan and states she is following her eating plan approximately 100% of the time. Nicole Richard states she is not currently exercising.  Today's visit was #: 2 Starting weight: 163 lbs Starting date: 05/16/2020 Today's weight: 161 lbs Today's date: 05/30/2020 Total lbs lost to date: 2 Total lbs lost since last in-office visit: 2  Interim History: Nicole Richard is down 2 lbs from her last visit. She struggled with some aspects.  Subjective:   1. Vitamin D deficiency Nicole Richard's Vit D level is 26.5. She is not on Vit D supplementation.  2. Diabetes mellitus type 2 in obese ([place]) Nicole Richard is on Parlodel and Rybelsus. Her A1c is 7.9 and insulin level 46.9.  3. At risk for osteoporosis Nicole Richard is at higher risk of osteopenia and osteoporosis due to Vitamin D deficiency.   Assessment/Plan:   1. Vitamin D deficiency Low Vitamin D level contributes to fatigue and are associated with obesity, breast, and colon cancer. She agrees to continue to take prescription Vitamin D @50 ,000 IU every week and will follow-up for routine testing of Vitamin D, at least 2-3 times per year to avoid over-replacement.  - Vitamin D, Ergocalciferol, (DRISDOL) 1.25 MG (50000 UNIT) CAPS capsule; Take 1 capsule (50,000 Units total) by mouth every 7 (seven) days.  Dispense: 4 capsule; Refill: 0  2. Diabetes mellitus type 2 in obese (HCC) Good blood sugar control is important to decrease the likelihood of diabetic complications such as nephropathy, neuropathy, limb loss, blindness, coronary artery disease, and death. Intensive lifestyle modification including diet, exercise and weight loss are the first line of treatment for diabetes. Continue medications and follow up with endocrinology.  3. At risk for osteoporosis Nicole Richard was given approximately 15  minutes of osteoporosis prevention counseling today. Nicole Richard is at risk for osteopenia and osteoporosis due to her Vitamin D deficiency. She was encouraged to take her Vitamin D and follow her higher calcium diet and increase strengthening exercise to help strengthen her bones and decrease her risk of osteopenia and osteoporosis.  Repetitive spaced learning was employed today to elicit superior memory formation and behavioral change.  4. Obesity, current BMI 31  Nicole Richard is currently in the action stage of change. As such, her goal is to continue with weight loss efforts. She has agreed to the Category 2 Plan.   05/16/2020 labs reviewed with pt. Increase water intake.  Exercise goals: Walking and will water the plants.  Behavioral modification strategies: increasing lean protein intake, decreasing simple carbohydrates, increasing vegetables, increasing water intake, decreasing eating out, no skipping meals, meal planning and cooking strategies, keeping healthy foods in the home and planning for success.  Nicole Richard has agreed to follow-up with our clinic in 2 weeks. She was informed of the importance of frequent follow-up visits to maximize her success with intensive lifestyle modifications for her multiple health conditions.   Objective:   Blood pressure 122/80, pulse 69, temperature 98.5 F (36.9 C), height 5' (1.524 m), weight 161 lb (73 kg), SpO2 94 %. Body mass index is 31.44 kg/m.  General: Cooperative, alert, well developed, in no acute distress. HEENT: Conjunctivae and lids unremarkable. Cardiovascular: Regular rhythm.  Lungs: Normal work of breathing. Neurologic: No focal deficits.   Lab Results  Component Value Date   CREATININE 0.93 05/16/2020   BUN 21 05/16/2020   NA  141 05/16/2020   K 4.9 05/16/2020   CL 101 05/16/2020   CO2 24 05/16/2020   Lab Results  Component Value Date   ALT 36 (H) 05/16/2020   AST 27 05/16/2020   ALKPHOS 119 05/16/2020   BILITOT 0.7 05/16/2020    Lab Results  Component Value Date   HGBA1C 7.9 (H) 05/16/2020   HGBA1C 7.3 (A) 03/09/2020   HGBA1C 8.4 (A) 12/28/2019   HGBA1C 8.1 (H) 10/21/2019   HGBA1C 7.1 (A) 07/15/2019   Lab Results  Component Value Date   INSULIN 46.9 (H) 05/16/2020   Lab Results  Component Value Date   TSH 2.060 05/16/2020   Lab Results  Component Value Date   CHOL 158 05/16/2020   HDL 47 05/16/2020   LDLCALC 90 05/16/2020   LDLDIRECT 128.1 10/05/2013   TRIG 119 05/16/2020   CHOLHDL 3 03/20/2020   Lab Results  Component Value Date   WBC 5.9 04/05/2020   HGB 14.5 04/05/2020   HCT 44.0 04/05/2020   MCV 90 04/05/2020   PLT 334 04/05/2020    Attestation Statements:   Reviewed by clinician on day of visit: allergies, medications, problem list, medical history, surgical history, family history, social history, and previous encounter notes.  Coral Ceo, CMA, am acting as Location manager for [employer], DO.  I have reviewed the above documentation for accuracy and completeness, and I agree with the above. Jearld Lesch, DO

## 2020-06-05 ENCOUNTER — Encounter: Payer: Self-pay | Admitting: Family Medicine

## 2020-06-08 ENCOUNTER — Ambulatory Visit: Payer: BC Managed Care – PPO | Admitting: Endocrinology

## 2020-06-08 ENCOUNTER — Other Ambulatory Visit: Payer: Self-pay

## 2020-06-08 VITALS — BP 128/70 | HR 71 | Ht 60.0 in | Wt 163.4 lb

## 2020-06-08 DIAGNOSIS — E119 Type 2 diabetes mellitus without complications: Secondary | ICD-10-CM | POA: Diagnosis not present

## 2020-06-08 LAB — POCT GLYCOSYLATED HEMOGLOBIN (HGB A1C): Hemoglobin A1C: 6.8 % — AB (ref 4.0–5.6)

## 2020-06-08 MED ORDER — REPAGLINIDE 2 MG PO TABS: 2.0000 mg | ORAL_TABLET | Freq: Two times a day (BID) | ORAL | 3 refills | Status: DC

## 2020-06-08 NOTE — Patient Instructions (Addendum)
I have sent a prescription to your pharmacy, to double the repaglinide.   Please continue the same other diabetes medications.   check your blood sugar once a day.  vary the time of day when you check, between before the 3 meals, and at bedtime.  also check if you have symptoms of your blood sugar being too high or too low.  please keep a record of the readings and bring it to your next appointment here (or you can bring the meter itself).  You can write it on any piece of paper.  please call us sooner if your blood sugar goes below 70, or if most of your readings are over 200. Please come back for a follow-up appointment in 3 months.

## 2020-06-08 NOTE — Progress Notes (Signed)
Subjective:    Patient ID: Nicole Richard, female    DOB: 09-05-1955, 65 y.o.   MRN: [id number]  HPI Pt returns for f/u of diabetes mellitus: DM type: 2 Dx'ed: 0630 Complications: CAD Therapy: 3 oral meds.  GDM: never DKA: never Severe hypoglycemia: never.  Pancreatitis: never.  Other: she has never taken insulin; she did not tolerate metformin (abd bloating and nausea).   Interval history: She takes meds as rx'ed.  pt says glucose varies from 135-155.  She has prednisone for heart cath 2 mos ago.   Past Medical History:  Diagnosis Date  . Abdominal pain, left upper quadrant 03/01/2013  . Abnormal liver function tests 03/01/2013  . Allergy    seasonal  . Angina pectoris ([place]) 03/21/2020  . Asthma   . Back pain   . BREAST IMPLANTS, BILATERAL, HX OF 07/28/2007   Qualifier: Diagnosis of  By: [name]    . CYSTOCELE WITH INCOMPLETE UTERINE PROLAPSE 05/18/2006   Qualifier: Diagnosis of  By: [name]    . Diabetes ([place]) 01/29/2014  . Diabetes mellitus without complication ([place])   . Edema of both lower extremities   . ESOPHAGEAL STRICTURE 11/09/2009   Qualifier: Diagnosis of  By: [name] (AAMA), [name]    . Essential hypertension 05/18/2006   Qualifier: Diagnosis of  By: [name]    . GERD 09/14/2009   Qualifier: Diagnosis of  By: [name]    . GERD (gastroesophageal reflux disease)   . HEADACHE 05/18/2006   Qualifier: Diagnosis of  By: [name]    . Hirsutism 05/18/2006   Qualifier: Diagnosis of  By: [name]    . Hyperlipidemia   . Hyperlipidemia associated with type 2 diabetes mellitus ([place]) 04/15/2019  . Hypertension   . Kidney stones   . LAPAROSCOPY, HX OF 05/18/2006   Qualifier: Diagnosis of  By: [name]    . Nonspecific (abnormal) findings on radiological and other examination of biliary tract 03/01/2013  . Obesity (BMI 30-39.9) 10/18/2012  . Other chest pain 03/21/2020  . Palpitation 03/21/2020  . Palpitations   .  POSTMENOPAUSAL STATUS 09/14/2009   Qualifier: Diagnosis of  By: [name]    . SKIN TAG 10/05/2009   Qualifier: Diagnosis of  By: [name]    . SOB (shortness of breath)   . Uncontrolled type 2 diabetes mellitus with hyperglycemia ([place]) 04/15/2019  . Vitamin D deficiency     Past Surgical History:  Procedure Laterality Date  . ABDOMINAL HYSTERECTOMY  08/2004  . APPENDECTOMY    . AUGMENTATION MAMMAPLASTY Bilateral 2000  . BREAST ENHANCEMENT SURGERY  2001  . COLONOSCOPY    . LEFT HEART CATH AND CORONARY ANGIOGRAPHY N/A 04/09/2020   Procedure: LEFT HEART CATH AND CORONARY ANGIOGRAPHY;  Surgeon: Leonie Man, MD;  Location: [facility];  Service: Cardiovascular;  Laterality: N/A;  . POLYPECTOMY  2009   sigmoid polyps  . SHOULDER ARTHROSCOPY W/ ROTATOR CUFF REPAIR Right 06/06/2015   [facility]  . TONSILLECTOMY    . TUBAL LIGATION      Social History   Socioeconomic History  . Marital status: Divorced    Spouse name: Not on file  . Number of children: 2  . Years of education: Not on file  . Highest education level: Not on file  Occupational History  . Occupation: Nurse  Tobacco Use  . Smoking status: Never Smoker  . Smokeless tobacco: Never  Used  Substance and Sexual Activity  . Alcohol use: Yes    Comment: rare  . Drug use: No  . Sexual activity: Not Currently    Partners: Male  Other Topics Concern  . Not on file  Social History Narrative   Buyer, retail , and pt daily   Social Determinants of Health   Financial Resource Strain: Not on file  Food Insecurity: Not on file  Transportation Needs: Not on file  Physical Activity: Insufficiently Active  . Days of Exercise per Week: 3 days  . Minutes of Exercise per Session: 20 min  Stress: Not on file  Social Connections: Not on file  Intimate Partner Violence: Not on file    Current Outpatient Medications on File Prior to Visit  Medication Sig Dispense Refill  . aspirin 81 MG chewable  tablet Chew 81 mg by mouth at bedtime.    [name] atenolol (TENORMIN) 25 MG tablet TAKE 1 TABLET BY MOUTH EVERY DAY (Patient taking differently: Take 25 mg by mouth at bedtime.) 90 tablet 3  . atorvastatin (LIPITOR) 40 MG tablet TAKE 1 TABLET BY MOUTH EVERYDAY AT BEDTIME (Patient taking differently: Take 40 mg by mouth at bedtime. TAKE 1 TABLET BY MOUTH EVERYDAY AT BEDTIME) 90 tablet 1  . bromocriptine (PARLODEL) 2.5 MG tablet Take 0.5 tablets (1.25 mg total) by mouth daily. (Patient taking differently: Take 1.25 mg by mouth in the morning.) 45 tablet 3  . Calcium Carbonate-Vitamin D (OSCAL 500/200 D-3 PO) Take 1 tablet by mouth at bedtime.    [name] CRANBERRY-VITAMIN C PO Take 2 capsules by mouth at bedtime.    . fenofibrate 160 MG tablet Take 1 tablet (160 mg total) by mouth daily. (Patient taking differently: Take 160 mg by mouth at bedtime.) 90 tablet 1  . Flaxseed, Linseed, (FLAXSEED OIL PO) Take 1,400 mg by mouth at bedtime.    . Ipratropium-Albuterol (COMBIVENT RESPIMAT) 20-100 MCG/ACT AERS respimat Inhale 1 puff into the lungs 4 (four) times daily as needed for wheezing or shortness of breath. 4 g 5  . mometasone (NASONEX) 50 MCG/ACT nasal spray Place 2 sprays into the nose daily. (Patient taking differently: Place 2 sprays into the nose daily as needed (congestion).) 17 g 2  . montelukast (SINGULAIR) 10 MG tablet Take 1 tablet (10 mg total) by mouth daily. (Patient taking differently: Take 10 mg by mouth at bedtime.) 90 tablet 3  . nitroGLYCERIN (NITROSTAT) 0.4 MG SL tablet Place 1 tablet (0.4 mg total) under the tongue every 5 (five) minutes as needed for chest pain. 25 tablet 3  . OneTouch Delica Lancets 87F MISC Check Blood sugar twice daily. Dx:E11.9 100 each 12  . ONETOUCH ULTRA test strip ONE TOUCH ULTRA BLUE TEST STRIPS. CHECK BLOOD SUGAR TWICE DAILY E.119 100 strip 12  . pantoprazole (PROTONIX) 40 MG tablet Take 1 tablet (40 mg total) by mouth 2 (two) times daily. 180 tablet 3  . Probiotic  Product (PROBIOTIC PO) Take 1 capsule by mouth at bedtime.    [name] Propylene Glycol 0.6 % SOLN Apply 1 drop to eye 4 (four) times daily as needed (dry/irritated eyes).    . Semaglutide (RYBELSUS) 14 MG TABS Take 14 mg by mouth daily. (Patient taking differently: Take 14 mg by mouth in the morning.) 90 tablet 3  . Vitamin D, Ergocalciferol, (DRISDOL) 1.25 MG (50000 UNIT) CAPS capsule Take 1 capsule (50,000 Units total) by mouth every 7 (seven) days. 4 capsule 0   No current facility-administered medications on file prior  to visit.    Allergies  Allergen Reactions  . Contrast Media [Iodinated Diagnostic Agents] Anaphylaxis  . Metamucil [Psyllium] Anaphylaxis  . Mustard Seed Anaphylaxis    Family History  Problem Relation Age of Onset  . Rheumatic fever Mother   . Irritable bowel syndrome Mother   . Breast cancer Mother   . Diabetes Mother   . High blood pressure Mother   . High Cholesterol Mother   . Heart disease Mother   . Stroke Mother   . Sudden death Mother   . Obesity Mother   . Diabetes Father   . Colon polyps Father   . Kidney disease Father   . High blood pressure Father   . High Cholesterol Father   . Heart disease Father   . Obesity Father   . Coronary artery disease Other   . Hyperlipidemia Other   . Hypertension Other   . Asthma Other   . Cervical cancer Maternal Grandmother   . Breast cancer Maternal Grandmother   . Colon cancer Maternal Grandmother   . Uterine cancer Maternal Grandmother   . Esophageal cancer Neg Hx   . Rectal cancer Neg Hx   . Stomach cancer Neg Hx     BP 128/70 (BP Location: Right Arm, Patient Position: Sitting, Cuff Size: Large)   Pulse 71   Ht 5' (1.524 m)   Wt 163 lb 6.4 oz (74.1 kg)   SpO2 95%   BMI 31.91 kg/m    Review of Systems She denies hypoglycemia    Objective:   Physical Exam VITAL SIGNS:  See vs page.   GENERAL: no distress.   Pulses: dorsalis pedis intact bilat.   MSK: no deformity of the feet CV: trace bilat  leg edema.   Skin:  no ulcer on the feet.  normal color and temp on the feet. Neuro: sensation is intact to touch on the feet, but decreased from normal.  Ext: there is bilateral onychomycosis of the toenails.    A1c=6.8%    Assessment & Plan:  Type 2 DM: uncontrolled, as goal A1c is low 6's. Dye allergy: this might affect today's A1c, but we should still increase rx.   Patient Instructions  I have sent a prescription to your pharmacy, to double the repaglinide.   Please continue the same other diabetes medications.   check your blood sugar once a day.  vary the time of day when you check, between before the 3 meals, and at bedtime.  also check if you have symptoms of your blood sugar being too high or too low.  please keep a record of the readings and bring it to your next appointment here (or you can bring the meter itself).  You can write it on any piece of paper.  please call us sooner if your blood sugar goes below 70, or if most of your readings are over 200. Please come back for a follow-up appointment in 3 months.

## 2020-06-20 ENCOUNTER — Encounter: Payer: Self-pay | Admitting: Family Medicine

## 2020-06-21 ENCOUNTER — Other Ambulatory Visit: Payer: Self-pay

## 2020-06-21 DIAGNOSIS — N200 Calculus of kidney: Secondary | ICD-10-CM | POA: Insufficient documentation

## 2020-06-21 DIAGNOSIS — E559 Vitamin D deficiency, unspecified: Secondary | ICD-10-CM | POA: Insufficient documentation

## 2020-06-21 DIAGNOSIS — R002 Palpitations: Secondary | ICD-10-CM | POA: Insufficient documentation

## 2020-06-21 DIAGNOSIS — R0602 Shortness of breath: Secondary | ICD-10-CM | POA: Insufficient documentation

## 2020-06-21 DIAGNOSIS — M549 Dorsalgia, unspecified: Secondary | ICD-10-CM | POA: Insufficient documentation

## 2020-06-21 DIAGNOSIS — I10 Essential (primary) hypertension: Secondary | ICD-10-CM | POA: Insufficient documentation

## 2020-06-21 DIAGNOSIS — E785 Hyperlipidemia, unspecified: Secondary | ICD-10-CM | POA: Insufficient documentation

## 2020-06-21 DIAGNOSIS — R600 Localized edema: Secondary | ICD-10-CM | POA: Insufficient documentation

## 2020-06-21 NOTE — Telephone Encounter (Signed)
Noted  

## 2020-06-22 ENCOUNTER — Ambulatory Visit: Payer: BC Managed Care – PPO | Admitting: Cardiology

## 2020-06-22 ENCOUNTER — Encounter: Payer: Self-pay | Admitting: Cardiology

## 2020-06-22 ENCOUNTER — Other Ambulatory Visit: Payer: Self-pay

## 2020-06-22 VITALS — BP 104/58 | HR 70 | Ht 61.0 in | Wt 161.1 lb

## 2020-06-22 DIAGNOSIS — I10 Essential (primary) hypertension: Secondary | ICD-10-CM

## 2020-06-22 DIAGNOSIS — R002 Palpitations: Secondary | ICD-10-CM | POA: Diagnosis not present

## 2020-06-22 DIAGNOSIS — E119 Type 2 diabetes mellitus without complications: Secondary | ICD-10-CM | POA: Diagnosis not present

## 2020-06-22 DIAGNOSIS — E782 Mixed hyperlipidemia: Secondary | ICD-10-CM | POA: Insufficient documentation

## 2020-06-22 HISTORY — DX: Mixed hyperlipidemia: E78.2

## 2020-06-22 NOTE — Patient Instructions (Signed)

## 2020-06-22 NOTE — Progress Notes (Signed)
Cardiology Office Note:    Date:  06/22/2020   ID:  APPLE DEARMAS, DOB [birth year], MRN [id number]  PCP:  Carollee Herter, Alferd Apa, DO  Cardiologist:  Jenean Lindau, MD   Referring MD: Carollee Herter, Alferd Apa, *    ASSESSMENT:    1. Essential hypertension   2. Diabetes mellitus without complication (HCC)   3. Palpitations   4. Mixed dyslipidemia    PLAN:    In order of problems listed above:  1. Primary prevention stressed with the patient.  Importance of compliance with diet medication stressed and she vocalized understanding.  She was advised to walk at least 30 minutes a day 5 days a week and she promises to do so. 2. Essential hypertension: Blood pressure stable and diet was emphasized.  Her blood pressure is borderline and I told her to take appropriate measures especially in the hot warm weather when she is outside. 3. Mixed dyslipidemia: Lipids were reviewed and they are fine and I told her they need to be better in view of the diabetes situation. 4. Diabetes mellitus and obesity: Weight reduction stressed.  Lifestyle modification and diet was visited and discussed extensively and she promises to do better.  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 5. Patient will be seen in follow-up appointment in 6 months or earlier if the patient has any concerns    Medication Adjustments/Labs and Tests Ordered: Current medicines are reviewed at length with the patient today.  Concerns regarding medicines are outlined above.  No orders of the defined types were placed in this encounter.  No orders of the defined types were placed in this encounter.    No chief complaint on file.    History of Present Illness:    Nicole Richard is a 65 y.o. female.  Patient has past medical history of essential hypertension dyslipidemia and obesity.  She had chest pains which were suggestive of angina pectoris and underwent coronary  angiography.  Fortunately this revealed normal coronaries.  She is fine and happy about it.  She has started walking more than a mile on a regular basis now.  She also has joined a weight loss and wellness program and is happy about it.  At the time of my evaluation, the patient is alert awake oriented and in no distress.  Past Medical History:  Diagnosis Date  . Abdominal pain, left upper quadrant 03/01/2013  . Abnormal liver function tests 03/01/2013  . Allergy    seasonal  . Angina at rest [facility]) 03/21/2020  . Angina pectoris ([place]) 03/21/2020  . Asthma   . Back pain   . BREAST IMPLANTS, BILATERAL, HX OF 07/28/2007   Qualifier: Diagnosis of  By: [name]    . CYSTOCELE WITH INCOMPLETE UTERINE PROLAPSE 05/18/2006   Qualifier: Diagnosis of  By: [name]    . Diabetes ([place]) 01/29/2014  . Diabetes mellitus without complication ([place])   . Edema of both lower extremities   . ESOPHAGEAL STRICTURE 11/09/2009   Qualifier: Diagnosis of  By: [name] (AAMA), [name]    . Essential hypertension 05/18/2006   Qualifier: Diagnosis of  By: [name]    . GERD 09/14/2009   Qualifier: Diagnosis of  By: [name]    . GERD (gastroesophageal reflux disease)   . HEADACHE 05/18/2006   Qualifier: Diagnosis of  By: [name]    .  Hirsutism 05/18/2006   Qualifier: Diagnosis of  By: [name]    . Hyperlipidemia   . Hyperlipidemia associated with type 2 diabetes mellitus ([place]) 04/15/2019  . Hypertension   . Kidney stones   . LAPAROSCOPY, HX OF 05/18/2006   Qualifier: Diagnosis of  By: [name]    . Nonspecific (abnormal) findings on radiological and other examination of biliary tract 03/01/2013  . Obesity (BMI 30-39.9) 10/18/2012  . Other chest pain 03/21/2020  . Palpitation 03/21/2020  . Palpitations   . POSTMENOPAUSAL STATUS 09/14/2009   Qualifier: Diagnosis of  By: [name]    . SKIN TAG 10/05/2009   Qualifier: Diagnosis of  By: [name]    . SOB  (shortness of breath)   . Uncontrolled type 2 diabetes mellitus with hyperglycemia ([place]) 04/15/2019  . Vitamin D deficiency   . Vitamin D insufficiency 05/21/2020    Past Surgical History:  Procedure Laterality Date  . ABDOMINAL HYSTERECTOMY  08/2004  . APPENDECTOMY    . AUGMENTATION MAMMAPLASTY Bilateral 2000  . BREAST ENHANCEMENT SURGERY  2001  . COLONOSCOPY    . LEFT HEART CATH AND CORONARY ANGIOGRAPHY N/A 04/09/2020   Procedure: LEFT HEART CATH AND CORONARY ANGIOGRAPHY;  Surgeon: Leonie Man, MD;  Location: [facility];  Service: Cardiovascular;  Laterality: N/A;  . POLYPECTOMY  2009   sigmoid polyps  . SHOULDER ARTHROSCOPY W/ ROTATOR CUFF REPAIR Right 06/06/2015   [facility]  . TONSILLECTOMY    . TUBAL LIGATION      Current Medications: Current Meds  Medication Sig  . aspirin 81 MG chewable tablet Chew 81 mg by mouth at bedtime.  [name] atenolol (TENORMIN) 25 MG tablet Take 25 mg by mouth daily.  [name] atorvastatin (LIPITOR) 40 MG tablet Take 40 mg by mouth daily.  . bromocriptine (PARLODEL) 2.5 MG tablet Take 0.5 tablets (1.25 mg total) by mouth daily.  . Calcium Carbonate-Vitamin D (OSCAL 500/200 D-3 PO) Take 1 tablet by mouth at bedtime.  [name] CRANBERRY-VITAMIN C PO Take 2 capsules by mouth at bedtime.  . fenofibrate 160 MG tablet Take 1 tablet (160 mg total) by mouth daily.  . Flaxseed, Linseed, (FLAXSEED OIL PO) Take 1,400 mg by mouth at bedtime.  . Ipratropium-Albuterol (COMBIVENT RESPIMAT) 20-100 MCG/ACT AERS respimat Inhale 1 puff into the lungs 4 (four) times daily as needed for wheezing or shortness of breath.  . mometasone (NASONEX) 50 MCG/ACT nasal spray Place 2 sprays into the nose daily.  . montelukast (SINGULAIR) 10 MG tablet Take 1 tablet (10 mg total) by mouth daily.  . nitroGLYCERIN (NITROSTAT) 0.4 MG SL tablet Place 1 tablet (0.4 mg total) under the tongue every 5 (five) minutes as needed for chest pain.  . pantoprazole (PROTONIX) 40 MG tablet Take 1 tablet (40  mg total) by mouth 2 (two) times daily.  [name] Propylene Glycol 0.6 % SOLN Apply 1 drop to eye 4 (four) times daily as needed for dry eyes (dry/irritated eyes).  . repaglinide (PRANDIN) 2 MG tablet Take 1 tablet (2 mg total) by mouth 2 (two) times daily before a meal.  . Semaglutide (RYBELSUS) 14 MG TABS Take 14 mg by mouth daily.  . Vitamin D, Ergocalciferol, (DRISDOL) 1.25 MG (50000 UNIT) CAPS capsule Take 1 capsule (50,000 Units total) by mouth every 7 (seven) days.     Allergies:   Contrast media [iodinated diagnostic agents], Metamucil [psyllium], and Mustard seed   Social History   Socioeconomic History  .  Marital status: Divorced    Spouse name: Not on file  . Number of children: 2  . Years of education: Not on file  . Highest education level: Not on file  Occupational History  . Occupation: Nurse  Tobacco Use  . Smoking status: Never Smoker  . Smokeless tobacco: Never Used  Substance and Sexual Activity  . Alcohol use: Yes    Comment: rare  . Drug use: No  . Sexual activity: Not Currently    Partners: Male  Other Topics Concern  . Not on file  Social History Narrative   Buyer, retail , and pt daily   Social Determinants of Health   Financial Resource Strain: Not on file  Food Insecurity: Not on file  Transportation Needs: Not on file  Physical Activity: Insufficiently Active  . Days of Exercise per Week: 3 days  . Minutes of Exercise per Session: 20 min  Stress: Not on file  Social Connections: Not on file     Family History: The patient's family history includes Asthma in an other family member; Breast cancer in her maternal grandmother and mother; Cervical cancer in her maternal grandmother; Colon cancer in her maternal grandmother; Colon polyps in her father; Coronary artery disease in an other family member; Diabetes in her father and mother; Heart disease in her father and mother; High Cholesterol in her father and mother; High blood pressure in her father  and mother; Hyperlipidemia in an other family member; Hypertension in an other family member; Irritable bowel syndrome in her mother; Kidney disease in her father; Obesity in her father and mother; Rheumatic fever in her mother; Stroke in her mother; Sudden death in her mother; Uterine cancer in her maternal grandmother. There is no history of Esophageal cancer, Rectal cancer, or Stomach cancer.  ROS:   Please see the history of present illness.    All other systems reviewed and are negative.  EKGs/Labs/Other Studies Reviewed:    The following studies were reviewed today: I discussed coronary angiography report with the patient at length   Recent Labs: 04/05/2020: Hemoglobin 14.5; Platelets 334 05/16/2020: ALT 36; BUN 21; Creatinine, Ser 0.93; Potassium 4.9; Sodium 141; TSH 2.060  Recent Lipid Panel    Component Value Date/Time   CHOL 158 05/16/2020 0923   TRIG 119 05/16/2020 0923   HDL 47 05/16/2020 0923   CHOLHDL 3 03/20/2020 1000   VLDL 20.6 03/20/2020 1000   LDLCALC 90 05/16/2020 0923   LDLCALC 113 (H) 10/21/2019 0912   LDLDIRECT 128.1 10/05/2013 0803    Physical Exam:    VS:  BP (!) 104/58   Pulse 70   Ht 5\' 1"  (1.549 m)   Wt 161 lb 1.3 oz (73.1 kg)   SpO2 97%   BMI 30.44 kg/m     Wt Readings from Last 3 Encounters:  06/22/20 161 lb 1.3 oz (73.1 kg)  06/08/20 163 lb 6.4 oz (74.1 kg)  05/30/20 161 lb (73 kg)     GEN: Patient is in no acute distress HEENT: Normal NECK: No JVD; No carotid bruits LYMPHATICS: No lymphadenopathy CARDIAC: Hear sounds regular, 2/6 systolic murmur at the apex. RESPIRATORY:  Clear to auscultation without rales, wheezing or rhonchi  ABDOMEN: Soft, non-tender, non-distended MUSCULOSKELETAL:  No edema; No deformity  SKIN: Warm and dry NEUROLOGIC:  Alert and oriented x 3 PSYCHIATRIC:  Normal affect   Signed, Jenean Lindau, MD  06/22/2020 8:29 AM    [facility]

## 2020-06-26 ENCOUNTER — Encounter (INDEPENDENT_AMBULATORY_CARE_PROVIDER_SITE_OTHER): Payer: Self-pay | Admitting: Bariatrics

## 2020-06-26 ENCOUNTER — Ambulatory Visit (INDEPENDENT_AMBULATORY_CARE_PROVIDER_SITE_OTHER): Payer: BC Managed Care – PPO | Admitting: Bariatrics

## 2020-06-26 ENCOUNTER — Other Ambulatory Visit: Payer: Self-pay

## 2020-06-26 VITALS — BP 125/68 | HR 66 | Temp 98.1°F | Ht 61.0 in | Wt 157.0 lb

## 2020-06-26 DIAGNOSIS — Z6831 Body mass index (BMI) 31.0-31.9, adult: Secondary | ICD-10-CM

## 2020-06-26 DIAGNOSIS — E7849 Other hyperlipidemia: Secondary | ICD-10-CM | POA: Diagnosis not present

## 2020-06-26 DIAGNOSIS — E669 Obesity, unspecified: Secondary | ICD-10-CM | POA: Diagnosis not present

## 2020-06-26 DIAGNOSIS — E559 Vitamin D deficiency, unspecified: Secondary | ICD-10-CM | POA: Diagnosis not present

## 2020-06-26 DIAGNOSIS — Z9189 Other specified personal risk factors, not elsewhere classified: Secondary | ICD-10-CM

## 2020-06-26 MED ORDER — VITAMIN D (ERGOCALCIFEROL) 1.25 MG (50000 UNIT) PO CAPS
50000.0000 [IU] | ORAL_CAPSULE | ORAL | 0 refills | Status: DC
Start: 2020-06-26 — End: 2020-07-25

## 2020-07-03 ENCOUNTER — Encounter (INDEPENDENT_AMBULATORY_CARE_PROVIDER_SITE_OTHER): Payer: Self-pay | Admitting: Bariatrics

## 2020-07-03 NOTE — Progress Notes (Signed)
Chief Complaint:   OBESITY Nicole Richard is here to discuss her progress with her obesity treatment plan along with follow-up of her obesity related diagnoses. Nicole Richard is on the Category 2 Plan and states she is following her eating plan approximately 90% of the time. Nicole Richard states she is walking for 30 minutes 2-3 times per week.  Today's visit was #: 3 Starting weight: 163 lbs Starting date: 05/16/2020 Today's weight: 157 lbs Today's date: 06/26/2020 Total lbs lost to date: 6 lbs Total lbs lost since last in-office visit: 4 lbs  Interim History: Nicole Richard is down an additional 4 pounds.  Subjective:   1. Other hyperlipidemia [name] has hyperlipidemia and has been trying to improve her cholesterol levels with intensive lifestyle modification including a low saturated fat diet, exercise and weight loss. She denies any chest pain, claudication or myalgias.  Taking fenofibrate and Lipitor.  No myalgias.  Lab Results  Component Value Date   ALT 36 (H) 05/16/2020   AST 27 05/16/2020   ALKPHOS 119 05/16/2020   BILITOT 0.7 05/16/2020   Lab Results  Component Value Date   CHOL 158 05/16/2020   HDL 47 05/16/2020   LDLCALC 90 05/16/2020   LDLDIRECT 128.1 10/05/2013   TRIG 119 05/16/2020   CHOLHDL 3 03/20/2020   2. Vitamin D deficiency Nicole Richard's Vitamin D level was 26.5 on 05/16/2020. She is currently taking prescription vitamin D 50,000 IU each week. She denies nausea, vomiting or muscle weakness.  3. At risk for heart disease Nicole Richard is at a higher than average risk for cardiovascular disease due to obesity and hyperlipidemia.   Assessment/Plan:   1. Other hyperlipidemia Cardiovascular risk and specific lipid/LDL goals reviewed.  We discussed several lifestyle modifications today and Jacquetta will continue to work on diet, exercise and weight loss efforts. Orders and follow up as documented in patient record.   Continue medications.  Continue to work on diet and exercise.    Counseling Intensive lifestyle modifications are the first line treatment for this issue. Dietary changes: Increase soluble fiber. Decrease simple carbohydrates. Exercise changes: Moderate to vigorous-intensity aerobic activity 150 minutes per week if tolerated. Lipid-lowering medications: see documented in medical record.  2. Vitamin D deficiency Low Vitamin D level contributes to fatigue and are associated with obesity, breast, and colon cancer. She agrees to continue to take prescription Vitamin D @50 ,000 IU every week and will follow-up for routine testing of Vitamin D, at least 2-3 times per year to avoid over-replacement.  - Refill Vitamin D, Ergocalciferol, (DRISDOL) 1.25 MG (50000 UNIT) CAPS capsule; Take 1 capsule (50,000 Units total) by mouth every 7 (seven) days.  Dispense: 4 capsule; Refill: 0  3. At risk for heart disease Nicole Richard was given approximately 15 minutes of coronary artery disease prevention counseling today. She is 65 y.o. female and has risk factors for heart disease including obesity. We discussed intensive lifestyle modifications today with an emphasis on specific weight loss instructions and strategies.   Repetitive spaced learning was employed today to elicit superior memory formation and behavioral change.  4. Obesity with current BMI of 29.8  Nicole Richard is currently in the action stage of change. As such, her goal is to continue with weight loss efforts. She has agreed to the Category 2 Plan.   She will work on meal planning, will adhere closely to the plan, and she will focus on drinking more water.  Exercise goals:  Will continue walking for 30 minutes.  Behavioral modification strategies: increasing lean protein  intake, decreasing simple carbohydrates, increasing vegetables, increasing water intake, meal planning and cooking strategies, better snacking choices, emotional eating strategies, and planning for success.  Nicole Richard has agreed to follow-up with our  clinic in 2 weeks. She was informed of the importance of frequent follow-up visits to maximize her success with intensive lifestyle modifications for her multiple health conditions.   Objective:   Blood pressure 125/68, pulse 66, temperature 98.1 F (36.7 C), height 5\' 1"  (1.549 m), weight 157 lb (71.2 kg), SpO2 96 %. Body mass index is 29.66 kg/m.  General: Cooperative, alert, well developed, in no acute distress. HEENT: Conjunctivae and lids unremarkable. Cardiovascular: Regular rhythm.  Lungs: Normal work of breathing. Neurologic: No focal deficits.   Lab Results  Component Value Date   CREATININE 0.93 05/16/2020   BUN 21 05/16/2020   NA 141 05/16/2020   K 4.9 05/16/2020   CL 101 05/16/2020   CO2 24 05/16/2020   Lab Results  Component Value Date   ALT 36 (H) 05/16/2020   AST 27 05/16/2020   ALKPHOS 119 05/16/2020   BILITOT 0.7 05/16/2020   Lab Results  Component Value Date   HGBA1C 6.8 (A) 06/08/2020   HGBA1C 7.9 (H) 05/16/2020   HGBA1C 7.3 (A) 03/09/2020   HGBA1C 8.4 (A) 12/28/2019   HGBA1C 8.1 (H) 10/21/2019   Lab Results  Component Value Date   INSULIN 46.9 (H) 05/16/2020   Lab Results  Component Value Date   TSH 2.060 05/16/2020   Lab Results  Component Value Date   CHOL 158 05/16/2020   HDL 47 05/16/2020   LDLCALC 90 05/16/2020   LDLDIRECT 128.1 10/05/2013   TRIG 119 05/16/2020   CHOLHDL 3 03/20/2020   Lab Results  Component Value Date   WBC 5.9 04/05/2020   HGB 14.5 04/05/2020   HCT 44.0 04/05/2020   MCV 90 04/05/2020   PLT 334 04/05/2020   Attestation Statements:   Reviewed by clinician on day of visit: allergies, medications, problem list, medical history, surgical history, family history, social history, and previous encounter notes.  I, Water quality scientist, CMA, am acting as Location manager for [employer], DO  I have reviewed the above documentation for accuracy and completeness, and I agree with the above. Nicole Lesch, DO

## 2020-07-24 ENCOUNTER — Ambulatory Visit: Payer: BC Managed Care – PPO | Admitting: Dermatology

## 2020-07-25 ENCOUNTER — Other Ambulatory Visit: Payer: Self-pay

## 2020-07-25 ENCOUNTER — Encounter (INDEPENDENT_AMBULATORY_CARE_PROVIDER_SITE_OTHER): Payer: Self-pay | Admitting: Bariatrics

## 2020-07-25 ENCOUNTER — Ambulatory Visit (INDEPENDENT_AMBULATORY_CARE_PROVIDER_SITE_OTHER): Payer: BC Managed Care – PPO | Admitting: Bariatrics

## 2020-07-25 VITALS — BP 112/75 | HR 74 | Temp 98.2°F | Ht 61.0 in | Wt 158.0 lb

## 2020-07-25 DIAGNOSIS — Z9189 Other specified personal risk factors, not elsewhere classified: Secondary | ICD-10-CM

## 2020-07-25 DIAGNOSIS — E559 Vitamin D deficiency, unspecified: Secondary | ICD-10-CM | POA: Diagnosis not present

## 2020-07-25 DIAGNOSIS — E669 Obesity, unspecified: Secondary | ICD-10-CM

## 2020-07-25 DIAGNOSIS — Z6831 Body mass index (BMI) 31.0-31.9, adult: Secondary | ICD-10-CM

## 2020-07-25 DIAGNOSIS — E1169 Type 2 diabetes mellitus with other specified complication: Secondary | ICD-10-CM

## 2020-07-25 MED ORDER — VITAMIN D (ERGOCALCIFEROL) 1.25 MG (50000 UNIT) PO CAPS: 50000.0000 [IU] | ORAL_CAPSULE | ORAL | 0 refills | Status: DC

## 2020-07-30 NOTE — Progress Notes (Signed)
Chief Complaint:   OBESITY Nicole Richard is here to discuss her progress with her obesity treatment plan along with follow-up of her obesity related diagnoses. [name] is on the Category 2 Plan and states she is following her eating plan approximately 75% of the time. [name] states she is walking 30 minutes 3 times per week.  Today's visit was #: 4 Starting weight: 163 lbs Starting date: 05/16/2020 Today's weight: 158 lbs Today's date: 07/25/2020 Total lbs lost to date: 5 lbs Total lbs lost since last in-office visit: 0  Interim History: [name] up 1 lb. She has celebrated multiple occassions.  She is working on her water intake.  Subjective:   1. Vitamin D deficiency [name] is taking Vitamin D as directed.  2. Diabetes mellitus type 2 in obese ([name]) [name] is taking Prandin and Rybelsus. Her fasting blood sugar level is 105-150, and she denies any lows.  3. At risk for hypoglycemia [name] has a history of some elevated blood glucose readings without a diagnosis of diabetes.  Assessment/Plan:   1. Vitamin D deficiency Low Vitamin D level contributes to fatigue and are associated with obesity, breast, and colon cancer. We will refill prescription Vitamin D for 1 month. [name] will follow-up for routine testing of Vitamin D, at least 2-3 times per year to avoid over-replacement.    - Vitamin D, Ergocalciferol, (DRISDOL) 1.25 MG (50000 UNIT) CAPS capsule; Take 1 capsule (50,000 Units total) by mouth every 7 (seven) days.  Dispense: 4 capsule; Refill: 0  2. Diabetes mellitus type 2 in obese [facility]) [name] will continue medications. Good blood sugar control is important to decrease the likelihood of diabetic complications such as nephropathy, neuropathy, limb loss, blindness, coronary artery disease, and death. Intensive lifestyle modification including diet, exercise and weight loss are the first line of treatment for diabetes.   3. At risk for hypoglycemia [name] was given approximately  15 minutes of counseling today regarding prevention of hypoglycemia. She was advised of symptoms of hypoglycemia. [name] was instructed to avoid skipping meals, eat regular protein rich meals and schedule low calorie snacks as needed. She will continue dietary and walking.  Repetitive spaced learning was employed today to elicit superior memory formation and behavioral change   4. Obesity with current BMI of 29.9 [name] is currently in the action stage of change. As such, her goal is to continue with weight loss efforts. She has agreed to the Category 2 Plan.   [name] will adhere closely to the plan.  She will continue meal planning and will to increase her H2O.  Exercise goals:  As is.  Behavioral modification strategies: increasing lean protein intake, decreasing simple carbohydrates, increasing vegetables, increasing water intake, ways to avoid boredom eating, ways to avoid night time snacking, better snacking choices, emotional eating strategies, and planning for success.  [name] has agreed to follow-up with our clinic in 2 weeks. She was informed of the importance of frequent follow-up visits to maximize her success with intensive lifestyle modifications for her multiple health conditions.   Objective:   Blood pressure 112/75, pulse 74, temperature 98.2 F (36.8 C), height 5\' 1"  (1.549 m), weight 158 lb (71.7 kg), SpO2 94 %. Body mass index is 29.85 kg/m.  General: Cooperative, alert, well developed, in no acute distress. HEENT: Conjunctivae and lids unremarkable. Cardiovascular: Regular rhythm.  Lungs: Normal work of breathing. Neurologic: No focal deficits.   Lab Results  Component Value Date   CREATININE 0.93 05/16/2020   BUN 21 05/16/2020  NA 141 05/16/2020   K 4.9 05/16/2020   CL 101 05/16/2020   CO2 24 05/16/2020   Lab Results  Component Value Date   ALT 36 (H) 05/16/2020   AST 27 05/16/2020   ALKPHOS 119 05/16/2020   BILITOT 0.7 05/16/2020   Lab Results   Component Value Date   HGBA1C 6.8 (A) 06/08/2020   HGBA1C 7.9 (H) 05/16/2020   HGBA1C 7.3 (A) 03/09/2020   HGBA1C 8.4 (A) 12/28/2019   HGBA1C 8.1 (H) 10/21/2019   Lab Results  Component Value Date   INSULIN 46.9 (H) 05/16/2020   Lab Results  Component Value Date   TSH 2.060 05/16/2020   Lab Results  Component Value Date   CHOL 158 05/16/2020   HDL 47 05/16/2020   LDLCALC 90 05/16/2020   LDLDIRECT 128.1 10/05/2013   TRIG 119 05/16/2020   CHOLHDL 3 03/20/2020   Lab Results  Component Value Date   VD25OH 26.5 (L) 05/16/2020   VD25OH 41 09/14/2009   Lab Results  Component Value Date   WBC 5.9 04/05/2020   HGB 14.5 04/05/2020   HCT 44.0 04/05/2020   MCV 90 04/05/2020   PLT 334 04/05/2020   No results found for: IRON, TIBC, FERRITIN  Attestation Statements:   Reviewed by clinician on day of visit: allergies, medications, problem list, medical history, surgical history, family history, social history, and previous encounter notes.  I, Lizbeth Bark, RMA, am acting as Location manager for [employer], DO.   I have reviewed the above documentation for accuracy and completeness, and I agree with the above. Jearld Lesch, DO

## 2020-08-01 ENCOUNTER — Encounter (INDEPENDENT_AMBULATORY_CARE_PROVIDER_SITE_OTHER): Payer: Self-pay | Admitting: Bariatrics

## 2020-08-08 ENCOUNTER — Other Ambulatory Visit: Payer: Self-pay | Admitting: Endocrinology

## 2020-08-08 ENCOUNTER — Other Ambulatory Visit: Payer: Self-pay | Admitting: Family Medicine

## 2020-08-08 DIAGNOSIS — E1169 Type 2 diabetes mellitus with other specified complication: Secondary | ICD-10-CM

## 2020-08-14 ENCOUNTER — Ambulatory Visit (INDEPENDENT_AMBULATORY_CARE_PROVIDER_SITE_OTHER): Payer: BC Managed Care – PPO | Admitting: Bariatrics

## 2020-08-14 ENCOUNTER — Other Ambulatory Visit: Payer: Self-pay

## 2020-08-14 ENCOUNTER — Encounter (INDEPENDENT_AMBULATORY_CARE_PROVIDER_SITE_OTHER): Payer: Self-pay | Admitting: Bariatrics

## 2020-08-14 VITALS — BP 131/84 | HR 82 | Temp 98.2°F | Ht 61.0 in | Wt 156.0 lb

## 2020-08-14 DIAGNOSIS — Z9189 Other specified personal risk factors, not elsewhere classified: Secondary | ICD-10-CM | POA: Diagnosis not present

## 2020-08-14 DIAGNOSIS — E1169 Type 2 diabetes mellitus with other specified complication: Secondary | ICD-10-CM

## 2020-08-14 DIAGNOSIS — E669 Obesity, unspecified: Secondary | ICD-10-CM | POA: Diagnosis not present

## 2020-08-14 DIAGNOSIS — Z6831 Body mass index (BMI) 31.0-31.9, adult: Secondary | ICD-10-CM | POA: Diagnosis not present

## 2020-08-14 DIAGNOSIS — E559 Vitamin D deficiency, unspecified: Secondary | ICD-10-CM

## 2020-08-14 MED ORDER — VITAMIN D (ERGOCALCIFEROL) 1.25 MG (50000 UNIT) PO CAPS: 50000.0000 [IU] | ORAL_CAPSULE | ORAL | 0 refills | Status: DC

## 2020-08-18 ENCOUNTER — Other Ambulatory Visit: Payer: Self-pay | Admitting: Family Medicine

## 2020-08-18 ENCOUNTER — Other Ambulatory Visit: Payer: Self-pay | Admitting: Endocrinology

## 2020-08-18 DIAGNOSIS — E1169 Type 2 diabetes mellitus with other specified complication: Secondary | ICD-10-CM

## 2020-08-18 DIAGNOSIS — E785 Hyperlipidemia, unspecified: Secondary | ICD-10-CM

## 2020-08-18 NOTE — Progress Notes (Signed)
Chief Complaint:   OBESITY Nicole Richard is here to discuss her progress with her obesity treatment plan along with follow-up of her obesity related diagnoses. Nicole Richard is on the Category 2 Plan and states she is following her eating plan approximately 25% of the time. Nicole Richard states she is not currently exercising.  Today's visit was #: 5 Starting weight: 163 lbs Starting date: 05/16/2020 Today's weight: 156 lbs Today's date: 08/14/2020 Total lbs lost to date: lbs Total lbs lost since last in-office visit: 2 lbs  Interim History: Nicole Richard is down an additional 2 lbs. She has been stressed and constipated. She is getting adequate protein  Subjective:   1. Vitamin D deficiency She is currently taking prescription vitamin D 50,000 IU each week. She denies nausea, vomiting or muscle weakness.  Lab Results  Component Value Date   VD25OH 26.5 (L) 05/16/2020   VD25OH 41 09/14/2009   2. Diabetes mellitus type 2 in obese ([place]) Nicole Richard is taking Prandin and Rybelsus.  Lab Results  Component Value Date   HGBA1C 6.8 (A) 06/08/2020   HGBA1C 7.9 (H) 05/16/2020   HGBA1C 7.3 (A) 03/09/2020   Lab Results  Component Value Date   MICROALBUR 0.6 10/21/2019   LDLCALC 90 05/16/2020   CREATININE 0.93 05/16/2020   Lab Results  Component Value Date   INSULIN 46.9 (H) 05/16/2020   3. At risk for osteoporosis Nicole Richard is at higher risk of osteopenia and osteoporosis due to Vitamin D deficiency.    Assessment/Plan:   1. Vitamin D deficiency Low Vitamin D level contributes to fatigue and are associated with obesity, breast, and colon cancer. She agrees to continue to take prescription Vitamin D '@50'$ ,000 IU every week and will follow-up for routine testing of Vitamin D, at least 2-3 times per year to avoid over-replacement.  - Vitamin D, Ergocalciferol, (DRISDOL) 1.25 MG (50000 UNIT) CAPS capsule; Take 1 capsule (50,000 Units total) by mouth every 7 (seven) days.  Dispense: 4 capsule; Refill: 0  2.  Diabetes mellitus type 2 in obese ([facility]) Good blood sugar control is important to decrease the likelihood of diabetic complications such as nephropathy, neuropathy, limb loss, blindness, coronary artery disease, and death. Intensive lifestyle modification including diet, exercise and weight loss are the first line of treatment for diabetes.  Nicole Richard will continue with her medication.  3. At risk for osteoporosis Nicole Richard was given approximately 15 minutes of osteoporosis prevention counseling today. Nicole Richard is at risk for osteopenia and osteoporosis due to her Vitamin D deficiency. She was encouraged to take her Vitamin D and follow her higher calcium diet and increase strengthening exercise to help strengthen her bones and decrease her risk of osteopenia and osteoporosis.  Repetitive spaced learning was employed today to elicit superior memory formation and behavioral change.   4. Obesity with current BMI of 29.6 Nicole Richard is currently in the action stage of change. As such, her goal is to continue with weight loss efforts. She has agreed to the Category 2 Plan.   Meal planning Intentional eating Increase water intake  Exercise goals: Increase exercise  Behavioral modification strategies: increasing lean protein intake, decreasing simple carbohydrates, increasing vegetables, increasing water intake, decreasing eating out, no skipping meals, meal planning and cooking strategies, keeping healthy foods in the home, and planning for success.  Nicole Richard has agreed to follow-up with our clinic in 2 weeks. She was informed of the importance of frequent follow-up visits to maximize her success with intensive lifestyle modifications for her multiple health conditions.  Objective:   Blood pressure 131/84, pulse 82, temperature 98.2 F (36.8 C), height '5\' 1"'$  (1.549 m), weight 156 lb (70.8 kg), SpO2 95 %. Body mass index is 29.48 kg/m.  General: Cooperative, alert, well developed, in no acute  distress. HEENT: Conjunctivae and lids unremarkable. Cardiovascular: Regular rhythm.  Lungs: Normal work of breathing. Neurologic: No focal deficits.   Lab Results  Component Value Date   CREATININE 0.93 05/16/2020   BUN 21 05/16/2020   NA 141 05/16/2020   K 4.9 05/16/2020   CL 101 05/16/2020   CO2 24 05/16/2020   Lab Results  Component Value Date   ALT 36 (H) 05/16/2020   AST 27 05/16/2020   ALKPHOS 119 05/16/2020   BILITOT 0.7 05/16/2020   Lab Results  Component Value Date   HGBA1C 6.8 (A) 06/08/2020   HGBA1C 7.9 (H) 05/16/2020   HGBA1C 7.3 (A) 03/09/2020   HGBA1C 8.4 (A) 12/28/2019   HGBA1C 8.1 (H) 10/21/2019   Lab Results  Component Value Date   INSULIN 46.9 (H) 05/16/2020   Lab Results  Component Value Date   TSH 2.060 05/16/2020   Lab Results  Component Value Date   CHOL 158 05/16/2020   HDL 47 05/16/2020   LDLCALC 90 05/16/2020   LDLDIRECT 128.1 10/05/2013   TRIG 119 05/16/2020   CHOLHDL 3 03/20/2020   Lab Results  Component Value Date   VD25OH 26.5 (L) 05/16/2020   VD25OH 41 09/14/2009   Lab Results  Component Value Date   WBC 5.9 04/05/2020   HGB 14.5 04/05/2020   HCT 44.0 04/05/2020   MCV 90 04/05/2020   PLT 334 04/05/2020   No results found for: IRON, TIBC, FERRITIN  Attestation Statements:   Reviewed by clinician on day of visit: allergies, medications, problem list, medical history, surgical history, family history, social history, and previous encounter notes.  ILennette Bihari, CMA, am acting as transcriptionist for Dr. Jearld Lesch, DO.  I have reviewed the above documentation for accuracy and completeness, and I agree with the above. Jearld Lesch, DO

## 2020-08-24 IMAGING — MG DIGITAL SCREENING BILATERAL MAMMOGRAM WITH IMPLANTS, CAD AND TOM
8 of 12 series · 8 of 28 positions shown · non-contrast
Comparison: Previous exam(s).

CLINICAL DATA: Screening.

EXAM:
DIGITAL SCREENING BILATERAL MAMMOGRAM WITH IMPLANTS, CAD AND TOMO
The patient has retropectoral implants. Standard and implant
displaced views were performed.

[L CC]
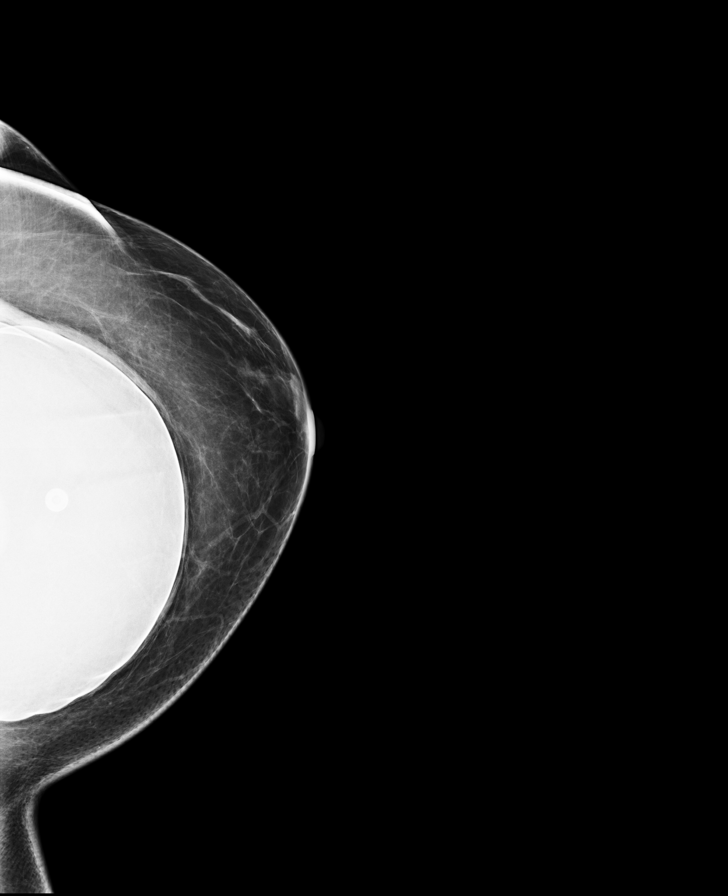

[R MLO]
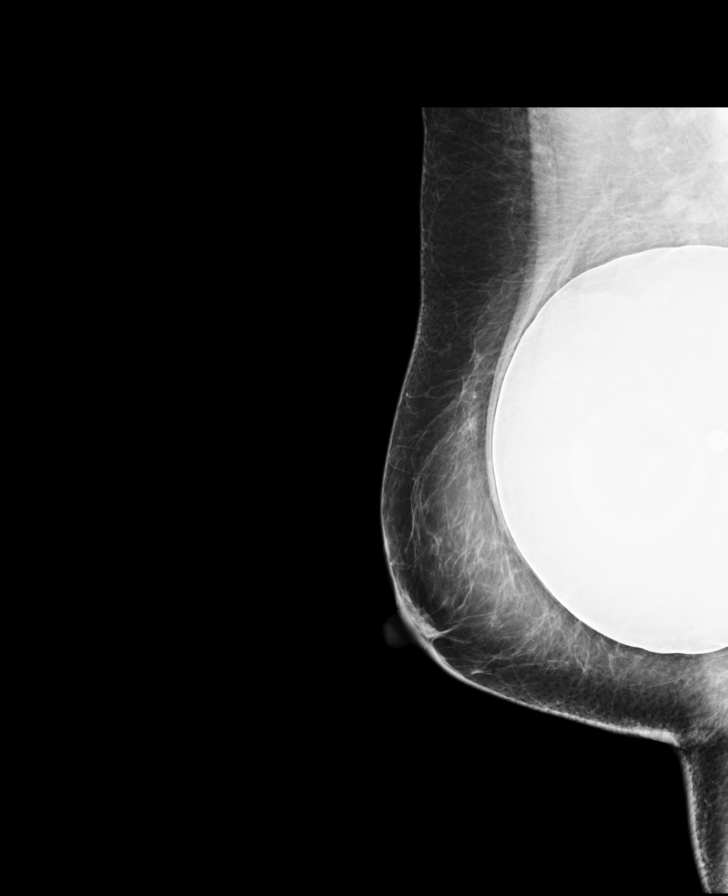

[L MLO]
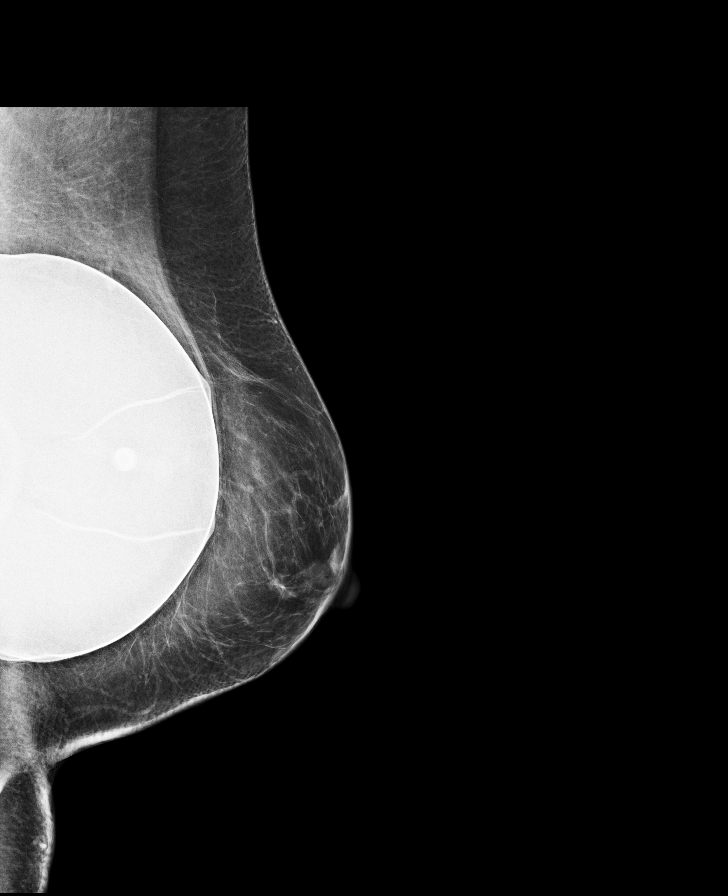

[R CC]
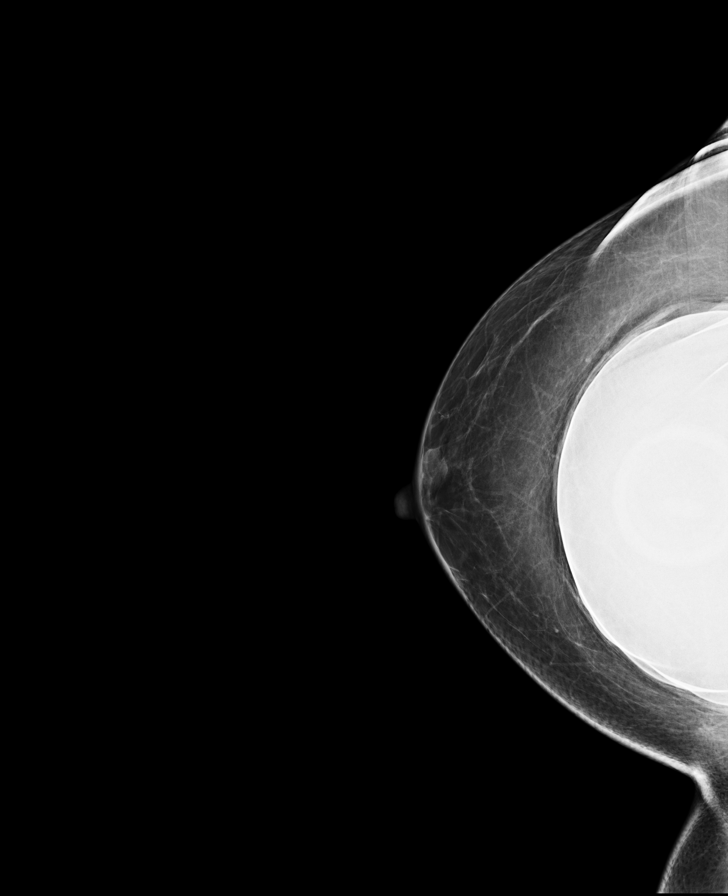

[R CC synth-2D]
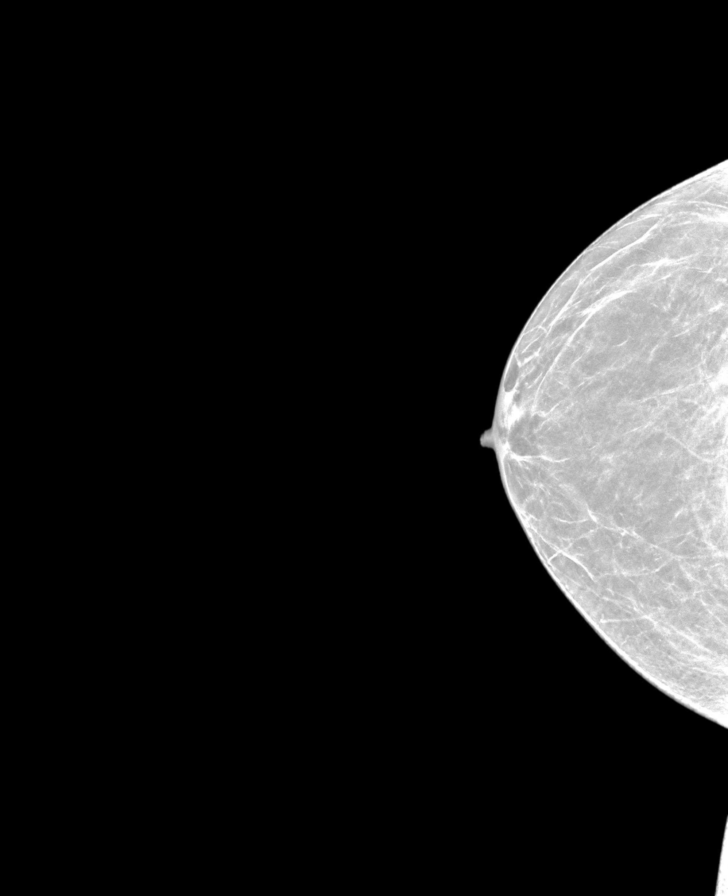

[R MLO synth-2D]
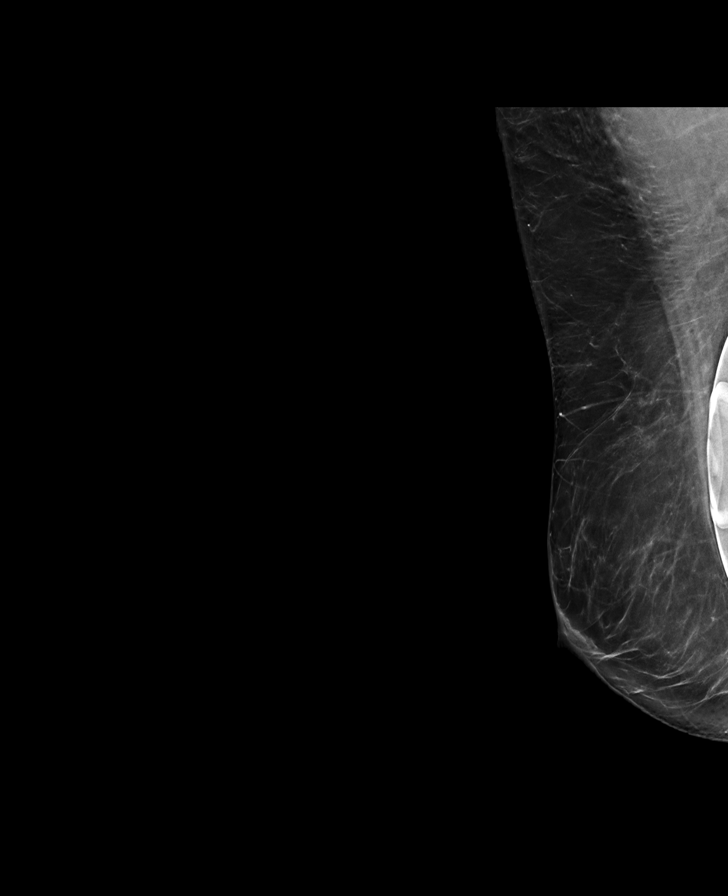

[L MLO synth-2D]
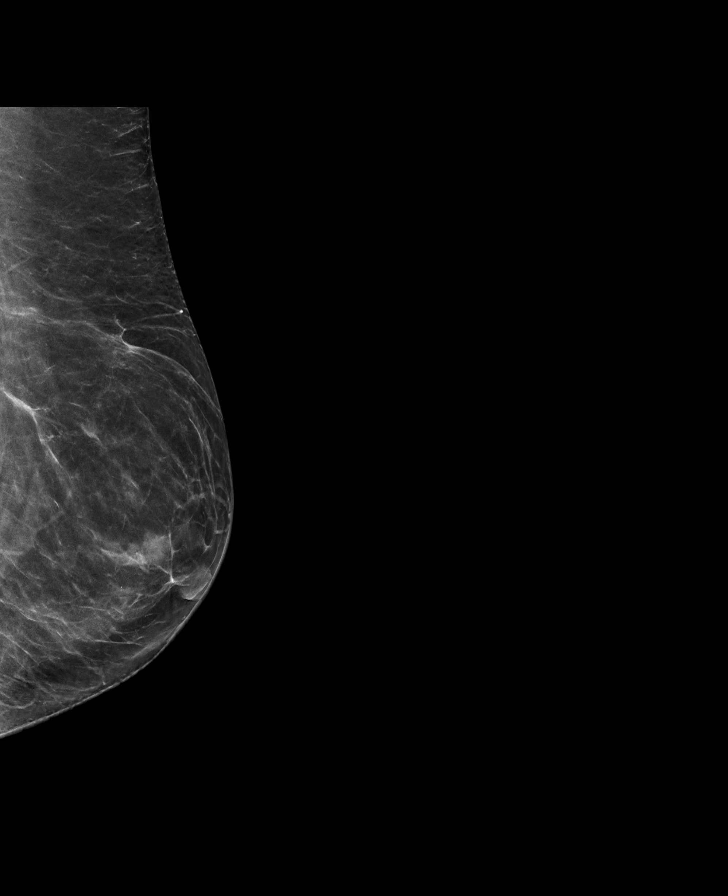

[L CC synth-2D]
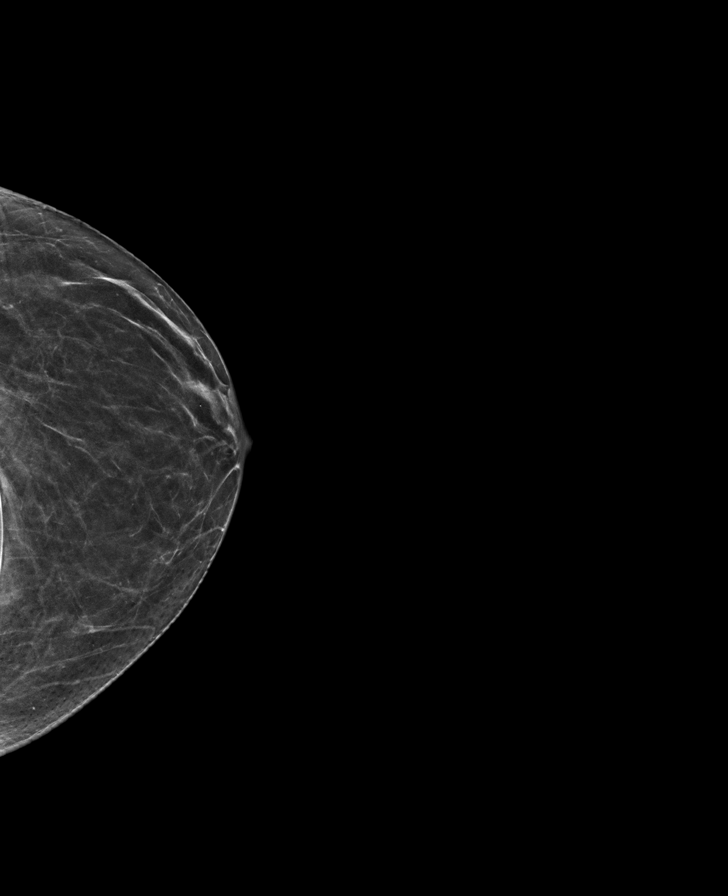

[8 of 28 positions shown; findings below may reference images not displayed]

ACR Breast Density Category b: There are scattered areas of
fibroglandular density.
FINDINGS: There are no findings suspicious for malignancy. Images were
processed with CAD.
IMPRESSION: No mammographic evidence of malignancy. A result letter of this
screening mammogram will be mailed directly to the patient.

RECOMMENDATION:
Screening mammogram in one year. (Code:60-T-8Z4)

BI-RADS CATEGORY  1:  Negative.

## 2020-08-31 ENCOUNTER — Other Ambulatory Visit: Payer: Self-pay | Admitting: Endocrinology

## 2020-09-02 ENCOUNTER — Encounter: Payer: Self-pay | Admitting: Endocrinology

## 2020-09-04 ENCOUNTER — Ambulatory Visit (INDEPENDENT_AMBULATORY_CARE_PROVIDER_SITE_OTHER): Payer: BC Managed Care – PPO | Admitting: Bariatrics

## 2020-09-04 ENCOUNTER — Encounter (INDEPENDENT_AMBULATORY_CARE_PROVIDER_SITE_OTHER): Payer: Self-pay | Admitting: Bariatrics

## 2020-09-04 ENCOUNTER — Other Ambulatory Visit: Payer: Self-pay

## 2020-09-04 VITALS — BP 133/85 | HR 75 | Temp 98.0°F | Ht 61.0 in | Wt 158.0 lb

## 2020-09-04 DIAGNOSIS — E559 Vitamin D deficiency, unspecified: Secondary | ICD-10-CM

## 2020-09-04 DIAGNOSIS — E669 Obesity, unspecified: Secondary | ICD-10-CM

## 2020-09-04 DIAGNOSIS — Z6831 Body mass index (BMI) 31.0-31.9, adult: Secondary | ICD-10-CM | POA: Diagnosis not present

## 2020-09-04 DIAGNOSIS — E1169 Type 2 diabetes mellitus with other specified complication: Secondary | ICD-10-CM

## 2020-09-04 DIAGNOSIS — Z9189 Other specified personal risk factors, not elsewhere classified: Secondary | ICD-10-CM | POA: Diagnosis not present

## 2020-09-04 MED ORDER — VITAMIN D (ERGOCALCIFEROL) 1.25 MG (50000 UNIT) PO CAPS: 50000.0000 [IU] | ORAL_CAPSULE | ORAL | 0 refills | Status: DC

## 2020-09-05 NOTE — Progress Notes (Signed)
Chief Complaint:   OBESITY Nicole Richard is here to discuss her progress with her obesity treatment plan along with follow-up of her obesity related diagnoses. Nicole Richard is on the Category 2 Plan and states she is following her eating plan approximately 95% of the time. Nicole Richard states she is doing 0 minutes 0 times per week.  Today's visit was #: 6 Starting weight: 163 lbs Starting date: 05/16/2020 Today's weight: 158 lbs Today's date:09/04/2020 Total lbs lost to date: 5 lbs Total lbs lost since last in-office visit: 0  Interim History: Nicole Richard is up 2 lbs since her last visit. She followed the plan well. She is not drinking enough water.  Subjective:   1. Vitamin D deficiency She is currently taking prescription vitamin D 50,000 IU each week. She denies nausea, vomiting or muscle weakness.  2. Diabetes mellitus type 2 in obese ([place]) Yamilette is currently taking Prandin and Rybelsus. Her diabetes is controlled. She has some hunger.  3. At risk for osteoporosis Nicole Richard is at risk for osteoporosis due to Vitamin D deficiency.  Assessment/Plan:   1. Vitamin D deficiency Low Vitamin D level contributes to fatigue and are associated with obesity, breast, and colon cancer. We will refill prescription Vitamin D 50,000 IU every week for 1 month with no refills and Adaliz will follow-up for routine testing of Vitamin D, at least 2-3 times per year to avoid over-replacement.  - Vitamin D, Ergocalciferol, (DRISDOL) 1.25 MG (50000 UNIT) CAPS capsule; Take 1 capsule (50,000 Units total) by mouth every 7 (seven) days.  Dispense: 4 capsule; Refill: 0  2. Diabetes mellitus type 2 in obese [facility]) Dennis will continue to decrease carbohydrates. Good blood sugar control is important to decrease the likelihood of diabetic complications such as nephropathy, neuropathy, limb loss, blindness, coronary artery disease, and death. Intensive lifestyle modification including diet, exercise and weight loss are the first  line of treatment for diabetes.    3. At risk for osteoporosis Nicole Richard was given approximately 15 minutes of osteoporosis prevention counseling today. Nicole Richard is at risk for osteopenia and osteoporosis due to her Vitamin D deficiency. She was encouraged to take her Vitamin D and follow her higher calcium diet and increase strengthening exercise to help strengthen her bones and decrease her risk of osteopenia and osteoporosis.  Repetitive spaced learning was employed today to elicit superior memory formation and behavioral change.   4. Obesity with current BMI of 29.9 Nicole Richard is currently in the action stage of change. As such, her goal is to continue with weight loss efforts. She has agreed to the Category 2 Plan.   Nicole Richard will continue to adhere closely to the plan. She will continue meal planning. She will increase H2O. Recipes.  Exercise goals: No exercise has been prescribed at this time.  Behavioral modification strategies: increasing lean protein intake, decreasing simple carbohydrates, increasing vegetables, increasing water intake, decreasing eating out, no skipping meals, meal planning and cooking strategies, keeping healthy foods in the home, and planning for success.  Nicole Richard has agreed to follow-up with our clinic in 2 weeks. She was informed of the importance of frequent follow-up visits to maximize her success with intensive lifestyle modifications for her multiple health conditions.   Objective:   Blood pressure 133/85, pulse 75, temperature 98 F (36.7 C), height '5\' 1"'$  (1.549 m), weight 158 lb (71.7 kg), SpO2 97 %. Body mass index is 29.85 kg/m.  General: Cooperative, alert, well developed, in no acute distress. HEENT: Conjunctivae and lids unremarkable. Cardiovascular:  Regular rhythm.  Lungs: Normal work of breathing. Neurologic: No focal deficits.   Lab Results  Component Value Date   CREATININE 0.93 05/16/2020   BUN 21 05/16/2020   NA 141 05/16/2020   K 4.9  05/16/2020   CL 101 05/16/2020   CO2 24 05/16/2020   Lab Results  Component Value Date   ALT 36 (H) 05/16/2020   AST 27 05/16/2020   ALKPHOS 119 05/16/2020   BILITOT 0.7 05/16/2020   Lab Results  Component Value Date   HGBA1C 6.8 (A) 06/08/2020   HGBA1C 7.9 (H) 05/16/2020   HGBA1C 7.3 (A) 03/09/2020   HGBA1C 8.4 (A) 12/28/2019   HGBA1C 8.1 (H) 10/21/2019   Lab Results  Component Value Date   INSULIN 46.9 (H) 05/16/2020   Lab Results  Component Value Date   TSH 2.060 05/16/2020   Lab Results  Component Value Date   CHOL 158 05/16/2020   HDL 47 05/16/2020   LDLCALC 90 05/16/2020   LDLDIRECT 128.1 10/05/2013   TRIG 119 05/16/2020   CHOLHDL 3 03/20/2020   Lab Results  Component Value Date   VD25OH 26.5 (L) 05/16/2020   VD25OH 41 09/14/2009   Lab Results  Component Value Date   WBC 5.9 04/05/2020   HGB 14.5 04/05/2020   HCT 44.0 04/05/2020   MCV 90 04/05/2020   PLT 334 04/05/2020   No results found for: IRON, TIBC, FERRITIN  Attestation Statements:   Reviewed by clinician on day of visit: allergies, medications, problem list, medical history, surgical history, family history, social history, and previous encounter notes.  I, Lizbeth Bark, RMA, am acting as Location manager for [employer], DO.   I have reviewed the above documentation for accuracy and completeness, and I agree with the above. Jearld Lesch, DO

## 2020-09-06 ENCOUNTER — Encounter (INDEPENDENT_AMBULATORY_CARE_PROVIDER_SITE_OTHER): Payer: Self-pay | Admitting: Bariatrics

## 2020-09-13 ENCOUNTER — Ambulatory Visit: Payer: BC Managed Care – PPO | Admitting: Endocrinology

## 2020-09-13 ENCOUNTER — Other Ambulatory Visit: Payer: Self-pay | Admitting: Endocrinology

## 2020-09-13 ENCOUNTER — Other Ambulatory Visit: Payer: Self-pay

## 2020-09-13 VITALS — BP 152/86 | HR 80 | Ht 61.0 in | Wt 164.0 lb

## 2020-09-13 DIAGNOSIS — E119 Type 2 diabetes mellitus without complications: Secondary | ICD-10-CM | POA: Diagnosis not present

## 2020-09-13 LAB — POCT GLYCOSYLATED HEMOGLOBIN (HGB A1C): Hemoglobin A1C: 6.3 % — AB (ref 4.0–5.6)

## 2020-09-13 MED ORDER — TIRZEPATIDE 10 MG/0.5ML ~~LOC~~ SOAJ: 10.0000 mg | SUBCUTANEOUS | 3 refills | Status: DC

## 2020-09-13 NOTE — Progress Notes (Signed)
Subjective:    Patient ID: Nicole Richard, female    DOB: [birth year], 65 y.o.   MRN: [id number]  HPI Pt returns for f/u of diabetes mellitus:  DM type: 2 Dx'ed: 123456 Complications: CAD Therapy: 3 oral meds.  GDM: never DKA: never Severe hypoglycemia: never.  Pancreatitis: never.  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  Interval history: She takes meds as rx'ed.  pt states she feels well in general.  pt says glucose varies from 130-190.    Past Medical History:  Diagnosis Date   Abdominal pain, left upper quadrant 03/01/2013   Abnormal liver function tests 03/01/2013   Allergy    seasonal   Angina at [facility]) 03/21/2020   Angina pectoris ([place]) 03/21/2020   Asthma    Back pain    BREAST IMPLANTS, BILATERAL, HX OF 07/28/2007   Qualifier: Diagnosis of  By: [name]     CYSTOCELE WITH INCOMPLETE UTERINE PROLAPSE 05/18/2006   Qualifier: Diagnosis of  By: [name]     Diabetes ([place]) 01/29/2014   Diabetes mellitus without complication ([place])    Edema of both lower extremities    ESOPHAGEAL STRICTURE 11/09/2009   Qualifier: Diagnosis of  By: [name] ([place]), [name]     Essential hypertension 05/18/2006   Qualifier: Diagnosis of  By: [name]     GERD 09/14/2009   Qualifier: Diagnosis of  By: [name]     GERD (gastroesophageal reflux disease)    HEADACHE 05/18/2006   Qualifier: Diagnosis of  By: [name]     Hirsutism 05/18/2006   Qualifier: Diagnosis of  By: [name]     Hyperlipidemia    Hyperlipidemia associated with type 2 diabetes mellitus ([place]) 04/15/2019   Hypertension    Kidney stones    LAPAROSCOPY, HX OF 05/18/2006   Qualifier: Diagnosis of  By: [name]     Nonspecific (abnormal) findings on radiological and other examination of biliary tract 03/01/2013   Obesity (BMI 30-39.9) 10/18/2012   Other chest pain 03/21/2020    Palpitation 03/21/2020   Palpitations    POSTMENOPAUSAL STATUS 09/14/2009   Qualifier: Diagnosis of  By: [name]     SKIN TAG 10/05/2009   Qualifier: Diagnosis of  By: [name]     SOB (shortness of breath)    Uncontrolled type 2 diabetes mellitus with hyperglycemia ([place]) 04/15/2019   Vitamin D deficiency    Vitamin D insufficiency 05/21/2020    Past Surgical History:  Procedure Laterality Date   ABDOMINAL HYSTERECTOMY  08/2004   APPENDECTOMY     AUGMENTATION MAMMAPLASTY Bilateral 2000   BREAST ENHANCEMENT SURGERY  2001   COLONOSCOPY     LEFT HEART CATH AND CORONARY ANGIOGRAPHY N/A 04/09/2020   Procedure: LEFT HEART CATH AND CORONARY ANGIOGRAPHY;  Surgeon: Leonie Man, MD;  Location: [place] [facility];  Service: Cardiovascular;  Laterality: N/A;   POLYPECTOMY  2009   sigmoid polyps   SHOULDER ARTHROSCOPY W/ ROTATOR CUFF REPAIR Right 06/06/2015   [facility]   TONSILLECTOMY     TUBAL LIGATION      Social History   Socioeconomic History   Marital status: Divorced    Spouse name: Not on file   Number of children: 2   Years of education: Not on file   Highest education level: Not on file  Occupational History   Occupation: Nurse  Tobacco Use   Smoking status: Never   Smokeless tobacco: Never  Substance and Sexual Activity   Alcohol use: Yes    Comment: rare   Drug use: No   Sexual activity: Not Currently    Partners: Male  Other Topics Concern   Not on file  Social History Narrative   Buyer, retail , and pt daily   Social Determinants of Health   Financial Resource Strain: Not on file  Food Insecurity: Not on file  Transportation Needs: Not on file  Physical Activity: Insufficiently Active   Days of Exercise per Week: 3 days   Minutes of Exercise per Session: 20 min  Stress: Not on file  Social Connections: Not on file  Intimate Partner Violence: Not on file    Current Outpatient Medications on File Prior to Visit  Medication Sig Dispense  Refill   aspirin 81 MG chewable tablet Chew 81 mg by mouth at bedtime.     atenolol (TENORMIN) 25 MG tablet Take 25 mg by mouth daily.     atorvastatin (LIPITOR) 40 MG tablet TAKE 1 TABLET BY MOUTH EVERYDAY AT BEDTIME 90 tablet 1   bromocriptine (PARLODEL) 2.5 MG tablet Take 0.5 tablets (1.25 mg total) by mouth daily. 45 tablet 3   Calcium Carbonate-Vitamin D (OSCAL 500/200 D-3 PO) Take 1 tablet by mouth at bedtime.     CRANBERRY-VITAMIN C PO Take 2 capsules by mouth at bedtime.     fenofibrate 160 MG tablet TAKE 1 TABLET BY MOUTH EVERY DAY 90 tablet 1   Flaxseed, Linseed, (FLAXSEED OIL PO) Take 1,400 mg by mouth at bedtime.     Ipratropium-Albuterol (COMBIVENT RESPIMAT) 20-100 MCG/ACT AERS respimat Inhale 1 puff into the lungs 4 (four) times daily as needed for wheezing or shortness of breath. 4 g 5   mometasone (NASONEX) 50 MCG/ACT nasal spray Place 2 sprays into the nose daily.     montelukast (SINGULAIR) 10 MG tablet Take 1 tablet (10 mg total) by mouth daily. 90 tablet 3   pantoprazole (PROTONIX) 40 MG tablet Take 1 tablet (40 mg total) by mouth 2 (two) times daily. 180 tablet 3   Propylene Glycol 0.6 % SOLN Apply 1 drop to eye 4 (four) times daily as needed for dry eyes (dry/irritated eyes).     repaglinide (PRANDIN) 2 MG tablet Take 1 tablet (2 mg total) by mouth 2 (two) times daily before a meal. 180 tablet 3   Semaglutide (RYBELSUS) 14 MG TABS Take 14 mg by mouth daily. 90 tablet 3   Vitamin D, Ergocalciferol, (DRISDOL) 1.25 MG (50000 UNIT) CAPS capsule Take 1 capsule (50,000 Units total) by mouth every 7 (seven) days. 4 capsule 0   nitroGLYCERIN (NITROSTAT) 0.4 MG SL tablet Place 1 tablet (0.4 mg total) under the tongue every 5 (five) minutes as needed for chest pain. 25 tablet 3   No current facility-administered medications on file prior to visit.    Allergies  Allergen Reactions   Contrast Media [Iodinated Diagnostic Agents] Anaphylaxis   Metamucil [Psyllium] Anaphylaxis    Mustard Seed Anaphylaxis    Family History  Problem Relation Age of Onset   Rheumatic fever Mother    Irritable bowel syndrome Mother    Breast cancer Mother    Diabetes Mother    High blood pressure Mother    High Cholesterol Mother    Heart disease Mother    Stroke Mother    Sudden death Mother  Obesity Mother    Diabetes Father    Colon polyps Father    Kidney disease Father    High blood pressure Father    High Cholesterol Father    Heart disease Father    Obesity Father    Coronary artery disease Other    Hyperlipidemia Other    Hypertension Other    Asthma Other    Cervical cancer Maternal Grandmother    Breast cancer Maternal Grandmother    Colon cancer Maternal Grandmother    Uterine cancer Maternal Grandmother    Esophageal cancer Neg Hx    Rectal cancer Neg Hx    Stomach cancer Neg Hx     BP (!) 152/86 (BP Location: Right Arm, Patient Position: Sitting, Cuff Size: Normal)   Pulse 80   Ht '5\' 1"'$  (1.549 m)   Wt 164 lb (74.4 kg)   SpO2 96%   BMI 30.99 kg/m    Review of Systems She denies hypoglycemia.      Objective:   Physical Exam Pulses: dorsalis pedis intact bilat.   MSK: no deformity of the feet CV: trace bilat leg edema.   Skin:  no ulcer on the feet.  normal color and temp on the feet.  Neuro: sensation is intact to touch on the feet, but decreased from normal.   Ext: there is bilateral onychomycosis of the toenails.    Lab Results  Component Value Date   CREATININE 0.93 05/16/2020   BUN 21 05/16/2020   NA 141 05/16/2020   K 4.9 05/16/2020   CL 101 05/16/2020   CO2 24 05/16/2020   A1c=6.3%    Assessment & Plan:  Type 2 DM Obesity: uncontrolled.   Patient Instructions  I have sent a prescription to your pharmacy, to change Rybelsus to [facility]. If the insurance does not pay for this, let's go back to what you are on now. If the insurance does pay, please start cutting the repaglinide in 1/2, to be safe. check your blood sugar  once a day.  vary the time of day when you check, between before the 3 meals, and at bedtime.  also check if you have symptoms of your blood sugar being too high or too low.  please keep a record of the readings and bring it to your next appointment here (or you can bring the meter itself).  You can write it on any piece of paper.  please call us sooner if your blood sugar goes below 70, or if most of your readings are over 200. Please come back for a follow-up appointment in 3 months.

## 2020-09-13 NOTE — Patient Instructions (Addendum)
I have sent a prescription to your pharmacy, to change Rybelsus to [facility]. If the insurance does not pay for this, let's go back to what you are on now. If the insurance does pay, please start cutting the repaglinide in 1/2, to be safe. check your blood sugar once a day.  vary the time of day when you check, between before the 3 meals, and at bedtime.  also check if you have symptoms of your blood sugar being too high or too low.  please keep a record of the readings and bring it to your next appointment here (or you can bring the meter itself).  You can write it on any piece of paper.  please call us sooner if your blood sugar goes below 70, or if most of your readings are over 200. Please come back for a follow-up appointment in 3 months.

## 2020-09-17 ENCOUNTER — Other Ambulatory Visit: Payer: Self-pay | Admitting: Family Medicine

## 2020-09-17 DIAGNOSIS — J302 Other seasonal allergic rhinitis: Secondary | ICD-10-CM

## 2020-09-19 ENCOUNTER — Ambulatory Visit
Admission: RE | Admit: 2020-09-19 | Discharge: 2020-09-19 | Disposition: A | Discharge status: Home / Self Care | Payer: BC Managed Care – PPO | Source: Ambulatory Visit | Attending: Family Medicine | Admitting: Family Medicine

## 2020-09-19 ENCOUNTER — Other Ambulatory Visit: Payer: Self-pay

## 2020-09-19 DIAGNOSIS — Z1231 Encounter for screening mammogram for malignant neoplasm of breast: Secondary | ICD-10-CM

## 2020-09-20 ENCOUNTER — Other Ambulatory Visit: Payer: Self-pay | Admitting: Endocrinology

## 2020-09-26 ENCOUNTER — Encounter: Payer: Self-pay | Admitting: Dermatology

## 2020-09-26 ENCOUNTER — Ambulatory Visit: Payer: BC Managed Care – PPO | Admitting: Dermatology

## 2020-09-26 ENCOUNTER — Other Ambulatory Visit: Payer: Self-pay

## 2020-09-26 DIAGNOSIS — L821 Other seborrheic keratosis: Secondary | ICD-10-CM | POA: Diagnosis not present

## 2020-09-26 DIAGNOSIS — D2372 Other benign neoplasm of skin of left lower limb, including hip: Secondary | ICD-10-CM | POA: Diagnosis not present

## 2020-09-26 DIAGNOSIS — Z1283 Encounter for screening for malignant neoplasm of skin: Secondary | ICD-10-CM

## 2020-09-26 DIAGNOSIS — D239 Other benign neoplasm of skin, unspecified: Secondary | ICD-10-CM

## 2020-10-03 ENCOUNTER — Ambulatory Visit (INDEPENDENT_AMBULATORY_CARE_PROVIDER_SITE_OTHER): Payer: BC Managed Care – PPO | Admitting: Bariatrics

## 2020-10-03 ENCOUNTER — Other Ambulatory Visit: Payer: Self-pay

## 2020-10-03 ENCOUNTER — Encounter (INDEPENDENT_AMBULATORY_CARE_PROVIDER_SITE_OTHER): Payer: Self-pay | Admitting: Bariatrics

## 2020-10-03 VITALS — BP 120/78 | HR 63 | Temp 98.2°F | Ht 61.0 in | Wt 160.0 lb

## 2020-10-03 DIAGNOSIS — E669 Obesity, unspecified: Secondary | ICD-10-CM | POA: Diagnosis not present

## 2020-10-03 DIAGNOSIS — E7849 Other hyperlipidemia: Secondary | ICD-10-CM

## 2020-10-03 DIAGNOSIS — E1169 Type 2 diabetes mellitus with other specified complication: Secondary | ICD-10-CM | POA: Diagnosis not present

## 2020-10-03 DIAGNOSIS — E559 Vitamin D deficiency, unspecified: Secondary | ICD-10-CM | POA: Diagnosis not present

## 2020-10-03 DIAGNOSIS — Z6831 Body mass index (BMI) 31.0-31.9, adult: Secondary | ICD-10-CM

## 2020-10-03 DIAGNOSIS — Z9189 Other specified personal risk factors, not elsewhere classified: Secondary | ICD-10-CM

## 2020-10-03 MED ORDER — VITAMIN D (ERGOCALCIFEROL) 1.25 MG (50000 UNIT) PO CAPS: 50000.0000 [IU] | ORAL_CAPSULE | ORAL | 0 refills | Status: DC

## 2020-10-03 NOTE — Progress Notes (Signed)
Chief Complaint:   OBESITY Nicole Richard is here to discuss her progress with her obesity treatment plan along with follow-up of her obesity related diagnoses. Nicole Richard is on the Category 2 Plan and states she is following her eating plan approximately 100% of the time. Nicole Richard states she is walking for 20 minutes 5 times per week.  Today's visit was #: 7 Starting weight: 163 lbs Starting date: 05/13/2020 Today's weight: 160 lbs Today's date: 10/03/2020 Total lbs lost to date: 3 lbs Total lbs lost since last in-office visit: 0  Interim History: Nicole Richard is up 2 lbs from her last visit. She is actually up 2 lbs of water weight per the bioimpedance scale. She is drinking extra water.   Subjective:   1. Vitamin D deficiency Nicole Richard is taking her medication as directed.  2. Other hyperlipidemia Nicole Richard is currently taking Lipitor.  3. Diabetes mellitus type 2 in obese Nicole Richard) Nicole Richard has been relatively well controlled.   4. At risk for heart disease Nicole Richard is at risk for heart disease due to obesity, diabetes mellitus and hyperlipidemia.  Assessment/Plan:   1. Vitamin D deficiency Low Vitamin D level contributes to fatigue and are associated with obesity, breast, and colon cancer. We will refill prescription Vitamin D 50,000 IU every week for 1 month with no refills and Nicole Richard will follow-up for routine testing of Vitamin D, at least 2-3 times per year to avoid over-replacement.  - Vitamin D, Ergocalciferol, (DRISDOL) 1.25 MG (50000 UNIT) CAPS capsule; Take 1 capsule (50,000 Units total) by mouth every 7 (seven) days.  Dispense: 4 capsule; Refill: 0  2. Other hyperlipidemia Cardiovascular risk and specific lipid/LDL goals reviewed.  We discussed several lifestyle modifications today and Nicole Richard will continue to work on diet, exercise and weight loss efforts. Nicole Richard will continue medications. Orders and follow up as documented in patient record.   Counseling Intensive lifestyle modifications  are the first line treatment for this issue. Dietary changes: Increase soluble fiber. Decrease simple carbohydrates. Exercise changes: Moderate to vigorous-intensity aerobic activity 150 minutes per week if tolerated. Lipid-lowering medications: see documented in medical record.  3. Diabetes mellitus type 2 in obese (Nicole Richard) A coupon was given to Nicole Richard today, start at 2.5 mg weekly. The prescription on chart was per another doctor. Good blood sugar control is important to decrease the likelihood of diabetic complications such as nephropathy, neuropathy, limb loss, blindness, coronary artery disease, and death. Intensive lifestyle modification including diet, exercise and weight loss are the first line of treatment for diabetes.    4. At risk for heart disease Nicole Richard was given approximately 15 minutes of coronary artery disease prevention counseling today. She is 65 y.o. female and has risk factors for heart disease including obesity. We discussed intensive lifestyle modifications today with an emphasis on specific weight loss instructions and strategies.   Repetitive spaced learning was employed today to elicit superior memory formation and behavioral change.   5. Obesity with current BMI of 30.3 Nicole Richard is currently in the action stage of change. As such, her goal is to continue with weight loss efforts. She has agreed to the Category 2 Plan.   Nicole Richard will continue meal planning and intentional eating.   Exercise goals:  As is.  Behavioral modification strategies: increasing lean protein intake, decreasing simple carbohydrates, increasing vegetables, increasing water intake, decreasing eating out, no skipping meals, meal planning and cooking strategies, keeping healthy foods in the home, and planning for success.  Nicole Richard has agreed to follow-up with  our clinic in 2-3 weeks. She was informed of the importance of frequent follow-up visits to maximize her success with intensive lifestyle  modifications for her multiple health conditions.   Objective:   Blood pressure 120/78, pulse 63, temperature 98.2 F (36.8 C), height '5\' 1"'$  (1.549 m), weight 160 lb (72.6 kg), SpO2 96 %. Body mass index is 30.23 kg/m.  General: Cooperative, alert, well developed, in no acute distress. HEENT: Conjunctivae and lids unremarkable. Cardiovascular: Regular rhythm.  Lungs: Normal work of breathing. Neurologic: No focal deficits.   Lab Results  Component Value Date   CREATININE 0.93 05/16/2020   BUN 21 05/16/2020   NA 141 05/16/2020   K 4.9 05/16/2020   CL 101 05/16/2020   CO2 24 05/16/2020   Lab Results  Component Value Date   ALT 36 (H) 05/16/2020   AST 27 05/16/2020   ALKPHOS 119 05/16/2020   BILITOT 0.7 05/16/2020   Lab Results  Component Value Date   HGBA1C 6.3 (A) 09/13/2020   HGBA1C 6.8 (A) 06/08/2020   HGBA1C 7.9 (H) 05/16/2020   HGBA1C 7.3 (A) 03/09/2020   HGBA1C 8.4 (A) 12/28/2019   Lab Results  Component Value Date   INSULIN 46.9 (H) 05/16/2020   Lab Results  Component Value Date   TSH 2.060 05/16/2020   Lab Results  Component Value Date   CHOL 158 05/16/2020   HDL 47 05/16/2020   LDLCALC 90 05/16/2020   LDLDIRECT 128.1 10/05/2013   TRIG 119 05/16/2020   CHOLHDL 3 03/20/2020   Lab Results  Component Value Date   VD25OH 26.5 (L) 05/16/2020   VD25OH 41 09/14/2009   Lab Results  Component Value Date   WBC 5.9 04/05/2020   HGB 14.5 04/05/2020   HCT 44.0 04/05/2020   MCV 90 04/05/2020   PLT 334 04/05/2020   No results found for: IRON, TIBC, FERRITIN  Attestation Statements:   Reviewed by clinician on day of visit: allergies, medications, problem list, medical history, surgical history, family history, social history, and previous encounter notes.   I, Lizbeth Bark, RMA, am acting as Location manager for [employer], DO.   I have reviewed the above documentation for accuracy and completeness, and I agree with the above. Jearld Lesch,  DO

## 2020-10-04 ENCOUNTER — Encounter: Payer: Self-pay | Admitting: Dermatology

## 2020-10-04 ENCOUNTER — Encounter (INDEPENDENT_AMBULATORY_CARE_PROVIDER_SITE_OTHER): Payer: Self-pay | Admitting: Bariatrics

## 2020-10-04 NOTE — Progress Notes (Signed)
   Follow-Up Visit   Subjective  Nicole Richard is a 65 y.o. female who presents for the following: Annual Exam (Skin check no new concerns ).  General skin examination, multiple rough spots on back and legs. Location:  Duration:  Quality:  Associated Signs/Symptoms: Modifying Factors:  Severity:  Timing: Context:   Objective  Well appearing patient in no apparent distress; mood and affect are within normal limits. Currently no atypical pigmented lesions or nonmelanoma skin cancer.  Left Leg, Right Leg Full body skin check, many keratosis on the legs more than back (3 to 8 mm flattopped tan to brown textured papules).. Angiomas are present and DF on the lower leg  Left Lower Leg - Anterior Firm 5 mm pink dermal papule    A full examination was performed including scalp, head, eyes, ears, nose, lips, neck, chest, axillae, abdomen, back, buttocks, bilateral upper extremities, bilateral lower extremities, hands, feet, fingers, toes, fingernails, and toenails. All findings within normal limits unless otherwise noted below.  Areas beneath undergarments not fully examined.   Assessment & Plan    Seborrheic keratosis (2) Left Leg; Right Leg  No atypia or skin cancer found today keep yearly skin checks   Encounter for screening for malignant neoplasm of skin  Annual skin examination, encouraged to self examine twice annually.  Continued ultraviolet protection.  Dermatofibroma Left Lower Leg - Anterior  May leave if stable      I, Lavonna Monarch, MD, have reviewed all documentation for this visit.  The documentation on 10/04/20 for the exam, diagnosis, procedures, and orders are all accurate and complete.

## 2020-10-09 ENCOUNTER — Other Ambulatory Visit: Payer: Self-pay | Admitting: Endocrinology

## 2020-10-15 ENCOUNTER — Encounter: Payer: Self-pay | Admitting: Family Medicine

## 2020-10-18 ENCOUNTER — Other Ambulatory Visit: Payer: Self-pay | Admitting: Endocrinology

## 2020-10-22 ENCOUNTER — Encounter: Payer: BC Managed Care – PPO | Admitting: Family Medicine

## 2020-10-22 ENCOUNTER — Ambulatory Visit: Payer: BC Managed Care – PPO | Admitting: Family Medicine

## 2020-10-25 ENCOUNTER — Other Ambulatory Visit: Payer: Self-pay

## 2020-10-26 ENCOUNTER — Ambulatory Visit (INDEPENDENT_AMBULATORY_CARE_PROVIDER_SITE_OTHER): Payer: BC Managed Care – PPO | Admitting: Family Medicine

## 2020-10-26 ENCOUNTER — Encounter: Payer: Self-pay | Admitting: Family Medicine

## 2020-10-26 VITALS — BP 120/82 | HR 73 | Temp 98.1°F | Resp 18 | Ht 61.0 in | Wt 161.4 lb

## 2020-10-26 DIAGNOSIS — J302 Other seasonal allergic rhinitis: Secondary | ICD-10-CM

## 2020-10-26 DIAGNOSIS — E1165 Type 2 diabetes mellitus with hyperglycemia: Secondary | ICD-10-CM | POA: Diagnosis not present

## 2020-10-26 DIAGNOSIS — I10 Essential (primary) hypertension: Secondary | ICD-10-CM

## 2020-10-26 DIAGNOSIS — E785 Hyperlipidemia, unspecified: Secondary | ICD-10-CM | POA: Diagnosis not present

## 2020-10-26 DIAGNOSIS — Z0000 Encounter for general adult medical examination without abnormal findings: Secondary | ICD-10-CM

## 2020-10-26 DIAGNOSIS — E559 Vitamin D deficiency, unspecified: Secondary | ICD-10-CM | POA: Diagnosis not present

## 2020-10-26 DIAGNOSIS — Z23 Encounter for immunization: Secondary | ICD-10-CM | POA: Diagnosis not present

## 2020-10-26 DIAGNOSIS — K219 Gastro-esophageal reflux disease without esophagitis: Secondary | ICD-10-CM

## 2020-10-26 DIAGNOSIS — J45901 Unspecified asthma with (acute) exacerbation: Secondary | ICD-10-CM

## 2020-10-26 DIAGNOSIS — E1169 Type 2 diabetes mellitus with other specified complication: Secondary | ICD-10-CM

## 2020-10-26 HISTORY — DX: Encounter for general adult medical examination without abnormal findings: Z00.00

## 2020-10-26 HISTORY — DX: Unspecified asthma with (acute) exacerbation: J45.901

## 2020-10-26 HISTORY — DX: Other seasonal allergic rhinitis: J30.2

## 2020-10-26 LAB — MICROALBUMIN / CREATININE URINE RATIO
Creatinine,U: 120.8 mg/dL
Microalb Creat Ratio: 0.6 mg/g (ref 0.0–30.0)
Microalb, Ur: <0.7 mg/dL (ref 0.0–1.9)

## 2020-10-26 LAB — HEMOGLOBIN A1C: Hgb A1c MFr Bld: 7 % — ABNORMAL HIGH (ref 4.6–6.5)

## 2020-10-26 LAB — CBC WITH DIFFERENTIAL/PLATELET
Basophils Absolute: 0 10*3/uL (ref 0.0–0.1)
Basophils Relative: 0.7 % (ref 0.0–3.0)
Eosinophils Absolute: 0.2 10*3/uL (ref 0.0–0.7)
Eosinophils Relative: 2.6 % (ref 0.0–5.0)
HCT: 43.7 % (ref 36.0–46.0)
Hemoglobin: 14.5 g/dL (ref 12.0–15.0)
Lymphocytes Relative: 31.7 % (ref 12.0–46.0)
Lymphs Abs: 2.1 10*3/uL (ref 0.7–4.0)
MCHC: 33.1 g/dL (ref 30.0–36.0)
MCV: 88.6 fl (ref 78.0–100.0)
Monocytes Absolute: 0.4 10*3/uL (ref 0.1–1.0)
Monocytes Relative: 6.4 % (ref 3.0–12.0)
Neutro Abs: 3.8 10*3/uL (ref 1.4–7.7)
Neutrophils Relative %: 58.6 % (ref 43.0–77.0)
Platelets: 337 10*3/uL (ref 150.0–400.0)
RBC: 4.93 Mil/uL (ref 3.87–5.11)
RDW: 13.2 % (ref 11.5–15.5)
WBC: 6.6 10*3/uL (ref 4.0–10.5)

## 2020-10-26 LAB — COMPREHENSIVE METABOLIC PANEL
ALT: 31 U/L (ref 0–35)
AST: 20 U/L (ref 0–37)
Albumin: 4.6 g/dL (ref 3.5–5.2)
Alkaline Phosphatase: 105 U/L (ref 39–117)
BUN: 28 mg/dL — ABNORMAL HIGH (ref 6–23)
CO2: 31 mEq/L (ref 19–32)
Calcium: 10.3 mg/dL (ref 8.4–10.5)
Chloride: 104 mEq/L (ref 96–112)
Creatinine, Ser: 0.97 mg/dL (ref 0.40–1.20)
GFR: 62.29 mL/min (ref >60.00)
Glucose, Bld: 127 mg/dL — ABNORMAL HIGH (ref 70–99)
Potassium: 4.8 mEq/L (ref 3.5–5.1)
Sodium: 141 mEq/L (ref 135–145)
Total Bilirubin: 0.7 mg/dL (ref 0.2–1.2)
Total Protein: 7.4 g/dL (ref 6.0–8.3)

## 2020-10-26 LAB — LIPID PANEL
Cholesterol: 149 mg/dL (ref 0–200)
HDL: 49.8 mg/dL (ref >39.00)
LDL Cholesterol: 81 mg/dL (ref 0–99)
NonHDL: 98.91
Total CHOL/HDL Ratio: 3
Triglycerides: 92 mg/dL (ref 0.0–149.0)
VLDL: 18.4 mg/dL (ref 0.0–40.0)

## 2020-10-26 LAB — TSH: TSH: 1.05 u[IU]/mL (ref 0.35–5.50)

## 2020-10-26 LAB — VITAMIN D 25 HYDROXY (VIT D DEFICIENCY, FRACTURES): VITD: 63.69 ng/mL (ref 30.00–100.00)

## 2020-10-26 MED ORDER — PANTOPRAZOLE SODIUM 40 MG PO TBEC: 40.0000 mg | DELAYED_RELEASE_TABLET | Freq: Two times a day (BID) | ORAL | 3 refills | Status: DC

## 2020-10-26 MED ORDER — COMBIVENT RESPIMAT 20-100 MCG/ACT IN AERS: 1.0000 | INHALATION_SPRAY | Freq: Four times a day (QID) | RESPIRATORY_TRACT | 5 refills | Status: AC | PRN

## 2020-10-26 MED ORDER — VITAMIN D (ERGOCALCIFEROL) 1.25 MG (50000 UNIT) PO CAPS: 50000.0000 [IU] | ORAL_CAPSULE | ORAL | 1 refills | Status: DC

## 2020-10-26 MED ORDER — FENOFIBRATE 160 MG PO TABS: 160.0000 mg | ORAL_TABLET | Freq: Every day | ORAL | 1 refills | Status: DC

## 2020-10-26 MED ORDER — MONTELUKAST SODIUM 10 MG PO TABS: 10.0000 mg | ORAL_TABLET | Freq: Every day | ORAL | 3 refills | Status: DC

## 2020-10-26 MED ORDER — ATORVASTATIN CALCIUM 40 MG PO TABS: ORAL_TABLET | ORAL | 1 refills | Status: DC

## 2020-10-26 NOTE — Assessment & Plan Note (Signed)
Tolerating statin, encouraged heart healthy diet, avoid trans fats, minimize simple carbs and saturated fats. Increase exercise as tolerated 

## 2020-10-26 NOTE — Patient Instructions (Signed)
Preventive Care 17-63 Years Old, Female Preventive care refers to lifestyle choices and visits with your health care provider that can promote health and wellness. This includes: A yearly physical exam. This is also called an annual wellness visit. Regular dental and eye exams. Immunizations. Screening for certain conditions. Healthy lifestyle choices, such as: Eating a healthy diet. Getting regular exercise. Not using drugs or products that contain nicotine and tobacco. Limiting alcohol use. What can I expect for my preventive care visit? Physical exam Your health care provider will check your: Height and weight. These may be used to calculate your BMI (body mass index). BMI is a measurement that tells if you are at a healthy weight. Heart rate and blood pressure. Body temperature. Skin for abnormal spots. Counseling Your health care provider may ask you questions about your: Past medical problems. Family's medical history. Alcohol, tobacco, and drug use. Emotional well-being. Home life and relationship well-being. Sexual activity. Diet, exercise, and sleep habits. Work and work Statistician. Access to firearms. Method of birth control. Menstrual cycle. Pregnancy history. What immunizations do I need? Vaccines are usually given at various ages, according to a schedule. Your health care provider will recommend vaccines for you based on your age, medical history, and lifestyle or other factors, such as travel or where you work. What tests do I need? Blood tests Lipid and cholesterol levels. These may be checked every 5 years, or more often if you are over 63 years old. Hepatitis C test. Hepatitis B test. Screening Lung cancer screening. You may have this screening every year starting at age 63 if you have a 30-pack-year history of smoking and currently smoke or have quit within the past 15 years. Colorectal cancer screening. All adults should have this screening starting at  age 45 and continuing until age 63. Your health care provider may recommend screening at age 33 if you are at increased risk. You will have tests every 1-10 years, depending on your results and the type of screening test. Diabetes screening. This is done by checking your blood sugar (glucose) after you have not eaten for a while (fasting). You may have this done every 1-3 years. Mammogram. This may be done every 1-2 years. Talk with your health care provider about when you should start having regular mammograms. This may depend on whether you have a family history of breast cancer. BRCA-related cancer screening. This may be done if you have a family history of breast, ovarian, tubal, or peritoneal cancers. Pelvic exam and Pap test. This may be done every 3 years starting at age 25. Starting at age 63, this may be done every 5 years if you have a Pap test in combination with an HPV test. Other tests STD (sexually transmitted disease) testing, if you are at risk. Bone density scan. This is done to screen for osteoporosis. You may have this scan if you are at high risk for osteoporosis. Talk with your health care provider about your test results, treatment options, and if necessary, the need for more tests. Follow these instructions at home: Eating and drinking  Eat a diet that includes fresh fruits and vegetables, whole grains, lean protein, and low-fat dairy products. Take vitamin and mineral supplements as recommended by your health care provider. Do not drink alcohol if: Your health care provider tells you not to drink. You are pregnant, may be pregnant, or are planning to become pregnant. If you drink alcohol: Limit how much you have to 0-1 drink a day. Be  aware of how much alcohol is in your drink. In the [place] one drink equals one 12 oz bottle of beer (355 mL), one 5 oz glass of wine (148 mL), or one 1 oz glass of hard liquor (44 mL). Lifestyle Take daily care of your teeth and  gums. Brush your teeth every morning and night with fluoride toothpaste. Floss one time each day. Stay active. Exercise for at least 30 minutes 5 or more days each week. Do not use any products that contain nicotine or tobacco, such as cigarettes, e-cigarettes, and chewing tobacco. If you need help quitting, ask your health care provider. Do not use drugs. If you are sexually active, practice safe sex. Use a condom or other form of protection to prevent STIs (sexually transmitted infections). If you do not wish to become pregnant, use a form of birth control. If you plan to become pregnant, see your health care provider for a prepregnancy visit. If told by your health care provider, take low-dose aspirin daily starting at age 63. Find healthy ways to cope with stress, such as: Meditation, yoga, or listening to music. Journaling. Talking to a trusted person. Spending time with friends and family. Safety Always wear your seat belt while driving or riding in a vehicle. Do not drive: If you have been drinking alcohol. Do not ride with someone who has been drinking. When you are tired or distracted. While texting. Wear a helmet and other protective equipment during sports activities. If you have firearms in your house, make sure you follow all gun safety procedures. What's next? Visit your health care provider once a year for an annual wellness visit. Ask your health care provider how often you should have your eyes and teeth checked. Stay up to date on all vaccines. This information is not intended to replace advice given to you by your health care provider. Make sure you discuss any questions you have with your health care provider. Document Revised: 03/16/2020 Document Reviewed: 09/17/2017 Elsevier Patient Education  2022 [employer].

## 2020-10-26 NOTE — Progress Notes (Signed)
Subjective:   By signing my name below, I, Zite Okoli, attest that this documentation has been prepared under the direction and in the presence of Carollee Herter, Kendrick Fries R DO. 10/26/2020   Patient ID: Nicole Richard, female    DOB: Mar 07, 1955, 65 y.o.   MRN: [id number]  Chief Complaint  Patient presents with   Annual Exam    Pt states fasting     HPI Patient is in today for a comprehensive physical exam.  She reports that she experiences occasional numbness and tingling in her feet. She has told her endocrinologist about it but she does not think it is bad enough to treat with any medications.   She is requesting a a refill on 10 mg Singulair, 40 mg Lipitor, 160 mg fenofibrate and 40 mg Protonix.  She reports that she has lost 5 pounds since starting the [facility] program and has no complaints about it. She is on the Category 2 Diet plan. She was supposed to start weight loss medications but insurance would not cover it. She has been using 14 mg rybelsus to manage her blood sugar levels and is doing well on it.   She denies fever, hearing loss, ear pain,congestion, sinus pain, sore throat, eye pain, chest pain, palpitations, cough, shortness of breath, wheezing, nausea. vomiting, diarrhea, constipation, blood in stool, dysuria,frequency, hematuria and headaches.   She exercises regularly by walking everyday.   She is willing to get the flu vaccine today. She has 5 Covid-19 vaccines at this time. She is UTD on the shingles vaccine.  Her mother had a stroke and passed away in 02/20/20 at age 39.  Past Medical History:  Diagnosis Date   Abdominal pain, left upper quadrant 03/01/2013   Abnormal liver function tests 03/01/2013   Allergy    seasonal   Angina at rest [facility]) 03/21/2020   Angina pectoris ([place]) 03/21/2020   Asthma    Back pain    BREAST IMPLANTS, BILATERAL, HX OF 07/28/2007   Qualifier: Diagnosis of  By: [name]     CYSTOCELE WITH INCOMPLETE  UTERINE PROLAPSE 05/18/2006   Qualifier: Diagnosis of  By: [name]     Diabetes ([place]) 01/29/2014   Diabetes mellitus without complication ([place])    Edema of both lower extremities    ESOPHAGEAL STRICTURE 11/09/2009   Qualifier: Diagnosis of  By: [name] (Mariano Colon), [name]     Essential hypertension 05/18/2006   Qualifier: Diagnosis of  By: [name]     GERD 09/14/2009   Qualifier: Diagnosis of  By: [name]     GERD (gastroesophageal reflux disease)    HEADACHE 05/18/2006   Qualifier: Diagnosis of  By: [name]     Hirsutism 05/18/2006   Qualifier: Diagnosis of  By: [name]     Hyperlipidemia    Hyperlipidemia associated with type 2 diabetes mellitus ([place]) 04/15/2019   Hypertension    Kidney stones    LAPAROSCOPY, HX OF 05/18/2006   Qualifier: Diagnosis of  By: [name]     Nonspecific (abnormal) findings on radiological and other examination of biliary tract 03/01/2013   Obesity (BMI 30-39.9) 10/18/2012   Other chest pain 03/21/2020   Palpitation 03/21/2020   Palpitations    POSTMENOPAUSAL STATUS 09/14/2009   Qualifier: Diagnosis of  By: [name]     SKIN TAG 10/05/2009   Qualifier: Diagnosis of  By: [name]  SOB (shortness of breath)    Uncontrolled type 2 diabetes mellitus with hyperglycemia ([place]) 04/15/2019   Vitamin D deficiency    Vitamin D insufficiency 05/21/2020    Past Surgical History:  Procedure Laterality Date   ABDOMINAL HYSTERECTOMY  08/2004   APPENDECTOMY     AUGMENTATION MAMMAPLASTY Bilateral 2000   BREAST ENHANCEMENT SURGERY  2001   COLONOSCOPY     LEFT HEART CATH AND CORONARY ANGIOGRAPHY N/A 04/09/2020   Procedure: LEFT HEART CATH AND CORONARY ANGIOGRAPHY;  Surgeon: Leonie Man, MD;  Location: [facility];  Service: Cardiovascular;  Laterality: N/A;   POLYPECTOMY  2009   sigmoid polyps   SHOULDER ARTHROSCOPY W/ ROTATOR CUFF REPAIR Right 06/06/2015   [facility]   TONSILLECTOMY     TUBAL  LIGATION      Family History  Problem Relation Age of Onset   Rheumatic fever Mother    Irritable bowel syndrome Mother    Breast cancer Mother    Diabetes Mother    High blood pressure Mother    High Cholesterol Mother    Heart disease Mother    Stroke Mother    Sudden death Mother    Obesity Mother    Cancer Father    Diabetes Father    Colon polyps Father    Kidney disease Father    High blood pressure Father    High Cholesterol Father    Heart disease Father    Obesity Father    Cervical cancer Maternal Grandmother    Breast cancer Maternal Grandmother    Colon cancer Maternal Grandmother    Uterine cancer Maternal Grandmother    Coronary artery disease Other    Hyperlipidemia Other    Hypertension Other    Asthma Other    Esophageal cancer Neg Hx    Rectal cancer Neg Hx    Stomach cancer Neg Hx     Social History   Socioeconomic History   Marital status: Divorced    Spouse name: Not on file   Number of children: 2   Years of education: Not on file   Highest education level: Not on file  Occupational History   Occupation: Nurse  Tobacco Use   Smoking status: Never   Smokeless tobacco: Never  Vaping Use   Vaping Use: Never used  Substance and Sexual Activity   Alcohol use: Yes    Comment: rare   Drug use: No   Sexual activity: Not Currently    Partners: Male  Other Topics Concern   Not on file  Social History Narrative   Buyer, retail , and pt daily   Social Determinants of Health   Financial Resource Strain: Not on file  Food Insecurity: Not on file  Transportation Needs: Not on file  Physical Activity: Not on file  Stress: Not on file  Social Connections: Not on file  Intimate Partner Violence: Not on file    Outpatient Medications Prior to Visit  Medication Sig Dispense Refill   aspirin 81 MG chewable tablet Chew 81 mg by mouth at bedtime.     atenolol (TENORMIN) 25 MG tablet Take 25 mg by mouth daily.     bromocriptine (PARLODEL)  2.5 MG tablet Take 0.5 tablets (1.25 mg total) by mouth daily. 45 tablet 3   Calcium Carbonate-Vitamin D (OSCAL 500/200 D-3 PO) Take 1 tablet by mouth at bedtime.     CRANBERRY-VITAMIN C PO Take 2 capsules by mouth at bedtime.     Flaxseed, Linseed, (FLAXSEED  OIL PO) Take 1,400 mg by mouth at bedtime.     mometasone (NASONEX) 50 MCG/ACT nasal spray Place 2 sprays into the nose daily.     Propylene Glycol 0.6 % SOLN Apply 1 drop to eye 4 (four) times daily as needed for dry eyes (dry/irritated eyes).     repaglinide (PRANDIN) 2 MG tablet Take 1 tablet (2 mg total) by mouth 2 (two) times daily before a meal. 180 tablet 3   Semaglutide (RYBELSUS) 14 MG TABS Take 14 mg by mouth daily. 90 tablet 3   atorvastatin (LIPITOR) 40 MG tablet TAKE 1 TABLET BY MOUTH EVERYDAY AT BEDTIME 90 tablet 1   fenofibrate 160 MG tablet TAKE 1 TABLET BY MOUTH EVERY DAY 90 tablet 1   Ipratropium-Albuterol (COMBIVENT RESPIMAT) 20-100 MCG/ACT AERS respimat Inhale 1 puff into the lungs 4 (four) times daily as needed for wheezing or shortness of breath. 4 g 5   montelukast (SINGULAIR) 10 MG tablet TAKE 1 TABLET BY MOUTH EVERY DAY 90 tablet 3   pantoprazole (PROTONIX) 40 MG tablet Take 1 tablet (40 mg total) by mouth 2 (two) times daily. 180 tablet 3   Vitamin D, Ergocalciferol, (DRISDOL) 1.25 MG (50000 UNIT) CAPS capsule Take 1 capsule (50,000 Units total) by mouth every 7 (seven) days. 4 capsule 0   nitroGLYCERIN (NITROSTAT) 0.4 MG SL tablet Place 1 tablet (0.4 mg total) under the tongue every 5 (five) minutes as needed for chest pain. 25 tablet 3   No facility-administered medications prior to visit.    Allergies  Allergen Reactions   Contrast Media [Iodinated Diagnostic Agents] Anaphylaxis   Metamucil [Psyllium] Anaphylaxis   Mustard Seed Anaphylaxis    Review of Systems  Constitutional:  Negative for fever.  HENT:  Negative for congestion, ear pain, hearing loss, sinus pain and sore throat.   Eyes:  Negative for  blurred vision and pain.  Respiratory:  Negative for cough, sputum production, shortness of breath and wheezing.   Cardiovascular:  Negative for chest pain and palpitations.  Gastrointestinal:  Negative for blood in stool, constipation, diarrhea, nausea and vomiting.  Genitourinary:  Negative for dysuria, frequency, hematuria and urgency.  Musculoskeletal:  Negative for back pain, falls and myalgias.  Neurological:  Positive for tingling (feet). Negative for dizziness, sensory change, loss of consciousness, weakness and headaches.       (+) numbness in feet  Endo/Heme/Allergies:  Negative for environmental allergies. Does not bruise/bleed easily.  Psychiatric/Behavioral:  Negative for depression and suicidal ideas. The patient is not nervous/anxious and does not have insomnia.       Objective:    Physical Exam Constitutional:      General: She is not in acute distress.    Appearance: Normal appearance. She is not ill-appearing.  HENT:     Head: Normocephalic and atraumatic.     Right Ear: Tympanic membrane, ear canal and external ear normal.     Left Ear: Tympanic membrane, ear canal and external ear normal.  Eyes:     Extraocular Movements: Extraocular movements intact.     Pupils: Pupils are equal, round, and reactive to light.  Cardiovascular:     Rate and Rhythm: Normal rate and regular rhythm.     Pulses: Normal pulses.     Heart sounds: Normal heart sounds. No murmur heard.   No gallop.  Pulmonary:     Effort: Pulmonary effort is normal. No respiratory distress.     Breath sounds: Normal breath sounds. No wheezing, rhonchi or rales.  Abdominal:     General: Bowel sounds are normal. There is no distension.     Palpations: Abdomen is soft. There is no mass.     Tenderness: There is no abdominal tenderness. There is no guarding or rebound.     Hernia: No hernia is present.  Musculoskeletal:     Cervical back: Normal range of motion and neck supple.  Feet:     Comments:  Diabetic Foot Exam - Simple   No data filed    Lymphadenopathy:     Cervical: No cervical adenopathy.  Skin:    General: Skin is warm and dry.  Neurological:     Mental Status: She is alert and oriented to person, place, and time.  Psychiatric:        Behavior: Behavior normal.    BP 120/82 (BP Location: Left Arm, Patient Position: Sitting, Cuff Size: Normal)   Pulse 73   Temp 98.1 F (36.7 C) (Oral)   Resp 18   Ht 5' 1"  (1.549 m)   Wt 161 lb 6.4 oz (73.2 kg)   SpO2 96%   BMI 30.50 kg/m  Wt Readings from Last 3 Encounters:  10/26/20 161 lb 6.4 oz (73.2 kg)  10/03/20 160 lb (72.6 kg)  09/13/20 164 lb (74.4 kg)    Diabetic Foot Exam - Simple   Simple Foot Form Diabetic Foot exam was performed with the following findings: Yes 10/26/2020  8:39 AM  Visual Inspection No deformities, no ulcerations, no other skin breakdown bilaterally: Yes Sensation Testing Intact to touch and monofilament testing bilaterally: Yes Pulse Check Posterior Tibialis and Dorsalis pulse intact bilaterally: Yes Comments    Lab Results  Component Value Date   WBC 5.9 04/05/2020   HGB 14.5 04/05/2020   HCT 44.0 04/05/2020   PLT 334 04/05/2020   GLUCOSE 132 (H) 05/16/2020   CHOL 158 05/16/2020   TRIG 119 05/16/2020   HDL 47 05/16/2020   LDLDIRECT 128.1 10/05/2013   LDLCALC 90 05/16/2020   ALT 36 (H) 05/16/2020   AST 27 05/16/2020   NA 141 05/16/2020   K 4.9 05/16/2020   CL 101 05/16/2020   CREATININE 0.93 05/16/2020   BUN 21 05/16/2020   CO2 24 05/16/2020   TSH 2.060 05/16/2020   HGBA1C 6.3 (A) 09/13/2020   MICROALBUR 0.6 10/21/2019    Lab Results  Component Value Date   TSH 2.060 05/16/2020   Lab Results  Component Value Date   WBC 5.9 04/05/2020   HGB 14.5 04/05/2020   HCT 44.0 04/05/2020   MCV 90 04/05/2020   PLT 334 04/05/2020   Lab Results  Component Value Date   NA 141 05/16/2020   K 4.9 05/16/2020   CO2 24 05/16/2020   GLUCOSE 132 (H) 05/16/2020   BUN 21  05/16/2020   CREATININE 0.93 05/16/2020   BILITOT 0.7 05/16/2020   ALKPHOS 119 05/16/2020   AST 27 05/16/2020   ALT 36 (H) 05/16/2020   PROT 7.3 05/16/2020   ALBUMIN 4.7 05/16/2020   CALCIUM 10.4 (H) 05/16/2020   EGFR 69 05/16/2020   GFR 85.17 03/20/2020   Lab Results  Component Value Date   CHOL 158 05/16/2020   Lab Results  Component Value Date   HDL 47 05/16/2020   Lab Results  Component Value Date   LDLCALC 90 05/16/2020   Lab Results  Component Value Date   TRIG 119 05/16/2020   Lab Results  Component Value Date   CHOLHDL 3 03/20/2020   Lab  Results  Component Value Date   HGBA1C 6.3 (A) 09/13/2020    Mammogram: Last completed on 09/19/2020. Results were normal. Patient has implants. Repeat in 1 year. Dexa: Last completed on 09/26/2010. Results show patient is osteoporotic according to the [facility]. Repeat in 2 years. Due Colonoscopy: Last completed on 09/10/2018. Results showed diminutive colon polyps secondary to polypectomy, mild sigmoid diverticulosis and non-bleeding internal hemorrhoids. Otherwise exam was normal. Repeat in 5 years     Assessment & Plan:   Problem List Items Addressed This Visit       Unprioritized   Hyperlipidemia associated with type 2 diabetes mellitus ([place]) (Chronic)   Relevant Medications   fenofibrate 160 MG tablet   atorvastatin (LIPITOR) 40 MG tablet   Other Relevant Orders   Comprehensive metabolic panel   Lipid panel   GERD   Relevant Medications   pantoprazole (PROTONIX) 40 MG tablet   Diabetes (HCC)   Relevant Medications   atorvastatin (LIPITOR) 40 MG tablet   Other Relevant Orders   Comprehensive metabolic panel   Hemoglobin A1c   Microalbumin / creatinine urine ratio   Insulin, random   Hypertension   Relevant Medications   fenofibrate 160 MG tablet   atorvastatin (LIPITOR) 40 MG tablet   Other Relevant Orders   CBC with Differential/Platelet   Comprehensive metabolic panel   Lipid  panel   TSH   VITAMIN D 25 Hydroxy (Vit-D Deficiency, Fractures)   Microalbumin / creatinine urine ratio   Extrinsic asthma with exacerbation, unspecified asthma severity   Relevant Medications   montelukast (SINGULAIR) 10 MG tablet   Ipratropium-Albuterol (COMBIVENT RESPIMAT) 20-100 MCG/ACT AERS respimat   Preventative health care - Primary    ghm utd Check labs       Relevant Orders   CBC with Differential/Platelet   Comprehensive metabolic panel   Lipid panel   TSH   VITAMIN D 25 Hydroxy (Vit-D Deficiency, Fractures)   Hemoglobin A1c   Seasonal allergic rhinitis   Relevant Medications   montelukast (SINGULAIR) 10 MG tablet   Uncontrolled type 2 diabetes mellitus with hyperglycemia (HCC)    hgba1c to be checked , minimize simple carbs. Increase exercise as tolerated. Continue current meds       Relevant Medications   atorvastatin (LIPITOR) 40 MG tablet   Vitamin D deficiency    Check labs       Relevant Medications   Vitamin D, Ergocalciferol, (DRISDOL) 1.25 MG (50000 UNIT) CAPS capsule   Other Relevant Orders   VITAMIN D 25 Hydroxy (Vit-D Deficiency, Fractures)   Other Visit Diagnoses     Type 2 diabetes mellitus with other specified complication ([place])       Relevant Medications   atorvastatin (LIPITOR) 40 MG tablet   Need for influenza vaccination       Relevant Orders   Flu Vaccine QUAD 46moIM (Fluarix, Fluzone & Alfiuria Quad PF) (Completed)       Meds ordered this encounter  Medications   pantoprazole (PROTONIX) 40 MG tablet    Sig: Take 1 tablet (40 mg total) by mouth 2 (two) times daily.    Dispense:  180 tablet    Refill:  3   montelukast (SINGULAIR) 10 MG tablet    Sig: Take 1 tablet (10 mg total) by mouth daily.    Dispense:  90 tablet    Refill:  3   Ipratropium-Albuterol (COMBIVENT RESPIMAT) 20-100 MCG/ACT AERS respimat    Sig: Inhale 1 puff into the lungs 4 (  four) times daily as needed for wheezing or shortness of breath.    Dispense:  4 g     Refill:  5   fenofibrate 160 MG tablet    Sig: Take 1 tablet (160 mg total) by mouth daily.    Dispense:  90 tablet    Refill:  1   atorvastatin (LIPITOR) 40 MG tablet    Sig: TAKE 1 TABLET BY MOUTH EVERYDAY AT BEDTIME    Dispense:  90 tablet    Refill:  1   Vitamin D, Ergocalciferol, (DRISDOL) 1.25 MG (50000 UNIT) CAPS capsule    Sig: Take 1 capsule (50,000 Units total) by mouth every 7 (seven) days.    Dispense:  12 capsule    Refill:  1    I,Zite Okoli,acting as a scribe for [employer], DO.,have documented all relevant documentation on the behalf of [name], DO,as directed by  [name], DO while in the presence of [name], DO.   I, [name] DO., personally preformed the services described in this documentation.  All medical record entries made by the scribe were at my direction and in my presence.  I have reviewed the chart and discharge instructions (if applicable) and agree that the record reflects my personal performance and is accurate and complete. 10/26/2020

## 2020-10-26 NOTE — Assessment & Plan Note (Signed)
hgba1c to be checked, minimize simple carbs. Increase exercise as tolerated. Continue current meds  

## 2020-10-26 NOTE — Assessment & Plan Note (Signed)
ghm utd Check labs  

## 2020-10-26 NOTE — Assessment & Plan Note (Signed)
Check labs 

## 2020-10-29 ENCOUNTER — Other Ambulatory Visit (INDEPENDENT_AMBULATORY_CARE_PROVIDER_SITE_OTHER): Payer: Self-pay | Admitting: Bariatrics

## 2020-10-29 ENCOUNTER — Encounter (INDEPENDENT_AMBULATORY_CARE_PROVIDER_SITE_OTHER): Payer: Self-pay

## 2020-10-29 ENCOUNTER — Encounter (INDEPENDENT_AMBULATORY_CARE_PROVIDER_SITE_OTHER): Payer: Self-pay | Admitting: Bariatrics

## 2020-10-29 ENCOUNTER — Other Ambulatory Visit: Payer: Self-pay

## 2020-10-29 ENCOUNTER — Ambulatory Visit (INDEPENDENT_AMBULATORY_CARE_PROVIDER_SITE_OTHER): Payer: BC Managed Care – PPO | Admitting: Bariatrics

## 2020-10-29 VITALS — BP 115/70 | HR 72 | Temp 98.2°F | Ht 61.0 in | Wt 159.0 lb

## 2020-10-29 DIAGNOSIS — E785 Hyperlipidemia, unspecified: Secondary | ICD-10-CM

## 2020-10-29 DIAGNOSIS — E1169 Type 2 diabetes mellitus with other specified complication: Secondary | ICD-10-CM

## 2020-10-29 DIAGNOSIS — E1165 Type 2 diabetes mellitus with hyperglycemia: Secondary | ICD-10-CM

## 2020-10-29 DIAGNOSIS — E669 Obesity, unspecified: Secondary | ICD-10-CM | POA: Diagnosis not present

## 2020-10-29 DIAGNOSIS — Z9189 Other specified personal risk factors, not elsewhere classified: Secondary | ICD-10-CM

## 2020-10-29 DIAGNOSIS — Z6831 Body mass index (BMI) 31.0-31.9, adult: Secondary | ICD-10-CM

## 2020-10-29 LAB — INSULIN, RANDOM: Insulin: 24.4 u[IU]/mL — ABNORMAL HIGH

## 2020-10-29 MED ORDER — TIRZEPATIDE 5 MG/0.5ML ~~LOC~~ SOAJ: 5.0000 mg | SUBCUTANEOUS | 0 refills | Status: DC

## 2020-10-29 NOTE — Telephone Encounter (Signed)
Prior authorization has been started for Mounjaro. Will notify patient and provider once response is received.  

## 2020-10-29 NOTE — Progress Notes (Signed)
Chief Complaint:   OBESITY Nicole Richard is here to discuss her progress with her obesity treatment plan along with follow-up of her obesity related diagnoses. Nicole Richard is on the Category 2 Plan and states she is following her eating plan approximately 95% of the time. Nicole Richard states she is walking for 30 minutes 5 times per week.  Today's visit was #: 8 Starting weight: 163 lbs Starting date: 05/13/2020 Today's weight: 159 lbs Today's date: 10/29/2020 Total lbs lost to date: 4 lbs Total lbs lost since last in-office visit: 1 lb  Interim History: Nicole Richard is down 1 additional pound since her last visit. She is not getting enough water.  Subjective:   1. Hyperlipidemia associated with type 2 diabetes mellitus (Nicole Richard) Nicole Richard is currently taking Lipitor and fenofibrate.  2. Type 2 diabetes mellitus with hyperglycemia, without long-term current use of insulin (Nicole Richard) Nicole Richard is taking Prandin and Rybelsus.  3. At risk for side effect of medication Nicole Richard is at risk for side effect of medication due to taking Mounjaro.  Assessment/Plan:   1. Hyperlipidemia associated with type 2 diabetes mellitus (Nicole Richard) Cardiovascular risk and specific lipid/LDL goals reviewed.  We discussed several lifestyle modifications today and Nicole Richard will continue to work on diet, exercise and weight loss efforts. Nicole Richard will continue taking Lipitor and fenofibrate.Orders and follow up as documented in patient record.   Counseling Intensive lifestyle modifications are the first line treatment for this issue. Dietary changes: Increase soluble fiber. Decrease simple carbohydrates. Exercise changes: Moderate to vigorous-intensity aerobic activity 150 minutes per week if tolerated. Lipid-lowering medications: see documented in medical record.   2. Type 2 diabetes mellitus with hyperglycemia, without long-term current use of insulin (Nicole Richard) Nicole Richard will continue medications. She will stop Rybelsus 14 mg and Nicole Richard agrees to  start Mounjaro 5 mg. We will fill Mounjaro 5 mg once a week for 1 month with no refills. Good blood sugar control is important to decrease the likelihood of diabetic complications such as nephropathy, neuropathy, limb loss, blindness, coronary artery disease, and death. Intensive lifestyle modification including diet, exercise and weight loss are the first line of treatment for diabetes.   - tirzepatide [facility]) 5 MG/0.5ML Pen; Inject 5 mg into the skin once a week.  Dispense: 2 mL; Refill: 0  3. At risk for side effect of medication Nicole Richard was given approximately 15 minutes of drug side effect counseling today.  We discussed side effect possibility and risk versus benefits. Nicole Richard agreed to the medication and will contact this office if these side effects are intolerable.  Repetitive spaced learning was employed today to elicit superior memory formation and behavioral change.   4. Obesity with current BMI of 30.1 Nicole Richard is currently in the action stage of change. As such, her goal is to continue with weight loss efforts. She has agreed to the Category 2 Plan.   Nicole Richard will continue meal planning and intentional eating. She will increase water intake at 64 ounces daily. We discuss "My Fitness Pal" in detail.  Exercise goals:  As is.  Behavioral modification strategies: increasing lean protein intake, decreasing simple carbohydrates, increasing vegetables, increasing water intake, decreasing eating out, no skipping meals, meal planning and cooking strategies, keeping healthy foods in the home, and planning for success.  Nicole Richard has agreed to follow-up with our clinic in 3 weeks. She was informed of the importance of frequent follow-up visits to maximize her success with intensive lifestyle modifications for her multiple health conditions.   Objective:   Blood pressure  115/70, pulse 72, temperature 98.2 F (36.8 C), temperature source Oral, height 5\' 1"  (1.549 m), weight 159 lb (72.1 kg), SpO2  98 %. Body mass index is 30.04 kg/m.  General: Cooperative, alert, well developed, in no acute distress. HEENT: Conjunctivae and lids unremarkable. Cardiovascular: Regular rhythm.  Lungs: Normal work of breathing. Neurologic: No focal deficits.   Lab Results  Component Value Date   CREATININE 0.97 10/26/2020   BUN 28 (H) 10/26/2020   NA 141 10/26/2020   K 4.8 10/26/2020   CL 104 10/26/2020   CO2 31 10/26/2020   Lab Results  Component Value Date   ALT 31 10/26/2020   AST 20 10/26/2020   ALKPHOS 105 10/26/2020   BILITOT 0.7 10/26/2020   Lab Results  Component Value Date   HGBA1C 7.0 (H) 10/26/2020   HGBA1C 6.3 (A) 09/13/2020   HGBA1C 6.8 (A) 06/08/2020   HGBA1C 7.9 (H) 05/16/2020   HGBA1C 7.3 (A) 03/09/2020   Lab Results  Component Value Date   INSULIN 46.9 (H) 05/16/2020   Lab Results  Component Value Date   TSH 1.05 10/26/2020   Lab Results  Component Value Date   CHOL 149 10/26/2020   HDL 49.80 10/26/2020   LDLCALC 81 10/26/2020   LDLDIRECT 128.1 10/05/2013   TRIG 92.0 10/26/2020   CHOLHDL 3 10/26/2020   Lab Results  Component Value Date   VD25OH 63.69 10/26/2020   VD25OH 26.5 (L) 05/16/2020   VD25OH 41 09/14/2009   Lab Results  Component Value Date   WBC 6.6 10/26/2020   HGB 14.5 10/26/2020   HCT 43.7 10/26/2020   MCV 88.6 10/26/2020   PLT 337.0 10/26/2020   No results found for: IRON, TIBC, FERRITIN  Attestation Statements:   Reviewed by clinician on day of visit: allergies, medications, problem list, medical history, surgical history, family history, social history, and previous encounter notes.  I, Lizbeth Bark, RMA, am acting as Location manager for [employer], DO.   I have reviewed the above documentation for accuracy and completeness, and I agree with the above. Jearld Lesch, DO

## 2020-10-29 NOTE — Telephone Encounter (Signed)
Could you please check PA? Thanks, [employer]

## 2020-10-30 ENCOUNTER — Encounter (INDEPENDENT_AMBULATORY_CARE_PROVIDER_SITE_OTHER): Payer: Self-pay | Admitting: Bariatrics

## 2020-11-06 NOTE — Telephone Encounter (Signed)
Can you please close this encounter? I don't have access to close this note. Thanks!

## 2020-11-19 ENCOUNTER — Encounter (INDEPENDENT_AMBULATORY_CARE_PROVIDER_SITE_OTHER): Payer: Self-pay

## 2020-11-26 ENCOUNTER — Other Ambulatory Visit: Payer: Self-pay

## 2020-11-26 ENCOUNTER — Ambulatory Visit (INDEPENDENT_AMBULATORY_CARE_PROVIDER_SITE_OTHER): Payer: BC Managed Care – PPO | Admitting: Bariatrics

## 2020-11-26 ENCOUNTER — Encounter (INDEPENDENT_AMBULATORY_CARE_PROVIDER_SITE_OTHER): Payer: Self-pay | Admitting: Bariatrics

## 2020-11-26 VITALS — BP 123/77 | HR 71 | Temp 98.0°F | Ht 61.0 in | Wt 159.0 lb

## 2020-11-26 DIAGNOSIS — E669 Obesity, unspecified: Secondary | ICD-10-CM | POA: Diagnosis not present

## 2020-11-26 DIAGNOSIS — E1165 Type 2 diabetes mellitus with hyperglycemia: Secondary | ICD-10-CM | POA: Diagnosis not present

## 2020-11-26 DIAGNOSIS — R632 Polyphagia: Secondary | ICD-10-CM | POA: Diagnosis not present

## 2020-11-26 DIAGNOSIS — Z6831 Body mass index (BMI) 31.0-31.9, adult: Secondary | ICD-10-CM

## 2020-11-26 MED ORDER — TIRZEPATIDE 5 MG/0.5ML ~~LOC~~ SOAJ: 5.0000 mg | SUBCUTANEOUS | 0 refills | Status: DC

## 2020-11-26 MED ORDER — FREESTYLE LIBRE 14 DAY SENSOR MISC: 1.0000 | 0 refills | Status: DC

## 2020-11-26 NOTE — Progress Notes (Signed)
Chief Complaint:   OBESITY Nicole Richard is here to discuss her progress with her obesity treatment plan along with follow-up of her obesity related diagnoses. Nicole Richard is on the Category 2 Plan and states she is following her eating plan approximately 85% of the time. Nicole Richard states she is walking for 30 minutes 2 times per week.  Today's visit was #: 9 Starting weight: 163 lbs Starting date: 05/13/2020 Today's weight: 159 lbs Today's date: 11/26/2020 Total lbs lost to date: 4 lbs Total lbs lost since last in-office visit: 0  Interim History: Nicole Richard's weight remains the same.   Subjective:   1. Type 2 diabetes mellitus with hyperglycemia, without long-term current use of insulin (HCC) Nicole Richard is currently taking Prandin and Mounjaro. She has occasionally low blood sugars.   2. Polyphagia Nicole Richard is taking Prandin and Mounjaro currently.   Assessment/Plan:   1. Type 2 diabetes mellitus with hyperglycemia, without long-term current use of insulin (HCC) We will refill Mounjaro 5 mg with no refills. She will cut back off the Prandin.Good blood sugar control is important to decrease the likelihood of diabetic complications such as nephropathy, neuropathy, limb loss, blindness, coronary artery disease, and death. Intensive lifestyle modification including diet, exercise and weight loss are the first line of treatment for diabetes.   - tirzepatide [facility]) 5 MG/0.5ML Pen; Inject 5 mg into the skin once a week.  Dispense: 2 mL; Refill: 0 - Continuous Blood Gluc Sensor (FREESTYLE LIBRE 14 DAY SENSOR) MISC; 1 each by Does not apply route every 14 (fourteen) days.  Dispense: 1 each; Refill: 0  2. Polyphagia Intensive lifestyle modifications are the first line treatment for this issue. We discussed several lifestyle modifications today and she will continue to work on diet, exercise and weight loss efforts. She will continue Mounjaro. She will decrease carbohydrates. She will stop Prandin due to  high blood sugars. Orders and follow up as documented in patient record.  Counseling Polyphagia is excessive hunger. Causes can include: low blood sugars, hypERthyroidism, PMS, lack of sleep, stress, insulin resistance, diabetes, certain medications, and diets that are deficient in protein and fiber.    3. Obesity with current BMI of 30.1 Nicole Richard is currently in the action stage of change. As such, her goal is to continue with weight loss efforts. She has agreed to the Category 2 Plan.   Nicole Richard will continue meal planning and intentional eating.   Exercise goals:  As is.  Behavioral modification strategies: increasing lean protein intake, decreasing simple carbohydrates, increasing vegetables, increasing water intake, decreasing eating out, no skipping meals, meal planning and cooking strategies, keeping healthy foods in the home, and planning for success.  Nicole Richard has agreed to follow-up with our clinic in 2-3 weeks. She was informed of the importance of frequent follow-up visits to maximize her success with intensive lifestyle modifications for her multiple health conditions.   Objective:   Blood pressure 123/77, pulse 71, temperature 98 F (36.7 C), height 5\' 1"  (1.549 m), weight 159 lb (72.1 kg), SpO2 (!) 9 %. Body mass index is 30.04 kg/m.  General: Cooperative, alert, well developed, in no acute distress. HEENT: Conjunctivae and lids unremarkable. Cardiovascular: Regular rhythm.  Lungs: Normal work of breathing. Neurologic: No focal deficits.   Lab Results  Component Value Date   CREATININE 0.97 10/26/2020   BUN 28 (H) 10/26/2020   NA 141 10/26/2020   K 4.8 10/26/2020   CL 104 10/26/2020   CO2 31 10/26/2020   Lab Results  Component  Value Date   ALT 31 10/26/2020   AST 20 10/26/2020   ALKPHOS 105 10/26/2020   BILITOT 0.7 10/26/2020   Lab Results  Component Value Date   HGBA1C 7.0 (H) 10/26/2020   HGBA1C 6.3 (A) 09/13/2020   HGBA1C 6.8 (A) 06/08/2020   HGBA1C 7.9  (H) 05/16/2020   HGBA1C 7.3 (A) 03/09/2020   Lab Results  Component Value Date   INSULIN 46.9 (H) 05/16/2020   Lab Results  Component Value Date   TSH 1.05 10/26/2020   Lab Results  Component Value Date   CHOL 149 10/26/2020   HDL 49.80 10/26/2020   LDLCALC 81 10/26/2020   LDLDIRECT 128.1 10/05/2013   TRIG 92.0 10/26/2020   CHOLHDL 3 10/26/2020   Lab Results  Component Value Date   VD25OH 63.69 10/26/2020   VD25OH 26.5 (L) 05/16/2020   VD25OH 41 09/14/2009   Lab Results  Component Value Date   WBC 6.6 10/26/2020   HGB 14.5 10/26/2020   HCT 43.7 10/26/2020   MCV 88.6 10/26/2020   PLT 337.0 10/26/2020   No results found for: IRON, TIBC, FERRITIN  Attestation Statements:   Reviewed by clinician on day of visit: allergies, medications, problem list, medical history, surgical history, family history, social history, and previous encounter notes.  I, Lizbeth Bark, RMA, am acting as Location manager for [employer], DO.   I have reviewed the above documentation for accuracy and completeness, and I agree with the above. Jearld Lesch, DO

## 2020-12-04 ENCOUNTER — Other Ambulatory Visit: Payer: Self-pay

## 2020-12-05 ENCOUNTER — Ambulatory Visit: Payer: BC Managed Care – PPO | Admitting: Cardiology

## 2020-12-05 ENCOUNTER — Encounter: Payer: Self-pay | Admitting: Cardiology

## 2020-12-05 ENCOUNTER — Other Ambulatory Visit: Payer: Self-pay

## 2020-12-05 VITALS — BP 136/78 | HR 74 | Ht 61.0 in | Wt 161.1 lb

## 2020-12-05 DIAGNOSIS — E119 Type 2 diabetes mellitus without complications: Secondary | ICD-10-CM | POA: Diagnosis not present

## 2020-12-05 DIAGNOSIS — E669 Obesity, unspecified: Secondary | ICD-10-CM

## 2020-12-05 DIAGNOSIS — E785 Hyperlipidemia, unspecified: Secondary | ICD-10-CM

## 2020-12-05 DIAGNOSIS — E1169 Type 2 diabetes mellitus with other specified complication: Secondary | ICD-10-CM

## 2020-12-05 DIAGNOSIS — I10 Essential (primary) hypertension: Secondary | ICD-10-CM

## 2020-12-05 NOTE — Patient Instructions (Signed)

## 2020-12-05 NOTE — Progress Notes (Signed)
` Cardiology Office Note:    Date:  12/05/2020   ID:  Nicole Richard, DOB 05-Apr-1955, MRN [id number]  PCP:  Carollee Herter, Alferd Apa, DO  Cardiologist:  Jenean Lindau, MD   Referring MD: Carollee Herter, Alferd Apa, *    ASSESSMENT:    1. Essential hypertension   2. Hyperlipidemia associated with type 2 diabetes mellitus (HCC)   3. Obesity (BMI 30-39.9)   4. Diabetes mellitus without complication (HCC)    PLAN:    In order of problems listed above:  Primary prevention stressed with the patient.  Importance of compliance with diet medication stressed and she vocalized understanding.  She was advised to walk at least half an hour a day 5 days a week and she promises to do so. Essential hypertension: Blood pressure stable and diet was emphasized.  Lifestyle modification urged. Mixed dyslipidemia: On lipid-lowering therapy.  She is a diabetic.  Lipids are fine I reviewed and discussed them with her. Diabetes mellitus and obesity: Weight reduction stressed diet was emphasized.  She is promising to do better with diet exercise and medications to get the hemoglobin A1c optimized.  This is followed by primary care. Patient will be seen in follow-up appointment in 9 months or earlier if the patient has any concerns    Medication Adjustments/Labs and Tests Ordered: Current medicines are reviewed at length with the patient today.  Concerns regarding medicines are outlined above.  No orders of the defined types were placed in this encounter.  No orders of the defined types were placed in this encounter.    No chief complaint on file.    History of Present Illness:    Nicole Richard is a 65 y.o. female.  Patient has past medical history of essential hypertension dyslipidemia and diabetes mellitus.  She denies any problems at this time and takes care of activities of daily living.  No chest pain orthopnea or PND.  Her hemoglobin A1c is 7 and she is working on diet and exercise.  She is now  diabetic.  She mentions to me that she has had 2 episodes of chest pain which were relieved with nitroglycerin.  She has not had any of the symptoms in the past 2 to 3 months.  Now she is very serious about diet and exercise.  At the time of my evaluation, the patient is alert awake oriented and in no distress.  Past Medical History:  Diagnosis Date   Abdominal pain, left upper quadrant 03/01/2013   Abnormal liver function tests 03/01/2013   Allergy    seasonal   Angina at rest [facility]) 03/21/2020   Angina pectoris ([place]) 03/21/2020   Asthma    Back pain    BREAST IMPLANTS, BILATERAL, HX OF 07/28/2007   Qualifier: Diagnosis of  By: [name]     CYSTOCELE WITH INCOMPLETE UTERINE PROLAPSE 05/18/2006   Qualifier: Diagnosis of  By: [name]     Diabetes ([place]) 01/29/2014   Diabetes mellitus without complication ([place])    Edema of both lower extremities    ESOPHAGEAL STRICTURE 11/09/2009   Qualifier: Diagnosis of  By: [name] ([place]), [name]     Essential hypertension 05/18/2006   Qualifier: Diagnosis of  By: [name]     Extrinsic asthma with exacerbation, unspecified asthma severity 10/26/2020   GERD 09/14/2009   Qualifier: Diagnosis of  By: [name]     GERD (gastroesophageal reflux disease)    HEADACHE 05/18/2006  Qualifier: Diagnosis of  By: [name]     Hirsutism 05/18/2006   Qualifier: Diagnosis of  By: [name]     Hyperlipidemia    Hyperlipidemia associated with type 2 diabetes mellitus ([place]) 04/15/2019   Hypertension    Kidney stones    LAPAROSCOPY, HX OF 05/18/2006   Qualifier: Diagnosis of  By: [name]     Mixed dyslipidemia 06/22/2020   Nonspecific (abnormal) findings on radiological and other examination of biliary tract 03/01/2013   Obesity (BMI 30-39.9) 10/18/2012   Other chest pain 03/21/2020   Palpitation 03/21/2020   Palpitations    POSTMENOPAUSAL STATUS 09/14/2009   Qualifier: Diagnosis of  By: [name]     Preventative  health care 10/26/2020   Seasonal allergic rhinitis 10/26/2020   SKIN TAG 10/05/2009   Qualifier: Diagnosis of  By: [name]     SOB (shortness of breath)    Uncontrolled type 2 diabetes mellitus with hyperglycemia ([place]) 04/15/2019   Vitamin D deficiency    Vitamin D insufficiency 05/21/2020    Past Surgical History:  Procedure Laterality Date   ABDOMINAL HYSTERECTOMY  08/2004   APPENDECTOMY     AUGMENTATION MAMMAPLASTY Bilateral 2000   BREAST ENHANCEMENT SURGERY  2001   COLONOSCOPY     LEFT HEART CATH AND CORONARY ANGIOGRAPHY N/A 04/09/2020   Procedure: LEFT HEART CATH AND CORONARY ANGIOGRAPHY;  Surgeon: Leonie Man, MD;  Location: [place] [facility];  Service: Cardiovascular;  Laterality: N/A;   POLYPECTOMY  2009   sigmoid polyps   SHOULDER ARTHROSCOPY W/ ROTATOR CUFF REPAIR Right 06/06/2015   [place]   TONSILLECTOMY     TUBAL LIGATION      Current Medications: Current Meds  Medication Sig   aspirin 81 MG chewable tablet Chew 81 mg by mouth at bedtime.   atenolol (TENORMIN) 25 MG tablet Take 25 mg by mouth daily.   atorvastatin (LIPITOR) 40 MG tablet TAKE 1 TABLET BY MOUTH EVERYDAY AT BEDTIME   bromocriptine (PARLODEL) 2.5 MG tablet Take 0.5 tablets (1.25 mg total) by mouth daily.   Calcium Carbonate-Vitamin D (OSCAL 500/200 D-3 PO) Take 1 tablet by mouth at bedtime.   Continuous Blood Gluc Sensor (FREESTYLE LIBRE 14 DAY SENSOR) MISC 1 each by Does not apply route every 14 (fourteen) days.   CRANBERRY-VITAMIN C PO Take 2 capsules by mouth at bedtime.   fenofibrate 160 MG tablet Take 1 tablet (160 mg total) by mouth daily.   Flaxseed, Linseed, (FLAXSEED OIL PO) Take 1,400 mg by mouth at bedtime.   Ipratropium-Albuterol (COMBIVENT RESPIMAT) 20-100 MCG/ACT AERS respimat Inhale 1 puff into the lungs 4 (four) times daily as needed for wheezing or shortness of breath.   mometasone (NASONEX) 50 MCG/ACT nasal spray Place 2 sprays into the nose daily.   montelukast (SINGULAIR)  10 MG tablet Take 1 tablet (10 mg total) by mouth daily.   pantoprazole (PROTONIX) 40 MG tablet Take 1 tablet (40 mg total) by mouth 2 (two) times daily.   Propylene Glycol 0.6 % SOLN Apply 1 drop to eye 4 (four) times daily as needed for dry eyes (dry/irritated eyes).   tirzepatide [facility]) 5 MG/0.5ML Pen Inject 5 mg into the skin once a week.   Vitamin D, Ergocalciferol, (DRISDOL) 1.25 MG (50000 UNIT) CAPS capsule Take 1 capsule (50,000 Units total) by mouth every 7 (seven) days.     Allergies:   Contrast media [iodinated diagnostic agents], Metamucil [psyllium], and Mustard seed  Social History   Socioeconomic History   Marital status: Divorced    Spouse name: Not on file   Number of children: 2   Years of education: Not on file   Highest education level: Not on file  Occupational History   Occupation: Nurse  Tobacco Use   Smoking status: Never   Smokeless tobacco: Never  Vaping Use   Vaping Use: Never used  Substance and Sexual Activity   Alcohol use: Yes    Comment: rare   Drug use: No   Sexual activity: Not Currently    Partners: Male  Other Topics Concern   Not on file  Social History Narrative   Buyer, retail , and pt daily   Social Determinants of Health   Financial Resource Strain: Not on file  Food Insecurity: Not on file  Transportation Needs: Not on file  Physical Activity: Not on file  Stress: Not on file  Social Connections: Not on file     Family History: The patient's family history includes Asthma in an other family member; Breast cancer in her maternal grandmother and mother; Cancer in her father; Cervical cancer in her maternal grandmother; Colon cancer in her maternal grandmother; Colon polyps in her father; Coronary artery disease in an other family member; Diabetes in her father and mother; Heart disease in her father and mother; High Cholesterol in her father and mother; High blood pressure in her father and mother; Hyperlipidemia in an  other family member; Hypertension in an other family member; Irritable bowel syndrome in her mother; Kidney disease in her father; Obesity in her father and mother; Rheumatic fever in her mother; Stroke in her mother; Sudden death in her mother; Uterine cancer in her maternal grandmother. There is no history of Esophageal cancer, Rectal cancer, or Stomach cancer.  ROS:   Please see the history of present illness.    All other systems reviewed and are negative.  EKGs/Labs/Other Studies Reviewed:    The following studies were reviewed today: I discussed my findings with the patient at length.   Recent Labs: 10/26/2020: ALT 31; BUN 28; Creatinine, Ser 0.97; Hemoglobin 14.5; Platelets 337.0; Potassium 4.8; Sodium 141; TSH 1.05  Recent Lipid Panel    Component Value Date/Time   CHOL 149 10/26/2020 0848   CHOL 158 05/16/2020 0923   TRIG 92.0 10/26/2020 0848   HDL 49.80 10/26/2020 0848   HDL 47 05/16/2020 0923   CHOLHDL 3 10/26/2020 0848   VLDL 18.4 10/26/2020 0848   LDLCALC 81 10/26/2020 0848   LDLCALC 90 05/16/2020 0923   LDLCALC 113 (H) 10/21/2019 0912   LDLDIRECT 128.1 10/05/2013 0803    Physical Exam:    VS:  BP 136/78   Pulse 74   Ht 5\' 1"  (1.549 m)   Wt 161 lb 1.9 oz (73.1 kg)   SpO2 97%   BMI 30.44 kg/m     Wt Readings from Last 3 Encounters:  12/05/20 161 lb 1.9 oz (73.1 kg)  11/26/20 159 lb (72.1 kg)  10/29/20 159 lb (72.1 kg)     GEN: Patient is in no acute distress HEENT: Normal NECK: No JVD; No carotid bruits LYMPHATICS: No lymphadenopathy CARDIAC: Hear sounds regular, 2/6 systolic murmur at the apex. RESPIRATORY:  Clear to auscultation without rales, wheezing or rhonchi  ABDOMEN: Soft, non-tender, non-distended MUSCULOSKELETAL:  No edema; No deformity  SKIN: Warm and dry NEUROLOGIC:  Alert and oriented x 3 PSYCHIATRIC:  Normal affect   Signed, Jenean Lindau, MD  12/05/2020 9:09  AM    [place]

## 2020-12-18 ENCOUNTER — Ambulatory Visit (INDEPENDENT_AMBULATORY_CARE_PROVIDER_SITE_OTHER): Payer: BC Managed Care – PPO | Admitting: Family Medicine

## 2020-12-18 ENCOUNTER — Encounter (INDEPENDENT_AMBULATORY_CARE_PROVIDER_SITE_OTHER): Payer: Self-pay | Admitting: Family Medicine

## 2020-12-18 ENCOUNTER — Other Ambulatory Visit: Payer: Self-pay

## 2020-12-18 ENCOUNTER — Ambulatory Visit: Payer: BC Managed Care – PPO | Admitting: Endocrinology

## 2020-12-18 VITALS — BP 148/80 | HR 84 | Ht 61.0 in | Wt 162.6 lb

## 2020-12-18 VITALS — BP 131/79 | HR 78 | Temp 98.3°F | Ht 61.0 in | Wt 158.0 lb

## 2020-12-18 DIAGNOSIS — E785 Hyperlipidemia, unspecified: Secondary | ICD-10-CM

## 2020-12-18 DIAGNOSIS — E119 Type 2 diabetes mellitus without complications: Secondary | ICD-10-CM

## 2020-12-18 DIAGNOSIS — E1165 Type 2 diabetes mellitus with hyperglycemia: Secondary | ICD-10-CM

## 2020-12-18 DIAGNOSIS — Z6831 Body mass index (BMI) 31.0-31.9, adult: Secondary | ICD-10-CM

## 2020-12-18 DIAGNOSIS — E1169 Type 2 diabetes mellitus with other specified complication: Secondary | ICD-10-CM | POA: Diagnosis not present

## 2020-12-18 DIAGNOSIS — E669 Obesity, unspecified: Secondary | ICD-10-CM

## 2020-12-18 LAB — POCT GLYCOSYLATED HEMOGLOBIN (HGB A1C): Hemoglobin A1C: 6.5 % — AB (ref 4.0–5.6)

## 2020-12-18 MED ORDER — FREESTYLE LIBRE 14 DAY SENSOR MISC: 1.0000 | 3 refills | Status: DC

## 2020-12-18 MED ORDER — TIRZEPATIDE 7.5 MG/0.5ML ~~LOC~~ SOAJ: 7.5000 mg | SUBCUTANEOUS | 3 refills | Status: DC

## 2020-12-18 NOTE — Patient Instructions (Addendum)
I have sent a prescription to your pharmacy, to increase the [facility].   You can stop the bromocriptine.   check your blood sugar once a day.  vary the time of day when you check, between before the 3 meals, and at bedtime.  also check if you have symptoms of your blood sugar being too high or too low.  please keep a record of the readings and bring it to your next appointment here (or you can bring the meter itself).  You can write it on any piece of paper.  please call us sooner if your blood sugar goes below 70, or if most of your readings are over 200.   Please come back for a follow-up appointment in 3 months.

## 2020-12-18 NOTE — Progress Notes (Signed)
Chief Complaint:   OBESITY Nicole Richard is here to discuss her progress with her obesity treatment plan along with follow-up of her obesity related diagnoses. Nicole Richard is on the Category 2 Plan and states she is following her eating plan approximately 75% of the time. Nicole Richard states she is walking 30 minutes 2 times per week.  Today's visit was #: 10 Starting weight: 163 lbs Starting date: 05/13/2020 Today's weight: 158 lbs Today's date: 12/18/2020 Total lbs lost to date: 5 Total lbs lost since last in-office visit: 1  Interim History: She is eating 6-8 oz meat at dinner and vegetables. She is trying to limit carbs to about 60 grams per day. She is substituting cauliflower rice for rice mashed cauliflower for potatoes. Pt has a work party in the next few weeks. Increase in Mounjaro to 7.5 mg by Dr. Loanne Drilling.  Subjective:   1. Type 2 diabetes mellitus with hyperglycemia, without long-term current use of insulin (HCC) Nicole Richard is on Mounjaro 7.5 mg (hasn't picked it up yet). Her last A1c was 6.5 insulin 24.4.  2. Hyperlipidemia associated with type 2 diabetes mellitus ([place]) Pt's last LDL was 81, HDL 49.8, and triglycerides 92. She is on Lipitor.  Assessment/Plan:   1. Type 2 diabetes mellitus with hyperglycemia, without long-term current use of insulin (HCC) Good blood sugar control is important to decrease the likelihood of diabetic complications such as nephropathy, neuropathy, limb loss, blindness, coronary artery disease, and death. Intensive lifestyle modification including diet, exercise and weight loss are the first line of treatment for diabetes. Follow up at next appt.  2. Hyperlipidemia associated with type 2 diabetes mellitus ([place]) Cardiovascular risk and specific lipid/LDL goals reviewed.  We discussed several lifestyle modifications today and Nicole Richard will continue to work on diet, exercise and weight loss efforts. Orders and follow up as documented in patient record. Repeat labs in  February 2023.  Counseling Intensive lifestyle modifications are the first line treatment for this issue. Dietary changes: Increase soluble fiber. Decrease simple carbohydrates. Exercise changes: Moderate to vigorous-intensity aerobic activity 150 minutes per week if tolerated. Lipid-lowering medications: see documented in medical record.  3. Obesity BMI today is 65  Sally is currently in the action stage of change. As such, her goal is to continue with weight loss efforts. She has agreed to the Category 2 Plan + 100 calories.   Exercise goals: All adults should avoid inactivity. Some physical activity is better than none, and adults who participate in any amount of physical activity gain some health benefits. Pt encouraged to start resistance training 10-15 minutes.  Behavioral modification strategies: increasing lean protein intake, meal planning and cooking strategies, keeping healthy foods in the home, and planning for success.  Nicole Richard has agreed to follow-up with our clinic in 3 weeks. She was informed of the importance of frequent follow-up visits to maximize her success with intensive lifestyle modifications for her multiple health conditions.   Objective:   Blood pressure 131/79, pulse 78, temperature 98.3 F (36.8 C), height 5\' 1"  (1.549 m), weight 158 lb (71.7 kg), SpO2 96 %. Body mass index is 29.85 kg/m.  General: Cooperative, alert, well developed, in no acute distress. HEENT: Conjunctivae and lids unremarkable. Cardiovascular: Regular rhythm.  Lungs: Normal work of breathing. Neurologic: No focal deficits.   Lab Results  Component Value Date   CREATININE 0.97 10/26/2020   BUN 28 (H) 10/26/2020   NA 141 10/26/2020   K 4.8 10/26/2020   CL 104 10/26/2020   CO2  31 10/26/2020   Lab Results  Component Value Date   ALT 31 10/26/2020   AST 20 10/26/2020   ALKPHOS 105 10/26/2020   BILITOT 0.7 10/26/2020   Lab Results  Component Value Date   HGBA1C 6.5 (A)  12/18/2020   HGBA1C 7.0 (H) 10/26/2020   HGBA1C 6.3 (A) 09/13/2020   HGBA1C 6.8 (A) 06/08/2020   HGBA1C 7.9 (H) 05/16/2020   Lab Results  Component Value Date   INSULIN 46.9 (H) 05/16/2020   Lab Results  Component Value Date   TSH 1.05 10/26/2020   Lab Results  Component Value Date   CHOL 149 10/26/2020   HDL 49.80 10/26/2020   LDLCALC 81 10/26/2020   LDLDIRECT 128.1 10/05/2013   TRIG 92.0 10/26/2020   CHOLHDL 3 10/26/2020   Lab Results  Component Value Date   VD25OH 63.69 10/26/2020   VD25OH 26.5 (L) 05/16/2020   VD25OH 41 09/14/2009   Lab Results  Component Value Date   WBC 6.6 10/26/2020   HGB 14.5 10/26/2020   HCT 43.7 10/26/2020   MCV 88.6 10/26/2020   PLT 337.0 10/26/2020   Attestation Statements:   Reviewed by clinician on day of visit: allergies, medications, problem list, medical history, surgical history, family history, social history, and previous encounter notes.  Coral Ceo, CMA, am acting as transcriptionist for Coralie Common, MD.   I have reviewed the above documentation for accuracy and completeness, and I agree with the above. - Coralie Common, MD

## 2020-12-18 NOTE — Progress Notes (Signed)
Subjective:    Patient ID: Nicole Richard, female    DOB: [birth year], 65 y.o.   MRN: [id number]  HPI Pt returns for f/u of diabetes mellitus:  DM type: 2 Dx'ed: 6967 Complications: CAD and PN Therapy: 3 oral meds.  GDM: never DKA: never Severe hypoglycemia: never.  Pancreatitis: never.  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   Interval history: She takes meds as rx'ed.  pt states she feels well in general.  She stopped repaglinide, due to hypoglycemia.  BP is normal at home.  I reviewed continuous glucose monitor data.  Glucose varies from 69-180.  It is in general highest at 9PM-11PM, and lowest at 12N.  However, There is little trend throughout the day. Past Medical History:  Diagnosis Date   Abdominal pain, left upper quadrant 03/01/2013   Abnormal liver function tests 03/01/2013   Allergy    seasonal   Angina at rest [facility]) 03/21/2020   Angina pectoris ([place]) 03/21/2020   Asthma    Back pain    BREAST IMPLANTS, BILATERAL, HX OF 07/28/2007   Qualifier: Diagnosis of  By: [name]     CYSTOCELE WITH INCOMPLETE UTERINE PROLAPSE 05/18/2006   Qualifier: Diagnosis of  By: [name]     Diabetes ([place]) 01/29/2014   Diabetes mellitus without complication ([place])    Edema of both lower extremities    ESOPHAGEAL STRICTURE 11/09/2009   Qualifier: Diagnosis of  By: [name] ([place]), [name]     Essential hypertension 05/18/2006   Qualifier: Diagnosis of  By: [name]     Extrinsic asthma with exacerbation, unspecified asthma severity 10/26/2020   GERD 09/14/2009   Qualifier: Diagnosis of  By: [name]     GERD (gastroesophageal reflux disease)    HEADACHE 05/18/2006   Qualifier: Diagnosis of  By: [name]     Hirsutism 05/18/2006   Qualifier: Diagnosis of  By: [name]     Hyperlipidemia    Hyperlipidemia associated with type 2 diabetes mellitus  ([place]) 04/15/2019   Hypertension    Kidney stones    LAPAROSCOPY, HX OF 05/18/2006   Qualifier: Diagnosis of  By: [name]     Mixed dyslipidemia 06/22/2020   Nonspecific (abnormal) findings on radiological and other examination of biliary tract 03/01/2013   Obesity (BMI 30-39.9) 10/18/2012   Other chest pain 03/21/2020   Palpitation 03/21/2020   Palpitations    POSTMENOPAUSAL STATUS 09/14/2009   Qualifier: Diagnosis of  By: [name]     Preventative health care 10/26/2020   Seasonal allergic rhinitis 10/26/2020   SKIN TAG 10/05/2009   Qualifier: Diagnosis of  By: [name]     SOB (shortness of breath)    Uncontrolled type 2 diabetes mellitus with hyperglycemia ([place]) 04/15/2019   Vitamin D deficiency    Vitamin D insufficiency 05/21/2020    Past Surgical History:  Procedure Laterality Date   ABDOMINAL HYSTERECTOMY  08/2004   APPENDECTOMY     AUGMENTATION MAMMAPLASTY Bilateral 2000   BREAST ENHANCEMENT SURGERY  2001   COLONOSCOPY     LEFT HEART CATH AND CORONARY ANGIOGRAPHY N/A 04/09/2020   Procedure: LEFT HEART CATH AND CORONARY ANGIOGRAPHY;  Surgeon: Leonie Man, MD;  Location: [place] [facility];  Service: Cardiovascular;  Laterality: N/A;   POLYPECTOMY  2009   sigmoid polyps  SHOULDER ARTHROSCOPY W/ ROTATOR CUFF REPAIR Right 06/06/2015   [facility]   TONSILLECTOMY     TUBAL LIGATION      Social History   Socioeconomic History   Marital status: Divorced    Spouse name: Not on file   Number of children: 2   Years of education: Not on file   Highest education level: Not on file  Occupational History   Occupation: Nurse  Tobacco Use   Smoking status: Never   Smokeless tobacco: Never  Vaping Use   Vaping Use: Never used  Substance and Sexual Activity   Alcohol use: Yes    Comment: rare   Drug use: No   Sexual activity: Not Currently    Partners: Male  Other Topics Concern   Not on file  Social History Narrative   Buyer, retail , and pt daily    Social Determinants of Health   Financial Resource Strain: Not on file  Food Insecurity: Not on file  Transportation Needs: Not on file  Physical Activity: Not on file  Stress: Not on file  Social Connections: Not on file  Intimate Partner Violence: Not on file    Current Outpatient Medications on File Prior to Visit  Medication Sig Dispense Refill   aspirin 81 MG chewable tablet Chew 81 mg by mouth at bedtime.     atenolol (TENORMIN) 25 MG tablet Take 25 mg by mouth daily.     atorvastatin (LIPITOR) 40 MG tablet TAKE 1 TABLET BY MOUTH EVERYDAY AT BEDTIME 90 tablet 1   Calcium Carbonate-Vitamin D (OSCAL 500/200 D-3 PO) Take 1 tablet by mouth at bedtime.     CRANBERRY-VITAMIN C PO Take 2 capsules by mouth at bedtime.     fenofibrate 160 MG tablet Take 1 tablet (160 mg total) by mouth daily. 90 tablet 1   Flaxseed, Linseed, (FLAXSEED OIL PO) Take 1,400 mg by mouth at bedtime.     Ipratropium-Albuterol (COMBIVENT RESPIMAT) 20-100 MCG/ACT AERS respimat Inhale 1 puff into the lungs 4 (four) times daily as needed for wheezing or shortness of breath. 4 g 5   mometasone (NASONEX) 50 MCG/ACT nasal spray Place 2 sprays into the nose daily.     montelukast (SINGULAIR) 10 MG tablet Take 1 tablet (10 mg total) by mouth daily. 90 tablet 3   pantoprazole (PROTONIX) 40 MG tablet Take 1 tablet (40 mg total) by mouth 2 (two) times daily. 180 tablet 3   Propylene Glycol 0.6 % SOLN Apply 1 drop to eye 4 (four) times daily as needed for dry eyes (dry/irritated eyes).     Vitamin D, Ergocalciferol, (DRISDOL) 1.25 MG (50000 UNIT) CAPS capsule Take 1 capsule (50,000 Units total) by mouth every 7 (seven) days. 12 capsule 1   nitroGLYCERIN (NITROSTAT) 0.4 MG SL tablet Place 1 tablet (0.4 mg total) under the tongue every 5 (five) minutes as needed for chest pain. 25 tablet 3   No current facility-administered medications on file prior to visit.    Allergies  Allergen Reactions   Contrast Media [Iodinated  Diagnostic Agents] Anaphylaxis   Metamucil [Psyllium] Anaphylaxis   Mustard Seed Anaphylaxis    Family History  Problem Relation Age of Onset   Rheumatic fever Mother    Irritable bowel syndrome Mother    Breast cancer Mother    Diabetes Mother    High blood pressure Mother    High Cholesterol Mother    Heart disease Mother    Stroke Mother    Sudden death Mother  Obesity Mother    Cancer Father    Diabetes Father    Colon polyps Father    Kidney disease Father    High blood pressure Father    High Cholesterol Father    Heart disease Father    Obesity Father    Cervical cancer Maternal Grandmother    Breast cancer Maternal Grandmother    Colon cancer Maternal Grandmother    Uterine cancer Maternal Grandmother    Coronary artery disease Other    Hyperlipidemia Other    Hypertension Other    Asthma Other    Esophageal cancer Neg Hx    Rectal cancer Neg Hx    Stomach cancer Neg Hx     BP (!) 148/80   Pulse 84   Ht 5\' 1"  (1.549 m)   Wt 162 lb 9.6 oz (73.8 kg)   SpO2 97%   BMI 30.72 kg/m    Review of Systems     Objective:   Physical Exam VITAL SIGNS:  See vs page GENERAL: no distress Pulses: dorsalis pedis intact bilat.   MSK: no deformity of the feet CV: no leg edema Skin:  no ulcer on the feet.  normal color and temp on the feet. Neuro: sensation is intact to touch on the feet, but decreased from normal.   Ext: there is bilateral onychomycosis of the toenails   Lab Results  Component Value Date   HGBA1C 6.5 (A) 12/18/2020      Assessment & Plan:  Type 2 DM: uncontrolled, as A1c goal is 6.0%  Patient Instructions  I have sent a prescription to your pharmacy, to increase the [facility].   You can stop the bromocriptine.   check your blood sugar once a day.  vary the time of day when you check, between before the 3 meals, and at bedtime.  also check if you have symptoms of your blood sugar being too high or too low.  please keep a record of the  readings and bring it to your next appointment here (or you can bring the meter itself).  You can write it on any piece of paper.  please call us sooner if your blood sugar goes below 70, or if most of your readings are over 200.   Please come back for a follow-up appointment in 3 months.

## 2020-12-31 ENCOUNTER — Other Ambulatory Visit: Payer: Self-pay | Admitting: Endocrinology

## 2021-01-08 ENCOUNTER — Encounter: Payer: Self-pay | Admitting: Endocrinology

## 2021-01-09 ENCOUNTER — Ambulatory Visit (INDEPENDENT_AMBULATORY_CARE_PROVIDER_SITE_OTHER): Payer: BC Managed Care – PPO | Admitting: Family Medicine

## 2021-01-09 ENCOUNTER — Encounter (INDEPENDENT_AMBULATORY_CARE_PROVIDER_SITE_OTHER): Payer: Self-pay | Admitting: Family Medicine

## 2021-01-09 ENCOUNTER — Other Ambulatory Visit: Payer: Self-pay

## 2021-01-09 ENCOUNTER — Other Ambulatory Visit (INDEPENDENT_AMBULATORY_CARE_PROVIDER_SITE_OTHER): Payer: Self-pay | Admitting: Family Medicine

## 2021-01-09 VITALS — BP 135/76 | HR 66 | Temp 98.4°F | Ht 61.0 in | Wt 159.0 lb

## 2021-01-09 DIAGNOSIS — E1169 Type 2 diabetes mellitus with other specified complication: Secondary | ICD-10-CM

## 2021-01-09 DIAGNOSIS — E1165 Type 2 diabetes mellitus with hyperglycemia: Secondary | ICD-10-CM | POA: Diagnosis not present

## 2021-01-09 DIAGNOSIS — E785 Hyperlipidemia, unspecified: Secondary | ICD-10-CM

## 2021-01-09 DIAGNOSIS — Z6831 Body mass index (BMI) 31.0-31.9, adult: Secondary | ICD-10-CM

## 2021-01-09 DIAGNOSIS — E669 Obesity, unspecified: Secondary | ICD-10-CM

## 2021-01-09 MED ORDER — TIRZEPATIDE 10 MG/0.5ML ~~LOC~~ SOAJ: 10.0000 mg | SUBCUTANEOUS | 0 refills | Status: DC

## 2021-01-09 MED ORDER — ONDANSETRON HCL 8 MG PO TABS: 8.0000 mg | ORAL_TABLET | Freq: Three times a day (TID) | ORAL | 0 refills | Status: DC | PRN

## 2021-01-09 NOTE — Progress Notes (Signed)
Chief Complaint:   OBESITY Nicole Richard is here to discuss her progress with her obesity treatment plan along with follow-up of her obesity related diagnoses. Kaydince is on the Category 2 Plan plus 100 calories and states she is following her eating plan approximately 25% of the time. Conleigh states she is doing 0 minutes 0 times per week.  Today's visit was #: 11 Starting weight: 163 lbs Starting date: 05/13/2020 Today's weight: 159 lbs Today's date: 01/09/2021 Total lbs lost to date: 4 lbs Total lbs lost since last in-office visit: 0  Interim History: Nicole Richard is on plan fairly well at breakfast and lunch. However, she often eats fast food at dinner. She tries to meal prep on weekends but sometimes work 6-7 days per week. She is a Marine scientist.  Subjective:   1. Type 2 diabetes mellitus with hyperglycemia, without long-term current use of insulin ([place]) DM is well controlled. Nicole Richard has been on bromocriptine and repaglinide. She has been unable to obtain 7.5 mg of Mounjaro due to [employer]. She has not had hypoglycemia.    Lab Results  Component Value Date   HGBA1C 6.5 (A) 12/18/2020   HGBA1C 7.0 (H) 10/26/2020   HGBA1C 6.3 (A) 09/13/2020   Lab Results  Component Value Date   MICROALBUR <0.7 10/26/2020   LDLCALC 81 10/26/2020   CREATININE 0.97 10/26/2020   Lab Results  Component Value Date   INSULIN 46.9 (H) 05/16/2020    2. Hyperlipidemia associated with type 2 diabetes mellitus ([place]) LDL at goal. Nicole Richard is currently on Zetia and Lipitor.   Lab Results  Component Value Date   CHOL 149 10/26/2020   HDL 49.80 10/26/2020   LDLCALC 81 10/26/2020   LDLDIRECT 128.1 10/05/2013   TRIG 92.0 10/26/2020   CHOLHDL 3 10/26/2020   Lab Results  Component Value Date   ALT 31 10/26/2020   AST 20 10/26/2020   ALKPHOS 105 10/26/2020   BILITOT 0.7 10/26/2020   The 10-year ASCVD risk score (Arnett DK, et al., 2019) is: 11.1%   Values used to calculate the score:     Age: 65  years     Sex: Female     Is Non-Hispanic African American: No     Diabetic: Yes     Tobacco smoker: No     Systolic Blood Pressure: 759 mmHg     Is BP treated: Yes     HDL Cholesterol: 49.8 mg/dL     Total Cholesterol: 149 mg/dL  Assessment/Plan:   1. Type 2 diabetes mellitus with hyperglycemia, without long-term current use of insulin ([place]) She [facility] out [facility] 7[facility]5 after I sent the 10 mg. She will start 7.5 mg and then increase to 10 mg next month.  She will stop bromocriptine and repaglinide once she restarts Mounjaro.  - tirzepatide (MOUNJARO) 10 MG/0.5ML Pen; Inject 10 mg into the skin once a week.  Dispense: 2 mL; Refill: 0 - ondansetron (ZOFRAN) 8 MG tablet; Take 1 tablet (8 mg total) by mouth every 8 (eight) hours as needed for nausea or vomiting.  Dispense: 20 tablet; Refill: 0  2. Hyperlipidemia associated with type 2 diabetes mellitus ([place])  Larah will continue Zetia and Lipitor. Orders and follow up as documented in patient record.    3. Obesity BMI today is 30.06 Nicole Richard is currently in the action stage of change. As such, her goal is to continue with weight loss efforts. She has agreed to keeping a food journal and  adhering to recommended goals of 400-500 calories and 35 grams of protein daily.   Handouts: Eating out was provided today.  Exercise goals: No exercise has been prescribed at this time.  Behavioral modification strategies: decreasing simple carbohydrates, increasing water intake, and decreasing eating out.  Analia has agreed to follow-up with our clinic in 2-3 weeks with Dr. Owens Shark. s.   Objective:   Blood pressure 135/76, pulse 66, temperature 98.4 F (36.9 C), height 5\' 1"  (1.549 m), weight 159 lb (72.1 kg), SpO2 98 %. Body mass index is 30.04 kg/m.  General: Cooperative, alert, well developed, in no acute distress. HEENT: Conjunctivae and lids unremarkable. Cardiovascular: Regular rhythm.  Lungs: Normal work of  breathing. Neurologic: No focal deficits.   Lab Results  Component Value Date   CREATININE 0.97 10/26/2020   BUN 28 (H) 10/26/2020   NA 141 10/26/2020   K 4.8 10/26/2020   CL 104 10/26/2020   CO2 31 10/26/2020   Lab Results  Component Value Date   ALT 31 10/26/2020   AST 20 10/26/2020   ALKPHOS 105 10/26/2020   BILITOT 0.7 10/26/2020   Lab Results  Component Value Date   HGBA1C 6.5 (A) 12/18/2020   HGBA1C 7.0 (H) 10/26/2020   HGBA1C 6.3 (A) 09/13/2020   HGBA1C 6.8 (A) 06/08/2020   HGBA1C 7.9 (H) 05/16/2020   Lab Results  Component Value Date   INSULIN 46.9 (H) 05/16/2020   Lab Results  Component Value Date   TSH 1.05 10/26/2020   Lab Results  Component Value Date   CHOL 149 10/26/2020   HDL 49.80 10/26/2020   LDLCALC 81 10/26/2020   LDLDIRECT 128.1 10/05/2013   TRIG 92.0 10/26/2020   CHOLHDL 3 10/26/2020   Lab Results  Component Value Date   VD25OH 63.69 10/26/2020   VD25OH 26.5 (L) 05/16/2020   VD25OH 41 09/14/2009   Lab Results  Component Value Date   WBC 6.6 10/26/2020   HGB 14.5 10/26/2020   HCT 43.7 10/26/2020   MCV 88.6 10/26/2020   PLT 337.0 10/26/2020   No results found for: IRON, TIBC, FERRITIN  Attestation Statements:   Reviewed by clinician on day of visit: allergies, medications, problem list, medical history, surgical history, family history, social history, and previous encounter notes.   I, Lizbeth Bark, RMA, am acting as Location manager for [employer], [place].  I have reviewed the above documentation for accuracy and completeness, and I agree with the above. -  Georgianne Fick, FNP

## 2021-02-11 ENCOUNTER — Ambulatory Visit (INDEPENDENT_AMBULATORY_CARE_PROVIDER_SITE_OTHER): Payer: BC Managed Care – PPO | Admitting: Bariatrics

## 2021-02-11 ENCOUNTER — Encounter (INDEPENDENT_AMBULATORY_CARE_PROVIDER_SITE_OTHER): Payer: Self-pay | Admitting: Bariatrics

## 2021-02-11 ENCOUNTER — Encounter: Payer: Self-pay | Admitting: Family Medicine

## 2021-02-11 ENCOUNTER — Other Ambulatory Visit: Payer: Self-pay

## 2021-02-11 VITALS — BP 124/75 | HR 94 | Temp 98.6°F | Ht 61.0 in | Wt 155.0 lb

## 2021-02-11 DIAGNOSIS — Z6829 Body mass index (BMI) 29.0-29.9, adult: Secondary | ICD-10-CM

## 2021-02-11 DIAGNOSIS — E1165 Type 2 diabetes mellitus with hyperglycemia: Secondary | ICD-10-CM | POA: Diagnosis not present

## 2021-02-11 DIAGNOSIS — E785 Hyperlipidemia, unspecified: Secondary | ICD-10-CM

## 2021-02-11 DIAGNOSIS — K219 Gastro-esophageal reflux disease without esophagitis: Secondary | ICD-10-CM

## 2021-02-11 DIAGNOSIS — E669 Obesity, unspecified: Secondary | ICD-10-CM

## 2021-02-11 DIAGNOSIS — E1169 Type 2 diabetes mellitus with other specified complication: Secondary | ICD-10-CM | POA: Diagnosis not present

## 2021-02-11 NOTE — Progress Notes (Signed)
Chief Complaint:   OBESITY Nicole Richard is here to discuss her progress with her obesity treatment plan along with follow-up of her obesity related diagnoses. Nicole Richard is on the Category 2 Plan and states she is following her eating plan approximately 50% of the time. Nicole Richard states she is doing 0 minutes 0 times per week.  Today's visit was #: 12 Starting weight: 163 lbs Starting date: 05/13/2020 Today's weight: 155 lbs Today's date: 02/11/2021 Total lbs lost to date: 8 lbs Total lbs lost since last in-office visit: 4 lbs  Interim History: Nicole Richard is down an additional 4 lbs since her last visit. She struggled during the holidays. She is drinking enough water.  Subjective:   1. Hyperlipidemia associated with type 2 diabetes mellitus (Nicole Richard) Nicole Richard is currently taking Lipitor and fenofibrate.  2. Gastroesophageal reflux disease, unspecified whether esophagitis present Nicole Richard is taking Protonix currently.  3. Type 2 diabetes mellitus with hyperglycemia, without long-term current use of insulin ([facility]) Nicole Richard is taking Mounjaro 7.5 mg currently. She will add Bromocriptine.  Assessment/Plan:   1. Hyperlipidemia associated with type 2 diabetes mellitus ([place]) Cardiovascular risk and specific lipid/LDL goals reviewed. Nicole Richard will continue taking Lipitor. We discussed several lifestyle modifications today and Nicole Richard will continue to work on diet, exercise and weight loss efforts. Orders and follow up as documented in patient record.   Counseling Intensive lifestyle modifications are the first line treatment for this issue. Dietary changes: Increase soluble fiber. Decrease simple carbohydrates. Exercise changes: Moderate to vigorous-intensity aerobic activity 150 minutes per week if tolerated. Lipid-lowering medications: see documented in medical record.  2. Gastroesophageal reflux disease, unspecified whether esophagitis present Intensive lifestyle modifications are the first line treatment  for this issue. Nicole Richard will continue taking Protonix. We discussed several lifestyle modifications today and she will continue to work on diet, exercise and weight loss efforts. Orders and follow up as documented in patient record.   Counseling If a person has gastroesophageal reflux disease (GERD), food and stomach acid move back up into the esophagus and cause symptoms or problems such as damage to the esophagus. Anti-reflux measures include: raising the head of the bed, avoiding tight clothing or belts, avoiding eating late at night, not lying down shortly after mealtime, and achieving weight loss. Avoid ASA, NSAID's, caffeine, alcohol, and tobacco.  OTC Pepcid and/or Tums are often very helpful for as needed use.  However, for persisting chronic or daily symptoms, stronger medications like Omeprazole may be needed. You may need to avoid foods and drinks such as: Coffee and tea (with or without caffeine). Drinks that contain alcohol. Energy drinks and sports drinks. Bubbly (carbonated) drinks or sodas. Chocolate and cocoa. Peppermint and mint flavorings. Garlic and onions. Horseradish. Spicy and acidic foods. These include peppers, chili powder, curry powder, vinegar, hot sauces, and BBQ sauce. Citrus fruit juices and citrus fruits, such as oranges, lemons, and limes. Tomato-based foods. These include red sauce, chili, salsa, and pizza with red sauce. Fried and fatty foods. These include donuts, french fries, potato chips, and high-fat dressings. High-fat meats. These include hot dogs, rib eye steak, sausage, ham, and bacon.   3. Type 2 diabetes mellitus with hyperglycemia, without long-term current use of insulin ([place]) Nicole Richard will continue taking her medication. Good blood sugar control is important to decrease the likelihood of diabetic complications such as nephropathy, neuropathy, limb loss, blindness, coronary artery disease, and death. Intensive lifestyle modification including diet,  exercise and weight loss are the first line of treatment for diabetes.  4. Obesity BMI today is 29.4 Nicole Richard is currently in the action stage of change. As such, her goal is to continue with weight loss efforts. She has agreed to the Category 2 Plan.   Nicole Richard will adhere closely to her plan 80-90%. She will adjust portion sizes.  Exercise goals: Nicole Richard will start chair exercise or get up between commercials if watching TV.   Behavioral modification strategies: increasing lean protein intake, decreasing simple carbohydrates, increasing vegetables, increasing water intake, decreasing eating out, no skipping meals, meal planning and cooking strategies, keeping healthy foods in the home, and planning for success.  Nicole Richard has agreed to follow-up with our clinic in 3 weeks. She was informed of the importance of frequent follow-up visits to maximize her success with intensive lifestyle modifications for her multiple health conditions.   Objective:   Blood pressure 124/75, pulse 94, temperature 98.6 F (37 C), height 5\' 1"  (1.549 m), weight 155 lb (70.3 kg), SpO2 97 %. Body mass index is 29.29 kg/m.  General: Cooperative, alert, well developed, in no acute distress. HEENT: Conjunctivae and lids unremarkable. Cardiovascular: Regular rhythm.  Lungs: Normal work of breathing. Neurologic: No focal deficits.   Lab Results  Component Value Date   CREATININE 0.97 10/26/2020   BUN 28 (H) 10/26/2020   NA 141 10/26/2020   K 4.8 10/26/2020   CL 104 10/26/2020   CO2 31 10/26/2020   Lab Results  Component Value Date   ALT 31 10/26/2020   AST 20 10/26/2020   ALKPHOS 105 10/26/2020   BILITOT 0.7 10/26/2020   Lab Results  Component Value Date   HGBA1C 6.5 (A) 12/18/2020   HGBA1C 7.0 (H) 10/26/2020   HGBA1C 6.3 (A) 09/13/2020   HGBA1C 6.8 (A) 06/08/2020   HGBA1C 7.9 (H) 05/16/2020   Lab Results  Component Value Date   INSULIN 46.9 (H) 05/16/2020   Lab Results  Component Value Date    TSH 1.05 10/26/2020   Lab Results  Component Value Date   CHOL 149 10/26/2020   HDL 49.80 10/26/2020   LDLCALC 81 10/26/2020   LDLDIRECT 128.1 10/05/2013   TRIG 92.0 10/26/2020   CHOLHDL 3 10/26/2020   Lab Results  Component Value Date   VD25OH 63.69 10/26/2020   VD25OH 26.5 (L) 05/16/2020   VD25OH 41 09/14/2009   Lab Results  Component Value Date   WBC 6.6 10/26/2020   HGB 14.5 10/26/2020   HCT 43.7 10/26/2020   MCV 88.6 10/26/2020   PLT 337.0 10/26/2020   No results found for: IRON, TIBC, FERRITIN  Attestation Statements:   Reviewed by clinician on day of visit: allergies, medications, problem list, medical history, surgical history, family history, social history, and previous encounter notes.  I, Lizbeth Bark, RMA, am acting as Location manager for [employer], [place].  I have reviewed the above documentation for accuracy and completeness, and I agree with the above. Jearld Lesch, DO

## 2021-02-12 ENCOUNTER — Encounter: Payer: Self-pay | Admitting: Family

## 2021-02-12 ENCOUNTER — Encounter (INDEPENDENT_AMBULATORY_CARE_PROVIDER_SITE_OTHER): Payer: Self-pay | Admitting: Bariatrics

## 2021-02-12 ENCOUNTER — Telehealth (INDEPENDENT_AMBULATORY_CARE_PROVIDER_SITE_OTHER): Payer: BC Managed Care – PPO | Admitting: Family

## 2021-02-12 VITALS — Temp 97.1°F | Ht 61.0 in | Wt 155.0 lb

## 2021-02-12 DIAGNOSIS — U071 COVID-19: Secondary | ICD-10-CM

## 2021-02-12 MED ORDER — BENZONATATE 100 MG PO CAPS: 100.0000 mg | ORAL_CAPSULE | Freq: Three times a day (TID) | ORAL | 0 refills | Status: DC | PRN

## 2021-02-12 MED ORDER — MOLNUPIRAVIR EUA 200MG CAPSULE: 4.0000 | ORAL_CAPSULE | Freq: Two times a day (BID) | ORAL | 0 refills | Status: AC

## 2021-02-12 NOTE — Telephone Encounter (Signed)
Please review

## 2021-02-12 NOTE — Progress Notes (Signed)
Nicole Richard is a 66 y.o. female with the following history as recorded in EpicCare:  Patient Active Problem List   Diagnosis Date Noted   Preventative health care 10/26/2020   Seasonal allergic rhinitis 10/26/2020   Extrinsic asthma with exacerbation, unspecified asthma severity 10/26/2020   Mixed dyslipidemia 06/22/2020   Back pain    Edema of both lower extremities    Hyperlipidemia    Hypertension    Kidney stones    Palpitations    SOB (shortness of breath)    Vitamin D deficiency    Vitamin D insufficiency 05/21/2020   Allergy    Asthma    Diabetes mellitus without complication (HCC)    GERD (gastroesophageal reflux disease)    Angina at rest [facility]) 03/21/2020   Palpitation 03/21/2020   Other chest pain 03/21/2020   Angina pectoris ([place]) 03/21/2020   Hyperlipidemia associated with type 2 diabetes mellitus ([place]) 04/15/2019   Uncontrolled type 2 diabetes mellitus with hyperglycemia ([place]) 04/15/2019   Diabetes ([place]) 01/29/2014   Abdominal pain, left upper quadrant 03/01/2013   Nonspecific (abnormal) findings on radiological and other examination of biliary tract 03/01/2013   Abnormal liver function tests 03/01/2013   Class 1 obesity with serious comorbidity and body mass index (BMI) of 31.0 to 31.9 in adult 10/18/2012   ESOPHAGEAL STRICTURE 11/09/2009   SKIN TAG 10/05/2009   GERD 09/14/2009   POSTMENOPAUSAL STATUS 09/14/2009   BREAST IMPLANTS, BILATERAL, HX OF 07/28/2007   Essential hypertension 05/18/2006   CYSTOCELE WITH INCOMPLETE UTERINE PROLAPSE 05/18/2006   HIRSUTISM 05/18/2006   HEADACHE 05/18/2006   LAPAROSCOPY, HX OF 05/18/2006    Current Outpatient Medications  Medication Sig Dispense Refill   aspirin 81 MG chewable tablet Chew 81 mg by mouth at bedtime.     atenolol (TENORMIN) 25 MG tablet Take 25 mg by mouth daily.     atorvastatin (LIPITOR) 40 MG tablet TAKE 1 TABLET BY MOUTH EVERYDAY AT BEDTIME 90 tablet 1   benzonatate (TESSALON) 100 MG capsule Take  1 capsule (100 mg total) by mouth 3 (three) times daily as needed. 20 capsule 0   bromocriptine (PARLODEL) 2.5 MG tablet Take by mouth daily. 1/0 tab po qd     Calcium Carbonate-Vitamin D (OSCAL 500/200 D-3 PO) Take 1 tablet by mouth at bedtime.     Continuous Blood Gluc Sensor (FREESTYLE LIBRE 14 DAY SENSOR) MISC 1 each by Does not apply route every 14 (fourteen) days. 6 each 3   CRANBERRY-VITAMIN C PO Take 2 capsules by mouth at bedtime.     fenofibrate 160 MG tablet Take 1 tablet (160 mg total) by mouth daily. 90 tablet 1   Flaxseed, Linseed, (FLAXSEED OIL PO) Take 1,400 mg by mouth at bedtime.     Ipratropium-Albuterol (COMBIVENT RESPIMAT) 20-100 MCG/ACT AERS respimat Inhale 1 puff into the lungs 4 (four) times daily as needed for wheezing or shortness of breath. 4 g 5   molnupiravir EUA (LAGEVRIO) 200 mg CAPS capsule Take 4 capsules (800 mg total) by mouth 2 (two) times daily for 5 days. 40 capsule 0   mometasone (NASONEX) 50 MCG/ACT nasal spray Place 2 sprays into the nose daily.     montelukast (SINGULAIR) 10 MG tablet Take 1 tablet (10 mg total) by mouth daily. 90 tablet 3   nitroGLYCERIN (NITROSTAT) 0.4 MG SL tablet Place 1 tablet (0.4 mg total) under the tongue every 5 (five) minutes as needed for chest pain. 25 tablet 3   ondansetron (ZOFRAN) 8 MG  tablet Take 1 tablet (8 mg total) by mouth every 8 (eight) hours as needed for nausea or vomiting. 20 tablet 0   pantoprazole (PROTONIX) 40 MG tablet Take 1 tablet (40 mg total) by mouth 2 (two) times daily. 180 tablet 3   Propylene Glycol 0.6 % SOLN Apply 1 drop to eye 4 (four) times daily as needed for dry eyes (dry/irritated eyes).     tirzepatide (MOUNJARO) 10 MG/0.5ML Pen Inject 10 mg into the skin once a week. (Patient taking differently: Inject 10 mg into the skin once a week. Inject 7.5 mg into skin once weekly) 2 mL 0   Vitamin D, Ergocalciferol, (DRISDOL) 1.25 MG (50000 UNIT) CAPS capsule Take 1 capsule (50,000 Units total) by mouth  every 7 (seven) days. 12 capsule 1   No current facility-administered medications for this visit.    Allergies: Contrast media [iodinated contrast media], Metamucil [psyllium], and Mustard seed  Past Medical History:  Diagnosis Date   Abdominal pain, left upper quadrant 03/01/2013   Abnormal liver function tests 03/01/2013   Allergy    seasonal   Angina at rest [facility]) 03/21/2020   Angina pectoris ([place]) 03/21/2020   Asthma    Back pain    BREAST IMPLANTS, BILATERAL, HX OF 07/28/2007   Qualifier: Diagnosis of  By: [name]     CYSTOCELE WITH INCOMPLETE UTERINE PROLAPSE 05/18/2006   Qualifier: Diagnosis of  By: [name]     Diabetes ([place]) 01/29/2014   Diabetes mellitus without complication ([place])    Edema of both lower extremities    ESOPHAGEAL STRICTURE 11/09/2009   Qualifier: Diagnosis of  By: [name] ([place]), [name]     Essential hypertension 05/18/2006   Qualifier: Diagnosis of  By: [name]     Extrinsic asthma with exacerbation, unspecified asthma severity 10/26/2020   GERD 09/14/2009   Qualifier: Diagnosis of  By: [name]     GERD (gastroesophageal reflux disease)    HEADACHE 05/18/2006   Qualifier: Diagnosis of  By: [name]     Hirsutism 05/18/2006   Qualifier: Diagnosis of  By: [name]     Hyperlipidemia    Hyperlipidemia associated with type 2 diabetes mellitus ([place]) 04/15/2019   Hypertension    Kidney stones    LAPAROSCOPY, HX OF 05/18/2006   Qualifier: Diagnosis of  By: [name]     Mixed dyslipidemia 06/22/2020   Nonspecific (abnormal) findings on radiological and other examination of biliary tract 03/01/2013   Obesity (BMI 30-39.9) 10/18/2012   Other chest pain 03/21/2020   Palpitation 03/21/2020   Palpitations    POSTMENOPAUSAL STATUS 09/14/2009   Qualifier: Diagnosis of  By: [name]     Preventative health care 10/26/2020   Seasonal allergic rhinitis 10/26/2020   SKIN TAG 10/05/2009   Qualifier: Diagnosis of  By:  [name]     SOB (shortness of breath)    Uncontrolled type 2 diabetes mellitus with hyperglycemia ([place]) 04/15/2019   Vitamin D deficiency    Vitamin D insufficiency 05/21/2020    Past Surgical History:  Procedure Laterality Date   ABDOMINAL HYSTERECTOMY  08/2004   APPENDECTOMY     AUGMENTATION MAMMAPLASTY Bilateral 2000   BREAST ENHANCEMENT SURGERY  2001   COLONOSCOPY     LEFT HEART CATH AND CORONARY ANGIOGRAPHY N/A 04/09/2020   Procedure: LEFT HEART CATH AND CORONARY ANGIOGRAPHY;  Surgeon: Leonie Man, MD;  Location: [facility];  Service:  Cardiovascular;  Laterality: N/A;   POLYPECTOMY  2009   sigmoid polyps   SHOULDER ARTHROSCOPY W/ ROTATOR CUFF REPAIR Right 06/06/2015   [facility]   TONSILLECTOMY     TUBAL LIGATION      Family History  Problem Relation Age of Onset   Rheumatic fever Mother    Irritable bowel syndrome Mother    Breast cancer Mother    Diabetes Mother    High blood pressure Mother    High Cholesterol Mother    Heart disease Mother    Stroke Mother    Sudden death Mother    Obesity Mother    Cancer Father    Diabetes Father    Colon polyps Father    Kidney disease Father    High blood pressure Father    High Cholesterol Father    Heart disease Father    Obesity Father    Cervical cancer Maternal Grandmother    Breast cancer Maternal Grandmother    Colon cancer Maternal Grandmother    Uterine cancer Maternal Grandmother    Coronary artery disease Other    Hyperlipidemia Other    Hypertension Other    Asthma Other    Esophageal cancer Neg Hx    Rectal cancer Neg Hx    Stomach cancer Neg Hx     Social History   Tobacco Use   Smoking status: Never   Smokeless tobacco: Never  Substance Use Topics   Alcohol use: Yes    Comment: rare    Subjective:    I connected with Nicole Richard on 02/12/21 at  2:00 PM EST by a video enabled telemedicine application and verified that I am speaking with the correct person using two  identifiers.   I discussed the limitations of evaluation and management by telemedicine and the availability of in person appointments. The patient expressed understanding and agreed to proceed. Provider in office/ patient is at home; provider and patient are only 2 people on video call.   Started yesterday with sore throat/ head congestion/ fatigue; took home COVID test which was positive; notes that by this am, she felt like she "had been hit by a truck." Underlying asthma/ diabetes- wanted to be sure to get ahead of infection.     Objective:  Vitals:   02/12/21 1348  Temp: (!) 97.1 F (36.2 C)  TempSrc: Temporal  Weight: 155 lb (70.3 kg)  Height: 5\' 1"  (1.549 m)    General: Well developed, well nourished, in no acute distress  Skin : Warm and dry.  Head: Normocephalic and atraumatic  Lungs: Respirations unlabored;  Neurologic: Alert and oriented; speech intact; face symmetrical;   Assessment:  1. COVID-19     Plan:  Rx for Molnupiravir- take as directed; Rx for Tessalon Perles 100 mg tid prn; increase fluids, rest; okay to take Tylenol or Advil as needed; patient will follow up if symptoms worse, no better or she does not feel ready to return to work on Monday;   No follow-ups on file.  No orders of the defined types were placed in this encounter.   Requested Prescriptions   Signed Prescriptions Disp Refills   molnupiravir EUA (LAGEVRIO) 200 mg CAPS capsule 40 capsule 0    Sig: Take 4 capsules (800 mg total) by mouth 2 (two) times daily for 5 days.   benzonatate (TESSALON) 100 MG capsule 20 capsule 0    Sig: Take 1 capsule (100 mg total) by mouth 3 (three) times daily as needed.

## 2021-02-18 ENCOUNTER — Encounter: Payer: Self-pay | Admitting: Family

## 2021-02-19 ENCOUNTER — Ambulatory Visit: Payer: BC Managed Care – PPO | Admitting: Family Medicine

## 2021-02-19 ENCOUNTER — Other Ambulatory Visit: Payer: Self-pay

## 2021-02-19 ENCOUNTER — Ambulatory Visit (HOSPITAL_BASED_OUTPATIENT_CLINIC_OR_DEPARTMENT_OTHER)
Admission: RE | Admit: 2021-02-19 | Discharge: 2021-02-19 | Disposition: A | Discharge status: Home / Self Care | Payer: BC Managed Care – PPO | Source: Ambulatory Visit | Attending: Family Medicine | Admitting: Family Medicine

## 2021-02-19 ENCOUNTER — Encounter: Payer: Self-pay | Admitting: Family Medicine

## 2021-02-19 VITALS — BP 132/80 | HR 80 | Temp 99.0°F | Resp 20 | Ht 61.0 in | Wt 160.8 lb

## 2021-02-19 DIAGNOSIS — J40 Bronchitis, not specified as acute or chronic: Secondary | ICD-10-CM | POA: Diagnosis not present

## 2021-02-19 DIAGNOSIS — R051 Acute cough: Secondary | ICD-10-CM | POA: Insufficient documentation

## 2021-02-19 DIAGNOSIS — R059 Cough, unspecified: Secondary | ICD-10-CM | POA: Diagnosis not present

## 2021-02-19 MED ORDER — AZITHROMYCIN 250 MG PO TABS: ORAL_TABLET | ORAL | 0 refills | Status: DC

## 2021-02-19 MED ORDER — PROMETHAZINE-DM 6.25-15 MG/5ML PO SYRP: 5.0000 mL | ORAL_SOLUTION | Freq: Four times a day (QID) | ORAL | 0 refills | Status: DC | PRN

## 2021-02-19 NOTE — Patient Instructions (Signed)
Acute Bronchitis, Adult °Acute bronchitis is sudden inflammation of the main airways (bronchi) that come off the windpipe (trachea) in the lungs. The swelling causes the airways to get smaller and make more mucus than normal. This can make it hard to breathe and can cause coughing or noisy breathing (wheezing). °Acute bronchitis may last several weeks. The cough may last longer. Allergies, asthma, and exposure to smoke may make the condition worse. °What are the causes? °This condition can be caused by germs and by substances that irritate the lungs, including: °Cold and flu viruses. The most common cause of this condition is the virus that causes the common cold. °Bacteria. This is less common. °Breathing in substances that irritate the lungs, including: °Smoke from cigarettes and other forms of tobacco. °Dust and pollen. °Fumes from household cleaning products, gases, or burned fuel. °Indoor or outdoor air pollution. °What increases the risk? °The following factors may make you more likely to develop this condition: °A weak body's defense system, also called the immune system. °A condition that affects your lungs and breathing, such as asthma. °What are the signs or symptoms? °Common symptoms of this condition include: °Coughing. This may bring up clear, yellow, or green mucus from your lungs (sputum). °Wheezing. °Runny or stuffy nose. °Having too much mucus in your lungs (chest congestion). °Shortness of breath. °Aches and pains, including sore throat or chest. °How is this diagnosed? °This condition is usually diagnosed based on: °Your symptoms and medical history. °A physical exam. °You may also have other tests, including tests to rule out other conditions, such as pneumonia. These tests include: °A test of lung function. °Test of a mucus sample to look for the presence of bacteria. °Tests to check the oxygen level in your blood. °Blood tests. °Chest X-ray. °How is this treated? °Most cases of acute bronchitis  clear up over time without treatment. Your health care provider may recommend: °Drinking more fluids to help thin your mucus so it is easier to cough up. °Taking inhaled medicine (inhaler) to improve air flow in and out of your lungs. °Using a vaporizer or a humidifier. These are machines that add water to the air to help you breathe better. °Taking a medicine that thins mucus and clears congestion (expectorant). °Taking a medicine that prevents or stops coughing (cough suppressant). °It is notcommon to take an antibiotic medicine for this condition. °Follow these instructions at home: ° °Take over-the-counter and prescription medicines only as told by your health care provider. °Use an inhaler, vaporizer, or humidifier as told by your health care provider. °Take two teaspoons (10 mL) of honey at bedtime to lessen coughing at night. °Drink enough fluid to keep your urine pale yellow. °Do not use any products that contain nicotine or tobacco. These products include cigarettes, chewing tobacco, and vaping devices, such as e-cigarettes. If you need help quitting, ask your health care provider. °Get plenty of rest. °Return to your normal activities as told by your health care provider. Ask your health care provider what activities are safe for you. °Keep all follow-up visits. This is important. °How is this prevented? °To lower your risk of getting this condition again: °Wash your hands often with soap and water for at least 20 seconds. If soap and water are not available, use hand sanitizer. °Avoid contact with people who have cold symptoms. °Try not to touch your mouth, nose, or eyes with your hands. °Avoid breathing in smoke or chemical fumes. Breathing smoke or chemical fumes will make your condition   worse. °Get the flu shot every year. °Contact a health care provider if: °Your symptoms do not improve after 2 weeks. °You have trouble coughing up the mucus. °Your cough keeps you awake at night. °You have a  fever. °Get help right away if you: °Cough up blood. °Feel pain in your chest. °Have severe shortness of breath. °Faint or keep feeling like you are going to faint. °Have a severe headache. °Have a fever or chills that get worse. °These symptoms may represent a serious problem that is an emergency. Do not wait to see if the symptoms will go away. Get medical help right away. Call your local emergency services (911 in the [place] Do not drive yourself to the hospital. °Summary °Acute bronchitis is inflammation of the main airways (bronchi) that come off the windpipe (trachea) in the lungs. The swelling causes the airways to get smaller and make more mucus than normal. °Drinking more fluids can help thin your mucus so it is easier to cough up. °Take over-the-counter and prescription medicines only as told by your health care provider. °Do not use any products that contain nicotine or tobacco. These products include cigarettes, chewing tobacco, and vaping devices, such as e-cigarettes. If you need help quitting, ask your health care provider. °Contact a health care provider if your symptoms do not improve after 2 weeks. °This information is not intended to replace advice given to you by your health care provider. Make sure you discuss any questions you have with your health care provider. °Document Revised: 05/09/2020 Document Reviewed: 05/09/2020 °Elsevier Patient Education © 2022 [employer] ° °

## 2021-02-19 NOTE — Progress Notes (Signed)
Established Patient Office Visit  Subjective:  Patient ID: Nicole Richard, female    DOB: 01/14/1956  Age: 66 y.o. MRN: [id number]  CC:  Chief Complaint  Patient presents with   Post COVID Sxs    Pt states testing positive for COVID last week and treated will antiviral. Pt states having cough, sore throat, and headache.     HPI Nicole Richard presents for f/u + covid last week.  She took antiviral.  She still has sinus pressure/ pain, sore throat and headache.   +cough , productive  + low grade fevers   Past Medical History:  Diagnosis Date   Abdominal pain, left upper quadrant 03/01/2013   Abnormal liver function tests 03/01/2013   Allergy    seasonal   Angina at rest [facility]) 03/21/2020   Angina pectoris ([place]) 03/21/2020   Asthma    Back pain    BREAST IMPLANTS, BILATERAL, HX OF 07/28/2007   Qualifier: Diagnosis of  By: [name]     CYSTOCELE WITH INCOMPLETE UTERINE PROLAPSE 05/18/2006   Qualifier: Diagnosis of  By: [name]     Diabetes ([place]) 01/29/2014   Diabetes mellitus without complication ([place])    Edema of both lower extremities    ESOPHAGEAL STRICTURE 11/09/2009   Qualifier: Diagnosis of  By: [name] ([place]), [name]     Essential hypertension 05/18/2006   Qualifier: Diagnosis of  By: [name]     Extrinsic asthma with exacerbation, unspecified asthma severity 10/26/2020   GERD 09/14/2009   Qualifier: Diagnosis of  By: [name]     GERD (gastroesophageal reflux disease)    HEADACHE 05/18/2006   Qualifier: Diagnosis of  By: [name]     Hirsutism 05/18/2006   Qualifier: Diagnosis of  By: [name]     Hyperlipidemia    Hyperlipidemia associated with type 2 diabetes mellitus ([place]) 04/15/2019   Hypertension    Kidney stones    LAPAROSCOPY, HX OF 05/18/2006   Qualifier: Diagnosis of  By: [name]     Mixed dyslipidemia 06/22/2020   Nonspecific (abnormal) findings on radiological and other examination of biliary tract  03/01/2013   Obesity (BMI 30-39.9) 10/18/2012   Other chest pain 03/21/2020   Palpitation 03/21/2020   Palpitations    POSTMENOPAUSAL STATUS 09/14/2009   Qualifier: Diagnosis of  By: [name]     Preventative health care 10/26/2020   Seasonal allergic rhinitis 10/26/2020   SKIN TAG 10/05/2009   Qualifier: Diagnosis of  By: [name]     SOB (shortness of breath)    Uncontrolled type 2 diabetes mellitus with hyperglycemia ([place]) 04/15/2019   Vitamin D deficiency    Vitamin D insufficiency 05/21/2020    Past Surgical History:  Procedure Laterality Date   ABDOMINAL HYSTERECTOMY  08/2004   APPENDECTOMY     AUGMENTATION MAMMAPLASTY Bilateral 2000   BREAST ENHANCEMENT SURGERY  2001   COLONOSCOPY     LEFT HEART CATH AND CORONARY ANGIOGRAPHY N/A 04/09/2020   Procedure: LEFT HEART CATH AND CORONARY ANGIOGRAPHY;  Surgeon: Leonie Man, MD;  Location: [facility];  Service: Cardiovascular;  Laterality: N/A;   POLYPECTOMY  2009   sigmoid polyps   SHOULDER ARTHROSCOPY W/ ROTATOR CUFF REPAIR Right 06/06/2015   [facility]   TONSILLECTOMY     TUBAL LIGATION      Family History  Problem Relation Age of Onset   Rheumatic fever Mother  Irritable bowel syndrome Mother    Breast cancer Mother    Diabetes Mother    High blood pressure Mother    High Cholesterol Mother    Heart disease Mother    Stroke Mother    Sudden death Mother    Obesity Mother    Cancer Father    Diabetes Father    Colon polyps Father    Kidney disease Father    High blood pressure Father    High Cholesterol Father    Heart disease Father    Obesity Father    Cervical cancer Maternal Grandmother    Breast cancer Maternal Grandmother    Colon cancer Maternal Grandmother    Uterine cancer Maternal Grandmother    Coronary artery disease Other    Hyperlipidemia Other    Hypertension Other    Asthma Other    Esophageal cancer Neg Hx    Rectal cancer Neg Hx    Stomach cancer Neg Hx     Social  History   Socioeconomic History   Marital status: Divorced    Spouse name: Not on file   Number of children: 2   Years of education: Not on file   Highest education level: Not on file  Occupational History   Occupation: Nurse  Tobacco Use   Smoking status: Never   Smokeless tobacco: Never  Vaping Use   Vaping Use: Never used  Substance and Sexual Activity   Alcohol use: Yes    Comment: rare   Drug use: No   Sexual activity: Not Currently    Partners: Male  Other Topics Concern   Not on file  Social History Narrative   Buyer, retail , and pt daily   Social Determinants of Health   Financial Resource Strain: Not on file  Food Insecurity: Not on file  Transportation Needs: Not on file  Physical Activity: Not on file  Stress: Not on file  Social Connections: Not on file  Intimate Partner Violence: Not on file    Outpatient Medications Prior to Visit  Medication Sig Dispense Refill   aspirin 81 MG chewable tablet Chew 81 mg by mouth at bedtime.     atenolol (TENORMIN) 25 MG tablet Take 25 mg by mouth daily.     atorvastatin (LIPITOR) 40 MG tablet TAKE 1 TABLET BY MOUTH EVERYDAY AT BEDTIME 90 tablet 1   benzonatate (TESSALON) 100 MG capsule Take 1 capsule (100 mg total) by mouth 3 (three) times daily as needed. 20 capsule 0   bromocriptine (PARLODEL) 2.5 MG tablet Take by mouth daily. 1/0 tab po qd     Calcium Carbonate-Vitamin D (OSCAL 500/200 D-3 PO) Take 1 tablet by mouth at bedtime.     Continuous Blood Gluc Sensor (FREESTYLE LIBRE 14 DAY SENSOR) MISC 1 each by Does not apply route every 14 (fourteen) days. 6 each 3   CRANBERRY-VITAMIN C PO Take 2 capsules by mouth at bedtime.     fenofibrate 160 MG tablet Take 1 tablet (160 mg total) by mouth daily. 90 tablet 1   Flaxseed, Linseed, (FLAXSEED OIL PO) Take 1,400 mg by mouth at bedtime.     Ipratropium-Albuterol (COMBIVENT RESPIMAT) 20-100 MCG/ACT AERS respimat Inhale 1 puff into the lungs 4 (four) times daily as  needed for wheezing or shortness of breath. 4 g 5   mometasone (NASONEX) 50 MCG/ACT nasal spray Place 2 sprays into the nose daily.     montelukast (SINGULAIR) 10 MG tablet Take 1 tablet (10 mg total) by  mouth daily. 90 tablet 3   ondansetron (ZOFRAN) 8 MG tablet Take 1 tablet (8 mg total) by mouth every 8 (eight) hours as needed for nausea or vomiting. 20 tablet 0   pantoprazole (PROTONIX) 40 MG tablet Take 1 tablet (40 mg total) by mouth 2 (two) times daily. 180 tablet 3   Propylene Glycol 0.6 % SOLN Apply 1 drop to eye 4 (four) times daily as needed for dry eyes (dry/irritated eyes).     tirzepatide (MOUNJARO) 10 MG/0.5ML Pen Inject 10 mg into the skin once a week. (Patient taking differently: Inject 10 mg into the skin once a week. Inject 7.5 mg into skin once weekly) 2 mL 0   Vitamin D, Ergocalciferol, (DRISDOL) 1.25 MG (50000 UNIT) CAPS capsule Take 1 capsule (50,000 Units total) by mouth every 7 (seven) days. 12 capsule 1   nitroGLYCERIN (NITROSTAT) 0.4 MG SL tablet Place 1 tablet (0.4 mg total) under the tongue every 5 (five) minutes as needed for chest pain. 25 tablet 3   No facility-administered medications prior to visit.    Allergies  Allergen Reactions   Contrast Media [Iodinated Contrast Media] Anaphylaxis   Metamucil [Psyllium] Anaphylaxis   Mustard Seed Anaphylaxis    ROS Review of Systems  Constitutional:  Negative for fever.  HENT:  Negative for congestion.   Respiratory:  Negative for cough and shortness of breath.   Cardiovascular:  Negative for chest pain, palpitations and leg swelling.  Gastrointestinal:  Negative for vomiting.  Musculoskeletal:  Negative for back pain.  Skin:  Negative for rash.  Neurological:  Negative for headaches.     Objective:    Physical Exam Vitals and nursing note reviewed.  Constitutional:      Appearance: She is well-developed.  HENT:     Head: Normocephalic and atraumatic.  Eyes:     Conjunctiva/sclera: Conjunctivae normal.   Neck:     Thyroid: No thyromegaly.     Vascular: No carotid bruit or JVD.  Cardiovascular:     Rate and Rhythm: Normal rate and regular rhythm.     Heart sounds: Normal heart sounds. No murmur heard. Pulmonary:     Effort: Pulmonary effort is normal. No respiratory distress.     Breath sounds: Normal breath sounds. No wheezing or rales.  Chest:     Chest wall: No tenderness.  Musculoskeletal:     Cervical back: Normal range of motion and neck supple.  Neurological:     Mental Status: She is alert and oriented to person, place, and time.   BP 132/80 (BP Location: Left Arm, Patient Position: Sitting, Cuff Size: Normal)    Pulse 80    Temp 99 F (37.2 C) (Oral)    Resp 20    Ht 5' 1" (1.549 m)    Wt 160 lb 12.8 oz (72.9 kg)    SpO2 96%    BMI 30.38 kg/m  Wt Readings from Last 3 Encounters:  02/19/21 160 lb 12.8 oz (72.9 kg)  02/12/21 155 lb (70.3 kg)  02/11/21 155 lb (70.3 kg)     There are no preventive care reminders to display for this patient.  There are no preventive care reminders to display for this patient.  Lab Results  Component Value Date   TSH 1.05 10/26/2020   Lab Results  Component Value Date   WBC 6.6 10/26/2020   HGB 14.5 10/26/2020   HCT 43.7 10/26/2020   MCV 88.6 10/26/2020   PLT 337.0 10/26/2020   Lab Results  Component Value Date   NA 141 10/26/2020   K 4.8 10/26/2020   CO2 31 10/26/2020   GLUCOSE 127 (H) 10/26/2020   BUN 28 (H) 10/26/2020   CREATININE 0.97 10/26/2020   BILITOT 0.7 10/26/2020   ALKPHOS 105 10/26/2020   AST 20 10/26/2020   ALT 31 10/26/2020   PROT 7.4 10/26/2020   ALBUMIN 4.6 10/26/2020   CALCIUM 10.3 10/26/2020   EGFR 69 05/16/2020   GFR 62.29 10/26/2020   Lab Results  Component Value Date   CHOL 149 10/26/2020   Lab Results  Component Value Date   HDL 49.80 10/26/2020   Lab Results  Component Value Date   LDLCALC 81 10/26/2020   Lab Results  Component Value Date   TRIG 92.0 10/26/2020   Lab Results   Component Value Date   CHOLHDL 3 10/26/2020   Lab Results  Component Value Date   HGBA1C 6.5 (A) 12/18/2020      Assessment & Plan:   Problem List Items Addressed This Visit   None Visit Diagnoses     Acute cough    -  Primary   Relevant Medications   promethazine-dextromethorphan (PROMETHAZINE-DM) 6.25-15 MG/5ML syrup   Other Relevant Orders   DG Chest 2 View   Bronchitis       Relevant Medications   azithromycin (ZITHROMAX Z-PAK) 250 MG tablet       Meds ordered this encounter  Medications   azithromycin (ZITHROMAX Z-PAK) 250 MG tablet    Sig: As directed    Dispense:  6 each    Refill:  0   promethazine-dextromethorphan (PROMETHAZINE-DM) 6.25-15 MG/5ML syrup    Sig: Take 5 mLs by mouth 4 (four) times daily as needed.    Dispense:  118 mL    Refill:  0    Follow-up: Return if symptoms worsen or fail to improve.    [name], DO

## 2021-03-04 ENCOUNTER — Ambulatory Visit (INDEPENDENT_AMBULATORY_CARE_PROVIDER_SITE_OTHER): Payer: BC Managed Care – PPO | Admitting: Family Medicine

## 2021-03-09 ENCOUNTER — Other Ambulatory Visit: Payer: Self-pay | Admitting: Family Medicine

## 2021-03-09 DIAGNOSIS — I10 Essential (primary) hypertension: Secondary | ICD-10-CM

## 2021-03-12 ENCOUNTER — Encounter (INDEPENDENT_AMBULATORY_CARE_PROVIDER_SITE_OTHER): Payer: Self-pay | Admitting: Physician Assistant

## 2021-03-12 ENCOUNTER — Encounter (INDEPENDENT_AMBULATORY_CARE_PROVIDER_SITE_OTHER): Payer: Self-pay

## 2021-03-12 ENCOUNTER — Other Ambulatory Visit: Payer: Self-pay

## 2021-03-12 ENCOUNTER — Ambulatory Visit (INDEPENDENT_AMBULATORY_CARE_PROVIDER_SITE_OTHER): Payer: BC Managed Care – PPO | Admitting: Physician Assistant

## 2021-03-12 VITALS — BP 137/77 | HR 85 | Temp 97.9°F | Ht 61.0 in | Wt 155.0 lb

## 2021-03-12 DIAGNOSIS — E669 Obesity, unspecified: Secondary | ICD-10-CM

## 2021-03-12 DIAGNOSIS — Z6829 Body mass index (BMI) 29.0-29.9, adult: Secondary | ICD-10-CM | POA: Diagnosis not present

## 2021-03-12 DIAGNOSIS — Z9189 Other specified personal risk factors, not elsewhere classified: Secondary | ICD-10-CM

## 2021-03-12 DIAGNOSIS — Z7985 Long-term (current) use of injectable non-insulin antidiabetic drugs: Secondary | ICD-10-CM | POA: Diagnosis not present

## 2021-03-12 DIAGNOSIS — E1165 Type 2 diabetes mellitus with hyperglycemia: Secondary | ICD-10-CM

## 2021-03-12 DIAGNOSIS — Z6831 Body mass index (BMI) 31.0-31.9, adult: Secondary | ICD-10-CM

## 2021-03-12 MED ORDER — TIRZEPATIDE 10 MG/0.5ML ~~LOC~~ SOAJ: 10.0000 mg | SUBCUTANEOUS | 0 refills | Status: DC

## 2021-03-12 NOTE — Progress Notes (Signed)
Chief Complaint:   OBESITY Nicole Richard is here to discuss her progress with her obesity treatment plan along with follow-up of her obesity related diagnoses. Nicole Richard is on the Category 2 Plan and states she is following her eating plan approximately 75% of the time. Nicole Richard states she is doing 0 minutes 0 times per week.  Today's visit was #: 91 Starting weight: 163 lbs Starting date: 05/13/2020 Today's weight: 155 lbs Today's date: 03/12/2021 Total lbs lost to date: 8 Total lbs lost since last in-office visit: 0  Interim History: For breakfast Nicole Richard is eating  scrambled eggs or oatmeal, with eggs or [place] bacon or sausage. She is getting 4-6 oz of protein for lunch. She recently had COVID and didn't have an appetite for about a week. She is trying to stay between 1100-1200 calories and she is getting 60-80 grams of protein daily.  Subjective:   1. Type 2 diabetes mellitus with hyperglycemia, without long-term current use of insulin [facility]) Lesly sees Dr. Loanne Drilling, Endocrinology. Last A1c much improved at 6.5. She is on Mounjaro 7.5 mg. Her blood sugars range between 130-190, higher when she had COVID. No hypoglycemia. Her appetite is not fully controlled.   2. At risk for heart disease Braylynn is at higher than average risk for cardiovascular disease due to obesity.  Assessment/Plan:   1. Type 2 diabetes mellitus with hyperglycemia, without long-term current use of insulin (HCC) [place] will continue to follow up with Dr. Loanne Drilling. We will refill Mounjaro to 10 mg for 1 month. Good blood sugar control is important to decrease the likelihood of diabetic complications such as nephropathy, neuropathy, limb loss, blindness, coronary artery disease, and death. Intensive lifestyle modification including diet, exercise and weight loss are the first line of treatment for diabetes.   2. At risk for heart disease Nicole Richard was given approximately 15 minutes of coronary artery disease prevention  counseling today. She is 66 y.o. female and has risk factors for heart disease including obesity. We discussed intensive lifestyle modifications today with an emphasis on specific weight loss instructions and strategies.  Repetitive spaced learning was employed today to elicit superior memory formation and behavioral change.   3. Obesity BMI today is 29.3 Nicole Richard is currently in the action stage of change. As such, her goal is to continue with weight loss efforts. She has agreed to the Category 2 Plan.   We will recheck fasting labs at her next visit.  Exercise goals: No exercise has been prescribed at this time.  Behavioral modification strategies: increasing lean protein intake and meal planning and cooking strategies.  [name] has agreed to follow-up with our clinic in 3 weeks. She was informed of the importance of frequent follow-up visits to maximize her success with intensive lifestyle modifications for her multiple health conditions.   Objective:   Blood pressure 137/77, pulse 85, temperature 97.9 F (36.6 C), height 5\' 1"  (1.549 m), weight 155 lb (70.3 kg), SpO2 100 %. Body mass index is 29.29 kg/m.  General: Cooperative, alert, well developed, in no acute distress. HEENT: Conjunctivae and lids unremarkable. Cardiovascular: Regular rhythm.  Lungs: Normal work of breathing. Neurologic: No focal deficits.   Lab Results  Component Value Date   CREATININE 0.97 10/26/2020   BUN 28 (H) 10/26/2020   NA 141 10/26/2020   K 4.8 10/26/2020   CL 104 10/26/2020   CO2 31 10/26/2020   Lab Results  Component Value Date   ALT 31 10/26/2020   AST 20 10/26/2020  ALKPHOS 105 10/26/2020   BILITOT 0.7 10/26/2020   Lab Results  Component Value Date   HGBA1C 6.5 (A) 12/18/2020   HGBA1C 7.0 (H) 10/26/2020   HGBA1C 6.3 (A) 09/13/2020   HGBA1C 6.8 (A) 06/08/2020   HGBA1C 7.9 (H) 05/16/2020   Lab Results  Component Value Date   INSULIN 46.9 (H) 05/16/2020   Lab Results  Component  Value Date   TSH 1.05 10/26/2020   Lab Results  Component Value Date   CHOL 149 10/26/2020   HDL 49.80 10/26/2020   LDLCALC 81 10/26/2020   LDLDIRECT 128.1 10/05/2013   TRIG 92.0 10/26/2020   CHOLHDL 3 10/26/2020   Lab Results  Component Value Date   VD25OH 63.69 10/26/2020   VD25OH 26.5 (L) 05/16/2020   VD25OH 41 09/14/2009   Lab Results  Component Value Date   WBC 6.6 10/26/2020   HGB 14.5 10/26/2020   HCT 43.7 10/26/2020   MCV 88.6 10/26/2020   PLT 337.0 10/26/2020   No results found for: IRON, TIBC, FERRITIN  Attestation Statements:   Reviewed by clinician on day of visit: allergies, medications, problem list, medical history, surgical history, family history, social history, and previous encounter notes.   Wilhemena Durie, am acting as transcriptionist for [employer], PA-C.  I have reviewed the above documentation for accuracy and completeness, and I agree with the above. Abby Potash, PA-C

## 2021-03-18 ENCOUNTER — Other Ambulatory Visit: Payer: Self-pay

## 2021-03-18 ENCOUNTER — Encounter: Payer: Self-pay | Admitting: Endocrinology

## 2021-03-18 ENCOUNTER — Ambulatory Visit: Payer: BC Managed Care – PPO | Admitting: Endocrinology

## 2021-03-18 VITALS — BP 118/80 | HR 78 | Ht 61.0 in | Wt 158.4 lb

## 2021-03-18 DIAGNOSIS — E119 Type 2 diabetes mellitus without complications: Secondary | ICD-10-CM

## 2021-03-18 LAB — POCT GLYCOSYLATED HEMOGLOBIN (HGB A1C): Hemoglobin A1C: 6.6 % — AB (ref 4.0–5.6)

## 2021-03-18 NOTE — Progress Notes (Signed)
Subjective:    Patient ID: Nicole Richard, female    DOB: 10/13/1955, 66 y.o.   MRN: [id number]  HPI Pt returns for f/u of diabetes mellitus:  DM type: 2 Dx'ed: 6734 Complications: CAD and PN Therapy: bromocriptine and Mounjaro.   GDM: never DKA: never Severe hypoglycemia: never.  Pancreatitis: never.  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   Interval history: She takes meds as rx'ed.  pt states she feels well in general.  I reviewed continuous glucose monitor data.  Glucose varies from 75-260, but most are in the 100's.  It is in general highest at 10PM, and lowest at [place].  However, There is little trend throughout the day.  HWW plans to further increase Mounjaro.   Past Medical History:  Diagnosis Date   Abdominal pain, left upper quadrant 03/01/2013   Abnormal liver function tests 03/01/2013   Allergy    seasonal   Angina at rest [facility]) 03/21/2020   Angina pectoris ([place]) 03/21/2020   Asthma    Back pain    BREAST IMPLANTS, BILATERAL, HX OF 07/28/2007   Qualifier: Diagnosis of  By: [name]     CYSTOCELE WITH INCOMPLETE UTERINE PROLAPSE 05/18/2006   Qualifier: Diagnosis of  By: [name]     Diabetes ([place]) 01/29/2014   Diabetes mellitus without complication ([place])    Edema of both lower extremities    ESOPHAGEAL STRICTURE 11/09/2009   Qualifier: Diagnosis of  By: [name] ([place]), [name]     Essential hypertension 05/18/2006   Qualifier: Diagnosis of  By: [name]     Extrinsic asthma with exacerbation, unspecified asthma severity 10/26/2020   GERD 09/14/2009   Qualifier: Diagnosis of  By: [name]     GERD (gastroesophageal reflux disease)    HEADACHE 05/18/2006   Qualifier: Diagnosis of  By: [name]     Hirsutism 05/18/2006   Qualifier: Diagnosis of  By: [name]     Hyperlipidemia    Hyperlipidemia associated with type 2  diabetes mellitus ([place]) 04/15/2019   Hypertension    Kidney stones    LAPAROSCOPY, HX OF 05/18/2006   Qualifier: Diagnosis of  By: [name]     Mixed dyslipidemia 06/22/2020   Nonspecific (abnormal) findings on radiological and other examination of biliary tract 03/01/2013   Obesity (BMI 30-39.9) 10/18/2012   Other chest pain 03/21/2020   Palpitation 03/21/2020   Palpitations    POSTMENOPAUSAL STATUS 09/14/2009   Qualifier: Diagnosis of  By: [name]     Preventative health care 10/26/2020   Seasonal allergic rhinitis 10/26/2020   SKIN TAG 10/05/2009   Qualifier: Diagnosis of  By: [name]     SOB (shortness of breath)    Uncontrolled type 2 diabetes mellitus with hyperglycemia ([place]) 04/15/2019   Vitamin D deficiency    Vitamin D insufficiency 05/21/2020    Past Surgical History:  Procedure Laterality Date   ABDOMINAL HYSTERECTOMY  08/2004   APPENDECTOMY     AUGMENTATION MAMMAPLASTY Bilateral 2000   BREAST ENHANCEMENT SURGERY  2001   COLONOSCOPY     LEFT HEART CATH AND CORONARY ANGIOGRAPHY N/A 04/09/2020   Procedure: LEFT HEART CATH AND CORONARY ANGIOGRAPHY;  Surgeon: Leonie Man, MD;  Location: [facility];  Service: Cardiovascular;  Laterality: N/A;   POLYPECTOMY  2009  sigmoid polyps   SHOULDER ARTHROSCOPY W/ ROTATOR CUFF REPAIR Right 06/06/2015   [facility]   TONSILLECTOMY     TUBAL LIGATION      Social History   Socioeconomic History   Marital status: Divorced    Spouse name: Not on file   Number of children: 2   Years of education: Not on file   Highest education level: Not on file  Occupational History   Occupation: Nurse  Tobacco Use   Smoking status: Never   Smokeless tobacco: Never  Vaping Use   Vaping Use: Never used  Substance and Sexual Activity   Alcohol use: Yes    Comment: rare   Drug use: No   Sexual activity: Not Currently    Partners: Male  Other Topics Concern   Not on file  Social History Narrative   Buyer, retail  , and pt daily   Social Determinants of Health   Financial Resource Strain: Not on file  Food Insecurity: Not on file  Transportation Needs: Not on file  Physical Activity: Not on file  Stress: Not on file  Social Connections: Not on file  Intimate Partner Violence: Not on file    Current Outpatient Medications on File Prior to Visit  Medication Sig Dispense Refill   aspirin 81 MG chewable tablet Chew 81 mg by mouth at bedtime.     atenolol (TENORMIN) 25 MG tablet TAKE 1 TABLET BY MOUTH EVERY DAY 90 tablet 3   atorvastatin (LIPITOR) 40 MG tablet TAKE 1 TABLET BY MOUTH EVERYDAY AT BEDTIME 90 tablet 1   benzonatate (TESSALON) 100 MG capsule Take 1 capsule (100 mg total) by mouth 3 (three) times daily as needed. 20 capsule 0   bromocriptine (PARLODEL) 2.5 MG tablet Take by mouth daily. 1/0 tab po qd     Calcium Carbonate-Vitamin D (OSCAL 500/200 D-3 PO) Take 1 tablet by mouth at bedtime.     Continuous Blood Gluc Sensor (FREESTYLE LIBRE 14 DAY SENSOR) MISC 1 each by Does not apply route every 14 (fourteen) days. 6 each 3   CRANBERRY-VITAMIN C PO Take 2 capsules by mouth at bedtime.     fenofibrate 160 MG tablet Take 1 tablet (160 mg total) by mouth daily. 90 tablet 1   Flaxseed, Linseed, (FLAXSEED OIL PO) Take 1,400 mg by mouth at bedtime.     Ipratropium-Albuterol (COMBIVENT RESPIMAT) 20-100 MCG/ACT AERS respimat Inhale 1 puff into the lungs 4 (four) times daily as needed for wheezing or shortness of breath. 4 g 5   mometasone (NASONEX) 50 MCG/ACT nasal spray Place 2 sprays into the nose daily.     montelukast (SINGULAIR) 10 MG tablet Take 1 tablet (10 mg total) by mouth daily. 90 tablet 3   ondansetron (ZOFRAN) 8 MG tablet Take 1 tablet (8 mg total) by mouth every 8 (eight) hours as needed for nausea or vomiting. 20 tablet 0   pantoprazole (PROTONIX) 40 MG tablet Take 1 tablet (40 mg total) by mouth 2 (two) times daily. 180 tablet 3   Propylene Glycol 0.6 % SOLN Apply 1 drop to eye 4  (four) times daily as needed for dry eyes (dry/irritated eyes).     tirzepatide (MOUNJARO) 10 MG/0.5ML Pen Inject 10 mg into the skin once a week. 2 mL 0   Vitamin D, Ergocalciferol, (DRISDOL) 1.25 MG (50000 UNIT) CAPS capsule Take 1 capsule (50,000 Units total) by mouth every 7 (seven) days. 12 capsule 1   nitroGLYCERIN (NITROSTAT) 0.4 MG SL tablet Place 1 tablet (  0.4 mg total) under the tongue every 5 (five) minutes as needed for chest pain. 25 tablet 3   No current facility-administered medications on file prior to visit.    Allergies  Allergen Reactions   Contrast Media [Iodinated Contrast Media] Anaphylaxis   Metamucil [Psyllium] Anaphylaxis   Mustard Seed Anaphylaxis    Family History  Problem Relation Age of Onset   Rheumatic fever Mother    Irritable bowel syndrome Mother    Breast cancer Mother    Diabetes Mother    High blood pressure Mother    High Cholesterol Mother    Heart disease Mother    Stroke Mother    Sudden death Mother    Obesity Mother    Cancer Father    Diabetes Father    Colon polyps Father    Kidney disease Father    High blood pressure Father    High Cholesterol Father    Heart disease Father    Obesity Father    Cervical cancer Maternal Grandmother    Breast cancer Maternal Grandmother    Colon cancer Maternal Grandmother    Uterine cancer Maternal Grandmother    Coronary artery disease Other    Hyperlipidemia Other    Hypertension Other    Asthma Other    Esophageal cancer Neg Hx    Rectal cancer Neg Hx    Stomach cancer Neg Hx     BP 118/80    Pulse 78    Ht 5\' 1"  (1.549 m)    Wt 158 lb 6.4 oz (71.8 kg)    SpO2 96%    BMI 29.93 kg/m    Review of Systems Denies N/V/HB    Objective:   Physical Exam VITAL SIGNS:  See vs page GENERAL: no distress.     A1c=6.6%    Assessment & Plan:  Type 2 DM: well-controlled  Patient Instructions  Please continue the same 2 diabetes medications   check your blood sugar once a day.  vary  the time of day when you check, between before the 3 meals, and at bedtime.  also check if you have symptoms of your blood sugar being too high or too low.  please keep a record of the readings and bring it to your next appointment here (or you can bring the meter itself).  You can write it on any piece of paper.  please call us sooner if your blood sugar goes below 70, or if most of your readings are over 200.   Please come back for a follow-up appointment in May.

## 2021-03-18 NOTE — Patient Instructions (Signed)
Please continue the same 2 diabetes medications   check your blood sugar once a day.  vary the time of day when you check, between before the 3 meals, and at bedtime.  also check if you have symptoms of your blood sugar being too high or too low.  please keep a record of the readings and bring it to your next appointment here (or you can bring the meter itself).  You can write it on any piece of paper.  please call us sooner if your blood sugar goes below 70, or if most of your readings are over 200.   Please come back for a follow-up appointment in May.

## 2021-04-08 ENCOUNTER — Ambulatory Visit (INDEPENDENT_AMBULATORY_CARE_PROVIDER_SITE_OTHER): Payer: BC Managed Care – PPO | Admitting: Bariatrics

## 2021-04-08 ENCOUNTER — Other Ambulatory Visit: Payer: Self-pay

## 2021-04-08 ENCOUNTER — Encounter (INDEPENDENT_AMBULATORY_CARE_PROVIDER_SITE_OTHER): Payer: Self-pay | Admitting: Bariatrics

## 2021-04-08 VITALS — BP 109/69 | HR 89 | Temp 98.2°F | Ht 61.0 in | Wt 153.0 lb

## 2021-04-08 DIAGNOSIS — E1165 Type 2 diabetes mellitus with hyperglycemia: Secondary | ICD-10-CM | POA: Diagnosis not present

## 2021-04-08 DIAGNOSIS — Z6829 Body mass index (BMI) 29.0-29.9, adult: Secondary | ICD-10-CM

## 2021-04-08 DIAGNOSIS — E785 Hyperlipidemia, unspecified: Secondary | ICD-10-CM | POA: Diagnosis not present

## 2021-04-08 DIAGNOSIS — R7989 Other specified abnormal findings of blood chemistry: Secondary | ICD-10-CM | POA: Diagnosis not present

## 2021-04-08 DIAGNOSIS — R748 Abnormal levels of other serum enzymes: Secondary | ICD-10-CM

## 2021-04-08 DIAGNOSIS — E1169 Type 2 diabetes mellitus with other specified complication: Secondary | ICD-10-CM

## 2021-04-08 DIAGNOSIS — Z7985 Long-term (current) use of injectable non-insulin antidiabetic drugs: Secondary | ICD-10-CM

## 2021-04-08 DIAGNOSIS — E559 Vitamin D deficiency, unspecified: Secondary | ICD-10-CM | POA: Diagnosis not present

## 2021-04-08 DIAGNOSIS — E669 Obesity, unspecified: Secondary | ICD-10-CM

## 2021-04-08 MED ORDER — TIRZEPATIDE 10 MG/0.5ML ~~LOC~~ SOAJ: 10.0000 mg | SUBCUTANEOUS | 0 refills | Status: DC

## 2021-04-09 LAB — COMPREHENSIVE METABOLIC PANEL
ALT: 31 IU/L (ref 0–32)
AST: 24 IU/L (ref 0–40)
Albumin/Globulin Ratio: 2.2 (ref 1.2–2.2)
Albumin: 4.7 g/dL (ref 3.8–4.8)
Alkaline Phosphatase: 107 IU/L (ref 44–121)
BUN/Creatinine Ratio: 29 — ABNORMAL HIGH (ref 12–28)
BUN: 27 mg/dL (ref 8–27)
Bilirubin Total: 0.7 mg/dL (ref 0.0–1.2)
CO2: 24 mmol/L (ref 20–29)
Calcium: 10 mg/dL (ref 8.7–10.3)
Chloride: 105 mmol/L (ref 96–106)
Creatinine, Ser: 0.93 mg/dL (ref 0.57–1.00)
Globulin, Total: 2.1 g/dL (ref 1.5–4.5)
Glucose: 128 mg/dL — ABNORMAL HIGH (ref 70–99)
Potassium: 4.8 mmol/L (ref 3.5–5.2)
Sodium: 142 mmol/L (ref 134–144)
Total Protein: 6.8 g/dL (ref 6.0–8.5)
eGFR: 69 mL/min/{1.73_m2} (ref >59)

## 2021-04-09 LAB — HEMOGLOBIN A1C
Est. average glucose Bld gHb Est-mCnc: 146 mg/dL
Hgb A1c MFr Bld: 6.7 % — ABNORMAL HIGH (ref 4.8–5.6)

## 2021-04-09 LAB — LIPID PANEL WITH LDL/HDL RATIO
Cholesterol, Total: 150 mg/dL (ref 100–199)
HDL: 53 mg/dL (ref >39)
LDL Chol Calc (NIH): 79 mg/dL (ref 0–99)
LDL/HDL Ratio: 1.5 ratio (ref 0.0–3.2)
Triglycerides: 97 mg/dL (ref 0–149)
VLDL Cholesterol Cal: 18 mg/dL (ref 5–40)

## 2021-04-09 LAB — INSULIN, RANDOM: INSULIN: 41.3 u[IU]/mL — ABNORMAL HIGH (ref 2.6–24.9)

## 2021-04-09 LAB — VITAMIN D 25 HYDROXY (VIT D DEFICIENCY, FRACTURES): Vit D, 25-Hydroxy: 84.9 ng/mL (ref 30.0–100.0)

## 2021-04-10 ENCOUNTER — Encounter (INDEPENDENT_AMBULATORY_CARE_PROVIDER_SITE_OTHER): Payer: Self-pay | Admitting: Bariatrics

## 2021-04-10 NOTE — Progress Notes (Signed)
? ? ? ?Chief Complaint:  ? ?OBESITY ?Nicole Richard is here to discuss her progress with her obesity treatment plan along with follow-up of her obesity related diagnoses. Nicole Richard is on the Category 2 Plan and states she is following her eating plan approximately 100% of the time. Nicole Richard states she is doing 0 minutes 0 times per week. ? ?Today's visit was #: 14 ?Starting weight: 163 lbs ?Starting date: 05/13/2020 ?Today's weight: 153 lbs ?Today's date: 04/08/2021 ?Total lbs lost to date: 10 lbs ?Total lbs lost since last in-office visit: 2 lbs ? ?Interim History: Nicole Richard is down another 2 lbs. She is doing okay. ? ?Subjective:  ? ?1. Type 2 diabetes mellitus with hyperglycemia, without long-term current use of insulin (Nicole Richard) ?Nicole Richard is currently taking Mounjaro.  ? ?2. Hyperlipidemia associated with type 2 diabetes mellitus (Nicole Richard) ?Nicole Richard is taking Nicole Richard currently. ? ?3. Abnormal liver enzymes ?Nicole Richard is not on medications currently. She has a history of abnormal liver enzymes.  ? ?4. Vitamin D insufficiency ?Nicole Richard is taking Vitamin D currently.  ? ?Assessment/Plan:  ? ?1. Type 2 diabetes mellitus with hyperglycemia, without long-term current use of insulin (Nicole Richard) ?We will refill Mounjaro 10 mg for 1 month with no refills. We will check A1C and insulin today. Good blood sugar control is important to decrease the likelihood of diabetic complications such as nephropathy, neuropathy, limb loss, blindness, coronary artery disease, and death. Intensive lifestyle modification including diet, exercise and weight loss are the first line of treatment for diabetes.  ? ?- tirzepatide (MOUNJARO) 10 MG/0.5ML Pen; Inject 10 mg into the skin once a week.  Dispense: 2 mL; Refill: 0 ?- Hemoglobin A1c ?- Insulin, random ? ?2. Hyperlipidemia associated with type 2 diabetes mellitus (Nicole Richard) ?Cardiovascular risk and specific lipid/LDL goals reviewed.  Nicole Richard will continue taking Nicole Richard. She will read labels for trans fats and saturated fats. We will  check Lipids today. We discussed several lifestyle modifications today and Nicole Richard will continue to work on diet, exercise and weight loss efforts. Orders and follow up as documented in patient record.  ? ?Counseling ?Intensive lifestyle modifications are the first line treatment for this issue. ?Dietary changes: Increase soluble fiber. Decrease simple carbohydrates. ?Exercise changes: Moderate to vigorous-intensity aerobic activity 150 minutes per week if tolerated. ?Lipid-lowering medications: see documented in medical record. ? ?- Lipid Panel With LDL/HDL Ratio ? ?3. Abnormal liver enzymes ?We will check CMP today. We discussed the likely diagnosis of non-alcoholic fatty liver disease today and how this condition is obesity related. Nicole Richard was educated the importance of weight loss. Nicole Richard agreed to continue with her weight loss efforts with healthier diet and exercise as an essential part of her treatment plan.  ? ?- Comprehensive metabolic panel ? ?4. Vitamin D insufficiency ?Low Vitamin D level contributes to fatigue and are associated with obesity, breast, and colon cancer. We will check Vitamin D today and Nicole Richard will follow-up for routine testing of Vitamin D, at least 2-3 times per year to avoid over-replacement. ? ?- VITAMIN D 25 Hydroxy (Vit-D Deficiency, Fractures) ? ?5. Obesity BMI today is 29.0 ?Nicole Richard is currently in the action stage of change. As such, her goal is to continue with weight loss efforts. She has agreed to the Category 2 Plan.  ? ?Nicole Richard will continue meal planning and she will continue intentional eating. ? ?Exercise goals:  Nicole Richard will be more active.  ? ?Behavioral modification strategies: increasing lean protein intake, decreasing simple carbohydrates, increasing vegetables, increasing water intake, decreasing eating out, no  skipping meals, meal planning and cooking strategies, keeping healthy foods in the home, and planning for success. ? ?Nicole Richard has agreed to follow-up with our  clinic in 4 weeks. She was informed of the importance of frequent follow-up visits to maximize her success with intensive lifestyle modifications for her multiple health conditions.  ? ?Nicole Richard was informed we would discuss her lab results at her next visit unless there is a critical issue that needs to be addressed sooner. Nicole Richard agreed to keep her next visit at the agreed upon time to discuss these results. ? ?Objective:  ? ?Blood pressure 109/69, pulse 89, temperature 98.2 ?F (36.8 ?C), height '5\' 1"'$  (1.549 m), weight 153 lb (69.4 kg), SpO2 100 %. ?Body mass index is 28.91 kg/m?. ? ?General: Cooperative, alert, well developed, in no acute distress. ?HEENT: Conjunctivae and lids unremarkable. ?Cardiovascular: Regular rhythm.  ?Lungs: Normal work of breathing. ?Neurologic: No focal deficits.  ? ?Lab Results  ?Component Value Date  ? CREATININE 0.93 04/08/2021  ? BUN 27 04/08/2021  ? NA 142 04/08/2021  ? K 4.8 04/08/2021  ? CL 105 04/08/2021  ? CO2 24 04/08/2021  ? ?Lab Results  ?Component Value Date  ? ALT 31 04/08/2021  ? AST 24 04/08/2021  ? ALKPHOS 107 04/08/2021  ? BILITOT 0.7 04/08/2021  ? ?Lab Results  ?Component Value Date  ? HGBA1C 6.7 (H) 04/08/2021  ? HGBA1C 6.6 (A) 03/18/2021  ? HGBA1C 6.5 (A) 12/18/2020  ? HGBA1C 7.0 (H) 10/26/2020  ? HGBA1C 6.3 (A) 09/13/2020  ? ?Lab Results  ?Component Value Date  ? INSULIN 41.3 (H) 04/08/2021  ? INSULIN 46.9 (H) 05/16/2020  ? ?Lab Results  ?Component Value Date  ? TSH 1.05 10/26/2020  ? ?Lab Results  ?Component Value Date  ? CHOL 150 04/08/2021  ? HDL 53 04/08/2021  ? [place] 79 04/08/2021  ? LDLDIRECT 128.1 10/05/2013  ? TRIG 97 04/08/2021  ? CHOLHDL 3 10/26/2020  ? ?Lab Results  ?Component Value Date  ? VD25OH 84.9 04/08/2021  ? VD25OH 63.69 10/26/2020  ? VD25OH 26.5 (L) 05/16/2020  ? ?Lab Results  ?Component Value Date  ? WBC 6.6 10/26/2020  ? HGB 14.5 10/26/2020  ? HCT 43.7 10/26/2020  ? MCV 88.6 10/26/2020  ? PLT 337.0 10/26/2020  ? ?No results found for: IRON,  TIBC, FERRITIN ? ?Attestation Statements:  ? ?Reviewed by clinician on day of visit: allergies, medications, problem list, medical history, surgical history, family history, social history, and previous encounter notes. ? ?I, Lizbeth Bark, RMA, am acting as transcriptionist for [employer], DO. ? ?I have reviewed the above documentation for accuracy and completeness, and I agree with the above. Jearld Lesch, DO ? ?

## 2021-04-26 ENCOUNTER — Ambulatory Visit: Payer: BC Managed Care – PPO | Admitting: Family Medicine

## 2021-04-28 ENCOUNTER — Other Ambulatory Visit: Payer: Self-pay | Admitting: Endocrinology

## 2021-05-07 ENCOUNTER — Encounter (INDEPENDENT_AMBULATORY_CARE_PROVIDER_SITE_OTHER): Payer: Self-pay | Admitting: Bariatrics

## 2021-05-07 ENCOUNTER — Ambulatory Visit (INDEPENDENT_AMBULATORY_CARE_PROVIDER_SITE_OTHER): Payer: BC Managed Care – PPO | Admitting: Bariatrics

## 2021-05-07 VITALS — BP 126/79 | HR 75 | Temp 98.0°F | Ht 61.0 in | Wt 152.0 lb

## 2021-05-07 DIAGNOSIS — E1165 Type 2 diabetes mellitus with hyperglycemia: Secondary | ICD-10-CM

## 2021-05-07 DIAGNOSIS — E1169 Type 2 diabetes mellitus with other specified complication: Secondary | ICD-10-CM

## 2021-05-07 DIAGNOSIS — E669 Obesity, unspecified: Secondary | ICD-10-CM

## 2021-05-07 DIAGNOSIS — E785 Hyperlipidemia, unspecified: Secondary | ICD-10-CM | POA: Diagnosis not present

## 2021-05-07 DIAGNOSIS — E559 Vitamin D deficiency, unspecified: Secondary | ICD-10-CM | POA: Diagnosis not present

## 2021-05-07 DIAGNOSIS — Z7985 Long-term (current) use of injectable non-insulin antidiabetic drugs: Secondary | ICD-10-CM

## 2021-05-07 DIAGNOSIS — Z6828 Body mass index (BMI) 28.0-28.9, adult: Secondary | ICD-10-CM

## 2021-05-07 MED ORDER — TIRZEPATIDE 10 MG/0.5ML ~~LOC~~ SOAJ: 10.0000 mg | SUBCUTANEOUS | 0 refills | Status: DC

## 2021-05-16 ENCOUNTER — Other Ambulatory Visit: Payer: Self-pay | Admitting: Family Medicine

## 2021-05-16 DIAGNOSIS — E1169 Type 2 diabetes mellitus with other specified complication: Secondary | ICD-10-CM

## 2021-05-17 ENCOUNTER — Encounter: Payer: Self-pay | Admitting: Family Medicine

## 2021-05-17 ENCOUNTER — Ambulatory Visit: Payer: BC Managed Care – PPO | Admitting: Family Medicine

## 2021-05-17 ENCOUNTER — Other Ambulatory Visit: Payer: Self-pay | Admitting: Family Medicine

## 2021-05-17 VITALS — BP 110/60 | HR 75 | Temp 98.4°F | Resp 18 | Ht 61.0 in | Wt 156.4 lb

## 2021-05-17 DIAGNOSIS — Z1231 Encounter for screening mammogram for malignant neoplasm of breast: Secondary | ICD-10-CM

## 2021-05-17 DIAGNOSIS — E1169 Type 2 diabetes mellitus with other specified complication: Secondary | ICD-10-CM | POA: Diagnosis not present

## 2021-05-17 DIAGNOSIS — E785 Hyperlipidemia, unspecified: Secondary | ICD-10-CM

## 2021-05-17 DIAGNOSIS — E1165 Type 2 diabetes mellitus with hyperglycemia: Secondary | ICD-10-CM

## 2021-05-17 DIAGNOSIS — I10 Essential (primary) hypertension: Secondary | ICD-10-CM

## 2021-05-17 NOTE — Progress Notes (Signed)
? ? ? ?Chief Complaint:  ? ?OBESITY ?Nicole Richard is here to discuss her progress with her obesity treatment plan along with follow-up of her obesity related diagnoses. Valoria is on the Category 2 Plan and states she is following her eating plan approximately 95% of the time. Surabhi states she is walking for 30 minutes 2 times per week. ? ?Today's visit was #: 15 ?Starting weight: 163 lbs ?Starting date: 05/13/2020 ?Today's weight: 152 lbs ?Today's date: 05/07/2021 ?Total lbs lost to date: 11 lbs ?Total lbs lost since last in-office visit: 1 lb ? ?Interim History: [name] is down 1 lb. She states that she cheated over Easter.  ? ?Subjective:  ? ?1. Type 2 diabetes mellitus with hyperglycemia, without long-term current use of insulin ([place]) ?Kalasia denies side effects from [facility]. Her last A1C was 6.7. ? ?2. Hyperlipidemia associated with type 2 diabetes mellitus ([place]) ?Journie is currently taking Lipitor.  ? ?3. Vitamin D deficiency ?Cythnia stopped taking prescription Vitamin D.  ? ?Assessment/Plan:  ? ?1. Type 2 diabetes mellitus with hyperglycemia, without long-term current use of insulin ([place]) ?We will refill Mounjaro 10 mg for 1 month with no refills. Good blood sugar control is important to decrease the likelihood of diabetic complications such as nephropathy, neuropathy, limb loss, blindness, coronary artery disease, and death. Intensive lifestyle modification including diet, exercise and weight loss are the first line of treatment for diabetes.  ? ?- tirzepatide (MOUNJARO) 10 MG/0.5ML Pen; Inject 10 mg into the skin once a week.  Dispense: 2 mL; Refill: 0 ? ?2. Hyperlipidemia associated with type 2 diabetes mellitus ([place]) ?Cardiovascular risk and specific lipid/LDL goals reviewed.  Janeene will continue taking Lipitor. We discussed several lifestyle modifications today and Ilisha will continue to work on diet, exercise and weight loss efforts. Orders and follow up as documented in patient record.   ? ?Counseling ?Intensive lifestyle modifications are the first line treatment for this issue. ?Dietary changes: Increase soluble fiber. Decrease simple carbohydrates. ?Exercise changes: Moderate to vigorous-intensity aerobic activity 150 minutes per week if tolerated. ?Lipid-lowering medications: see documented in medical record. ? ?3. Vitamin D deficiency ?Low Vitamin D level contributes to fatigue and are associated with obesity, breast, and colon cancer. [name] will continue Calcium Carbonate-Vitamin D and she will follow-up for routine testing of Vitamin D, at least 2-3 times per year to avoid over-replacement.She stopped prescription Vitamin D.  ? ?4. Obesity BMI today is 28.8 ?[name] is currently in the action stage of change. As such, her goal is to continue with weight loss efforts. She has agreed to the Category 2 Plan.  ? ?[name] will increase her water intake. She will continue to adhere to the plan closely. Smart [employer] handouts was provided to day. We reviewed labs from 04/08/2021 CMP and Lipid panel.  ? ?Exercise goals:  As is. [name] will use hand weights when walking.  ? ?Behavioral modification strategies: increasing lean protein intake, decreasing simple carbohydrates, increasing vegetables, increasing water intake, decreasing eating out, no skipping meals, meal planning and cooking strategies, keeping healthy foods in the home, and planning for success. ? ?[name] has agreed to follow-up with our clinic in 4 weeks. She was informed of the importance of frequent follow-up visits to maximize her success with intensive lifestyle modifications for her multiple health conditions.  ? ?Objective:  ? ?Blood pressure 126/79, pulse 75, temperature 98 ?F (36.7 ?C), height '5\' 1"'$  (1.549 m), weight 152 lb (68.9 kg), SpO2 97 %. ?Body mass index is 28.72 kg/m?. ? ?General:  Cooperative, alert, well developed, in no acute distress. ?HEENT: Conjunctivae and lids unremarkable. ?Cardiovascular: Regular rhythm.  ?Lungs:  Normal work of breathing. ?Neurologic: No focal deficits.  ? ?Lab Results  ?Component Value Date  ? CREATININE 0.93 04/08/2021  ? BUN 27 04/08/2021  ? NA 142 04/08/2021  ? K 4.8 04/08/2021  ? CL 105 04/08/2021  ? CO2 24 04/08/2021  ? ?Lab Results  ?Component Value Date  ? ALT 31 04/08/2021  ? AST 24 04/08/2021  ? ALKPHOS 107 04/08/2021  ? BILITOT 0.7 04/08/2021  ? ?Lab Results  ?Component Value Date  ? HGBA1C 6.7 (H) 04/08/2021  ? HGBA1C 6.6 (A) 03/18/2021  ? HGBA1C 6.5 (A) 12/18/2020  ? HGBA1C 7.0 (H) 10/26/2020  ? HGBA1C 6.3 (A) 09/13/2020  ? ?Lab Results  ?Component Value Date  ? INSULIN 41.3 (H) 04/08/2021  ? INSULIN 46.9 (H) 05/16/2020  ? ?Lab Results  ?Component Value Date  ? TSH 1.05 10/26/2020  ? ?Lab Results  ?Component Value Date  ? CHOL 150 04/08/2021  ? HDL 53 04/08/2021  ? [place] 79 04/08/2021  ? LDLDIRECT 128.1 10/05/2013  ? TRIG 97 04/08/2021  ? CHOLHDL 3 10/26/2020  ? ?Lab Results  ?Component Value Date  ? VD25OH 84.9 04/08/2021  ? VD25OH 63.69 10/26/2020  ? VD25OH 26.5 (L) 05/16/2020  ? ?Lab Results  ?Component Value Date  ? WBC 6.6 10/26/2020  ? HGB 14.5 10/26/2020  ? HCT 43.7 10/26/2020  ? MCV 88.6 10/26/2020  ? PLT 337.0 10/26/2020  ? ?No results found for: IRON, TIBC, FERRITIN ? ?Attestation Statements:  ? ?Reviewed by clinician on day of visit: allergies, medications, problem list, medical history, surgical history, family history, social history, and previous encounter notes. ? ?I, Lizbeth Bark, RMA, am acting as transcriptionist for [employer], DO. ? ?I have reviewed the above documentation for accuracy and completeness, and I agree with the above. Jearld Lesch, DO ? ?

## 2021-05-17 NOTE — Assessment & Plan Note (Signed)
hgba1c to be checked, minimize simple carbs. Increase exercise as tolerated. Continue current meds  

## 2021-05-17 NOTE — Progress Notes (Signed)
? ?Subjective:  ? ?By signing my name below, I, Zite Okoli, attest that this documentation has been prepared under the direction and in the presence of [name], DO. 05/17/2021  ? ? Patient ID: Nicole Richard, female    DOB: 13-Mar-1955, 66 y.o.   MRN: [id number] ? ?Chief Complaint  ?Patient presents with  ? Hyperlipidemia  ? Diabetes  ? Follow-up  ? ? ?HPI ?Patient is in today for an office visit and 6 month f/u. ? ?She is doing good on mounjaro and has lost about 10 pounds since she started. She has had no side effects while increasing her dosage. ? ?Last lab work was in March 2023. ? ?She is UTD on shingles and pneumonia vaccines.  ? ?Past Medical History:  ?Diagnosis Date  ? Abdominal pain, left upper quadrant 03/01/2013  ? Abnormal liver function tests 03/01/2013  ? Allergy   ? seasonal  ? Angina at rest [facility]) 03/21/2020  ? Angina pectoris ([place]) 03/21/2020  ? Asthma   ? Back pain   ? BREAST IMPLANTS, BILATERAL, HX OF 07/28/2007  ? Qualifier: Diagnosis of  By: [name]    ? 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 Qualifier: Diagnosis of  By: [name]    ? Diabetes ([place]) 01/29/2014  ? Diabetes mellitus without complication ([place])   ? Edema of both lower extremities   ? ESOPHAGEAL STRICTURE 11/09/2009  ? Qualifier: Diagnosis of  By: [name] Deborra Medina), [name]    ? Essential hypertension 05/18/2006  ? Qualifier: Diagnosis of  By: [name]    ? Extrinsic asthma with exacerbation, unspecified asthma severity 10/26/2020  ? GERD 09/14/2009  ? Qualifier: Diagnosis of  By: [name]    ? GERD (gastroesophageal reflux disease)   ? HEADACHE 05/18/2006  ? Qualifier: Diagnosis of  By: [name]    ? Hirsutism 05/18/2006  ? Qualifier: Diagnosis of  By: [name]    ? Hyperlipidemia   ? Hyperlipidemia associated with type 2 diabetes mellitus ([place]) 04/15/2019  ? Hypertension   ? Kidney stones   ? LAPAROSCOPY, HX OF 05/18/2006  ? Qualifier: Diagnosis of  By: [name]    ? Mixed dyslipidemia 06/22/2020  ? Nonspecific (abnormal) findings on radiological and other examination of biliary tract 03/01/2013  ? Obesity (BMI 30-39.9) 10/18/2012  ? Other chest pain 03/21/2020  ? Palpitation 03/21/2020  ? Palpitations   ? POSTMENOPAUSAL STATUS 09/14/2009  ? Qualifier: Diagnosis of  By: [name]    ? Preventative health care 10/26/2020  ? Seasonal allergic rhinitis 10/26/2020  ? SKIN TAG 10/05/2009  ? Qualifier: Diagnosis of  By: [name]    ? SOB (shortness of breath)   ? Uncontrolled type 2 diabetes mellitus with hyperglycemia ([place]) 04/15/2019  ? Vitamin D deficiency   ? Vitamin D insufficiency 05/21/2020  ? ? ?Past Surgical History:  ?Procedure Laterality Date  ? ABDOMINAL HYSTERECTOMY  08/2004  ? APPENDECTOMY    ? AUGMENTATION MAMMAPLASTY Bilateral 2000  ? BREAST ENHANCEMENT SURGERY  2001  ? COLONOSCOPY    ? LEFT HEART CATH AND CORONARY ANGIOGRAPHY N/A 04/09/2020  ? Procedure: LEFT HEART CATH AND CORONARY ANGIOGRAPHY;  Surgeon: Leonie Man, MD;  Location: [facility];  Service: Cardiovascular;  Laterality: N/A;  ? POLYPECTOMY  2009  ? sigmoid polyps  ? SHOULDER ARTHROSCOPY W/ ROTATOR CUFF REPAIR Right 06/06/2015  ? [place]  ? TONSILLECTOMY    ?  TUBAL LIGATION    ? ? ?Family History  ?Problem Relation Age of Onset  ? Rheumatic fever Mother   ? Irritable bowel syndrome Mother   ? Breast cancer Mother   ? Diabetes Mother   ? High blood pressure Mother   ? High Cholesterol Mother   ? Heart disease Mother   ? Stroke Mother   ? Sudden death Mother   ? Obesity Mother   ? Cancer Father   ? Diabetes Father   ? Colon polyps Father   ? Kidney disease Father   ? High blood pressure Father   ? High Cholesterol Father   ? Heart disease Father   ? Obesity Father   ? Cervical cancer Maternal Grandmother   ? Breast cancer Maternal Grandmother   ? Colon cancer Maternal Grandmother   ? Uterine cancer Maternal Grandmother   ? Coronary artery disease Other   ? Hyperlipidemia Other   ?  Hypertension Other   ? Asthma Other   ? Esophageal cancer Neg Hx   ? Rectal cancer Neg Hx   ? Stomach cancer Neg Hx   ? ? ?Social History  ? ?Socioeconomic History  ? Marital status: Divorced  ?  Spouse name: Not on file  ? Number of children: 2  ? Years of education: Not on file  ? Highest education level: Not on file  ?Occupational History  ? Occupation: Nurse  ?Tobacco Use  ? Smoking status: Never  ? Smokeless tobacco: Never  ?Vaping Use  ? Vaping Use: Never used  ?Substance and Sexual Activity  ? Alcohol use: Yes  ?  Comment: rare  ? Drug use: No  ? Sexual activity: Not Currently  ?  Partners: Male  ?Other Topics Concern  ? Not on file  ?Social History Narrative  ? Exercise--walking , and pt daily  ? ?Social Determinants of Health  ? ?Financial Resource Strain: Not on file  ?Food Insecurity: Not on file  ?Transportation Needs: Not on file  ?Physical Activity: Not on file  ?Stress: Not on file  ?Social Connections: Not on file  ?Intimate Partner Violence: Not on file  ? ? ?Outpatient Medications Prior to Visit  ?Medication Sig Dispense Refill  ? aspirin 81 MG chewable tablet Chew 81 mg by mouth at bedtime.    ? atenolol (TENORMIN) 25 MG tablet TAKE 1 TABLET BY MOUTH EVERY DAY 90 tablet 3  ? atorvastatin (LIPITOR) 40 MG tablet TAKE 1 TABLET BY MOUTH EVERYDAY AT BEDTIME 90 tablet 1  ? bromocriptine (PARLODEL) 2.5 MG tablet TAKE 0.5 TABLETS BY MOUTH DAILY. 45 tablet 3  ? Calcium Carbonate-Vitamin D (OSCAL 500/200 D-3 PO) Take 1 tablet by mouth at bedtime.    ? Continuous Blood Gluc Sensor (FREESTYLE LIBRE 14 DAY SENSOR) MISC 1 each by Does not apply route every 14 (fourteen) days. 6 each 3  ? CRANBERRY-VITAMIN C PO Take 2 capsules by mouth at bedtime.    ? fenofibrate 160 MG tablet TAKE 1 TABLET BY MOUTH EVERY DAY 90 tablet 1  ? Flaxseed, Linseed, (FLAXSEED OIL PO) Take 1,400 mg by mouth at bedtime.    ? Ipratropium-Albuterol (COMBIVENT RESPIMAT) 20-100 MCG/ACT AERS respimat Inhale 1 puff into the lungs 4 (four)  times daily as needed for wheezing or shortness of breath. 4 g 5  ? mometasone (NASONEX) 50 MCG/ACT nasal spray Place 2 sprays into the nose daily.    ? montelukast (SINGULAIR) 10 MG tablet Take 1 tablet (10 mg total) by mouth daily.  90 tablet 3  ? ondansetron (ZOFRAN) 8 MG tablet Take 1 tablet (8 mg total) by mouth every 8 (eight) hours as needed for nausea or vomiting. 20 tablet 0  ? pantoprazole (PROTONIX) 40 MG tablet Take 1 tablet (40 mg total) by mouth 2 (two) times daily. 180 tablet 3  ? Propylene Glycol 0.6 % SOLN Apply 1 drop to eye 4 (four) times daily as needed for dry eyes (dry/irritated eyes).    ? tirzepatide (MOUNJARO) 10 MG/0.5ML Pen Inject 10 mg into the skin once a week. 2 mL 0  ? nitroGLYCERIN (NITROSTAT) 0.4 MG SL tablet Place 1 tablet (0.4 mg total) under the tongue every 5 (five) minutes as needed for chest pain. 25 tablet 3  ? benzonatate (TESSALON) 100 MG capsule Take 1 capsule (100 mg total) by mouth 3 (three) times daily as needed. (Patient not taking: Reported on 05/17/2021) 20 capsule 0  ? ?No facility-administered medications prior to visit.  ? ? ?Allergies  ?Allergen Reactions  ? Contrast Media [Iodinated Contrast Media] Anaphylaxis  ? Metamucil [Psyllium] Anaphylaxis  ? Mustard Seed Anaphylaxis  ? ? ?Review of Systems  ?Constitutional:  Negative for fever.  ?HENT:  Negative for congestion, ear pain, hearing loss, sinus pain and sore throat.   ?Eyes:  Negative for blurred vision and pain.  ?Respiratory:  Negative for cough, sputum production, shortness of breath and wheezing.   ?Cardiovascular:  Negative for chest pain and palpitations.  ?Gastrointestinal:  Negative for blood in stool, constipation, diarrhea, nausea and vomiting.  ?Genitourinary:  Negative for dysuria, frequency, hematuria and urgency.  ?Musculoskeletal:  Negative for back pain, falls and myalgias.  ?Neurological:  Negative for dizziness, sensory change, loss of consciousness, weakness and headaches.   ?Endo/Heme/Allergies:  Negative for environmental allergies. Does not bruise/bleed easily.  ?Psychiatric/Behavioral:  Negative for depression and suicidal ideas. The patient is not nervous/anxious and does not have insomnia.   ? ?

## 2021-05-17 NOTE — Assessment & Plan Note (Signed)
Well controlled, no changes to meds. Encouraged heart healthy diet such as the DASH diet and exercise as tolerated.  °

## 2021-05-17 NOTE — Patient Instructions (Signed)

## 2021-05-17 NOTE — Assessment & Plan Note (Signed)
Encourage heart healthy diet such as MIND or DASH diet, increase exercise, avoid trans fats, simple carbohydrates and processed foods, consider a krill or fish or flaxseed oil cap daily.  °

## 2021-05-22 ENCOUNTER — Encounter (INDEPENDENT_AMBULATORY_CARE_PROVIDER_SITE_OTHER): Payer: Self-pay | Admitting: Bariatrics

## 2021-05-22 ENCOUNTER — Encounter: Payer: Self-pay | Admitting: Family Medicine

## 2021-05-22 LAB — HM DIABETES EYE EXAM

## 2021-05-23 NOTE — Telephone Encounter (Signed)
Exam printed and placed in folder for review. HM updated  ?

## 2021-05-30 ENCOUNTER — Ambulatory Visit: Payer: BC Managed Care – PPO | Admitting: Endocrinology

## 2021-06-12 ENCOUNTER — Ambulatory Visit (INDEPENDENT_AMBULATORY_CARE_PROVIDER_SITE_OTHER): Payer: BC Managed Care – PPO | Admitting: Bariatrics

## 2021-06-12 ENCOUNTER — Ambulatory Visit: Payer: BC Managed Care – PPO | Admitting: Internal Medicine

## 2021-06-12 ENCOUNTER — Encounter (INDEPENDENT_AMBULATORY_CARE_PROVIDER_SITE_OTHER): Payer: Self-pay | Admitting: Bariatrics

## 2021-06-12 VITALS — BP 125/78 | HR 75 | Temp 98.0°F | Ht 61.0 in | Wt 151.0 lb

## 2021-06-12 DIAGNOSIS — E1165 Type 2 diabetes mellitus with hyperglycemia: Secondary | ICD-10-CM

## 2021-06-12 DIAGNOSIS — E785 Hyperlipidemia, unspecified: Secondary | ICD-10-CM | POA: Diagnosis not present

## 2021-06-12 DIAGNOSIS — E1169 Type 2 diabetes mellitus with other specified complication: Secondary | ICD-10-CM | POA: Diagnosis not present

## 2021-06-12 DIAGNOSIS — Z7985 Long-term (current) use of injectable non-insulin antidiabetic drugs: Secondary | ICD-10-CM

## 2021-06-12 DIAGNOSIS — E669 Obesity, unspecified: Secondary | ICD-10-CM | POA: Diagnosis not present

## 2021-06-12 DIAGNOSIS — Z6828 Body mass index (BMI) 28.0-28.9, adult: Secondary | ICD-10-CM

## 2021-06-12 MED ORDER — TIRZEPATIDE 12.5 MG/0.5ML ~~LOC~~ SOAJ: 12.5000 mg | SUBCUTANEOUS | 0 refills | Status: DC

## 2021-06-18 NOTE — Progress Notes (Unsigned)
Chief Complaint:   OBESITY Nicole Richard is here to discuss her progress with her obesity treatment plan along with follow-up of her obesity related diagnoses. Nicole Richard is on the Category 2 Plan and states she is following her eating plan approximately 90% of the time. Nicole Richard states she is walking for 30 minutes 2 times per week.  Today's visit was #: 5 Starting weight: 163 lbs Starting date: 05/13/2020 Today's weight: 151 lbs Today's date: 06/12/2021 Total lbs lost to date: 12 lbs Total lbs lost since last in-office visit: 1 lb  Interim History: Nicole Richard has lost 1 lb since her last visit.   Subjective:   1. Type 2 diabetes mellitus with hyperglycemia, without long-term current use of insulin (Nicole Richard) Nicole Richard is currently taking Mounjaro. Her appetite is controlled. Her fasting blood sugar was in the range of 110-120. Her 2 hour post prandial range was 140-190.  2. Hyperlipidemia associated with type 2 diabetes mellitus (Nicole Richard) Nicole Richard is taking Lipitor currently. She denies myalgia.  Assessment/Plan:   1. Type 2 diabetes mellitus with hyperglycemia, without long-term current use of insulin (Nicole Richard) Nicole Richard agrees to increase Mounjaro from 10.0 mg to 12.5 mg for 1 month with no refills. She will decrease carbohydrates. Good blood sugar control is important to decrease the likelihood of diabetic complications such as nephropathy, neuropathy, limb loss, blindness, coronary artery disease, and death. Intensive lifestyle modification including diet, exercise and weight loss are the first line of treatment for diabetes.   - tirzepatide (MOUNJARO) 12.5 MG/0.5ML Pen; Inject 12.5 mg into the skin once a week.  Dispense: 2 mL; Refill: 0  2. Hyperlipidemia associated with type 2 diabetes mellitus (Nicole Richard) Cardiovascular risk and specific lipid/LDL goals reviewed.  Nicole Richard will continue taking Lipitor. We discussed several lifestyle modifications today and Nicole Richard will continue to work on diet, exercise and weight  loss efforts. Orders and follow up as documented in patient record.   Counseling Intensive lifestyle modifications are the first line treatment for this issue. Dietary changes: Increase soluble fiber. Decrease simple carbohydrates. Exercise changes: Moderate to vigorous-intensity aerobic activity 150 minutes per week if tolerated. Lipid-lowering medications: see documented in medical record.  3. Obesity, Current BMI 28.7 Nicole Richard is currently in the action stage of change. As such, her goal is to continue with weight loss efforts. She has agreed to the Category 2 Plan.   Nicole Richard will continue meal planning. She will keep her water intake high. She will have smaller plates.   Exercise goals:  As is.   Behavioral modification strategies: increasing lean protein intake, decreasing simple carbohydrates, increasing vegetables, increasing water intake, decreasing eating out, no skipping meals, meal planning and cooking strategies, keeping healthy foods in the home, and planning for success.  Nicole Richard has agreed to follow-up with our clinic in 4 weeks (fasting). She was informed of the importance of frequent follow-up visits to maximize her success with intensive lifestyle modifications for her multiple health conditions.   Objective:   Blood pressure 125/78, pulse 75, temperature 98 F (36.7 C), height '5\' 1"'$  (1.549 m), weight 151 lb (68.5 kg), SpO2 94 %. Body mass index is 28.53 kg/m.  General: Cooperative, alert, well developed, in no acute distress. HEENT: Conjunctivae and lids unremarkable. Cardiovascular: Regular rhythm.  Lungs: Normal work of breathing. Neurologic: No focal deficits.   Lab Results  Component Value Date   CREATININE 0.93 04/08/2021   BUN 27 04/08/2021   NA 142 04/08/2021   K 4.8 04/08/2021   CL 105 04/08/2021  CO2 24 04/08/2021   Lab Results  Component Value Date   ALT 31 04/08/2021   AST 24 04/08/2021   ALKPHOS 107 04/08/2021   BILITOT 0.7 04/08/2021   Lab  Results  Component Value Date   HGBA1C 6.7 (H) 04/08/2021   HGBA1C 6.6 (A) 03/18/2021   HGBA1C 6.5 (A) 12/18/2020   HGBA1C 7.0 (H) 10/26/2020   HGBA1C 6.3 (A) 09/13/2020   Lab Results  Component Value Date   INSULIN 41.3 (H) 04/08/2021   INSULIN 46.9 (H) 05/16/2020   Lab Results  Component Value Date   TSH 1.05 10/26/2020   Lab Results  Component Value Date   CHOL 150 04/08/2021   HDL 53 04/08/2021   LDLCALC 79 04/08/2021   LDLDIRECT 128.1 10/05/2013   TRIG 97 04/08/2021   CHOLHDL 3 10/26/2020   Lab Results  Component Value Date   VD25OH 84.9 04/08/2021   VD25OH 63.69 10/26/2020   VD25OH 26.5 (L) 05/16/2020   Lab Results  Component Value Date   WBC 6.6 10/26/2020   HGB 14.5 10/26/2020   HCT 43.7 10/26/2020   MCV 88.6 10/26/2020   PLT 337.0 10/26/2020   No results found for: IRON, TIBC, FERRITIN  Attestation Statements:   Reviewed by clinician on day of visit: allergies, medications, problem list, medical history, surgical history, family history, social history, and previous encounter notes.  I, Lizbeth Bark, RMA, am acting as Location manager for [employer], DO.  I have reviewed the above documentation for accuracy and completeness, and I agree with the above. Nicole Lesch, DO

## 2021-07-09 ENCOUNTER — Ambulatory Visit (INDEPENDENT_AMBULATORY_CARE_PROVIDER_SITE_OTHER): Payer: BC Managed Care – PPO | Admitting: Bariatrics

## 2021-07-09 ENCOUNTER — Encounter (INDEPENDENT_AMBULATORY_CARE_PROVIDER_SITE_OTHER): Payer: Self-pay | Admitting: Bariatrics

## 2021-07-09 VITALS — BP 123/75 | HR 73 | Temp 98.1°F | Ht 61.0 in | Wt 151.0 lb

## 2021-07-09 DIAGNOSIS — E1165 Type 2 diabetes mellitus with hyperglycemia: Secondary | ICD-10-CM | POA: Diagnosis not present

## 2021-07-09 DIAGNOSIS — R7989 Other specified abnormal findings of blood chemistry: Secondary | ICD-10-CM | POA: Diagnosis not present

## 2021-07-09 DIAGNOSIS — Z6828 Body mass index (BMI) 28.0-28.9, adult: Secondary | ICD-10-CM

## 2021-07-09 DIAGNOSIS — E669 Obesity, unspecified: Secondary | ICD-10-CM | POA: Diagnosis not present

## 2021-07-09 DIAGNOSIS — E559 Vitamin D deficiency, unspecified: Secondary | ICD-10-CM | POA: Diagnosis not present

## 2021-07-09 DIAGNOSIS — Z7985 Long-term (current) use of injectable non-insulin antidiabetic drugs: Secondary | ICD-10-CM

## 2021-07-10 LAB — COMPREHENSIVE METABOLIC PANEL
ALT: 29 IU/L (ref 0–32)
AST: 29 IU/L (ref 0–40)
Albumin/Globulin Ratio: 1.8 (ref 1.2–2.2)
Albumin: 4.6 g/dL (ref 3.8–4.8)
Alkaline Phosphatase: 104 IU/L (ref 44–121)
BUN/Creatinine Ratio: 22 (ref 12–28)
BUN: 20 mg/dL (ref 8–27)
Bilirubin Total: 0.5 mg/dL (ref 0.0–1.2)
CO2: 23 mmol/L (ref 20–29)
Calcium: 10 mg/dL (ref 8.7–10.3)
Chloride: 104 mmol/L (ref 96–106)
Creatinine, Ser: 0.92 mg/dL (ref 0.57–1.00)
Globulin, Total: 2.5 g/dL (ref 1.5–4.5)
Glucose: 131 mg/dL — ABNORMAL HIGH (ref 70–99)
Potassium: 5.3 mmol/L — ABNORMAL HIGH (ref 3.5–5.2)
Sodium: 143 mmol/L (ref 134–144)
Total Protein: 7.1 g/dL (ref 6.0–8.5)
eGFR: 70 mL/min/{1.73_m2} (ref >59)

## 2021-07-10 LAB — INSULIN, RANDOM: INSULIN: 40.1 u[IU]/mL — ABNORMAL HIGH (ref 2.6–24.9)

## 2021-07-10 LAB — HEMOGLOBIN A1C
Est. average glucose Bld gHb Est-mCnc: 137 mg/dL
Hgb A1c MFr Bld: 6.4 % — ABNORMAL HIGH (ref 4.8–5.6)

## 2021-07-10 LAB — VITAMIN D 25 HYDROXY (VIT D DEFICIENCY, FRACTURES): Vit D, 25-Hydroxy: 71.4 ng/mL (ref 30.0–100.0)

## 2021-07-10 NOTE — Progress Notes (Unsigned)
Chief Complaint:   OBESITY Nicole Richard is here to discuss her progress with her obesity treatment plan along with follow-up of her obesity related diagnoses. Nicole Richard is on the Category 2 Plan and states she is following her eating plan approximately 98% of the time. Nicole Richard states she is walking for 30 minutes 2-3 times per week.  Today's visit was #: 47 Starting weight: 163 lbs Starting date: 05/13/2020 Today's weight: 151 lbs Today's date: 07/09/2021 Total lbs lost to date: 12 Total lbs lost since last in-office visit: 0  Interim History: Nicole Richard's weight remains the same.  Subjective:   1. Type 2 diabetes mellitus with hyperglycemia, without long-term current use of insulin (HCC) Nicole Richard is taking Mounjaro and Parlodel.   2. Vitamin D deficiency Nicole Richard is taking Vitamin D OTC 2,000 IU daily.  3. Abnormal liver function tests Nicole Richard denies abdominal pain.   Assessment/Plan:   1. Type 2 diabetes mellitus with hyperglycemia, without long-term current use of insulin (HCC) We will check labs today, and we will follow up at [facility] next visit.  - Insulin, random - Hemoglobin A1c  2. Vitamin D deficiency We will check labs today, and we will follow up at [facility] next visit.  - VITAMIN D 25 Hydroxy (Vit-D Deficiency, Fractures)  3. Abnormal liver function tests We will check labs today, and we will follow up at [facility] next visit.  - Comprehensive metabolic panel  4. Obesity, Current BMI 28.5 Nicole Richard is currently in the action stage of change. As such, her goal is to continue with weight loss efforts. She has agreed to the Category 2 Plan.   Low carbohydrate snacks and mindful eating were discussed.  Exercise goals: As is.  Behavioral modification strategies: increasing lean protein intake, decreasing simple carbohydrates, increasing vegetables, increasing water intake, decreasing eating out, no skipping meals, meal planning and cooking strategies, keeping healthy foods  in the home, and planning for success.  Nicole Richard has agreed to follow-up with our clinic in 3 to 4 weeks. She was informed of the importance of frequent follow-up visits to maximize her success with intensive lifestyle modifications for her multiple health conditions.   Nicole Richard was informed we would discuss her lab results at her next visit unless there is a critical issue that needs to be addressed sooner. Nicole Richard agreed to keep her next visit at the agreed upon time to discuss these results.  Objective:   Blood pressure 123/75, pulse 73, temperature 98.1 F (36.7 C), height '5\' 1"'$  (1.549 m), weight 151 lb (68.5 kg), SpO2 91 %. Body mass index is 28.53 kg/m.  General: Cooperative, alert, well developed, in no acute distress. HEENT: Conjunctivae and lids unremarkable. Cardiovascular: Regular rhythm.  Lungs: Normal work of breathing. Neurologic: No focal deficits.   Lab Results  Component Value Date   CREATININE 0.92 07/09/2021   BUN 20 07/09/2021   NA 143 07/09/2021   K 5.3 (H) 07/09/2021   CL 104 07/09/2021   CO2 23 07/09/2021   Lab Results  Component Value Date   ALT 29 07/09/2021   AST 29 07/09/2021   ALKPHOS 104 07/09/2021   BILITOT 0.5 07/09/2021   Lab Results  Component Value Date   HGBA1C 6.4 (H) 07/09/2021   HGBA1C 6.7 (H) 04/08/2021   HGBA1C 6.6 (A) 03/18/2021   HGBA1C 6.5 (A) 12/18/2020   HGBA1C 7.0 (H) 10/26/2020   Lab Results  Component Value Date   INSULIN 40.1 (H) 07/09/2021   INSULIN 41.3 (H) 04/08/2021   INSULIN  46.9 (H) 05/16/2020   Lab Results  Component Value Date   TSH 1.05 10/26/2020   Lab Results  Component Value Date   CHOL 150 04/08/2021   HDL 53 04/08/2021   LDLCALC 79 04/08/2021   LDLDIRECT 128.1 10/05/2013   TRIG 97 04/08/2021   CHOLHDL 3 10/26/2020   Lab Results  Component Value Date   VD25OH 71.4 07/09/2021   VD25OH 84.9 04/08/2021   VD25OH 63.69 10/26/2020   Lab Results  Component Value Date   WBC 6.6 10/26/2020   HGB  14.5 10/26/2020   HCT 43.7 10/26/2020   MCV 88.6 10/26/2020   PLT 337.0 10/26/2020   No results found for: "IRON", "TIBC", "FERRITIN"  Attestation Statements:   Reviewed by clinician on day of visit: allergies, medications, problem list, medical history, surgical history, family history, social history, and previous encounter notes.   Wilhemena Durie, am acting as Location manager for [employer], DO.  I have reviewed the above documentation for accuracy and completeness, and I agree with the above. -  ***

## 2021-07-13 ENCOUNTER — Encounter (INDEPENDENT_AMBULATORY_CARE_PROVIDER_SITE_OTHER): Payer: Self-pay | Admitting: Bariatrics

## 2021-07-23 ENCOUNTER — Other Ambulatory Visit (INDEPENDENT_AMBULATORY_CARE_PROVIDER_SITE_OTHER): Payer: Self-pay | Admitting: Bariatrics

## 2021-07-23 DIAGNOSIS — E1165 Type 2 diabetes mellitus with hyperglycemia: Secondary | ICD-10-CM

## 2021-07-24 ENCOUNTER — Other Ambulatory Visit (INDEPENDENT_AMBULATORY_CARE_PROVIDER_SITE_OTHER): Payer: Self-pay | Admitting: Bariatrics

## 2021-07-24 DIAGNOSIS — E1165 Type 2 diabetes mellitus with hyperglycemia: Secondary | ICD-10-CM

## 2021-08-02 ENCOUNTER — Other Ambulatory Visit (INDEPENDENT_AMBULATORY_CARE_PROVIDER_SITE_OTHER): Payer: Self-pay | Admitting: Bariatrics

## 2021-08-02 DIAGNOSIS — E1165 Type 2 diabetes mellitus with hyperglycemia: Secondary | ICD-10-CM

## 2021-08-05 MED ORDER — TIRZEPATIDE 12.5 MG/0.5ML ~~LOC~~ SOAJ: 12.5000 mg | SUBCUTANEOUS | 0 refills | Status: DC

## 2021-08-05 NOTE — Telephone Encounter (Signed)
LAST APPOINTMENT DATE: 07/09/21 NEXT APPOINTMENT DATE: 08/07/21   [id number] [place] [address]  [id number]: [phone]: [phone] Patient is requesting a refill of the following medications: Pending Prescriptions:                       Disp   Refills   tirzepatide (MOUNJARO) 12.5 MG/0.5ML Pen   2 mL   0       Sig: Inject 12.5 mg into the skin once a week.   Date last filled: 06/12/21 Previously prescribed by Dr. BOwens Shark Lab Results      Component                Value               Date                      HGBA1C                   6.4 (H)             07/09/2021                HGBA1C                   6.7 (H)             04/08/2021                HGBA1C                   6.6 (A)             03/18/2021           Lab Results      Component                Value               Date                      MICROALBUR               <0.7                10/26/2020                LDLCALC                  79                  04/08/2021                CREATININE               0.92                07/09/2021           Lab Results      Component                Value               Date                      VD25OH                   71.4  07/09/2021                VD25OH                   84.9                04/08/2021                VD25OH                   63.69               10/26/2020            BP Readings from Last 3 Encounters: 07/09/21 : 123/75 06/12/21 : 125/78 05/17/21 : 110/60

## 2021-08-07 ENCOUNTER — Ambulatory Visit (INDEPENDENT_AMBULATORY_CARE_PROVIDER_SITE_OTHER): Payer: BC Managed Care – PPO | Admitting: Bariatrics

## 2021-08-07 ENCOUNTER — Encounter (INDEPENDENT_AMBULATORY_CARE_PROVIDER_SITE_OTHER): Payer: Self-pay

## 2021-08-12 ENCOUNTER — Encounter (INDEPENDENT_AMBULATORY_CARE_PROVIDER_SITE_OTHER): Payer: Self-pay | Admitting: Bariatrics

## 2021-08-12 ENCOUNTER — Ambulatory Visit (INDEPENDENT_AMBULATORY_CARE_PROVIDER_SITE_OTHER): Payer: BC Managed Care – PPO | Admitting: Bariatrics

## 2021-08-12 ENCOUNTER — Other Ambulatory Visit (HOSPITAL_COMMUNITY): Payer: Self-pay

## 2021-08-12 VITALS — BP 120/73 | HR 61 | Temp 98.0°F | Ht 61.0 in | Wt 152.0 lb

## 2021-08-12 DIAGNOSIS — Z6828 Body mass index (BMI) 28.0-28.9, adult: Secondary | ICD-10-CM

## 2021-08-12 DIAGNOSIS — E669 Obesity, unspecified: Secondary | ICD-10-CM

## 2021-08-12 DIAGNOSIS — Z7985 Long-term (current) use of injectable non-insulin antidiabetic drugs: Secondary | ICD-10-CM

## 2021-08-12 DIAGNOSIS — I10 Essential (primary) hypertension: Secondary | ICD-10-CM

## 2021-08-12 DIAGNOSIS — E1165 Type 2 diabetes mellitus with hyperglycemia: Secondary | ICD-10-CM | POA: Diagnosis not present

## 2021-08-12 MED ORDER — TIRZEPATIDE 12.5 MG/0.5ML ~~LOC~~ SOAJ
12.5000 mg | SUBCUTANEOUS | 0 refills | Status: DC
  Filled 2021-08-12: qty 2, 28d supply, fill #0

## 2021-08-13 ENCOUNTER — Other Ambulatory Visit (HOSPITAL_COMMUNITY): Payer: Self-pay

## 2021-08-19 ENCOUNTER — Encounter (INDEPENDENT_AMBULATORY_CARE_PROVIDER_SITE_OTHER): Payer: Self-pay | Admitting: Bariatrics

## 2021-08-19 NOTE — Progress Notes (Signed)
Chief Complaint:   OBESITY Nicole Richard is here to discuss her progress with her obesity treatment plan along with follow-up of her obesity related diagnoses. Nicole Richard is on the Category 2 Plan and states she is following her eating plan approximately 75% of the time. Nicole Richard states she is walking for 30 minutes 2 times per week.  Today's visit was #: 59 Starting weight: 163 lbs Starting date: 05/13/2020 Today's weight: 152 lbs Today's date: 08/12/2021 Total lbs lost to date: 11 Total lbs lost since last in-office visit: 0  Interim History: Nicole Richard is up 1 pound and she has been on vacation.  Subjective:   1. Type 2 diabetes mellitus with hyperglycemia, without long-term current use of insulin (HCC) Nicole Richard's A1c is 6.4 and insulin 40.1.  Her last time taken was [facility] on 07/04/2021.  2. Essential hypertension Nicole Richard's blood pressure is controlled.   Assessment/Plan:   1. Type 2 diabetes mellitus with hyperglycemia, without long-term current use of insulin ([place]) Nicole Richard will continue Mounjaro 12.5 mg once weekly, and we will refill for 1 month.  - tirzepatide (MOUNJARO) 12.5 MG/0.5ML Pen; Inject 12.5 mg into the skin once a week.  Dispense: 2 mL; Refill: 0  2. Essential hypertension Nicole Richard will continue her medications as directed.   3. Obesity, Current BMI 28.9 Nicole Richard is currently in the action stage of change. As such, her goal is to continue with weight loss efforts. She has agreed to the Category 2 Plan.   Meal planning was discussed.  Review labs with the patient from 07/09/2021, CMP, vitamin D, A1c, and insulin.  She will get back on track.  Exercise goals: As is.   Behavioral modification strategies: increasing lean protein intake, decreasing simple carbohydrates, increasing vegetables, increasing water intake, decreasing eating out, no skipping meals, meal planning and cooking strategies, keeping healthy foods in the home, and planning for success.  Nicole Richard has agreed to  follow-up with our clinic in 4 weeks. She was informed of the importance of frequent follow-up visits to maximize her success with intensive lifestyle modifications for her multiple health conditions.   Objective:   Blood pressure 120/73, pulse 61, temperature 98 F (36.7 C), height '5\' 1"'$  (1.549 m), weight 152 lb (68.9 kg), SpO2 96 %. Body mass index is 28.72 kg/m.  General: Cooperative, alert, well developed, in no acute distress. HEENT: Conjunctivae and lids unremarkable. Cardiovascular: Regular rhythm.  Lungs: Normal work of breathing. Neurologic: No focal deficits.   Lab Results  Component Value Date   CREATININE 0.92 07/09/2021   BUN 20 07/09/2021   NA 143 07/09/2021   K 5.3 (H) 07/09/2021   CL 104 07/09/2021   CO2 23 07/09/2021   Lab Results  Component Value Date   ALT 29 07/09/2021   AST 29 07/09/2021   ALKPHOS 104 07/09/2021   BILITOT 0.5 07/09/2021   Lab Results  Component Value Date   HGBA1C 6.4 (H) 07/09/2021   HGBA1C 6.7 (H) 04/08/2021   HGBA1C 6.6 (A) 03/18/2021   HGBA1C 6.5 (A) 12/18/2020   HGBA1C 7.0 (H) 10/26/2020   Lab Results  Component Value Date   INSULIN 40.1 (H) 07/09/2021   INSULIN 41.3 (H) 04/08/2021   INSULIN 46.9 (H) 05/16/2020   Lab Results  Component Value Date   TSH 1.05 10/26/2020   Lab Results  Component Value Date   CHOL 150 04/08/2021   HDL 53 04/08/2021   LDLCALC 79 04/08/2021   LDLDIRECT 128.1 10/05/2013   TRIG 97 04/08/2021  CHOLHDL 3 10/26/2020   Lab Results  Component Value Date   VD25OH 71.4 07/09/2021   VD25OH 84.9 04/08/2021   VD25OH 63.69 10/26/2020   Lab Results  Component Value Date   WBC 6.6 10/26/2020   HGB 14.5 10/26/2020   HCT 43.7 10/26/2020   MCV 88.6 10/26/2020   PLT 337.0 10/26/2020   No results found for: "IRON", "TIBC", "FERRITIN"  Attestation Statements:   Reviewed by clinician on day of visit: allergies, medications, problem list, medical history, surgical history, family history,  social history, and previous encounter notes.   Wilhemena Durie, am acting as Location manager for [employer], DO.  I have reviewed the above documentation for accuracy and completeness, and I agree with the above. Jearld Lesch, DO

## 2021-08-28 ENCOUNTER — Encounter (INDEPENDENT_AMBULATORY_CARE_PROVIDER_SITE_OTHER): Payer: Self-pay

## 2021-09-17 ENCOUNTER — Encounter (INDEPENDENT_AMBULATORY_CARE_PROVIDER_SITE_OTHER): Payer: Self-pay | Admitting: Bariatrics

## 2021-09-17 ENCOUNTER — Ambulatory Visit (INDEPENDENT_AMBULATORY_CARE_PROVIDER_SITE_OTHER): Payer: BC Managed Care – PPO | Admitting: Bariatrics

## 2021-09-17 VITALS — BP 146/82 | HR 81 | Temp 98.0°F | Ht 61.0 in | Wt 150.0 lb

## 2021-09-17 DIAGNOSIS — E1169 Type 2 diabetes mellitus with other specified complication: Secondary | ICD-10-CM | POA: Diagnosis not present

## 2021-09-17 DIAGNOSIS — Z6828 Body mass index (BMI) 28.0-28.9, adult: Secondary | ICD-10-CM

## 2021-09-17 DIAGNOSIS — E669 Obesity, unspecified: Secondary | ICD-10-CM | POA: Diagnosis not present

## 2021-09-17 DIAGNOSIS — E1165 Type 2 diabetes mellitus with hyperglycemia: Secondary | ICD-10-CM

## 2021-09-17 DIAGNOSIS — Z7985 Long-term (current) use of injectable non-insulin antidiabetic drugs: Secondary | ICD-10-CM

## 2021-09-17 DIAGNOSIS — E785 Hyperlipidemia, unspecified: Secondary | ICD-10-CM

## 2021-09-17 MED ORDER — TIRZEPATIDE 12.5 MG/0.5ML ~~LOC~~ SOAJ: 12.5000 mg | SUBCUTANEOUS | 0 refills | Status: DC

## 2021-09-18 ENCOUNTER — Encounter (INDEPENDENT_AMBULATORY_CARE_PROVIDER_SITE_OTHER): Payer: Self-pay | Admitting: Bariatrics

## 2021-09-18 ENCOUNTER — Other Ambulatory Visit (INDEPENDENT_AMBULATORY_CARE_PROVIDER_SITE_OTHER): Payer: Self-pay | Admitting: Bariatrics

## 2021-09-18 DIAGNOSIS — E1165 Type 2 diabetes mellitus with hyperglycemia: Secondary | ICD-10-CM

## 2021-09-18 NOTE — Progress Notes (Signed)
Chief Complaint:   OBESITY Nicole Richard is here to discuss her progress with her obesity treatment plan along with follow-up of her obesity related diagnoses. Nicole Richard is on the Category 2 Plan and states she is following her eating plan approximately 75 % of the time. Nicole Richard states she is walking for 30 minutes 2 times per week.  Today's visit was #: 41 Starting weight: 163 lbs Starting date: 05/13/2020 Today's weight: 150 lbs Today's date: 09/17/2021 Total lbs lost to date: 13 lbs Total lbs lost since last in-office visit: 2 lbs  Interim History: Nicole Richard is down an additional 2 lbs. She has been working more and doing less meal planning. She not getting enough water intake.  Subjective:   1. Type 2 diabetes mellitus with hyperglycemia, without long-term current use of insulin (HCC) Arkie's fasting blood sugars range between 80-100.  2. Hyperlipidemia associated with type 2 diabetes mellitus ([place]) Dariela is taking Lipitor.  Assessment/Plan:   1. Type 2 diabetes mellitus with hyperglycemia, without long-term current use of insulin (HCC) We will refill Mounjaro 12.5 mg once weekly for 1 month.  - tirzepatide (MOUNJARO) 12.5 MG/0.5ML Pen; Inject 12.5 mg into the skin once a week.  Dispense: 2 mL; Refill: 0  2. Hyperlipidemia associated with type 2 diabetes mellitus ([place]) Oluwatobi will continue Lipitor as directed.  3. Obesity, Current BMI 28.4 Nicole Richard is currently in the action stage of change. As such, her goal is to continue with weight loss efforts. She has agreed to the Category 2 Plan.   Exercise goals: As is  Behavioral modification strategies: increasing lean protein intake, decreasing simple carbohydrates, increasing vegetables, increasing water intake, decreasing eating out, no skipping meals, meal planning and cooking strategies, keeping healthy foods in the home, and planning for success.  Nicole Richard has agreed to follow-up with our clinic in 4 weeks. She was informed of the  importance of frequent follow-up visits to maximize her success with intensive lifestyle modifications for her multiple health conditions.    Objective:   Blood pressure (!) 146/82, pulse 81, temperature 98 F (36.7 C), height '5\' 1"'$  (1.549 m), weight 150 lb (68 kg), SpO2 95 %. Body mass index is 28.34 kg/m.  General: Cooperative, alert, well developed, in no acute distress. HEENT: Conjunctivae and lids unremarkable. Cardiovascular: Regular rhythm.  Lungs: Normal work of breathing. Neurologic: No focal deficits.   Lab Results  Component Value Date   CREATININE 0.92 07/09/2021   BUN 20 07/09/2021   NA 143 07/09/2021   K 5.3 (H) 07/09/2021   CL 104 07/09/2021   CO2 23 07/09/2021   Lab Results  Component Value Date   ALT 29 07/09/2021   AST 29 07/09/2021   ALKPHOS 104 07/09/2021   BILITOT 0.5 07/09/2021   Lab Results  Component Value Date   HGBA1C 6.4 (H) 07/09/2021   HGBA1C 6.7 (H) 04/08/2021   HGBA1C 6.6 (A) 03/18/2021   HGBA1C 6.5 (A) 12/18/2020   HGBA1C 7.0 (H) 10/26/2020   Lab Results  Component Value Date   INSULIN 40.1 (H) 07/09/2021   INSULIN 41.3 (H) 04/08/2021   INSULIN 46.9 (H) 05/16/2020   Lab Results  Component Value Date   TSH 1.05 10/26/2020   Lab Results  Component Value Date   CHOL 150 04/08/2021   HDL 53 04/08/2021   LDLCALC 79 04/08/2021   LDLDIRECT 128.1 10/05/2013   TRIG 97 04/08/2021   CHOLHDL 3 10/26/2020   Lab Results  Component Value Date   VD25OH 71.4  07/09/2021   VD25OH 84.9 04/08/2021   VD25OH 63.69 10/26/2020   Lab Results  Component Value Date   WBC 6.6 10/26/2020   HGB 14.5 10/26/2020   HCT 43.7 10/26/2020   MCV 88.6 10/26/2020   PLT 337.0 10/26/2020   No results found for: "IRON", "TIBC", "FERRITIN"   Attestation Statements:   Reviewed by clinician on day of visit: allergies, medications, problem list, medical history, surgical history, family history, social history, and previous encounter notes.   Graylon Good, am acting as Location manager for [employer], DO.  I have reviewed the above documentation for accuracy and completeness, and I agree with the above. Jearld Lesch, DO

## 2021-09-19 ENCOUNTER — Telehealth (INDEPENDENT_AMBULATORY_CARE_PROVIDER_SITE_OTHER): Payer: Self-pay

## 2021-09-19 NOTE — Telephone Encounter (Signed)
Left message to return call and will send a MyChart message

## 2021-09-19 NOTE — Telephone Encounter (Signed)
Patient states she was seen on 09/17/21 by Dr. Owens Shark. Patient states her pharmacy never received her refill for her tirzepatide [facility]) 12.5 MG/0.5ML Pen  Patient would like to see if we could [facility]  [id number] [place] [place]:  [phone] Fax:  [phone]   .

## 2021-09-20 ENCOUNTER — Ambulatory Visit
Admission: RE | Admit: 2021-09-20 | Discharge: 2021-09-20 | Disposition: A | Discharge status: Home / Self Care | Payer: BC Managed Care – PPO | Source: Ambulatory Visit | Attending: Family Medicine | Admitting: Family Medicine

## 2021-09-20 ENCOUNTER — Other Ambulatory Visit: Payer: Self-pay | Admitting: Family Medicine

## 2021-09-20 DIAGNOSIS — Z1231 Encounter for screening mammogram for malignant neoplasm of breast: Secondary | ICD-10-CM | POA: Diagnosis not present

## 2021-09-24 ENCOUNTER — Encounter (INDEPENDENT_AMBULATORY_CARE_PROVIDER_SITE_OTHER): Payer: Self-pay

## 2021-09-30 ENCOUNTER — Ambulatory Visit: Payer: BC Managed Care – PPO | Admitting: Dermatology

## 2021-10-10 ENCOUNTER — Ambulatory Visit: Payer: BC Managed Care – PPO | Admitting: Bariatrics

## 2021-10-10 ENCOUNTER — Encounter: Payer: Self-pay | Admitting: Bariatrics

## 2021-10-10 VITALS — BP 132/75 | HR 81 | Temp 97.8°F | Ht 61.0 in | Wt 149.0 lb

## 2021-10-10 DIAGNOSIS — E1159 Type 2 diabetes mellitus with other circulatory complications: Secondary | ICD-10-CM

## 2021-10-10 DIAGNOSIS — Z6828 Body mass index (BMI) 28.0-28.9, adult: Secondary | ICD-10-CM

## 2021-10-10 DIAGNOSIS — E669 Obesity, unspecified: Secondary | ICD-10-CM | POA: Diagnosis not present

## 2021-10-10 DIAGNOSIS — Z7985 Long-term (current) use of injectable non-insulin antidiabetic drugs: Secondary | ICD-10-CM

## 2021-10-10 DIAGNOSIS — I152 Hypertension secondary to endocrine disorders: Secondary | ICD-10-CM

## 2021-10-10 DIAGNOSIS — E1165 Type 2 diabetes mellitus with hyperglycemia: Secondary | ICD-10-CM | POA: Diagnosis not present

## 2021-10-10 MED ORDER — TIRZEPATIDE 15 MG/0.5ML ~~LOC~~ SOAJ: 15.0000 mg | SUBCUTANEOUS | 0 refills | Status: DC

## 2021-10-14 ENCOUNTER — Encounter: Payer: Self-pay | Admitting: Bariatrics

## 2021-10-14 NOTE — Progress Notes (Signed)
Chief Complaint:   OBESITY Nicole Richard is here to discuss her progress with her obesity treatment plan along with follow-up of her obesity related diagnoses. Nicole Richard is on the Category 2 Plan and states she is following her eating plan approximately 100% of the time. Nicole Richard states she is walking for 30 minutes 2 times per week.  Today's visit was #: 20 Starting weight: 163 lbs Starting date: 05/13/2020 Today's weight: 149 lbs Today's date: 10/10/21 Total lbs lost to date: 14 Total lbs lost since last in-office visit: -1  Interim History: She is down 1 pound.  She is not getting enough water.  Subjective:   1. Type 2 diabetes mellitus with hyperglycemia, without long-term current use of insulin ([place]) Taking Mounjaro as directed.    2. Hypertension associated with type 2 diabetes mellitus ([place]) Controlled.  Assessment/Plan:   1. Type 2 diabetes mellitus with hyperglycemia, without long-term current use of insulin ([place]) Will go back to 1/4 tablet of the bromocriptine. Increase dose of Mounjaro from 12.5 mg to 15 mg. - tirzepatide (MOUNJARO) 15 MG/0.5ML Pen; Inject 15 mg into the skin once a week.  Dispense: 2 mL; Refill: 0  2. Hypertension associated with type 2 diabetes mellitus ([facility]) Continue medications.  3. Obesity, Current BMI 28.3 1.  Meal planning 2.  Intentional eating 3.  Increase fiber (unable to take Metamucil or Citrucel) 4.  Increase water  Nicole Richard is currently in the action stage of change. As such, her goal is to continue with weight loss efforts. She has agreed to the Category 2 Plan.   Exercise goals: Walking and will continue.  Behavioral modification strategies: increasing lean protein intake, decreasing simple carbohydrates, increasing vegetables, increasing water intake, decreasing eating out, no skipping meals, meal planning and cooking strategies, keeping healthy foods in the home, and planning for success.  Nicole Richard has agreed to follow-up with our  clinic in 4 weeks. She was informed of the importance of frequent follow-up visits to maximize her success with intensive lifestyle modifications for her multiple health conditions.    Objective:   Blood pressure 132/75, pulse 81, temperature 97.8 F (36.6 C), height '5\' 1"'$  (1.549 m), weight 149 lb (67.6 kg), SpO2 97 %. Body mass index is 28.15 kg/m.  General: Cooperative, alert, well developed, in no acute distress. HEENT: Conjunctivae and lids unremarkable. Cardiovascular: Regular rhythm.  Lungs: Normal work of breathing. Neurologic: No focal deficits.   Lab Results  Component Value Date   CREATININE 0.92 07/09/2021   BUN 20 07/09/2021   NA 143 07/09/2021   K 5.3 (H) 07/09/2021   CL 104 07/09/2021   CO2 23 07/09/2021   Lab Results  Component Value Date   ALT 29 07/09/2021   AST 29 07/09/2021   ALKPHOS 104 07/09/2021   BILITOT 0.5 07/09/2021   Lab Results  Component Value Date   HGBA1C 6.4 (H) 07/09/2021   HGBA1C 6.7 (H) 04/08/2021   HGBA1C 6.6 (A) 03/18/2021   HGBA1C 6.5 (A) 12/18/2020   HGBA1C 7.0 (H) 10/26/2020   Lab Results  Component Value Date   INSULIN 40.1 (H) 07/09/2021   INSULIN 41.3 (H) 04/08/2021   INSULIN 46.9 (H) 05/16/2020   Lab Results  Component Value Date   TSH 1.05 10/26/2020   Lab Results  Component Value Date   CHOL 150 04/08/2021   HDL 53 04/08/2021   LDLCALC 79 04/08/2021   LDLDIRECT 128.1 10/05/2013   TRIG 97 04/08/2021   CHOLHDL 3 10/26/2020   Lab  Results  Component Value Date   VD25OH 71.4 07/09/2021   VD25OH 84.9 04/08/2021   VD25OH 63.69 10/26/2020   Lab Results  Component Value Date   WBC 6.6 10/26/2020   HGB 14.5 10/26/2020   HCT 43.7 10/26/2020   MCV 88.6 10/26/2020   PLT 337.0 10/26/2020   No results found for: "IRON", "TIBC", "FERRITIN"   Attestation Statements:   Reviewed by clinician on day of visit: allergies, medications, problem list, medical history, surgical history, family history, social history,  and previous encounter notes.  I, Dawn Whitmire, FNP-C, am acting as transcriptionist for Dr. Jearld Lesch.  I have reviewed the above documentation for accuracy and completeness, and I agree with the above. Jearld Lesch, DO

## 2021-10-17 ENCOUNTER — Other Ambulatory Visit (INDEPENDENT_AMBULATORY_CARE_PROVIDER_SITE_OTHER): Payer: Self-pay | Admitting: Bariatrics

## 2021-10-17 DIAGNOSIS — E1165 Type 2 diabetes mellitus with hyperglycemia: Secondary | ICD-10-CM

## 2021-10-21 ENCOUNTER — Encounter: Payer: BC Managed Care – PPO | Admitting: Family Medicine

## 2021-11-04 ENCOUNTER — Encounter: Payer: Self-pay | Admitting: Family Medicine

## 2021-11-04 ENCOUNTER — Ambulatory Visit (INDEPENDENT_AMBULATORY_CARE_PROVIDER_SITE_OTHER): Payer: BC Managed Care – PPO | Admitting: Family Medicine

## 2021-11-04 VITALS — BP 120/80 | HR 89 | Temp 98.8°F | Resp 18 | Ht 61.0 in | Wt 151.0 lb

## 2021-11-04 DIAGNOSIS — Z6831 Body mass index (BMI) 31.0-31.9, adult: Secondary | ICD-10-CM | POA: Diagnosis not present

## 2021-11-04 DIAGNOSIS — Z23 Encounter for immunization: Secondary | ICD-10-CM

## 2021-11-04 DIAGNOSIS — E1165 Type 2 diabetes mellitus with hyperglycemia: Secondary | ICD-10-CM | POA: Diagnosis not present

## 2021-11-04 DIAGNOSIS — E1151 Type 2 diabetes mellitus with diabetic peripheral angiopathy without gangrene: Secondary | ICD-10-CM | POA: Insufficient documentation

## 2021-11-04 DIAGNOSIS — I152 Hypertension secondary to endocrine disorders: Secondary | ICD-10-CM

## 2021-11-04 DIAGNOSIS — E1169 Type 2 diabetes mellitus with other specified complication: Secondary | ICD-10-CM | POA: Diagnosis not present

## 2021-11-04 DIAGNOSIS — E1159 Type 2 diabetes mellitus with other circulatory complications: Secondary | ICD-10-CM | POA: Diagnosis not present

## 2021-11-04 DIAGNOSIS — E785 Hyperlipidemia, unspecified: Secondary | ICD-10-CM | POA: Diagnosis not present

## 2021-11-04 DIAGNOSIS — I10 Essential (primary) hypertension: Secondary | ICD-10-CM

## 2021-11-04 DIAGNOSIS — I209 Angina pectoris, unspecified: Secondary | ICD-10-CM

## 2021-11-04 DIAGNOSIS — E559 Vitamin D deficiency, unspecified: Secondary | ICD-10-CM | POA: Diagnosis not present

## 2021-11-04 DIAGNOSIS — E669 Obesity, unspecified: Secondary | ICD-10-CM

## 2021-11-04 LAB — COMPREHENSIVE METABOLIC PANEL
ALT: 31 U/L (ref 0–35)
AST: 25 U/L (ref 0–37)
Albumin: 4.6 g/dL (ref 3.5–5.2)
Alkaline Phosphatase: 118 U/L — ABNORMAL HIGH (ref 39–117)
BUN: 15 mg/dL (ref 6–23)
CO2: 26 mEq/L (ref 19–32)
Calcium: 10.1 mg/dL (ref 8.4–10.5)
Chloride: 105 mEq/L (ref 96–112)
Creatinine, Ser: 0.68 mg/dL (ref 0.40–1.20)
GFR: 92.11 mL/min (ref >60.00)
Glucose, Bld: 95 mg/dL (ref 70–99)
Potassium: 4.7 mEq/L (ref 3.5–5.1)
Sodium: 141 mEq/L (ref 135–145)
Total Bilirubin: 0.8 mg/dL (ref 0.2–1.2)
Total Protein: 7.4 g/dL (ref 6.0–8.3)

## 2021-11-04 LAB — CBC WITH DIFFERENTIAL/PLATELET
Basophils Absolute: 0.1 10*3/uL (ref 0.0–0.1)
Basophils Relative: 0.8 % (ref 0.0–3.0)
Eosinophils Absolute: 0.2 10*3/uL (ref 0.0–0.7)
Eosinophils Relative: 3.6 % (ref 0.0–5.0)
HCT: 43 % (ref 36.0–46.0)
Hemoglobin: 14.5 g/dL (ref 12.0–15.0)
Lymphocytes Relative: 32.8 % (ref 12.0–46.0)
Lymphs Abs: 2.2 10*3/uL (ref 0.7–4.0)
MCHC: 33.7 g/dL (ref 30.0–36.0)
MCV: 88.1 fl (ref 78.0–100.0)
Monocytes Absolute: 0.4 10*3/uL (ref 0.1–1.0)
Monocytes Relative: 6.5 % (ref 3.0–12.0)
Neutro Abs: 3.7 10*3/uL (ref 1.4–7.7)
Neutrophils Relative %: 56.3 % (ref 43.0–77.0)
Platelets: 297 10*3/uL (ref 150.0–400.0)
RBC: 4.88 Mil/uL (ref 3.87–5.11)
RDW: 12.9 % (ref 11.5–15.5)
WBC: 6.6 10*3/uL (ref 4.0–10.5)

## 2021-11-04 LAB — LIPID PANEL
Cholesterol: 196 mg/dL (ref 0–200)
HDL: 60.5 mg/dL (ref >39.00)
LDL Cholesterol: 109 mg/dL — ABNORMAL HIGH (ref 0–99)
NonHDL: 135.46
Total CHOL/HDL Ratio: 3
Triglycerides: 130 mg/dL (ref 0.0–149.0)
VLDL: 26 mg/dL (ref 0.0–40.0)

## 2021-11-04 LAB — HEMOGLOBIN A1C: Hgb A1c MFr Bld: 6.4 % (ref 4.6–6.5)

## 2021-11-04 LAB — VITAMIN D 25 HYDROXY (VIT D DEFICIENCY, FRACTURES): VITD: 38.67 ng/mL (ref 30.00–100.00)

## 2021-11-04 LAB — MICROALBUMIN / CREATININE URINE RATIO
Creatinine,U: 179.9 mg/dL
Microalb Creat Ratio: 0.9 mg/g (ref 0.0–30.0)
Microalb, Ur: 1.6 mg/dL (ref 0.0–1.9)

## 2021-11-04 LAB — TSH: TSH: 0.59 u[IU]/mL (ref 0.35–5.50)

## 2021-11-04 LAB — VITAMIN B12: Vitamin B-12: 500 pg/mL (ref 211–911)

## 2021-11-04 MED ORDER — TIRZEPATIDE 15 MG/0.5ML ~~LOC~~ SOAJ: 15.0000 mg | SUBCUTANEOUS | 0 refills | Status: DC

## 2021-11-04 NOTE — Patient Instructions (Signed)
Preventive Care 40-64 Years Old, Female Preventive care refers to lifestyle choices and visits with your health care provider that can promote health and wellness. Preventive care visits are also called wellness exams. What can I expect for my preventive care visit? Counseling Your health care provider may ask you questions about your: Medical history, including: Past medical problems. Family medical history. Pregnancy history. Current health, including: Menstrual cycle. Method of birth control. Emotional well-being. Home life and relationship well-being. Sexual activity and sexual health. Lifestyle, including: Alcohol, nicotine or tobacco, and drug use. Access to firearms. Diet, exercise, and sleep habits. Work and work environment. Sunscreen use. Safety issues such as seatbelt and bike helmet use. Physical exam Your health care provider will check your: Height and weight. These may be used to calculate your BMI (body mass index). BMI is a measurement that tells if you are at a healthy weight. Waist circumference. This measures the distance around your waistline. This measurement also tells if you are at a healthy weight and may help predict your risk of certain diseases, such as type 2 diabetes and high blood pressure. Heart rate and blood pressure. Body temperature. Skin for abnormal spots. What immunizations do I need?  Vaccines are usually given at various ages, according to a schedule. Your health care provider will recommend vaccines for you based on your age, medical history, and lifestyle or other factors, such as travel or where you work. What tests do I need? Screening Your health care provider may recommend screening tests for certain conditions. This may include: Lipid and cholesterol levels. Diabetes screening. This is done by checking your blood sugar (glucose) after you have not eaten for a while (fasting). Pelvic exam and Pap test. Hepatitis B test. Hepatitis C  test. HIV (human immunodeficiency virus) test. STI (sexually transmitted infection) testing, if you are at risk. Lung cancer screening. Colorectal cancer screening. Mammogram. Talk with your health care provider about when you should start having regular mammograms. This may depend on whether you have a family history of breast cancer. BRCA-related cancer screening. This may be done if you have a family history of breast, ovarian, tubal, or peritoneal cancers. Bone density scan. This is done to screen for osteoporosis. Talk with your health care provider about your test results, treatment options, and if necessary, the need for more tests. Follow these instructions at home: Eating and drinking  Eat a diet that includes fresh fruits and vegetables, whole grains, lean protein, and low-fat dairy products. Take vitamin and mineral supplements as recommended by your health care provider. Do not drink alcohol if: Your health care provider tells you not to drink. You are pregnant, may be pregnant, or are planning to become pregnant. If you drink alcohol: Limit how much you have to 0-1 drink a day. Know how much alcohol is in your drink. In the [place] one drink equals one 12 oz bottle of beer (355 mL), one 5 oz glass of wine (148 mL), or one 1 oz glass of hard liquor (44 mL). Lifestyle Brush your teeth every morning and night with fluoride toothpaste. Floss one time each day. Exercise for at least 30 minutes 5 or more days each week. Do not use any products that contain nicotine or tobacco. These products include cigarettes, chewing tobacco, and vaping devices, such as e-cigarettes. If you need help quitting, ask your health care provider. Do not use drugs. If you are sexually active, practice safe sex. Use a condom or other form of protection to   prevent STIs. If you do not wish to become pregnant, use a form of birth control. If you plan to become pregnant, see your health care provider for a  prepregnancy visit. Take aspirin only as told by your health care provider. Make sure that you understand how much to take and what form to take. Work with your health care provider to find out whether it is safe and beneficial for you to take aspirin daily. Find healthy ways to manage stress, such as: Meditation, yoga, or listening to music. Journaling. Talking to a trusted person. Spending time with friends and family. Minimize exposure to UV radiation to reduce your risk of skin cancer. Safety Always wear your seat belt while driving or riding in a vehicle. Do not drive: If you have been drinking alcohol. Do not ride with someone who has been drinking. When you are tired or distracted. While texting. If you have been using any mind-altering substances or drugs. Wear a helmet and other protective equipment during sports activities. If you have firearms in your house, make sure you follow all gun safety procedures. Seek help if you have been physically or sexually abused. What's next? Visit your health care provider once a year for an annual wellness visit. Ask your health care provider how often you should have your eyes and teeth checked. Stay up to date on all vaccines. This information is not intended to replace advice given to you by your health care provider. Make sure you discuss any questions you have with your health care provider. Document Revised: 07/04/2020 Document Reviewed: 07/04/2020 Else[employer] Patient Education  [place].

## 2021-11-04 NOTE — Progress Notes (Signed)
Subjective:   By signing my name below, I, Nicole Richard, attest that this documentation has been prepared under the direction and in the presence of Nicole Held DO 11/04/2021   Patient ID: Nicole Richard, female    DOB: 06/14/1955, 66 y.o.   MRN: [id number]  Chief Complaint  Patient presents with   Annual Exam    Pt states fasting     HPI Patient is in today for a comprehensive physical exam   She reports that she discontinued all of her medication except for her 15 Mg/0.5 mL of Mounjaro and 2.5 Mg of Parlodel. She states that since discontinuing her medication, her headaches are improving. She also reports that she has lost about 15 lbs over the years.  Wt Readings from Last 3 Encounters:  11/04/21 151 lb (68.5 kg)  10/10/21 149 lb (67.6 kg)  09/17/21 150 lb (68 kg)   She reports that her depression is controlled   As of today's visit, her blood pressure is normal. When she was on her blood pressure medications she reports an average blood pressure of 110/70.  BP Readings from Last 3 Encounters:  11/04/21 120/80  10/10/21 132/75  09/17/21 (!) 146/82   Pulse Readings from Last 3 Encounters:  11/04/21 89  10/10/21 81  09/17/21 81   She reports that her blood sugars range from 80 - 110 mg/dL. She is continuing to go to [facility].  Lab Results  Component Value Date   HGBA1C 6.4 (H) 07/09/2021   She denies having any fever, new muscle pain, joint pain , new moles, congestion, sinus pain, sore throat, chest pain, palpations, cough, SOB ,wheezing,n/v/d constipation, blood in stool, dysuria, frequency, hematuria, at this time  She denies of any changes to her family history. She reports no recent surgeries Colonoscopy last completed on 09/10/2018 Dexa last completed on 09/26/2010 Pap Smear last completed on 10/09/16. She does not have a regular gynecologist.  Mammogram last completed on 09/14/2015 She is requesting an influenza vaccine during  today's visit.  She regularly walks She is UTD on dental exams She is UTD on vision exams   Past Medical History:  Diagnosis Date   Abdominal pain, left upper quadrant 03/01/2013   Abnormal liver function tests 03/01/2013   Allergy    seasonal   Angina at rest 03/21/2020   Angina pectoris ([place]) 03/21/2020   Asthma    Back pain    BREAST IMPLANTS, BILATERAL, HX OF 07/28/2007   Qualifier: Diagnosis of  By: [name]     CYSTOCELE WITH INCOMPLETE UTERINE PROLAPSE 05/18/2006   Qualifier: Diagnosis of  By: [name]     Diabetes ([place]) 01/29/2014   Diabetes mellitus without complication ([place])    Edema of both lower extremities    ESOPHAGEAL STRICTURE 11/09/2009   Qualifier: Diagnosis of  By: [name] ([place]), [name]     Essential hypertension 05/18/2006   Qualifier: Diagnosis of  By: [name]     Extrinsic asthma with exacerbation, unspecified asthma severity 10/26/2020   GERD 09/14/2009   Qualifier: Diagnosis of  By: [name]     GERD (gastroesophageal reflux disease)    HEADACHE 05/18/2006   Qualifier: Diagnosis of  By: [name]     Hirsutism 05/18/2006   Qualifier: Diagnosis of  By: [name]     Hyperlipidemia    Hyperlipidemia associated with type 2 diabetes mellitus ([place]) 04/15/2019   Hypertension  Kidney stones    LAPAROSCOPY, HX OF 05/18/2006   Qualifier: Diagnosis of  By: [name]     Mixed dyslipidemia 06/22/2020   Nonspecific (abnormal) findings on radiological and other examination of biliary tract 03/01/2013   Obesity (BMI 30-39.9) 10/18/2012   Other chest pain 03/21/2020   Palpitation 03/21/2020   Palpitations    POSTMENOPAUSAL STATUS 09/14/2009   Qualifier: Diagnosis of  By: [name]     Preventative health care 10/26/2020   Seasonal allergic rhinitis 10/26/2020   SKIN TAG 10/05/2009   Qualifier: Diagnosis of  By: [name]     SOB (shortness of breath)    Uncontrolled type 2 diabetes mellitus with  hyperglycemia ([place]) 04/15/2019   Vitamin D deficiency    Vitamin D insufficiency 05/21/2020    Past Surgical History:  Procedure Laterality Date   ABDOMINAL HYSTERECTOMY  08/2004   APPENDECTOMY     AUGMENTATION MAMMAPLASTY Bilateral 2000   BREAST ENHANCEMENT SURGERY  2001   COLONOSCOPY     LEFT HEART CATH AND CORONARY ANGIOGRAPHY N/A 04/09/2020   Procedure: LEFT HEART CATH AND CORONARY ANGIOGRAPHY;  Surgeon: Leonie Man, MD;  Location: [facility];  Service: Cardiovascular;  Laterality: N/A;   POLYPECTOMY  2009   sigmoid polyps   SHOULDER ARTHROSCOPY W/ ROTATOR CUFF REPAIR Right 06/06/2015   [facility]   TONSILLECTOMY     TUBAL LIGATION      Family History  Problem Relation Age of Onset   Rheumatic fever Mother    Irritable bowel syndrome Mother    Breast cancer Mother    Diabetes Mother    High blood pressure Mother    High Cholesterol Mother    Heart disease Mother    Stroke Mother    Sudden death Mother    Obesity Mother    Cancer Father    Diabetes Father    Colon polyps Father    Kidney disease Father    High blood pressure Father    High Cholesterol Father    Heart disease Father    Obesity Father    Cervical cancer Maternal Grandmother    Breast cancer Maternal Grandmother    Colon cancer Maternal Grandmother    Uterine cancer Maternal Grandmother    Coronary artery disease Other    Hyperlipidemia Other    Hypertension Other    Asthma Other    Esophageal cancer Neg Hx    Rectal cancer Neg Hx    Stomach cancer Neg Hx     Social History   Socioeconomic History   Marital status: Divorced    Spouse name: Not on file   Number of children: 2   Years of education: Not on file   Highest education level: Not on file  Occupational History   Occupation: Nurse    Comment: [place], Nicole Richard  Tobacco Use   Smoking status: Never   Smokeless tobacco: Never  Vaping Use   Vaping Use: Never used  Substance and Sexual Activity   Alcohol use: Yes     Comment: rare   Drug use: No   Sexual activity: Not Currently    Partners: Male  Other Topics Concern   Not on file  Social History Narrative   Exercise--walking , and pt daily   Social Determinants of Health   Financial Resource Strain: Low Risk  (10/12/2017)   Overall Financial Resource Strain (CARDIA)    Difficulty of Paying Living Expenses: Not hard at all  Food Insecurity: Not on file  Transportation Needs: Not on file  Physical Activity: Insufficiently Active (10/21/2019)   Exercise Vital Sign    Days of Exercise per Week: 3 days    Minutes of Exercise per Session: 20 min  Stress: Not on file  Social Connections: Not on file  Intimate Partner Violence: Not on file    Outpatient Medications Prior to Visit  Medication Sig Dispense Refill   aspirin 81 MG chewable tablet Chew 81 mg by mouth at bedtime.     atenolol (TENORMIN) 25 MG tablet TAKE 1 TABLET BY MOUTH EVERY DAY 90 tablet 3   atorvastatin (LIPITOR) 40 MG tablet TAKE 1 TABLET BY MOUTH EVERYDAY AT BEDTIME 90 tablet 1   bromocriptine (PARLODEL) 2.5 MG tablet TAKE 0.5 TABLETS BY MOUTH DAILY. (Patient taking differently: Per patient is taking 1/4) 45 tablet 3   Calcium Carbonate-Vitamin D (OSCAL 500/200 D-3 PO) Take 1 tablet by mouth at bedtime.     Continuous Blood Gluc Sensor (FREESTYLE LIBRE 14 DAY SENSOR) MISC 1 each by Does not apply route every 14 (fourteen) days. 6 each 3   CRANBERRY-VITAMIN C PO Take 2 capsules by mouth at bedtime.     fenofibrate 160 MG tablet TAKE 1 TABLET BY MOUTH EVERY DAY 90 tablet 1   Flaxseed, Linseed, (FLAXSEED OIL PO) Take 1,400 mg by mouth at bedtime.     Ipratropium-Albuterol (COMBIVENT RESPIMAT) 20-100 MCG/ACT AERS respimat Inhale 1 puff into the lungs 4 (four) times daily as needed for wheezing or shortness of breath. 4 g 5   mometasone (NASONEX) 50 MCG/ACT nasal spray Place 2 sprays into the nose daily.     montelukast (SINGULAIR) 10 MG tablet Take 1 tablet (10 mg total) by mouth daily.  90 tablet 3   ondansetron (ZOFRAN) 8 MG tablet Take 1 tablet (8 mg total) by mouth every 8 (eight) hours as needed for nausea or vomiting. 20 tablet 0   pantoprazole (PROTONIX) 40 MG tablet Take 1 tablet (40 mg total) by mouth 2 (two) times daily. 180 tablet 3   Propylene Glycol 0.6 % SOLN Apply 1 drop to eye 4 (four) times daily as needed for dry eyes (dry/irritated eyes).     tirzepatide (MOUNJARO) 15 MG/0.5ML Pen Inject 15 mg into the skin once a week. 2 mL 0   nitroGLYCERIN (NITROSTAT) 0.4 MG SL tablet Place 1 tablet (0.4 mg total) under the tongue every 5 (five) minutes as needed for chest pain. 25 tablet 3   No facility-administered medications prior to visit.    Allergies  Allergen Reactions   Contrast Media [Iodinated Contrast Media] Anaphylaxis   Metamucil [Psyllium] Anaphylaxis   Mustard Seed Anaphylaxis    Review of Systems  Constitutional:  Negative for fever and malaise/fatigue.  HENT:  Negative for congestion, sinus pain and sore throat.   Eyes:  Negative for blurred vision.  Respiratory:  Negative for cough, shortness of breath and wheezing.   Cardiovascular:  Negative for chest pain, palpitations and leg swelling.  Gastrointestinal:  Negative for abdominal pain, blood in stool, constipation, diarrhea, nausea and vomiting.  Genitourinary:  Negative for dysuria, frequency and hematuria.  Musculoskeletal:  Negative for falls, joint pain and myalgias.  Skin:  Negative for rash.       (-) [place]  Neurological:  Negative for dizziness, loss of consciousness and headaches.  Endo/Heme/Allergies:  Negative for environmental allergies.  Psychiatric/Behavioral:  Negative for depression. The patient is not nervous/anxious.  Objective:    Physical Exam Vitals and nursing note reviewed.  Constitutional:      General: She is not in acute distress.    Appearance: Normal appearance. She is not ill-appearing.  HENT:     Head: Normocephalic and atraumatic.     Right  Ear: Tympanic membrane, ear canal and external ear normal.     Left Ear: Tympanic membrane, ear canal and external ear normal.  Eyes:     Extraocular Movements: Extraocular movements intact.     Pupils: Pupils are equal, round, and reactive to light.  Neck:     Thyroid: No thyromegaly.  Cardiovascular:     Rate and Rhythm: Normal rate and regular rhythm.     Heart sounds: Normal heart sounds. No murmur heard.    No gallop.  Pulmonary:     Effort: Pulmonary effort is normal. No respiratory distress.     Breath sounds: Normal breath sounds. No wheezing or rales.  Abdominal:     General: Bowel sounds are normal. There is no distension.     Palpations: Abdomen is soft.     Tenderness: There is no abdominal tenderness. There is no guarding.  Lymphadenopathy:     Cervical: No cervical adenopathy.  Skin:    General: Skin is warm and dry.  Neurological:     Mental Status: She is alert and oriented to person, place, and time.  Psychiatric:        Judgment: Judgment normal.     BP 120/80 (BP Location: Left Arm, Patient Position: Sitting, Cuff Size: Normal)   Pulse 89   Temp 98.8 F (37.1 C) (Oral)   Resp 18   Ht 5' 1"  (1.549 m)   Wt 151 lb (68.5 kg)   SpO2 94%   BMI 28.53 kg/m  Wt Readings from Last 3 Encounters:  11/04/21 151 lb (68.5 kg)  10/10/21 149 lb (67.6 kg)  09/17/21 150 lb (68 kg)    Diabetic Foot Exam - Simple   No data filed    Lab Results  Component Value Date   WBC 6.6 10/26/2020   HGB 14.5 10/26/2020   HCT 43.7 10/26/2020   PLT 337.0 10/26/2020   GLUCOSE 131 (H) 07/09/2021   CHOL 150 04/08/2021   TRIG 97 04/08/2021   HDL 53 04/08/2021   LDLDIRECT 128.1 10/05/2013   LDLCALC 79 04/08/2021   ALT 29 07/09/2021   AST 29 07/09/2021   NA 143 07/09/2021   K 5.3 (H) 07/09/2021   CL 104 07/09/2021   CREATININE 0.92 07/09/2021   BUN 20 07/09/2021   CO2 23 07/09/2021   TSH 1.05 10/26/2020   HGBA1C 6.4 (H) 07/09/2021   MICROALBUR <0.7 10/26/2020     Lab Results  Component Value Date   TSH 1.05 10/26/2020   Lab Results  Component Value Date   WBC 6.6 10/26/2020   HGB 14.5 10/26/2020   HCT 43.7 10/26/2020   MCV 88.6 10/26/2020   PLT 337.0 10/26/2020   Lab Results  Component Value Date   NA 143 07/09/2021   K 5.3 (H) 07/09/2021   CO2 23 07/09/2021   GLUCOSE 131 (H) 07/09/2021   BUN 20 07/09/2021   CREATININE 0.92 07/09/2021   BILITOT 0.5 07/09/2021   ALKPHOS 104 07/09/2021   AST 29 07/09/2021   ALT 29 07/09/2021   PROT 7.1 07/09/2021   ALBUMIN 4.6 07/09/2021   CALCIUM 10.0 07/09/2021   EGFR 70 07/09/2021   GFR 62.29 10/26/2020   Lab Results  Component Value Date   CHOL 150 04/08/2021   Lab Results  Component Value Date   HDL 53 04/08/2021   Lab Results  Component Value Date   LDLCALC 79 04/08/2021   Lab Results  Component Value Date   TRIG 97 04/08/2021   Lab Results  Component Value Date   CHOLHDL 3 10/26/2020   Lab Results  Component Value Date   HGBA1C 6.4 (H) 07/09/2021       Assessment & Plan:   Problem List Items Addressed This Visit       Unprioritized   Hyperlipidemia associated with type 2 diabetes mellitus ([place]) (Chronic)    Tolerating statin, encouraged heart healthy diet, avoid trans fats, minimize simple carbs and saturated fats. Increase exercise as tolerated      Relevant Medications   tirzepatide (MOUNJARO) 15 MG/0.5ML Pen   Other Relevant Orders   Comprehensive metabolic panel   Lipid panel   Essential hypertension (Chronic)    Well controlled, no changes to meds. Encouraged heart healthy diet such as the DASH diet and exercise as tolerated.       Relevant Orders   CBC with Differential/Platelet   Comprehensive metabolic panel   Hemoglobin A1c   Lipid panel   TSH   Vitamin B12   VITAMIN D 25 Hydroxy (Vit-D Deficiency, Fractures)   Diabetes (HCC)   Relevant Medications   tirzepatide (MOUNJARO) 15 MG/0.5ML Pen   Other Relevant Orders   CBC with  Differential/Platelet   Comprehensive metabolic panel   Hemoglobin A1c   Lipid panel   TSH   Vitamin B12   VITAMIN D 25 Hydroxy (Vit-D Deficiency, Fractures)   Microalbumin / creatinine urine ratio   Angina pectoris (HCC)   Vitamin D deficiency   Relevant Orders   VITAMIN D 25 Hydroxy (Vit-D Deficiency, Fractures)   Hypertension associated with type 2 diabetes mellitus (HCC) - Primary   Relevant Medications   tirzepatide (MOUNJARO) 15 MG/0.5ML Pen   Other Relevant Orders   CBC with Differential/Platelet   Comprehensive metabolic panel   Uncontrolled type 2 diabetes mellitus with hyperglycemia (HCC)    Pt is losing weight -- con't HWW Check labs      Relevant Medications   tirzepatide (MOUNJARO) 15 MG/0.5ML Pen   Type 2 diabetes mellitus with diabetic peripheral angiopathy without gangrene, without long-term current use of insulin (HCC)   Relevant Medications   tirzepatide (MOUNJARO) 15 MG/0.5ML Pen   Morbid obesity (HCC)    con't HWW      Relevant Medications   tirzepatide (MOUNJARO) 15 MG/0.5ML Pen   Other Visit Diagnoses     Need for influenza vaccination       Relevant Orders   Flu Vaccine QUAD 71moIM (Fluarix, Fluzone & Alfiuria Quad PF) (Completed)      Meds ordered this encounter  Medications   tirzepatide (MOUNJARO) 15 MG/0.5ML Pen    Sig: Inject 15 mg into the skin once a week.    Dispense:  2 mL    Refill:  0    I, YAnn Held DO, personally preformed the services described in this documentation.  All medical record entries made by the scribe were at my direction and in my presence.  I have reviewed the chart and discharge instructions (if applicable) and agree that the record reflects my personal performance and is accurate and complete. 11/04/2021   I,Amber Collins,acting as a scribe for [employer] DO.,have documented all relevant documentation on the behalf of  Nicole Held, DO,as directed by  Nicole Held, DO while  in the presence of Nicole Held, DO.    Nicole Held, DO

## 2021-11-04 NOTE — Assessment & Plan Note (Signed)
con't HWW 

## 2021-11-04 NOTE — Assessment & Plan Note (Signed)
Tolerating statin, encouraged heart healthy diet, avoid trans fats, minimize simple carbs and saturated fats. Increase exercise as tolerated 

## 2021-11-04 NOTE — Assessment & Plan Note (Signed)
Pt is losing weight -- con't HWW Check labs

## 2021-11-04 NOTE — Assessment & Plan Note (Signed)
Well controlled, no changes to meds. Encouraged heart healthy diet such as the DASH diet and exercise as tolerated.  °

## 2021-11-05 LAB — INSULIN, RANDOM: Insulin: 23.6 u[IU]/mL — ABNORMAL HIGH

## 2021-11-06 ENCOUNTER — Telehealth: Payer: Self-pay

## 2021-11-06 MED ORDER — FREESTYLE LIBRE 14 DAY SENSOR MISC: 1.0000 | 3 refills | Status: DC

## 2021-11-06 NOTE — Telephone Encounter (Signed)
Form given to [place] during ov on 11/04/2021. Form completed and faxed today.

## 2021-11-11 ENCOUNTER — Ambulatory Visit: Payer: BC Managed Care – PPO | Admitting: Bariatrics

## 2021-11-18 ENCOUNTER — Other Ambulatory Visit: Payer: Self-pay | Admitting: Family Medicine

## 2021-11-18 DIAGNOSIS — E1169 Type 2 diabetes mellitus with other specified complication: Secondary | ICD-10-CM

## 2021-11-19 ENCOUNTER — Other Ambulatory Visit: Payer: Self-pay | Admitting: Family Medicine

## 2021-11-19 DIAGNOSIS — J302 Other seasonal allergic rhinitis: Secondary | ICD-10-CM

## 2021-11-29 ENCOUNTER — Other Ambulatory Visit: Payer: Self-pay | Admitting: Family Medicine

## 2021-11-29 DIAGNOSIS — K219 Gastro-esophageal reflux disease without esophagitis: Secondary | ICD-10-CM

## 2021-12-02 ENCOUNTER — Other Ambulatory Visit: Payer: Self-pay | Admitting: Family Medicine

## 2021-12-02 DIAGNOSIS — E1165 Type 2 diabetes mellitus with hyperglycemia: Secondary | ICD-10-CM

## 2021-12-03 ENCOUNTER — Encounter: Payer: Self-pay | Admitting: Bariatrics

## 2021-12-03 ENCOUNTER — Ambulatory Visit: Payer: BC Managed Care – PPO | Admitting: Bariatrics

## 2021-12-03 VITALS — BP 136/74 | HR 74 | Temp 98.0°F | Ht 61.0 in | Wt 145.0 lb

## 2021-12-03 DIAGNOSIS — I10 Essential (primary) hypertension: Secondary | ICD-10-CM | POA: Diagnosis not present

## 2021-12-03 DIAGNOSIS — E1151 Type 2 diabetes mellitus with diabetic peripheral angiopathy without gangrene: Secondary | ICD-10-CM | POA: Diagnosis not present

## 2021-12-03 DIAGNOSIS — Z6827 Body mass index (BMI) 27.0-27.9, adult: Secondary | ICD-10-CM

## 2021-12-03 DIAGNOSIS — Z7985 Long-term (current) use of injectable non-insulin antidiabetic drugs: Secondary | ICD-10-CM

## 2021-12-03 DIAGNOSIS — E669 Obesity, unspecified: Secondary | ICD-10-CM | POA: Insufficient documentation

## 2021-12-03 MED ORDER — TIRZEPATIDE 15 MG/0.5ML ~~LOC~~ SOAJ: 15.0000 mg | SUBCUTANEOUS | 0 refills | Status: DC

## 2021-12-16 NOTE — Progress Notes (Unsigned)
Chief Complaint:   OBESITY Nicole Richard is here to discuss her progress with her obesity treatment plan along with follow-up of her obesity related diagnoses. Nicole Richard is on {MWMwtlossportion/plan2:23431} and states she is following her eating plan approximately ***% of the time. Nicole Richard states she is *** *** minutes *** times per week.  Today's visit was #: *** Starting weight: *** Starting date: *** Today's weight: *** Today's date: 12/03/2021 Total lbs lost to date: *** Total lbs lost since last in-office visit: ***  Interim History: ***  Subjective:   1. Type 2 diabetes mellitus with diabetic peripheral angiopathy without gangrene, without long-term current use of insulin (HCC) ***  2. Primary hypertension ***  Assessment/Plan:   1. Type 2 diabetes mellitus with diabetic peripheral angiopathy without gangrene, without long-term current use of insulin (HCC) *** - tirzepatide (MOUNJARO) 15 MG/0.5ML Pen; Inject 15 mg into the skin once a week.  Dispense: 6 mL; Refill: 0  2. Primary hypertension ***  3. Obesity, Current BMI 27.5 Nicole Richard is currently in the action stage of change. As such, her goal is to continue with weight loss efforts. She has agreed to the Category 2 Plan.   Exercise goals: As is.   Behavioral modification strategies: increasing lean protein intake, decreasing simple carbohydrates, increasing vegetables, increasing water intake, decreasing eating out, no skipping meals, meal planning and cooking strategies, and keeping healthy foods in the home.  Nicole Richard has agreed to follow-up with our clinic in 4 weeks. She was informed of the importance of frequent follow-up visits to maximize her success with intensive lifestyle modifications for her multiple health conditions.   Objective:   Blood pressure 136/74, pulse 74, temperature 98 F (36.7 C), height '5\' 1"'$  (1.549 m), weight 145 lb (65.8 kg), SpO2 97 %. Body mass index is 27.4 kg/m.  General: Cooperative,  alert, well developed, in no acute distress. HEENT: Conjunctivae and lids unremarkable. Cardiovascular: Regular rhythm.  Lungs: Normal work of breathing. Neurologic: No focal deficits.   Lab Results  Component Value Date   CREATININE 0.68 11/04/2021   BUN 15 11/04/2021   NA 141 11/04/2021   K 4.7 11/04/2021   CL 105 11/04/2021   CO2 26 11/04/2021   Lab Results  Component Value Date   ALT 31 11/04/2021   AST 25 11/04/2021   ALKPHOS 118 (H) 11/04/2021   BILITOT 0.8 11/04/2021   Lab Results  Component Value Date   HGBA1C 6.4 11/04/2021   HGBA1C 6.4 (H) 07/09/2021   HGBA1C 6.7 (H) 04/08/2021   HGBA1C 6.6 (A) 03/18/2021   HGBA1C 6.5 (A) 12/18/2020   Lab Results  Component Value Date   INSULIN 40.1 (H) 07/09/2021   INSULIN 41.3 (H) 04/08/2021   INSULIN 46.9 (H) 05/16/2020   Lab Results  Component Value Date   TSH 0.59 11/04/2021   Lab Results  Component Value Date   CHOL 196 11/04/2021   HDL 60.50 11/04/2021   LDLCALC 109 (H) 11/04/2021   LDLDIRECT 128.1 10/05/2013   TRIG 130.0 11/04/2021   CHOLHDL 3 11/04/2021   Lab Results  Component Value Date   VD25OH 38.67 11/04/2021   VD25OH 71.4 07/09/2021   VD25OH 84.9 04/08/2021   Lab Results  Component Value Date   WBC 6.6 11/04/2021   HGB 14.5 11/04/2021   HCT 43.0 11/04/2021   MCV 88.1 11/04/2021   PLT 297.0 11/04/2021   No results found for: "IRON", "TIBC", "FERRITIN"  Attestation Statements:   Reviewed by clinician on day of  visit: allergies, medications, problem list, medical history, surgical history, family history, social history, and previous encounter notes.   Wilhemena Durie, am acting as Location manager for [employer], DO.  I have reviewed the above documentation for accuracy and completeness, and I agree with the above. -  ***

## 2021-12-17 ENCOUNTER — Encounter: Payer: Self-pay | Admitting: Bariatrics

## 2021-12-31 ENCOUNTER — Ambulatory Visit: Payer: BC Managed Care – PPO | Admitting: Bariatrics

## 2021-12-31 ENCOUNTER — Encounter: Payer: Self-pay | Admitting: Bariatrics

## 2021-12-31 VITALS — BP 124/78 | HR 75 | Temp 98.0°F | Ht 61.0 in | Wt 144.0 lb

## 2021-12-31 DIAGNOSIS — E1151 Type 2 diabetes mellitus with diabetic peripheral angiopathy without gangrene: Secondary | ICD-10-CM

## 2021-12-31 DIAGNOSIS — Z7985 Long-term (current) use of injectable non-insulin antidiabetic drugs: Secondary | ICD-10-CM

## 2021-12-31 DIAGNOSIS — Z6827 Body mass index (BMI) 27.0-27.9, adult: Secondary | ICD-10-CM

## 2021-12-31 DIAGNOSIS — E669 Obesity, unspecified: Secondary | ICD-10-CM

## 2021-12-31 DIAGNOSIS — I10 Essential (primary) hypertension: Secondary | ICD-10-CM

## 2022-01-14 ENCOUNTER — Encounter: Payer: Self-pay | Admitting: Bariatrics

## 2022-01-14 NOTE — Progress Notes (Signed)
Chief Complaint:   OBESITY Nicole Richard is here to discuss her progress with her obesity treatment plan along with follow-up of her obesity related diagnoses. Nicole Richard is on the Category 2 Plan and states she is following her eating plan approximately 90% of the time. Nicole Richard states she is doing 0 minutes 0 times per week.  Today's visit was #: 22 Starting weight: 163 lbs Starting date: 05/13/2020 Today's weight: 144 lbs Today's date: 12/31/2021 Total lbs lost to date: 19 Total lbs lost since last in-office visit: 1  Interim History: Nicole Richard is down 1 pound since her last visit.  Subjective:   1. Primary hypertension Nicole Richard's blood pressure is controlled.   2. Type 2 diabetes mellitus with diabetic peripheral angiopathy without gangrene, without long-term current use of insulin (Nicole Richard) Nicole Richard is taking Mounjaro and bromocriptine with no side effects. Her fasting blood sugar ranges between 80-110.  Assessment/Plan:   1. Primary hypertension Nicole Richard will continue her medications as directed.   2. Type 2 diabetes mellitus with diabetic peripheral angiopathy without gangrene, without long-term current use of insulin (Nicole Richard) Nicole Richard will continue Mounjaro. Additional recipes were given.   3. Obesity, Current BMI 27.4 Nicole Richard is currently in the action stage of change. As such, her goal is to continue with weight loss efforts. She has agreed to the Category 2 Plan.   Meal planning and intentional eating were discussed.  More healthier foods.  Patient wants to stay on track with a goal BMI of 25.  Exercise goals: walking at work. Go back to walking and wall push-ups.  Behavioral modification strategies: increasing lean protein intake, decreasing simple carbohydrates, increasing vegetables, increasing water intake, decreasing eating out, no skipping meals, meal planning and cooking strategies, keeping healthy foods in the home, and planning for success.  Nicole Richard has agreed to follow-up with our  clinic in 4 weeks. She was informed of the importance of frequent follow-up visits to maximize her success with intensive lifestyle modifications for her multiple health conditions.   Objective:   Blood pressure 124/78, pulse 75, temperature 98 F (36.7 C), height '5\' 1"'$  (1.549 m), weight 144 lb (65.3 kg), SpO2 98 %. Body mass index is 27.21 kg/m.  General: Cooperative, alert, well developed, in no acute distress. HEENT: Conjunctivae and lids unremarkable. Cardiovascular: Regular rhythm.  Lungs: Normal work of breathing. Neurologic: No focal deficits.   Lab Results  Component Value Date   CREATININE 0.68 11/04/2021   BUN 15 11/04/2021   NA 141 11/04/2021   K 4.7 11/04/2021   CL 105 11/04/2021   CO2 26 11/04/2021   Lab Results  Component Value Date   ALT 31 11/04/2021   AST 25 11/04/2021   ALKPHOS 118 (H) 11/04/2021   BILITOT 0.8 11/04/2021   Lab Results  Component Value Date   HGBA1C 6.4 11/04/2021   HGBA1C 6.4 (H) 07/09/2021   HGBA1C 6.7 (H) 04/08/2021   HGBA1C 6.6 (A) 03/18/2021   HGBA1C 6.5 (A) 12/18/2020   Lab Results  Component Value Date   INSULIN 40.1 (H) 07/09/2021   INSULIN 41.3 (H) 04/08/2021   INSULIN 46.9 (H) 05/16/2020   Lab Results  Component Value Date   TSH 0.59 11/04/2021   Lab Results  Component Value Date   CHOL 196 11/04/2021   HDL 60.50 11/04/2021   LDLCALC 109 (H) 11/04/2021   LDLDIRECT 128.1 10/05/2013   TRIG 130.0 11/04/2021   CHOLHDL 3 11/04/2021   Lab Results  Component Value Date   VD25OH 38.67 11/04/2021  VD25OH 71.4 07/09/2021   VD25OH 84.9 04/08/2021   Lab Results  Component Value Date   WBC 6.6 11/04/2021   HGB 14.5 11/04/2021   HCT 43.0 11/04/2021   MCV 88.1 11/04/2021   PLT 297.0 11/04/2021   No results found for: "IRON", "TIBC", "FERRITIN"  Attestation Statements:   Reviewed by clinician on day of visit: allergies, medications, problem list, medical history, surgical history, family history, social history,  and previous encounter notes.   Wilhemena Durie, am acting as Location manager for [employer], DO.  I have reviewed the above documentation for accuracy and completeness, and I agree with the above. Jearld Lesch, DO

## 2022-01-29 ENCOUNTER — Encounter: Payer: Self-pay | Admitting: Bariatrics

## 2022-01-29 ENCOUNTER — Ambulatory Visit: Payer: BC Managed Care – PPO | Admitting: Bariatrics

## 2022-01-29 VITALS — BP 122/77 | HR 91 | Temp 98.2°F | Ht 61.0 in | Wt 143.0 lb

## 2022-01-29 DIAGNOSIS — Z6827 Body mass index (BMI) 27.0-27.9, adult: Secondary | ICD-10-CM

## 2022-01-29 DIAGNOSIS — Z7985 Long-term (current) use of injectable non-insulin antidiabetic drugs: Secondary | ICD-10-CM

## 2022-01-29 DIAGNOSIS — E1169 Type 2 diabetes mellitus with other specified complication: Secondary | ICD-10-CM | POA: Diagnosis not present

## 2022-01-29 DIAGNOSIS — E785 Hyperlipidemia, unspecified: Secondary | ICD-10-CM

## 2022-01-29 DIAGNOSIS — E1151 Type 2 diabetes mellitus with diabetic peripheral angiopathy without gangrene: Secondary | ICD-10-CM

## 2022-01-29 DIAGNOSIS — E669 Obesity, unspecified: Secondary | ICD-10-CM

## 2022-02-08 NOTE — Progress Notes (Signed)
Chief Complaint:   OBESITY Nicole Richard is here to discuss her progress with her obesity treatment plan along with follow-up of her obesity related diagnoses. Nicole Richard is on the Category 2 Plan and states she is following her eating plan approximately 25% of the time. Nicole Richard states she is not currently exercising.  Today's visit was #: 23 Starting weight: 163 lbs Starting date: 05/13/2020 Today's weight: 143 lbs Today's date: 01/29/2022 Total lbs lost to date: 20 Total lbs lost since last in-office visit: 1  Interim History: Nicole Richard is down 1 lbs since her last visit over the holidays. She is getting her protein and doing well with fruits and vegetables.  Subjective:   1. Type 2 diabetes mellitus with diabetic peripheral angiopathy without gangrene, without long-term current use of insulin (HCC) Nicole Richard is taking Mounjaro and bromocriptine. Her fasting blood sugar runs 100-120.  2. Hyperlipidemia associated with type 2 diabetes mellitus ([place]) Nicole Richard is taking Lipitor. Her labs are up to date.  Assessment/Plan:   1. Type 2 diabetes mellitus with diabetic peripheral angiopathy without gangrene, without long-term current use of insulin (HCC) Continue Mounjaro. Nicole Richard will adhere more to her plan.  2. Hyperlipidemia associated with type 2 diabetes mellitus ([place]) Continue Lipitor. Nicole Richard will focus on healthy fats and avoid trans fats.  3. Obesity, Current BMI 27.1 Nicole Richard is currently in the action stage of change. As such, her goal is to continue with weight loss efforts. She has agreed to the Category 2 Plan and keeping a food journal and adhering to recommended goals of 1200 calories and 80 grams protein.   Meal planning Nicole Richard will get back on track. Protein sheet provided  Exercise goals:  As is  Behavioral modification strategies: increasing lean protein intake, decreasing simple carbohydrates, increasing vegetables, increasing water intake, decreasing eating out, no skipping  meals, meal planning and cooking strategies, keeping healthy foods in the home, and planning for success.  Nicole Richard has agreed to follow-up with our clinic in 4 weeks. She was informed of the importance of frequent follow-up visits to maximize her success with intensive lifestyle modifications for her multiple health conditions.   Objective:   Blood pressure 122/77, pulse 91, temperature 98.2 F (36.8 C), height '5\' 1"'$  (1.549 m), weight 143 lb (64.9 kg), SpO2 96 %. Body mass index is 27.02 kg/m.  General: Cooperative, alert, well developed, in no acute distress. HEENT: Conjunctivae and lids unremarkable. Cardiovascular: Regular rhythm.  Lungs: Normal work of breathing. Neurologic: No focal deficits.   Lab Results  Component Value Date   CREATININE 0.68 11/04/2021   BUN 15 11/04/2021   NA 141 11/04/2021   K 4.7 11/04/2021   CL 105 11/04/2021   CO2 26 11/04/2021   Lab Results  Component Value Date   ALT 31 11/04/2021   AST 25 11/04/2021   ALKPHOS 118 (H) 11/04/2021   BILITOT 0.8 11/04/2021   Lab Results  Component Value Date   HGBA1C 6.4 11/04/2021   HGBA1C 6.4 (H) 07/09/2021   HGBA1C 6.7 (H) 04/08/2021   HGBA1C 6.6 (A) 03/18/2021   HGBA1C 6.5 (A) 12/18/2020   Lab Results  Component Value Date   INSULIN 40.1 (H) 07/09/2021   INSULIN 41.3 (H) 04/08/2021   INSULIN 46.9 (H) 05/16/2020   Lab Results  Component Value Date   TSH 0.59 11/04/2021   Lab Results  Component Value Date   CHOL 196 11/04/2021   HDL 60.50 11/04/2021   LDLCALC 109 (H) 11/04/2021   LDLDIRECT 128.1 10/05/2013  TRIG 130.0 11/04/2021   CHOLHDL 3 11/04/2021   Lab Results  Component Value Date   VD25OH 38.67 11/04/2021   VD25OH 71.4 07/09/2021   VD25OH 84.9 04/08/2021   Lab Results  Component Value Date   WBC 6.6 11/04/2021   HGB 14.5 11/04/2021   HCT 43.0 11/04/2021   MCV 88.1 11/04/2021   PLT 297.0 11/04/2021   Attestation Statements:   Reviewed by clinician on day of visit:  allergies, medications, problem list, medical history, surgical history, family history, social history, and previous encounter notes.  I, Kathlene November, BS, CMA, am acting as transcriptionist for [employer], DO.  I have reviewed the above documentation for accuracy and completeness, and I agree with the above. Jearld Lesch, DO

## 2022-02-10 ENCOUNTER — Encounter: Payer: Self-pay | Admitting: Bariatrics

## 2022-02-26 ENCOUNTER — Encounter: Payer: Self-pay | Admitting: Bariatrics

## 2022-02-26 ENCOUNTER — Ambulatory Visit: Payer: BC Managed Care – PPO | Admitting: Bariatrics

## 2022-02-26 VITALS — BP 138/76 | HR 74 | Temp 97.8°F | Ht 61.0 in | Wt 144.0 lb

## 2022-02-26 DIAGNOSIS — Z6826 Body mass index (BMI) 26.0-26.9, adult: Secondary | ICD-10-CM | POA: Insufficient documentation

## 2022-02-26 DIAGNOSIS — Z6827 Body mass index (BMI) 27.0-27.9, adult: Secondary | ICD-10-CM

## 2022-02-26 DIAGNOSIS — E1151 Type 2 diabetes mellitus with diabetic peripheral angiopathy without gangrene: Secondary | ICD-10-CM

## 2022-02-26 DIAGNOSIS — Z7985 Long-term (current) use of injectable non-insulin antidiabetic drugs: Secondary | ICD-10-CM

## 2022-02-26 DIAGNOSIS — I10 Essential (primary) hypertension: Secondary | ICD-10-CM

## 2022-02-26 DIAGNOSIS — E669 Obesity, unspecified: Secondary | ICD-10-CM

## 2022-02-26 MED ORDER — TIRZEPATIDE 15 MG/0.5ML ~~LOC~~ SOAJ: 15.0000 mg | SUBCUTANEOUS | 0 refills | Status: DC

## 2022-03-07 ENCOUNTER — Other Ambulatory Visit: Payer: Self-pay | Admitting: Family Medicine

## 2022-03-07 DIAGNOSIS — E1169 Type 2 diabetes mellitus with other specified complication: Secondary | ICD-10-CM

## 2022-03-12 NOTE — Progress Notes (Unsigned)
Chief Complaint:   OBESITY Nicole Richard is here to discuss her progress with her obesity treatment plan along with follow-up of her obesity related diagnoses. Nicole Richard is on the Category 2 plan and keeping a food journal with goal of 1200 calories and 80 grams of protein daily and states she is following her eating plan approximately 90% of the time. Nicole Richard states she has not been exercising.  Today's visit was #: 24 Starting weight: 163 lbs Starting date: 05/13/2020 Today's weight: 144 lbs Today's date: 02/26/22 Total lbs lost to date: 19 Total lbs lost since last in-office visit: +1  Interim History: She is up 1 pound since her last visit.  She is going to get in more water.  Subjective:   1. Type 2 diabetes mellitus with diabetic peripheral angiopathy without gangrene, without long-term current use of insulin ([place]) Taking Mounjaro.  Fasting blood sugar 78-126.  2. Essential hypertension Taking atenolol, controlled.  Assessment/Plan:   1. Type 2 diabetes mellitus with diabetic peripheral angiopathy without gangrene, without long-term current use of insulin ([place]) 1.  Continue medications. 2.  Refill: - tirzepatide (MOUNJARO) 15 MG/0.5ML Pen; Inject 15 mg into the skin once a week.  Dispense: 6 mL; Refill: 0  2. Essential hypertension 1. Continue medication 2.  Increase exercise.  3. Generalized Obesity, BMI 27.0-27.9,adult 1.  Meal planning 2.  Intentional eating 3.  Increase water  Nicole Richard is currently in the action stage of change. As such, her goal is to continue with weight loss efforts. She has agreed to the Category 2 plan and keeping a food journal with goal of 1200 calories and 80 grams of protein daily.  Exercise goals: Continue walking and wall exercises.  Behavioral modification strategies: increasing lean protein intake, decreasing simple carbohydrates, increasing vegetables, increasing water intake, decreasing eating out, no skipping meals, meal planning and  cooking strategies, keeping healthy foods in the home, and planning for success.  Nicole Richard has agreed to follow-up with our clinic in 4 weeks, labs next visit. She was informed of the importance of frequent follow-up visits to maximize her success with intensive lifestyle modifications for her multiple health conditions.   Objective:   Blood pressure 138/76, pulse 74, temperature 97.8 F (36.6 C), height 5' 1"$  (1.549 m), weight 144 lb (65.3 kg), SpO2 96 %. Body mass index is 27.21 kg/m.  General: Cooperative, alert, well developed, in no acute distress. HEENT: Conjunctivae and lids unremarkable. Cardiovascular: Regular rhythm.  Lungs: Normal work of breathing. Neurologic: No focal deficits.   Lab Results  Component Value Date   CREATININE 0.68 11/04/2021   BUN 15 11/04/2021   NA 141 11/04/2021   K 4.7 11/04/2021   CL 105 11/04/2021   CO2 26 11/04/2021   Lab Results  Component Value Date   ALT 31 11/04/2021   AST 25 11/04/2021   ALKPHOS 118 (H) 11/04/2021   BILITOT 0.8 11/04/2021   Lab Results  Component Value Date   HGBA1C 6.4 11/04/2021   HGBA1C 6.4 (H) 07/09/2021   HGBA1C 6.7 (H) 04/08/2021   HGBA1C 6.6 (A) 03/18/2021   HGBA1C 6.5 (A) 12/18/2020   Lab Results  Component Value Date   INSULIN 40.1 (H) 07/09/2021   INSULIN 41.3 (H) 04/08/2021   INSULIN 46.9 (H) 05/16/2020   Lab Results  Component Value Date   TSH 0.59 11/04/2021   Lab Results  Component Value Date   CHOL 196 11/04/2021   HDL 60.50 11/04/2021   LDLCALC 109 (H) 11/04/2021  LDLDIRECT 128.1 10/05/2013   TRIG 130.0 11/04/2021   CHOLHDL 3 11/04/2021   Lab Results  Component Value Date   VD25OH 38.67 11/04/2021   VD25OH 71.4 07/09/2021   VD25OH 84.9 04/08/2021   Lab Results  Component Value Date   WBC 6.6 11/04/2021   HGB 14.5 11/04/2021   HCT 43.0 11/04/2021   MCV 88.1 11/04/2021   PLT 297.0 11/04/2021   No results found for: "IRON", "TIBC", "FERRITIN"  Attestation Statements:    Reviewed by clinician on day of visit: allergies, medications, problem list, medical history, surgical history, family history, social history, and previous encounter notes.  I, Dawn Whitmire, FNP-C, am acting as transcriptionist for Dr. Jearld Lesch.  I have reviewed the above documentation for accuracy and completeness, and I agree with the above. Jearld Lesch, DO

## 2022-03-13 ENCOUNTER — Encounter: Payer: Self-pay | Admitting: Bariatrics

## 2022-04-01 ENCOUNTER — Ambulatory Visit: Payer: BC Managed Care – PPO | Admitting: Bariatrics

## 2022-04-01 ENCOUNTER — Encounter: Payer: Self-pay | Admitting: Bariatrics

## 2022-04-01 VITALS — BP 110/72 | HR 73 | Temp 98.0°F | Ht 61.0 in | Wt 142.0 lb

## 2022-04-01 DIAGNOSIS — Z6826 Body mass index (BMI) 26.0-26.9, adult: Secondary | ICD-10-CM

## 2022-04-01 DIAGNOSIS — I10 Essential (primary) hypertension: Secondary | ICD-10-CM | POA: Diagnosis not present

## 2022-04-01 DIAGNOSIS — E669 Obesity, unspecified: Secondary | ICD-10-CM | POA: Diagnosis not present

## 2022-04-01 DIAGNOSIS — E1151 Type 2 diabetes mellitus with diabetic peripheral angiopathy without gangrene: Secondary | ICD-10-CM

## 2022-04-01 DIAGNOSIS — Z7985 Long-term (current) use of injectable non-insulin antidiabetic drugs: Secondary | ICD-10-CM

## 2022-04-01 DIAGNOSIS — E559 Vitamin D deficiency, unspecified: Secondary | ICD-10-CM | POA: Diagnosis not present

## 2022-04-01 MED ORDER — TIRZEPATIDE 15 MG/0.5ML ~~LOC~~ SOAJ: 15.0000 mg | SUBCUTANEOUS | 0 refills | Status: DC

## 2022-04-03 LAB — LIPID PANEL WITH LDL/HDL RATIO
Cholesterol, Total: 140 mg/dL (ref 100–199)
HDL: 53 mg/dL (ref >39)
LDL Chol Calc (NIH): 70 mg/dL (ref 0–99)
LDL/HDL Ratio: 1.3 ratio (ref 0.0–3.2)
Triglycerides: 87 mg/dL (ref 0–149)
VLDL Cholesterol Cal: 17 mg/dL (ref 5–40)

## 2022-04-03 LAB — HEMOGLOBIN A1C
Est. average glucose Bld gHb Est-mCnc: 128 mg/dL
Hgb A1c MFr Bld: 6.1 % — ABNORMAL HIGH (ref 4.8–5.6)

## 2022-04-03 LAB — COMPREHENSIVE METABOLIC PANEL
ALT: 23 IU/L (ref 0–32)
AST: 21 IU/L (ref 0–40)
Albumin/Globulin Ratio: 1.9 (ref 1.2–2.2)
Albumin: 4.5 g/dL (ref 3.9–4.9)
Alkaline Phosphatase: 80 IU/L (ref 44–121)
BUN/Creatinine Ratio: 30 — ABNORMAL HIGH (ref 12–28)
BUN: 25 mg/dL (ref 8–27)
Bilirubin Total: 0.4 mg/dL (ref 0.0–1.2)
CO2: 24 mmol/L (ref 20–29)
Calcium: 9.9 mg/dL (ref 8.7–10.3)
Chloride: 106 mmol/L (ref 96–106)
Creatinine, Ser: 0.84 mg/dL (ref 0.57–1.00)
Globulin, Total: 2.4 g/dL (ref 1.5–4.5)
Glucose: 104 mg/dL — ABNORMAL HIGH (ref 70–99)
Potassium: 4.5 mmol/L (ref 3.5–5.2)
Sodium: 143 mmol/L (ref 134–144)
Total Protein: 6.9 g/dL (ref 6.0–8.5)
eGFR: 78 mL/min/{1.73_m2} (ref >59)

## 2022-04-03 LAB — INSULIN, RANDOM: INSULIN: 21.8 u[IU]/mL (ref 2.6–24.9)

## 2022-04-03 LAB — VITAMIN D 25 HYDROXY (VIT D DEFICIENCY, FRACTURES): Vit D, 25-Hydroxy: 35.9 ng/mL (ref 30.0–100.0)

## 2022-04-09 NOTE — Progress Notes (Signed)
Chief Complaint:   OBESITY Nicole Richard is here to discuss her progress with her obesity treatment plan along with follow-up of her obesity related diagnoses. Nicole Richard is on the Category 2 plan and keeping a food journal with goal of 1200 calories and 80 grams of protein daily and states she is following her eating plan approximately 50% of the time. Nicole Richard states she has not been exercising.  Today's visit was #: 25 Starting weight: 163 lbs Starting date: 05/13/2020 Today's weight: 142 lbs Today's date: 04/01/22 Total lbs lost to date: 21 Total lbs lost since last in-office visit: -2  Interim History: She is down 2 pounds since her last visit.  She is getting extra protein.  Subjective:   1. Type 2 diabetes mellitus with diabetic peripheral angiopathy without gangrene, without long-term current use of insulin ([place]) Mounjaro helps with appetite  2. Essential hypertension Blood pressure controlled. Taking atenolol.  3. Vitamin D deficiency Taking vitamin D.   Assessment/Plan:   1. Type 2 diabetes mellitus with diabetic peripheral angiopathy without gangrene, without long-term current use of insulin (Nicole Richard) 1.  Refill- - tirzepatide (MOUNJARO) 15 MG/0.5ML Pen; Inject 15 mg into the skin once a week.  Dispense: 6 mL; Refill: 0 2.  Check labs- - Comprehensive metabolic panel - Lipid Panel With LDL/HDL Ratio - Insulin, random - Hemoglobin A1c  2. Essential hypertension 1.  Continue medications. 2.  Check labs- - Comprehensive metabolic panel - Lipid Panel With LDL/HDL Ratio  3. Vitamin D deficiency 1.  Check vitamin D level - VITAMIN D 25 Hydroxy (Vit-D Deficiency, Fractures)  4. Generalized obesity BMI 26.0-26.9,adult 1.  Will buy herself snacks.  Nicole Richard is currently in the action stage of change. As such, her goal is to continue with weight loss efforts. She has agreed to the Category 2 plan.  Exercise goals: Will resume exercise.  Behavioral modification strategies:  increasing lean protein intake, decreasing simple carbohydrates, increasing vegetables, increasing water intake, decreasing eating out, no skipping meals, meal planning and cooking strategies, keeping healthy foods in the home, and planning for success.  Nicole Richard has agreed to follow-up with our clinic in 4 weeks. She was informed of the importance of frequent follow-up visits to maximize her success with intensive lifestyle modifications for her multiple health conditions.   Nicole Richard was informed we would discuss her lab results at her next visit unless there is a critical issue that needs to be addressed sooner. Nicole Richard agreed to keep her next visit at the agreed upon time to discuss these results.  Objective:   Blood pressure 110/72, pulse 73, temperature 98 F (36.7 C), height 5\' 1"  (1.549 m), weight 142 lb (64.4 kg), SpO2 97 %. Body mass index is 26.83 kg/m.  General: Cooperative, alert, well developed, in no acute distress. HEENT: Conjunctivae and lids unremarkable. Cardiovascular: Regular rhythm.  Lungs: Normal work of breathing. Neurologic: No focal deficits.   Lab Results  Component Value Date   CREATININE 0.84 04/01/2022   BUN 25 04/01/2022   NA 143 04/01/2022   K 4.5 04/01/2022   CL 106 04/01/2022   CO2 24 04/01/2022   Lab Results  Component Value Date   ALT 23 04/01/2022   AST 21 04/01/2022   ALKPHOS 80 04/01/2022   BILITOT 0.4 04/01/2022   Lab Results  Component Value Date   HGBA1C 6.1 (H) 04/01/2022   HGBA1C 6.4 11/04/2021   HGBA1C 6.4 (H) 07/09/2021   HGBA1C 6.7 (H) 04/08/2021   HGBA1C 6.6 (A)  03/18/2021   Lab Results  Component Value Date   INSULIN 21.8 04/01/2022   INSULIN 40.1 (H) 07/09/2021   INSULIN 41.3 (H) 04/08/2021   INSULIN 46.9 (H) 05/16/2020   Lab Results  Component Value Date   TSH 0.59 11/04/2021   Lab Results  Component Value Date   CHOL 140 04/01/2022   HDL 53 04/01/2022   LDLCALC 70 04/01/2022   LDLDIRECT 128.1 10/05/2013   TRIG  87 04/01/2022   CHOLHDL 3 11/04/2021   Lab Results  Component Value Date   VD25OH 35.9 04/01/2022   VD25OH 38.67 11/04/2021   VD25OH 71.4 07/09/2021   Lab Results  Component Value Date   WBC 6.6 11/04/2021   HGB 14.5 11/04/2021   HCT 43.0 11/04/2021   MCV 88.1 11/04/2021   PLT 297.0 11/04/2021   No results found for: "IRON", "TIBC", "FERRITIN"  Attestation Statements:   Reviewed by clinician on day of visit: allergies, medications, problem list, medical history, surgical history, family history, social history, and previous encounter notes.  I, Dawn Whitmire, FNP-C, am acting as transcriptionist for Dr. Jearld Lesch.  I have reviewed the above documentation for accuracy and completeness, and I agree with the above. Jearld Lesch, DO

## 2022-04-16 ENCOUNTER — Encounter: Payer: Self-pay | Admitting: Bariatrics

## 2022-04-29 ENCOUNTER — Encounter: Payer: Self-pay | Admitting: Bariatrics

## 2022-04-29 ENCOUNTER — Ambulatory Visit: Payer: BC Managed Care – PPO | Admitting: Bariatrics

## 2022-04-29 VITALS — BP 114/74 | HR 67 | Temp 97.9°F | Ht 61.0 in | Wt 139.0 lb

## 2022-04-29 DIAGNOSIS — Z7985 Long-term (current) use of injectable non-insulin antidiabetic drugs: Secondary | ICD-10-CM

## 2022-04-29 DIAGNOSIS — Z6826 Body mass index (BMI) 26.0-26.9, adult: Secondary | ICD-10-CM

## 2022-04-29 DIAGNOSIS — E669 Obesity, unspecified: Secondary | ICD-10-CM

## 2022-04-29 DIAGNOSIS — E1151 Type 2 diabetes mellitus with diabetic peripheral angiopathy without gangrene: Secondary | ICD-10-CM | POA: Diagnosis not present

## 2022-04-29 DIAGNOSIS — I10 Essential (primary) hypertension: Secondary | ICD-10-CM

## 2022-04-29 NOTE — Progress Notes (Signed)
   WEIGHT SUMMARY AND BIOMETRICS  Weight Lost Since Last Visit: 3lb   Vitals Temp: 97.9 F (36.6 C) BP: 114/74 Pulse Rate: 67 SpO2: 99 %   Anthropometric Measurements Height: 5\' 1"  (1.549 m) Weight: 139 lb (63 kg) BMI (Calculated): 26.28 Weight at Last Visit: 142lb Weight Lost Since Last Visit: 3lb Starting Weight: 163lb Total Weight Loss (lbs): 24 lb (10.9 kg)   Body Composition  Body Fat %: 36.3 % Fat Mass (lbs): 50.8 lbs Muscle Mass (lbs): 84.4 lbs Total Body Water (lbs): 60.8 lbs Visceral Fat Rating : 9   Other Clinical Data Fasting: yes Labs: no Today's Visit #: 26 Starting Date: 05/13/20    OBESITY Nicole Richard is here to discuss her progress with her obesity treatment plan along with follow-up of her obesity related diagnoses.     Nutrition Plan: the Category 2 plan - 75% adherence.  Current exercise: walking  Interim History:  She is down another 3 lbs since her last visit.  Protein intake is as prescribed, Is not skipping meals, and Meeting calorie goals.  Pharmacotherapy: Nicole Richard is on Mounjaro 15 mg SQ weekly Adverse side effects: None Hunger is well controlled.  Cravings are well controlled.  Assessment/Plan:   1. Type 2 diabetes mellitus with diabetic peripheral angiopathy without gangrene, without long-term current use of insulin  She is taking Mounjaro as directed.   2. Essential HTN  She is stable.  Plan:  Continue medications.  Will keep sodium to a maximum of  1,500 mg daily.    Generalized Obesity: Current BMI BMI (Calculated): 26.28   Pharmacotherapy Plan Continue  Mounjaro 15 mg SQ weekly  Nicole Richard is currently in the action stage of change. As such, her goal is to continue with weight loss efforts.  She has agreed to the Category 2 plan.  Exercise goals: For substantial health benefits, adults should do at least 150 minutes (2 hours and 30 minutes) a week of moderate-intensity, or 75 minutes (1 hour and 15 minutes)  a week of vigorous-intensity aerobic physical activity, or an equivalent combination of moderate- and vigorous-intensity aerobic activity. Aerobic activity should be performed in episodes of at least 10 minutes, and preferably, it should be spread throughout the week.  Behavioral modification strategies: meal planning , planning for success, and increasing vegetables.  Nicole Richard has agreed to follow-up with our clinic in 4 weeks.   No orders of the defined types were placed in this encounter.   Medications Discontinued During This Encounter  Medication Reason   Flaxseed, Linseed, (FLAXSEED OIL PO) Patient Preference     No orders of the defined types were placed in this encounter.     Objective:   VITALS: Per patient if applicable, see vitals. GENERAL: Alert and in no acute distress. CARDIOPULMONARY: No increased WOB. Speaking in clear sentences.  PSYCH: Pleasant and cooperative. Speech normal rate and rhythm. Affect is appropriate. Insight and judgement are appropriate. Attention is focused, linear, and appropriate.  NEURO: Oriented as arrived to appointment on time with no prompting.   Attestation Statements:    This was prepared with the assistance of Engineer, civil (consulting).  Occasional wrong-word or sound-a-like substitutions may have occurred due to the inherent limitations of voice recognition software.   Corinna Capra, DO

## 2022-05-08 ENCOUNTER — Ambulatory Visit: Payer: BC Managed Care – PPO | Admitting: Family Medicine

## 2022-05-08 ENCOUNTER — Encounter: Payer: Self-pay | Admitting: Family Medicine

## 2022-05-08 VITALS — BP 110/70 | HR 73 | Temp 98.5°F | Resp 12 | Ht 61.0 in | Wt 144.6 lb

## 2022-05-08 DIAGNOSIS — I152 Hypertension secondary to endocrine disorders: Secondary | ICD-10-CM

## 2022-05-08 DIAGNOSIS — E785 Hyperlipidemia, unspecified: Secondary | ICD-10-CM

## 2022-05-08 DIAGNOSIS — E1159 Type 2 diabetes mellitus with other circulatory complications: Secondary | ICD-10-CM

## 2022-05-08 DIAGNOSIS — E1165 Type 2 diabetes mellitus with hyperglycemia: Secondary | ICD-10-CM | POA: Diagnosis not present

## 2022-05-08 DIAGNOSIS — Z0000 Encounter for general adult medical examination without abnormal findings: Secondary | ICD-10-CM

## 2022-05-08 DIAGNOSIS — E1151 Type 2 diabetes mellitus with diabetic peripheral angiopathy without gangrene: Secondary | ICD-10-CM | POA: Diagnosis not present

## 2022-05-08 DIAGNOSIS — E782 Mixed hyperlipidemia: Secondary | ICD-10-CM

## 2022-05-08 DIAGNOSIS — I10 Essential (primary) hypertension: Secondary | ICD-10-CM

## 2022-05-08 DIAGNOSIS — E1169 Type 2 diabetes mellitus with other specified complication: Secondary | ICD-10-CM

## 2022-05-08 NOTE — Assessment & Plan Note (Signed)
Lab Results  Component Value Date   HGBA1C 6.1 (H) 04/01/2022   hgba1c acceptable, minimize simple carbs. Increase exercise as tolerated. Continue current meds

## 2022-05-08 NOTE — Progress Notes (Addendum)
Subjective:   By signing my name below, I, Nicole Richard, attest that this documentation has been prepared under the direction and in the presence of [name], DO. 05/08/2022   Patient ID: Nicole Richard, female    DOB: 08-12-1955, 67 y.o.   MRN: [id number]  Chief Complaint  Patient presents with   6 month follow up    HPI Patient is in today for a follow up visit.   She is switching to medicare on June 1st, 2024.  She continues seeing a healthy weight and wellness clinic and reports losing weight while seeing them. Wt Readings from Last 3 Encounters:  05/08/22 144 lb 9.6 oz (65.6 kg)  04/29/22 139 lb (63 kg)  04/01/22 142 lb (64.4 kg)   Her allergies are stable at this time.  She denies any swelling in her ankles.    Past Medical History:  Diagnosis Date   Abdominal pain, left upper quadrant 03/01/2013   Abnormal liver function tests 03/01/2013   Allergy    seasonal   Angina at rest 03/21/2020   Angina pectoris 03/21/2020   Asthma    Back pain    BREAST IMPLANTS, BILATERAL, HX OF 07/28/2007   Qualifier: Diagnosis of  By: Janit Bern     CYSTOCELE WITH INCOMPLETE UTERINE PROLAPSE 05/18/2006   Qualifier: Diagnosis of  By: Janit Bern     Diabetes 01/29/2014   Diabetes mellitus without complication    Edema of both lower extremities    ESOPHAGEAL STRICTURE 11/09/2009   Qualifier: Diagnosis of  By: Creta Levin CMA (AAMA), [name]     Essential hypertension 05/18/2006   Qualifier: Diagnosis of  By: Janit Bern     Extrinsic asthma with exacerbation, unspecified asthma severity 10/26/2020   GERD 09/14/2009   Qualifier: Diagnosis of  By: Janit Bern     GERD (gastroesophageal reflux disease)    HEADACHE 05/18/2006   Qualifier: Diagnosis of  By: Janit Bern     Hirsutism 05/18/2006   Qualifier: Diagnosis of  By: Janit Bern     Hyperlipidemia    Hyperlipidemia associated with type 2 diabetes mellitus 04/15/2019   Hypertension    Kidney stones     LAPAROSCOPY, HX OF 05/18/2006   Qualifier: Diagnosis of  By: Janit Bern     Mixed dyslipidemia 06/22/2020   Nonspecific (abnormal) findings on radiological and other examination of biliary tract 03/01/2013   Obesity (BMI 30-39.9) 10/18/2012   Other chest pain 03/21/2020   Palpitation 03/21/2020   Palpitations    POSTMENOPAUSAL STATUS 09/14/2009   Qualifier: Diagnosis of  By: Janit Bern     Preventative health care 10/26/2020   Seasonal allergic rhinitis 10/26/2020   SKIN TAG 10/05/2009   Qualifier: Diagnosis of  By: Janit Bern     SOB (shortness of breath)    Uncontrolled type 2 diabetes mellitus with hyperglycemia 04/15/2019   Vitamin D deficiency    Vitamin D insufficiency 05/21/2020    Past Surgical History:  Procedure Laterality Date   ABDOMINAL HYSTERECTOMY  08/2004   APPENDECTOMY     AUGMENTATION MAMMAPLASTY Bilateral 2000   BREAST ENHANCEMENT SURGERY  2001   COLONOSCOPY     LEFT HEART CATH AND CORONARY ANGIOGRAPHY N/A 04/09/2020   Procedure: LEFT HEART CATH AND CORONARY ANGIOGRAPHY;  Surgeon: Marykay Lex, MD;  Location: [facility] INVASIVE CV LAB;  Service: Cardiovascular;  Laterality: N/A;   POLYPECTOMY  2009   sigmoid polyps  SHOULDER ARTHROSCOPY W/ ROTATOR CUFF REPAIR Right 06/06/2015   caffrey   TONSILLECTOMY     TUBAL LIGATION      Family History  Problem Relation Age of Onset   Rheumatic fever Mother    Irritable bowel syndrome Mother    Breast cancer Mother    Diabetes Mother    High blood pressure Mother    High Cholesterol Mother    Heart disease Mother    Stroke Mother    Sudden death Mother    Obesity Mother    Cancer Father    Diabetes Father    Colon polyps Father    Kidney disease Father    High blood pressure Father    High Cholesterol Father    Heart disease Father    Obesity Father    Cervical cancer Maternal Grandmother    Breast cancer Maternal Grandmother    Colon cancer Maternal Grandmother    Uterine cancer Maternal Grandmother     Coronary artery disease Other    Hyperlipidemia Other    Hypertension Other    Asthma Other    Esophageal cancer Neg Hx    Rectal cancer Neg Hx    Stomach cancer Neg Hx     Social History   Socioeconomic History   Marital status: Divorced    Spouse name: Not on file   Number of children: 2   Years of education: Not on file   Highest education level: Professional school degree (e.g., MD, DDS, DVM, JD)  Occupational History   Occupation: Nurse    Comment: [employer], Scientist, clinical (histocompatibility and immunogenetics)  Tobacco Use   Smoking status: Never   Smokeless tobacco: Never  Vaping Use   Vaping Use: Never used  Substance and Sexual Activity   Alcohol use: Yes    Comment: rare   Drug use: No   Sexual activity: Not Currently    Partners: Male  Other Topics Concern   Not on file  Social History Narrative   Exercise--walking , and pt daily   Social Determinants of Health   Financial Resource Strain: Low Risk  (05/07/2022)   Overall Financial Resource Strain (CARDIA)    Difficulty of Paying Living Expenses: Not hard at all  Food Insecurity: No Food Insecurity (05/07/2022)   Hunger Vital Sign    Worried About Running Out of Food in the Last Year: Never true    Ran Out of Food in the Last Year: Never true  Transportation Needs: No Transportation Needs (05/07/2022)   PRAPARE - Administrator, Civil Service (Medical): No    Lack of Transportation (Non-Medical): No  Physical Activity: Insufficiently Active (05/07/2022)   Exercise Vital Sign    Days of Exercise per Week: 2 days    Minutes of Exercise per Session: 30 min  Stress: No Stress Concern Present (05/07/2022)   [employer] of Occupational Health - Occupational Stress Questionnaire    Feeling of Stress : Only a little  Social Connections: Moderately Isolated (05/07/2022)   Social Connection and Isolation Panel [NHANES]    Frequency of Communication with Friends and Family: Twice a week    Frequency of Social Gatherings with Friends and  Family: Once a week    Attends Religious Services: 1 to 4 times per year    Active Member of [employer] or Organizations: No    Attends Engineer, structural: Not on file    Marital Status: Divorced  Intimate Partner Violence: Not on file    Outpatient Medications  Prior to Visit  Medication Sig Dispense Refill   aspirin 81 MG chewable tablet Chew 81 mg by mouth at bedtime.     atenolol (TENORMIN) 25 MG tablet TAKE 1 TABLET BY MOUTH EVERY DAY 90 tablet 3   atorvastatin (LIPITOR) 40 MG tablet TAKE 1 TABLET BY MOUTH EVERYDAY AT BEDTIME 90 tablet 1   bromocriptine (PARLODEL) 2.5 MG tablet TAKE 0.5 TABLETS BY MOUTH DAILY. (Patient taking differently: Per patient is taking 1/4) 45 tablet 3   Calcium Carbonate-Vitamin D (OSCAL 500/200 D-3 PO) Take 1 tablet by mouth at bedtime.     Cholecalciferol (VITAMIN D-3) 25 MCG (1000 UT) CAPS Take 1 capsule by mouth daily.     Continuous Blood Gluc Sensor (FREESTYLE LIBRE 14 DAY SENSOR) MISC 1 each by Does not apply route every 14 (fourteen) days. 6 each 3   CRANBERRY-VITAMIN C PO Take 2 capsules by mouth at bedtime.     fenofibrate 160 MG tablet TAKE 1 TABLET BY MOUTH EVERY DAY 90 tablet 1   Flaxseed, Linseed, ([employer] FLAXSEED OIL) 1000 MG CAPS Take 1 capsule by mouth daily.     Ipratropium-Albuterol (COMBIVENT RESPIMAT) 20-100 MCG/ACT AERS respimat Inhale 1 puff into the lungs 4 (four) times daily as needed for wheezing or shortness of breath. 4 g 5   mometasone (NASONEX) 50 MCG/ACT nasal spray Place 2 sprays into the nose daily.     montelukast (SINGULAIR) 10 MG tablet TAKE 1 TABLET BY MOUTH EVERY DAY 90 tablet 1   ondansetron (ZOFRAN) 8 MG tablet Take 1 tablet (8 mg total) by mouth every 8 (eight) hours as needed for nausea or vomiting. 20 tablet 0   pantoprazole (PROTONIX) 40 MG tablet TAKE 1 TABLET BY MOUTH TWICE A DAY 180 tablet 3   Propylene Glycol 0.6 % SOLN Apply 1 drop to eye 4 (four) times daily as needed for dry eyes (dry/irritated eyes).      tirzepatide (MOUNJARO) 15 MG/0.5ML Pen Inject 15 mg into the skin once a week. 6 mL 0   nitroGLYCERIN (NITROSTAT) 0.4 MG SL tablet Place 1 tablet (0.4 mg total) under the tongue every 5 (five) minutes as needed for chest pain. 25 tablet 3   No facility-administered medications prior to visit.    Allergies  Allergen Reactions   Contrast Media [Iodinated Contrast Media] Anaphylaxis   Metamucil [Psyllium] Anaphylaxis   Mustard Seed Anaphylaxis    Review of Systems  Constitutional:  Negative for fever and malaise/fatigue.  HENT:  Negative for congestion.   Eyes:  Negative for blurred vision.  Respiratory:  Negative for shortness of breath.   Cardiovascular:  Negative for chest pain, palpitations and leg swelling.  Gastrointestinal:  Negative for abdominal pain, blood in stool and nausea.  Genitourinary:  Negative for dysuria and frequency.  Musculoskeletal:  Negative for falls.  Skin:  Negative for rash.  Neurological:  Negative for dizziness, loss of consciousness and headaches.  Endo/Heme/Allergies:  Negative for environmental allergies.  Psychiatric/Behavioral:  Negative for depression. The patient is not nervous/anxious.        Objective:    Physical Exam Vitals and nursing note reviewed.  Constitutional:      General: She is not in acute distress.    Appearance: Normal appearance. She is not ill-appearing.  HENT:     Head: Normocephalic and atraumatic.     Right Ear: External ear normal.     Left Ear: External ear normal.  Eyes:     Extraocular Movements: Extraocular movements intact.  Pupils: Pupils are equal, round, and reactive to light.  Cardiovascular:     Rate and Rhythm: Normal rate and regular rhythm.     Heart sounds: Normal heart sounds. No murmur heard.    No gallop.  Pulmonary:     Effort: Pulmonary effort is normal. No respiratory distress.     Breath sounds: Normal breath sounds. No wheezing or rales.  Musculoskeletal:        General: Normal range  of motion.  Skin:    General: Skin is warm and dry.  Neurological:     General: No focal deficit present.     Mental Status: She is alert and oriented to person, place, and time.  Psychiatric:        Judgment: Judgment normal.     BP 110/70 (BP Location: Left Arm, Cuff Size: Normal)   Pulse 73   Temp 98.5 F (36.9 C) (Oral)   Resp 12   Ht  (1.549 m)   Wt 144 lb 9.6 oz (65.6 kg) Comment: w/ shoes  SpO2 95%   BMI 27.32 kg/m  Wt Readings from Last 3 Encounters:  05/08/22 144 lb 9.6 oz (65.6 kg)  04/29/22 139 lb (63 kg)  04/01/22 142 lb (64.4 kg)       Assessment & Plan:  Type 2 diabetes mellitus with diabetic peripheral angiopathy without gangrene, without long-term current use of insulin  Essential hypertension  Hyperlipidemia associated with type 2 diabetes mellitus  Uncontrolled type 2 diabetes mellitus with hyperglycemia Assessment & Plan: Lab Results  Component Value Date   HGBA1C 6.1 (H) 04/01/2022   hgba1c acceptable, minimize simple carbs. Increase exercise as tolerated. Continue current meds    Preventative health care Assessment & Plan: See AVS Ghm utd Check labs  Health Maintenance  Topic Date Due   COVID-19 Vaccine (6 - 2023-24 season) 05/24/2023 (Originally 09/20/2021)   OPHTHALMOLOGY EXAM  05/23/2022   INFLUENZA VACCINE  08/21/2022   MAMMOGRAM  09/21/2022   HEMOGLOBIN A1C  10/02/2022   DTaP/Tdap/Td (3 - Td or Tdap) 10/19/2022   Diabetic kidney evaluation - Urine ACR  11/05/2022   Diabetic kidney evaluation - eGFR measurement  04/01/2023   FOOT EXAM  05/08/2023   COLONOSCOPY (Pts 45-64yrs Insurance coverage will need to be confirmed)  09/10/2023   Hepatitis C Screening  Completed   HIV Screening  Completed   Zoster Vaccines- Shingrix  Completed   HPV VACCINES  Aged Out      Mixed dyslipidemia Assessment & Plan: Encourage heart healthy diet such as MIND or DASH diet, increase exercise, avoid trans fats, simple carbohydrates and  processed foods, consider a krill or fish or flaxseed oil cap daily.     Hypertension associated with type 2 diabetes mellitus Assessment & Plan: Well controlled, no changes to meds. Encouraged heart healthy diet such as the DASH diet and exercise as tolerated.     Morbid obesity Assessment & Plan: Doing well with HWW      I, [name], DO, personally preformed the services described in this documentation.  All medical record entries made by the scribe were at my direction and in my presence.  I have reviewed the chart and discharge instructions (if applicable) and agree that the record reflects my personal performance and is accurate and complete. 05/08/2022   I,Nicole Richard,acting as a scribe for [name], DO.,have documented all relevant documentation on the behalf of [name], DO,as directed by  Myrene Buddy  R Lowne Chase, DO while in the presence of [employer], DO.   [name], DO

## 2022-05-08 NOTE — Assessment & Plan Note (Signed)
See AVS Ghm utd Check labs  Health Maintenance  Topic Date Due   COVID-19 Vaccine (6 - 2023-24 season) 05/24/2023 (Originally 09/20/2021)   OPHTHALMOLOGY EXAM  05/23/2022   INFLUENZA VACCINE  08/21/2022   MAMMOGRAM  09/21/2022   HEMOGLOBIN A1C  10/02/2022   DTaP/Tdap/Td (3 - Td or Tdap) 10/19/2022   Diabetic kidney evaluation - Urine ACR  11/05/2022   Diabetic kidney evaluation - eGFR measurement  04/01/2023   FOOT EXAM  05/08/2023   COLONOSCOPY (Pts 45-70yrs Insurance coverage will need to be confirmed)  09/10/2023   Hepatitis C Screening  Completed   HIV Screening  Completed   Zoster Vaccines- Shingrix  Completed   HPV VACCINES  Aged Out

## 2022-05-08 NOTE — Assessment & Plan Note (Signed)
Encourage heart healthy diet such as MIND or DASH diet, increase exercise, avoid trans fats, simple carbohydrates and processed foods, consider a krill or fish or flaxseed oil cap daily.  °

## 2022-05-08 NOTE — Assessment & Plan Note (Signed)
Well controlled, no changes to meds. Encouraged heart healthy diet such as the DASH diet and exercise as tolerated.  °

## 2022-05-08 NOTE — Assessment & Plan Note (Signed)
Doing well with HWW

## 2022-05-22 LAB — HM DIABETES EYE EXAM

## 2022-05-27 ENCOUNTER — Ambulatory Visit: Payer: BC Managed Care – PPO | Admitting: Bariatrics

## 2022-06-01 ENCOUNTER — Other Ambulatory Visit: Payer: Self-pay | Admitting: Family Medicine

## 2022-06-01 DIAGNOSIS — I10 Essential (primary) hypertension: Secondary | ICD-10-CM

## 2022-06-02 ENCOUNTER — Encounter: Payer: Self-pay | Admitting: Bariatrics

## 2022-06-02 ENCOUNTER — Ambulatory Visit: Payer: BC Managed Care – PPO | Admitting: Bariatrics

## 2022-06-02 VITALS — BP 135/74 | HR 73 | Temp 98.4°F | Ht 61.0 in | Wt 141.0 lb

## 2022-06-02 DIAGNOSIS — E669 Obesity, unspecified: Secondary | ICD-10-CM | POA: Diagnosis not present

## 2022-06-02 DIAGNOSIS — E1151 Type 2 diabetes mellitus with diabetic peripheral angiopathy without gangrene: Secondary | ICD-10-CM

## 2022-06-02 DIAGNOSIS — Z7985 Long-term (current) use of injectable non-insulin antidiabetic drugs: Secondary | ICD-10-CM | POA: Diagnosis not present

## 2022-06-02 DIAGNOSIS — Z6826 Body mass index (BMI) 26.0-26.9, adult: Secondary | ICD-10-CM

## 2022-06-03 NOTE — Progress Notes (Unsigned)
Chief Complaint:   OBESITY Nicole Richard is here to discuss her progress with her obesity treatment plan along with follow-up of her obesity related diagnoses. Nicole Richard is on the Category 2 Plan and states she is following her eating plan approximately 50% of the time. Nicole Richard states she is doing 0 minutes 0 times per week.  Today's visit was #: 27 Starting weight: 163 lbs Starting date: 05/13/2020 Today's weight: 141 lbs Today's date: 06/02/2022 Total lbs lost to date: 22 Total lbs lost since last in-office visit: 0  Interim History: Nicole Richard is up 2 pounds since her last visit.  She has been stressed eating for several weeks.  Subjective:   1. Type 2 diabetes mellitus with diabetic peripheral angiopathy without gangrene, without long-term current use of insulin (HCC) Nicole Richard is taking Mounjaro (keeping blood sugar stable).   Assessment/Plan:   1. Type 2 diabetes mellitus with diabetic peripheral angiopathy without gangrene, without long-term current use of insulin (HCC) Nicole Richard will continue Mounjaro, and she will keep all carbohydrates low (sugar and starches).   2. Generalized obesity  3. BMI 26.0-26.9,adult Nicole Richard is currently in the action stage of change. As such, her goal is to continue with weight loss efforts. She has agreed to the Category 2 Plan and the Pescatarian Plan.   Meal planning and intentional eating was discussed.  Keep water intake up.  Exercise goals: No exercise has been prescribed at this time.  Behavioral modification strategies: increasing lean protein intake, decreasing simple carbohydrates, increasing vegetables, increasing water intake, decreasing eating out, no skipping meals, meal planning and cooking strategies, and keeping healthy foods in the home.  Nicole Richard has agreed to follow-up with our clinic in 4 weeks. She was informed of the importance of frequent follow-up visits to maximize her success with intensive lifestyle modifications for her multiple  health conditions.   Objective:   Blood pressure 135/74, pulse 73, temperature 98.4 F (36.9 C), height 5\' 1"  (1.549 m), weight 141 lb (64 kg), SpO2 96 %. Body mass index is 26.64 kg/m.  General: Cooperative, alert, well developed, in no acute distress. HEENT: Conjunctivae and lids unremarkable. Cardiovascular: Regular rhythm.  Lungs: Normal work of breathing. Neurologic: No focal deficits.   Lab Results  Component Value Date   CREATININE 0.84 04/01/2022   BUN 25 04/01/2022   NA 143 04/01/2022   K 4.5 04/01/2022   CL 106 04/01/2022   CO2 24 04/01/2022   Lab Results  Component Value Date   ALT 23 04/01/2022   AST 21 04/01/2022   ALKPHOS 80 04/01/2022   BILITOT 0.4 04/01/2022   Lab Results  Component Value Date   HGBA1C 6.1 (H) 04/01/2022   HGBA1C 6.4 11/04/2021   HGBA1C 6.4 (H) 07/09/2021   HGBA1C 6.7 (H) 04/08/2021   HGBA1C 6.6 (A) 03/18/2021   Lab Results  Component Value Date   INSULIN 21.8 04/01/2022   INSULIN 40.1 (H) 07/09/2021   INSULIN 41.3 (H) 04/08/2021   INSULIN 46.9 (H) 05/16/2020   Lab Results  Component Value Date   TSH 0.59 11/04/2021   Lab Results  Component Value Date   CHOL 140 04/01/2022   HDL 53 04/01/2022   LDLCALC 70 04/01/2022   LDLDIRECT 128.1 10/05/2013   TRIG 87 04/01/2022   CHOLHDL 3 11/04/2021   Lab Results  Component Value Date   VD25OH 35.9 04/01/2022   VD25OH 38.67 11/04/2021   VD25OH 71.4 07/09/2021   Lab Results  Component Value Date   WBC 6.6 11/04/2021  HGB 14.5 11/04/2021   HCT 43.0 11/04/2021   MCV 88.1 11/04/2021   PLT 297.0 11/04/2021   No results found for: "IRON", "TIBC", "FERRITIN"  Attestation Statements:   Reviewed by clinician on day of visit: allergies, medications, problem list, medical history, surgical history, family history, social history, and previous encounter notes.   Trude Mcburney, am acting as Energy manager for [employer], DO.  I have reviewed the above documentation for  accuracy and completeness, and I agree with the above. Corinna Capra, DO

## 2022-06-08 ENCOUNTER — Other Ambulatory Visit: Payer: Self-pay | Admitting: Family Medicine

## 2022-06-08 DIAGNOSIS — E1169 Type 2 diabetes mellitus with other specified complication: Secondary | ICD-10-CM

## 2022-06-08 DIAGNOSIS — J302 Other seasonal allergic rhinitis: Secondary | ICD-10-CM

## 2022-06-11 ENCOUNTER — Encounter: Payer: Self-pay | Admitting: Family Medicine

## 2022-06-12 ENCOUNTER — Other Ambulatory Visit: Payer: Self-pay | Admitting: Family Medicine

## 2022-06-12 DIAGNOSIS — D369 Benign neoplasm, unspecified site: Secondary | ICD-10-CM

## 2022-06-12 MED ORDER — BROMOCRIPTINE MESYLATE 2.5 MG PO TABS: 1.2500 mg | ORAL_TABLET | Freq: Every day | ORAL | 3 refills | Status: DC

## 2022-06-12 NOTE — Telephone Encounter (Signed)
Please advise on refill of requested medication

## 2022-07-07 ENCOUNTER — Ambulatory Visit: Payer: BC Managed Care – PPO | Admitting: Bariatrics

## 2022-07-07 ENCOUNTER — Encounter: Payer: Self-pay | Admitting: Bariatrics

## 2022-07-07 VITALS — BP 136/78 | HR 69 | Temp 98.0°F | Ht 61.0 in | Wt 141.0 lb

## 2022-07-07 DIAGNOSIS — E1151 Type 2 diabetes mellitus with diabetic peripheral angiopathy without gangrene: Secondary | ICD-10-CM

## 2022-07-07 DIAGNOSIS — Z6826 Body mass index (BMI) 26.0-26.9, adult: Secondary | ICD-10-CM

## 2022-07-07 DIAGNOSIS — E669 Obesity, unspecified: Secondary | ICD-10-CM

## 2022-07-07 DIAGNOSIS — Z7985 Long-term (current) use of injectable non-insulin antidiabetic drugs: Secondary | ICD-10-CM

## 2022-07-07 DIAGNOSIS — E782 Mixed hyperlipidemia: Secondary | ICD-10-CM

## 2022-07-07 MED ORDER — TIRZEPATIDE 15 MG/0.5ML ~~LOC~~ SOAJ: 15.0000 mg | SUBCUTANEOUS | 0 refills | Status: DC

## 2022-07-07 NOTE — Progress Notes (Signed)
WEIGHT SUMMARY AND BIOMETRICS  Weight Lost Since Last Visit: 0  Weight Gained Since Last Visit: 0   Vitals Temp: 98 F (36.7 C) BP: 136/78 Pulse Rate: 69 SpO2: 96 %   Anthropometric Measurements Height: 5\' 1"  (1.549 m) Weight: 141 lb (64 kg) BMI (Calculated): 26.66 Weight at Last Visit: 141lb Weight Lost Since Last Visit: 0 Weight Gained Since Last Visit: 0 Starting Weight: 163lb Total Weight Loss (lbs): 22 lb (9.979 kg)   Body Composition  Body Fat %: 34.1 % Fat Mass (lbs): 48.2 lbs Muscle Mass (lbs): 88.4 lbs Total Body Water (lbs): 62.6 lbs Visceral Fat Rating : 9   Other Clinical Data Fasting: no Labs: no Today's Visit #: 28 Starting Date: 05/13/20    OBESITY Pranisha is here to discuss her progress with her obesity treatment plan along with follow-up of her obesity related diagnoses.     Nutrition Plan: the Category 2 plan and the pescatarian plan - 70% adherence.  Current exercise: none  Interim History:  Her weight is the same.  Eating all of the food on the plan., Protein intake is as prescribed, Is skipping meals, and Water intake is adequate.  Pharmacotherapy: Roselene is on Mounjaro 15 mg SQ weekly Adverse side effects: None Hunger is moderately controlled.  Cravings are moderately controlled.   Assessment/Plan:   1. Type 2 diabetes mellitus with diabetic peripheral angiopathy without gangrene, without long-term current use of insulin (HCC) Type II Diabetes HgbA1c is at goal. Last A1c was 6.1 CBGs: Not checking      Episodes of hypoglycemia: no Medication(s): Mounjaro 15 mg SQ weekly  Lab Results  Component Value Date   HGBA1C 6.1 (H) 04/01/2022   HGBA1C 6.4 11/04/2021   HGBA1C 6.4 (H) 07/09/2021   Lab Results  Component Value Date   MICROALBUR 1.6 11/04/2021   LDLCALC 70 04/01/2022   CREATININE 0.84 04/01/2022   Lab Results  Component Value Date   GFR 92.11 11/04/2021   GFR 62.29 10/26/2020   GFR 85.17  03/20/2020    Plan: Continue and refill Mounjaro 15 mg SQ weekly Continue all other medications.  Will keep all carbohydrates low both sweets and starches.  Will continue exercise regimen to 30 to 60 minutes on most days of the week.  Aim for 7 to 9 hours of sleep nightly.  Eat more low glycemic index foods.    Hyperlipidemia LDL is not at goal. Medication(s): Fenofibrate and Lipitor  Cardiovascular risk factors: diabetes mellitus and dyslipidemia  Lab Results  Component Value Date   CHOL 140 04/01/2022   HDL 53 04/01/2022   LDLCALC 70 04/01/2022   LDLDIRECT 128.1 10/05/2013   TRIG 87 04/01/2022   CHOLHDL 3 11/04/2021   Lab Results  Component Value Date   ALT 23 04/01/2022   AST 21 04/01/2022   ALKPHOS 80 04/01/2022   BILITOT 0.4 04/01/2022   The 10-year ASCVD risk score (Arnett DK, et al., 2019) is: 11.8%   Values used to calculate the score:     Age: 50 years     Sex: Female     Is Non-Hispanic African American: No     Diabetic: Yes     Tobacco smoker: No     Systolic Blood Pressure: 136 mmHg     Is BP treated: Yes     HDL Cholesterol: 53 mg/dL     Total Cholesterol: 140 mg/dL  Plan:  Continue statin.  Information sheet on healthy vs unhealthy fats.  Will avoid  all trans fats.  Will read labels Will minimize saturated fats except the following: low fat meats in moderation, diary, and limited dark chocolate.  Increase Omega 3 in foods, and consider an Omega 3 supplement.      Generalized Obesity: Current BMI BMI (Calculated): 26.66   Pharmacotherapy Plan Continue and refill  Mounjaro 15 mg SQ weekly  Laila is currently in the action stage of change. As such, her goal is to continue with weight loss efforts.  She has agreed to the Category 2 plan and the pescatarian plan.  Exercise goals: All adults should avoid inactivity. Some physical activity is better than none, and adults who participate in any amount of physical activity gain some health  benefits.  Behavioral modification strategies: increasing lean protein intake, meal planning , better snacking choices, planning for success, and increasing vegetables.  Sushila has agreed to follow-up with our clinic in 4 weeks.       Objective:   VITALS: Per patient if applicable, see vitals. GENERAL: Alert and in no acute distress. CARDIOPULMONARY: No increased WOB. Speaking in clear sentences.  PSYCH: Pleasant and cooperative. Speech normal rate and rhythm. Affect is appropriate. Insight and judgement are appropriate. Attention is focused, linear, and appropriate.  NEURO: Oriented as arrived to appointment on time with no prompting.   Attestation Statements:    This was prepared with the assistance of Engineer, civil (consulting).  Occasional wrong-word or sound-a-like substitutions may have occurred due to the inherent limitations of voice recognition software.   Corinna Capra, DO

## 2022-08-06 ENCOUNTER — Ambulatory Visit: Payer: BC Managed Care – PPO | Admitting: Bariatrics

## 2022-08-25 ENCOUNTER — Other Ambulatory Visit: Payer: Self-pay | Admitting: Family Medicine

## 2022-08-25 DIAGNOSIS — Z1231 Encounter for screening mammogram for malignant neoplasm of breast: Secondary | ICD-10-CM

## 2022-09-15 ENCOUNTER — Other Ambulatory Visit: Payer: Self-pay | Admitting: Family Medicine

## 2022-09-15 DIAGNOSIS — I10 Essential (primary) hypertension: Secondary | ICD-10-CM

## 2022-09-16 ENCOUNTER — Ambulatory Visit: Payer: BC Managed Care – PPO | Admitting: Bariatrics

## 2022-09-19 ENCOUNTER — Encounter: Payer: Self-pay | Admitting: Family Medicine

## 2022-09-19 ENCOUNTER — Other Ambulatory Visit: Payer: Self-pay

## 2022-09-19 DIAGNOSIS — E1165 Type 2 diabetes mellitus with hyperglycemia: Secondary | ICD-10-CM

## 2022-09-19 MED ORDER — FREESTYLE LIBRE 14 DAY SENSOR MISC
1.0000 | 3 refills | Status: DC
Start: 2022-09-19 — End: 2023-09-18

## 2022-09-23 ENCOUNTER — Ambulatory Visit
Admission: RE | Admit: 2022-09-23 | Discharge: 2022-09-23 | Disposition: A | Discharge status: Home / Self Care | Payer: BC Managed Care – PPO | Source: Ambulatory Visit

## 2022-09-23 DIAGNOSIS — Z1231 Encounter for screening mammogram for malignant neoplasm of breast: Secondary | ICD-10-CM | POA: Diagnosis not present

## 2022-10-18 ENCOUNTER — Other Ambulatory Visit: Payer: Self-pay | Admitting: Family Medicine

## 2022-10-18 DIAGNOSIS — E1169 Type 2 diabetes mellitus with other specified complication: Secondary | ICD-10-CM

## 2022-10-28 ENCOUNTER — Ambulatory Visit: Payer: Medicare Other | Admitting: Bariatrics

## 2022-10-28 ENCOUNTER — Encounter: Payer: Self-pay | Admitting: Bariatrics

## 2022-10-28 VITALS — BP 138/85 | HR 69 | Temp 98.2°F | Ht 61.0 in | Wt 139.0 lb

## 2022-10-28 DIAGNOSIS — E1169 Type 2 diabetes mellitus with other specified complication: Secondary | ICD-10-CM | POA: Diagnosis not present

## 2022-10-28 DIAGNOSIS — I152 Hypertension secondary to endocrine disorders: Secondary | ICD-10-CM | POA: Diagnosis not present

## 2022-10-28 DIAGNOSIS — Z7985 Long-term (current) use of injectable non-insulin antidiabetic drugs: Secondary | ICD-10-CM

## 2022-10-28 DIAGNOSIS — E785 Hyperlipidemia, unspecified: Secondary | ICD-10-CM | POA: Diagnosis not present

## 2022-10-28 DIAGNOSIS — E559 Vitamin D deficiency, unspecified: Secondary | ICD-10-CM | POA: Diagnosis not present

## 2022-10-28 DIAGNOSIS — E1151 Type 2 diabetes mellitus with diabetic peripheral angiopathy without gangrene: Secondary | ICD-10-CM

## 2022-10-28 DIAGNOSIS — E669 Obesity, unspecified: Secondary | ICD-10-CM

## 2022-10-28 DIAGNOSIS — E6609 Other obesity due to excess calories: Secondary | ICD-10-CM

## 2022-10-28 DIAGNOSIS — Z6826 Body mass index (BMI) 26.0-26.9, adult: Secondary | ICD-10-CM

## 2022-10-28 MED ORDER — TIRZEPATIDE 15 MG/0.5ML ~~LOC~~ SOAJ: 15.0000 mg | SUBCUTANEOUS | 0 refills | Status: DC

## 2022-10-28 NOTE — Progress Notes (Addendum)
WEIGHT SUMMARY AND BIOMETRICS  Weight Lost Since Last Visit: 2lb  Weight Gained Since Last Visit: 0   Vitals Temp: 98.2 F (36.8 C) BP: 138/85 Pulse Rate: 69 SpO2: 97 %   Anthropometric Measurements Height: 5\' 1"  (1.549 m) Weight: 139 lb (63 kg) BMI (Calculated): 26.28 Weight at Last Visit: 141lb Weight Lost Since Last Visit: 2lb Weight Gained Since Last Visit: 0 Starting Weight: 163lb Total Weight Loss (lbs): 24 lb (10.9 kg)   Body Composition  Body Fat %: 36.3 % Fat Mass (lbs): 50.6 lbs Muscle Mass (lbs): 84.4 lbs Total Body Water (lbs): 62.4 lbs Visceral Fat Rating : 9   Other Clinical Data Fasting: yes Labs: no Today's Visit #: 76 Starting Date: 05/13/20    OBESITY Nicole Richard is here to discuss her progress with her obesity treatment plan along with follow-up of her obesity related diagnoses.     Nutrition Plan: the Category 2 plan and the pescatarian plan - 60 to 70% adherence.  Current exercise: walking  Interim History:  She is down 2 lbs since her last visit. Her portion size is smaller. She is more stressed at work.  Protein intake is as prescribed, Is not skipping meals, Water intake is adequate., and Denies polyphagia  Pharmacotherapy: Nicole Richard is on Mounjaro 15 mg SQ weekly Adverse side effects: None Hunger is moderately controlled.  Cravings are moderately controlled.  Assessment/Plan:   1. Type 2 diabetes mellitus with diabetic peripheral angiopathy without gangrene, without long-term current use of insulin (HCC) Type II Diabetes HgbA1c is at goal. Last A1c was 6.1 CBGs: Fasting 80 -120      Freestyle monitor Episodes of hypoglycemia: no Medication(s): Mounjaro 15 mg SQ weekly  Lab Results  Component Value Date   HGBA1C 6.1 (H) 04/01/2022   HGBA1C 6.4 11/04/2021   HGBA1C 6.4 (H) 07/09/2021   Lab Results  Component Value Date   MICROALBUR 1.6 11/04/2021   LDLCALC 70 04/01/2022   CREATININE 0.84 04/01/2022   Lab  Results  Component Value Date   GFR 92.11 11/04/2021   GFR 62.29 10/26/2020   GFR 85.17 03/20/2020    Plan: Continue and refill Mounjaro 15 mg SQ weekly She will continue to monitor her blood sugar.  Continue all other medications.  Will keep all carbohydrates low both sweets and starches.  Will continue exercise regimen to 30 to 60 minutes on most days of the week.  Aim for 7 to 9 hours of sleep nightly.  Eat more low glycemic index foods.   Vitamin D Deficiency Vitamin D is not at goal of 50.  Most recent vitamin D level was 35.9. She is on  prescription ergocalciferol 50,000 IU weekly. Lab Results  Component Value Date   VD25OH 35.9 04/01/2022   VD25OH 38.67 11/04/2021   VD25OH 71.4 07/09/2021    Plan: Continue prescription vitamin D 50,000 IU weekly.   HTN associated with DM type 2:   Hypertension stable.  Medication(s): Atenolol 25 mg,   BP Readings from Last 3 Encounters:  10/28/22 138/85  07/07/22 136/78  06/02/22 135/74   Lab Results  Component Value Date   CREATININE 0.84 04/01/2022   CREATININE 0.68 11/04/2021   CREATININE 0.92 07/09/2021   Lab Results  Component Value Date   GFR 92.11 11/04/2021   GFR 62.29 10/26/2020   GFR 85.17 03/20/2020    Plan: Continue all antihypertensives at current dosages. No added salt. Will keep sodium content to 1,500 mg or less per day.  Labs done today (CMP, Lipids, HgbA1c, insulin, vitamin D).    Generalized Obesity: Current BMI BMI (Calculated): 26.28   Pharmacotherapy Plan Continue and refill  Mounjaro 15 mg SQ weekly  Nicole Richard is currently in the action stage of change. As such, her goal is to continue with weight loss efforts.  She has agreed to the Category 2 plan.  Exercise goals: Older adults should determine their level of effort for physical activity relative to their level of fitness.   Behavioral modification strategies: decreasing simple carbohydrates , no meal skipping, meal planning , better  snacking choices, and keep healthy foods in the home.  Nicole Richard has agreed to follow-up with our clinic in 4 weeks.      Objective:   VITALS: Per patient if applicable, see vitals. GENERAL: Alert and in no acute distress. CARDIOPULMONARY: No increased WOB. Speaking in clear sentences.  PSYCH: Pleasant and cooperative. Speech normal rate and rhythm. Affect is appropriate. Insight and judgement are appropriate. Attention is focused, linear, and appropriate.  NEURO: Oriented as arrived to appointment on time with no prompting.   Attestation Statements:    This was prepared with the assistance of Engineer, civil (consulting).  Occasional wrong-word or sound-a-like substitutions may have occurred due to the inherent limitations of voice recognition software. Corinna Capra, DO

## 2022-10-29 LAB — COMPREHENSIVE METABOLIC PANEL
ALT: 37 [IU]/L — ABNORMAL HIGH (ref 0–32)
AST: 26 [IU]/L (ref 0–40)
Albumin: 4.4 g/dL (ref 3.9–4.9)
Alkaline Phosphatase: 114 [IU]/L (ref 44–121)
BUN/Creatinine Ratio: 26 (ref 12–28)
BUN: 21 mg/dL (ref 8–27)
Bilirubin Total: 0.3 mg/dL (ref 0.0–1.2)
CO2: 21 mmol/L (ref 20–29)
Calcium: 9.9 mg/dL (ref 8.7–10.3)
Chloride: 107 mmol/L — ABNORMAL HIGH (ref 96–106)
Creatinine, Ser: 0.8 mg/dL (ref 0.57–1.00)
Globulin, Total: 2.6 g/dL (ref 1.5–4.5)
Glucose: 112 mg/dL — ABNORMAL HIGH (ref 70–99)
Potassium: 5 mmol/L (ref 3.5–5.2)
Sodium: 145 mmol/L — ABNORMAL HIGH (ref 134–144)
Total Protein: 7 g/dL (ref 6.0–8.5)
eGFR: 82 mL/min/{1.73_m2} (ref >59)

## 2022-10-29 LAB — HEMOGLOBIN A1C
Est. average glucose Bld gHb Est-mCnc: 114 mg/dL
Hgb A1c MFr Bld: 5.6 % (ref 4.8–5.6)

## 2022-10-29 LAB — VITAMIN D 25 HYDROXY (VIT D DEFICIENCY, FRACTURES): Vit D, 25-Hydroxy: 26.5 ng/mL — ABNORMAL LOW (ref 30.0–100.0)

## 2022-10-29 LAB — INSULIN, RANDOM: INSULIN: 37.6 u[IU]/mL — ABNORMAL HIGH (ref 2.6–24.9)

## 2022-10-29 LAB — LIPID PANEL WITH LDL/HDL RATIO
Cholesterol, Total: 239 mg/dL — ABNORMAL HIGH (ref 100–199)
HDL: 63 mg/dL (ref >39)
LDL Chol Calc (NIH): 153 mg/dL — ABNORMAL HIGH (ref 0–99)
LDL/HDL Ratio: 2.4 {ratio} (ref 0.0–3.2)
Triglycerides: 131 mg/dL (ref 0–149)
VLDL Cholesterol Cal: 23 mg/dL (ref 5–40)

## 2022-11-20 ENCOUNTER — Encounter: Payer: Self-pay | Admitting: Family Medicine

## 2022-11-25 ENCOUNTER — Ambulatory Visit (INDEPENDENT_AMBULATORY_CARE_PROVIDER_SITE_OTHER): Payer: BC Managed Care – PPO | Admitting: Family Medicine

## 2022-11-25 ENCOUNTER — Encounter: Payer: Self-pay | Admitting: Family Medicine

## 2022-11-25 VITALS — BP 120/84 | HR 73 | Temp 98.1°F | Resp 18 | Ht 61.0 in | Wt 139.2 lb

## 2022-11-25 DIAGNOSIS — Z23 Encounter for immunization: Secondary | ICD-10-CM

## 2022-11-25 DIAGNOSIS — E785 Hyperlipidemia, unspecified: Secondary | ICD-10-CM | POA: Diagnosis not present

## 2022-11-25 DIAGNOSIS — E1169 Type 2 diabetes mellitus with other specified complication: Secondary | ICD-10-CM

## 2022-11-25 DIAGNOSIS — E559 Vitamin D deficiency, unspecified: Secondary | ICD-10-CM

## 2022-11-25 DIAGNOSIS — Z0000 Encounter for general adult medical examination without abnormal findings: Secondary | ICD-10-CM | POA: Diagnosis not present

## 2022-11-25 DIAGNOSIS — E1151 Type 2 diabetes mellitus with diabetic peripheral angiopathy without gangrene: Secondary | ICD-10-CM | POA: Diagnosis not present

## 2022-11-25 DIAGNOSIS — Z6830 Body mass index (BMI) 30.0-30.9, adult: Secondary | ICD-10-CM

## 2022-11-25 DIAGNOSIS — E1165 Type 2 diabetes mellitus with hyperglycemia: Secondary | ICD-10-CM

## 2022-11-25 DIAGNOSIS — I10 Essential (primary) hypertension: Secondary | ICD-10-CM | POA: Diagnosis not present

## 2022-11-25 DIAGNOSIS — E6609 Other obesity due to excess calories: Secondary | ICD-10-CM

## 2022-11-25 DIAGNOSIS — E2839 Other primary ovarian failure: Secondary | ICD-10-CM

## 2022-11-25 DIAGNOSIS — E66811 Obesity, class 1: Secondary | ICD-10-CM

## 2022-11-25 LAB — CBC WITH DIFFERENTIAL/PLATELET
Basophils Absolute: 0.1 10*3/uL (ref 0.0–0.1)
Basophils Relative: 0.9 % (ref 0.0–3.0)
Eosinophils Absolute: 0.6 10*3/uL (ref 0.0–0.7)
Eosinophils Relative: 8.2 % — ABNORMAL HIGH (ref 0.0–5.0)
HCT: 43.4 % (ref 36.0–46.0)
Hemoglobin: 14.3 g/dL (ref 12.0–15.0)
Lymphocytes Relative: 31.7 % (ref 12.0–46.0)
Lymphs Abs: 2.3 10*3/uL (ref 0.7–4.0)
MCHC: 32.9 g/dL (ref 30.0–36.0)
MCV: 90.1 fL (ref 78.0–100.0)
Monocytes Absolute: 0.4 10*3/uL (ref 0.1–1.0)
Monocytes Relative: 5.8 % (ref 3.0–12.0)
Neutro Abs: 3.8 10*3/uL (ref 1.4–7.7)
Neutrophils Relative %: 53.4 % (ref 43.0–77.0)
Platelets: 334 10*3/uL (ref 150.0–400.0)
RBC: 4.82 Mil/uL (ref 3.87–5.11)
RDW: 13 % (ref 11.5–15.5)
WBC: 7.1 10*3/uL (ref 4.0–10.5)

## 2022-11-25 LAB — MICROALBUMIN / CREATININE URINE RATIO
Creatinine,U: 129.9 mg/dL
Microalb Creat Ratio: 0.7 mg/g (ref 0.0–30.0)
Microalb, Ur: 1 mg/dL (ref 0.0–1.9)

## 2022-11-25 MED ORDER — VITAMIN D (ERGOCALCIFEROL) 1.25 MG (50000 UNIT) PO CAPS
50000.0000 [IU] | ORAL_CAPSULE | ORAL | 0 refills | Status: DC
Start: 2022-11-25 — End: 2023-02-23

## 2022-11-25 NOTE — Assessment & Plan Note (Signed)
Well controlled, no changes to meds. Encouraged heart healthy diet such as the DASH diet and exercise as tolerated.  °

## 2022-11-25 NOTE — Progress Notes (Signed)
Subjective:    Nicole Richard is a 67 y.o. female who presents for a Welcome to Medicare exam. Discussed the use of AI scribe software for clinical note transcription with the patient, who gave verbal consent to proceed.  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 She reports a family history of heart disease, hypothyroidism, stroke, kidney disease, diabetes, macular degeneration, and various types of cancer. The patient's own medical history includes diabetes and vitamin D deficiency. She reports that her diabetes has been more stable recently, with blood sugar levels typically ranging between 80 and 150. The patient also mentions experiencing headaches more frequently, which she speculates may be related to her vision. She has recently had her eyes checked and her prescription changed. The patient is due for several vaccinations and screenings, including a bone density test, which she has never had before. She also mentions that she is due for a tetanus shot and has recently received a flu vaccine.              Objective:    Today's Vitals   11/25/22 1050  BP: 120/84  Pulse: 73  Resp: 18  Temp: 98.1 F (36.7 C)  TempSrc: Oral  SpO2: 96%  Weight: 139 lb 3.2 oz (63.1 kg)  Height: 5\' 1"  (1.549 m)  Body mass index is 26.3 kg/m.  Medications Outpatient Encounter Medications as of 11/25/2022  Medication Sig   aspirin 81 MG chewable tablet Chew 81 mg by mouth at bedtime.   atenolol (TENORMIN) 25 MG tablet Take 1 tablet (25 mg total) by mouth daily.   atorvastatin (LIPITOR) 40 MG tablet Take 1 tablet (40 mg total) by mouth at bedtime.   bromocriptine (PARLODEL) 2.5 MG tablet Take 0.5 tablets (1.25 mg total) by mouth daily.   Calcium Carbonate-Vitamin D (OSCAL 500/200 D-3 PO) Take 1 tablet by mouth at bedtime.   Cholecalciferol (VITAMIN D-3) 25 MCG (1000 UT) CAPS Take 1 capsule by mouth daily.   Continuous Glucose Sensor  (FREESTYLE LIBRE 14 DAY SENSOR) MISC 1 each by Does not apply route every 14 (fourteen) days.   fenofibrate 160 MG tablet TAKE 1 TABLET BY MOUTH EVERY DAY   Ipratropium-Albuterol (COMBIVENT RESPIMAT) 20-100 MCG/ACT AERS respimat Inhale 1 puff into the lungs 4 (four) times daily as needed for wheezing or shortness of breath.   mometasone (NASONEX) 50 MCG/ACT nasal spray Place 2 sprays into the nose daily.   montelukast (SINGULAIR) 10 MG tablet TAKE 1 TABLET BY MOUTH EVERY DAY   pantoprazole (PROTONIX) 40 MG tablet TAKE 1 TABLET BY MOUTH TWICE A DAY   Propylene Glycol 0.6 % SOLN Apply 1 drop to eye 4 (four) times daily as needed for dry eyes (dry/irritated eyes).   tirzepatide (MOUNJARO) 15 MG/0.5ML Pen Inject 15 mg into the skin once a week.   Vitamin D, Ergocalciferol, (DRISDOL) 1.25 MG (50000 UNIT) CAPS capsule Take 1 capsule (50,000 Units total) by mouth every 7 (seven) days.   nitroGLYCERIN (NITROSTAT) 0.4 MG SL tablet Place 1 tablet (0.4 mg total) under the tongue every 5 (five) minutes as needed for chest pain.   [DISCONTINUED] CRANBERRY-VITAMIN C PO Take 2 capsules by mouth at bedtime.   [DISCONTINUED] Flaxseed, Linseed, ([employer] FLAXSEED OIL) 1000 MG CAPS Take 1 capsule by mouth daily.   [DISCONTINUED] ondansetron (ZOFRAN) 8 MG tablet Take 1 tablet (8 mg total) by mouth every 8 (eight) hours as needed for nausea or vomiting.  No facility-administered encounter medications on file as of 11/25/2022.     History: Past Medical History:  Diagnosis Date   Abdominal pain, left upper quadrant 03/01/2013   Abnormal liver function tests 03/01/2013   Allergy    seasonal   Angina at rest [facility]) 03/21/2020   Angina pectoris (HCC) 03/21/2020   Asthma    Back pain    BREAST IMPLANTS, BILATERAL, HX OF 07/28/2007   Qualifier: Diagnosis of  By: Janit Bern     CYSTOCELE WITH INCOMPLETE UTERINE PROLAPSE 05/18/2006   Qualifier: Diagnosis of  By: Janit Bern     Diabetes (HCC) 01/29/2014   Diabetes  mellitus without complication (HCC)    Edema of both lower extremities    ESOPHAGEAL STRICTURE 11/09/2009   Qualifier: Diagnosis of  By: Creta Levin CMA (AAMA), [name]     Essential hypertension 05/18/2006   Qualifier: Diagnosis of  By: Janit Bern     Extrinsic asthma with exacerbation, unspecified asthma severity 10/26/2020   GERD 09/14/2009   Qualifier: Diagnosis of  By: Janit Bern     GERD (gastroesophageal reflux disease)    HEADACHE 05/18/2006   Qualifier: Diagnosis of  By: Janit Bern     Hirsutism 05/18/2006   Qualifier: Diagnosis of  By: Janit Bern     Hyperlipidemia    Hyperlipidemia associated with type 2 diabetes mellitus (HCC) 04/15/2019   Hypertension    Kidney stones    LAPAROSCOPY, HX OF 05/18/2006   Qualifier: Diagnosis of  By: Janit Bern     Mixed dyslipidemia 06/22/2020   Nonspecific (abnormal) findings on radiological and other examination of biliary tract 03/01/2013   Obesity (BMI 30-39.9) 10/18/2012   Other chest pain 03/21/2020   Palpitation 03/21/2020   Palpitations    POSTMENOPAUSAL STATUS 09/14/2009   Qualifier: Diagnosis of  By: Janit Bern     Preventative health care 10/26/2020   Seasonal allergic rhinitis 10/26/2020   SKIN TAG 10/05/2009   Qualifier: Diagnosis of  By: Janit Bern     SOB (shortness of breath)    Uncontrolled type 2 diabetes mellitus with hyperglycemia (HCC) 04/15/2019   Vitamin D deficiency    Vitamin D insufficiency 05/21/2020   Past Surgical History:  Procedure Laterality Date   ABDOMINAL HYSTERECTOMY  08/2004   APPENDECTOMY     AUGMENTATION MAMMAPLASTY Bilateral 2000   BREAST ENHANCEMENT SURGERY  2001   COLONOSCOPY     LEFT HEART CATH AND CORONARY ANGIOGRAPHY N/A 04/09/2020   Procedure: LEFT HEART CATH AND CORONARY ANGIOGRAPHY;  Surgeon: Marykay Lex, MD;  Location: [facility] INVASIVE CV LAB;  Service: Cardiovascular;  Laterality: N/A;   POLYPECTOMY  2009   sigmoid polyps   SHOULDER ARTHROSCOPY W/ ROTATOR CUFF  REPAIR Right 06/06/2015   caffrey   TONSILLECTOMY     TUBAL LIGATION      Family History  Problem Relation Age of Onset   Cancer Mother    Rheumatic fever Mother    Irritable bowel syndrome Mother    Breast cancer Mother    Diabetes Mother    High blood pressure Mother    High Cholesterol Mother    Heart disease Mother    Stroke Mother    Sudden death Mother    Obesity Mother    Diabetes Father    Colon polyps Father    Kidney disease Father    High blood pressure Father    High Cholesterol Father    Heart  disease Father    Obesity Father    Macular degeneration Father    CAD Father    Hyperlipidemia Sister    Hypertension Sister    Stroke Sister    Heart disease Brother    Heart disease Brother    Hypothyroidism Brother    Cervical cancer Maternal Grandmother    Breast cancer Maternal Grandmother    Colon cancer Maternal Grandmother    Uterine cancer Maternal Grandmother    Coronary artery disease Other    Hyperlipidemia Other    Hypertension Other    Asthma Other    Esophageal cancer Neg Hx    Rectal cancer Neg Hx    Stomach cancer Neg Hx    Social History   Occupational History   Occupation: Nurse    Comment: [email or web address]  Tobacco Use   Smoking status: Never   Smokeless tobacco: Never  Vaping Use   Vaping status: Never Used  Substance and Sexual Activity   Alcohol use: Yes    Comment: rare   Drug use: No   Sexual activity: Not Currently    Partners: Male    Tobacco Counseling Counseling given: Not Answered   Immunizations and Health Maintenance Immunization History  Administered Date(s) Administered   Fluzone Influenza virus vaccine,trivalent (IIV3), split virus 11/03/2022   Influenza Whole 11/03/2006, 11/01/2008   Influenza,inj,Quad PF,6+ Mos 10/04/2015, 10/12/2017, 10/21/2019, 10/26/2020, 11/04/2021   Influenza-Unspecified 10/20/2012, 10/29/2016, 11/10/2018   Moderna Sars-Covid-2 Vaccination 02/15/2019, 03/15/2019, 12/12/2019, 06/20/2020    PNEUMOCOCCAL CONJUGATE-20 11/25/2022   Pfizer Covid-19 Vaccine Bivalent Booster 88yrs & up 10/15/2020   Pneumococcal Polysaccharide-23 11/03/2006, 10/02/2014, 04/20/2020   Td 11/29/2001   Tdap 10/18/2012   Zoster Recombinant(Shingrix) 10/12/2017, 12/15/2017   Zoster, Live 10/05/2009   Health Maintenance Due  Topic Date Due   DEXA SCAN  07/16/2022   COVID-19 Vaccine (6 - 2023-24 season) 09/21/2022   DTaP/Tdap/Td (3 - Td or Tdap) 10/19/2022   Diabetic kidney evaluation - Urine ACR  11/05/2022    Activities of Daily Living    11/25/2022   11:38 AM  In your present state of health, do you have any difficulty performing the following activities:  Hearing? 0  Vision? 0  Difficulty concentrating or making decisions? 0  Walking or climbing stairs? 0  Dressing or bathing? 0  Doing errands, shopping? 0    Physical Exam   Physical Exam Vitals and nursing note reviewed.  Constitutional:      General: She is not in acute distress.    Appearance: Normal appearance. She is well-developed.  HENT:     Head: Normocephalic and atraumatic.     Right Ear: Tympanic membrane, ear canal and external ear normal. There is no impacted cerumen.     Left Ear: Tympanic membrane, ear canal and external ear normal. There is no impacted cerumen.     Nose: Nose normal.     Mouth/Throat:     Mouth: Mucous membranes are moist.     Pharynx: Oropharynx is clear. No oropharyngeal exudate or posterior oropharyngeal erythema.  Eyes:     General: No scleral icterus.       Right eye: No discharge.        Left eye: No discharge.     Conjunctiva/sclera: Conjunctivae normal.     Pupils: Pupils are equal, round, and reactive to light.  Neck:     Thyroid: No thyromegaly or thyroid tenderness.     Vascular: No JVD.  Cardiovascular:     Rate and Rhythm:  Normal rate and regular rhythm.     Heart sounds: Normal heart sounds. No murmur heard. Pulmonary:     Effort: Pulmonary effort is normal. No respiratory  distress.     Breath sounds: Normal breath sounds.  Abdominal:     General: Bowel sounds are normal. There is no distension.     Palpations: Abdomen is soft. There is no mass.     Tenderness: There is no abdominal tenderness. There is no guarding or rebound.  Genitourinary:    Vagina: Normal.  Musculoskeletal:        General: Normal range of motion.     Cervical back: Normal range of motion and neck supple.     Right lower leg: No edema.     Left lower leg: No edema.  Lymphadenopathy:     Cervical: No cervical adenopathy.  Skin:    General: Skin is warm and dry.     Findings: No erythema or rash.  Neurological:     Mental Status: She is alert and oriented to person, place, and time.     Cranial Nerves: No cranial nerve deficit.     Deep Tendon Reflexes: Reflexes are normal and symmetric.  Psychiatric:        Mood and Affect: Mood normal.        Behavior: Behavior normal.        Thought Content: Thought content normal.        Judgment: Judgment normal.    (optional), or other factors deemed appropriate based on the beneficiary's medical and social history and current clinical standards.   Advanced Directives:none on file--- will mail pa info    EKG:  normal EKG, normal sinus rhythm, normal sinus rhythm      Assessment:    This is a routine wellness examination for this patient .   Vision/Hearing screen Vision Screening   Right eye Left eye Both eyes  Without correction     With correction   20/20  Hearing Screening - Comments:: Normal whisper    Goals      LDL CALC < 100        Depression Screen    11/25/2022   11:35 AM 11/25/2022   10:52 AM 11/04/2021    9:06 AM 02/12/2021    1:50 PM  PHQ 2/9 Scores  PHQ - 2 Score 0 0 0 0     Fall Risk    11/25/2022   10:52 AM  Fall Risk   Falls in the past year? 0  Number falls in past yr: 0  Injury with Fall? 0  Follow up Falls evaluation completed    Cognitive Function:    11/25/2022   11:39 AM  MMSE -  Mini Mental State Exam  Orientation to time 5  Orientation to Place 5  Registration 3  Attention/ Calculation 5  Recall 3  Language- name 2 objects 2  Language- repeat 1  Language- follow 3 step command 3  Language- read & follow direction 1  Write a sentence 1  Copy design 1  Total score 30        Patient Care Team: [name], DO as PCP - General Lynann Bologna, MD as Consulting Physician (Gastroenterology) Roswell Nickel, DO as Consulting Physician [facility])     Plan:   Assessment and Plan    Diabetes Mellitus Stable with blood sugars ranging between 80-150. Recent labs showed high insulin levels and liver function slightly off. -Continue current management. -Check microalbuminuria.  Vitamin D Deficiency Recent labs showed low Vitamin D levels despite over-the-counter supplementation. -Increase over-the-counter Vitamin D3 to 2000 IU daily. -Prescribe Vitamin D2 once a week for 3 months.  Family History of Breast Cancer Mother and grandmother had breast cancer. Mother diagnosed before age 1. Grandmother had bilateral mastectomy. -Order bone density test. -Discussed potential future genetic testing for breast cancer prevention.  General Health Maintenance -Administer Pneumonia vaccine today. -Recommend RSV vaccine at pharmacy. -Due for Tetanus vaccine. -Continue regular dental visits. -Continue regular mammograms. Last one in September 2024. -PPD for TB recently done and normal.        I have personally reviewed and noted the following in the patient's chart:   Medical and social history Use of alcohol, tobacco or illicit drugs  Current medications and supplements Functional ability and status Nutritional status Physical activity Advanced directives List of other physicians Hospitalizations, surgeries, and ER visits in previous 12 months Vitals Screenings to include cognitive, depression, and falls Referrals and appointments  In  addition, I have reviewed and discussed with patient certain preventive protocols, quality metrics, and best practice recommendations. A written personalized care plan for preventive services as well as general preventive health recommendations were provided to patient.     Lelon Perla Chase, DO 11/25/2022

## 2022-11-25 NOTE — Assessment & Plan Note (Signed)
Tolerating statin, encouraged heart healthy diet, avoid trans fats, minimize simple carbs and saturated fats. Increase exercise as tolerated 

## 2022-11-25 NOTE — Assessment & Plan Note (Signed)
hgba1c acceptable, minimize simple carbs. Increase exercise as tolerated. Continue current meds  Lab Results  Component Value Date   HGBA1C 5.6 10/28/2022

## 2022-11-26 LAB — COMPREHENSIVE METABOLIC PANEL
AG Ratio: 1.6 (calc) (ref 1.0–2.5)
ALT: 17 U/L (ref 6–29)
AST: 16 U/L (ref 10–35)
Albumin: 4.6 g/dL (ref 3.6–5.1)
Alkaline phosphatase (APISO): 82 U/L (ref 37–153)
BUN: 21 mg/dL (ref 7–25)
CO2: 25 mmol/L (ref 20–32)
Calcium: 10.3 mg/dL (ref 8.6–10.4)
Chloride: 104 mmol/L (ref 98–110)
Creat: 0.85 mg/dL (ref 0.50–1.05)
Globulin: 2.8 g/dL (ref 1.9–3.7)
Glucose, Bld: 83 mg/dL (ref 65–99)
Potassium: 4.4 mmol/L (ref 3.5–5.3)
Sodium: 140 mmol/L (ref 135–146)
Total Bilirubin: 0.9 mg/dL (ref 0.2–1.2)
Total Protein: 7.4 g/dL (ref 6.1–8.1)

## 2022-11-26 LAB — INSULIN, RANDOM: Insulin: 10.6 u[IU]/mL

## 2022-11-26 LAB — LIPID PANEL
Cholesterol: 167 mg/dL (ref <200)
HDL: 65 mg/dL (ref >=50)
LDL Cholesterol (Calc): 87 mg/dL
Non-HDL Cholesterol (Calc): 102 mg/dL (ref <130)
Total CHOL/HDL Ratio: 2.6 (calc) (ref <5.0)
Triglycerides: 67 mg/dL (ref <150)

## 2022-11-27 ENCOUNTER — Ambulatory Visit (INDEPENDENT_AMBULATORY_CARE_PROVIDER_SITE_OTHER): Payer: Medicare Other | Admitting: Bariatrics

## 2022-11-27 ENCOUNTER — Encounter: Payer: Self-pay | Admitting: Bariatrics

## 2022-11-27 VITALS — BP 136/87 | HR 77 | Temp 97.9°F | Ht 61.0 in | Wt 134.0 lb

## 2022-11-27 DIAGNOSIS — E669 Obesity, unspecified: Secondary | ICD-10-CM | POA: Diagnosis not present

## 2022-11-27 DIAGNOSIS — Z7985 Long-term (current) use of injectable non-insulin antidiabetic drugs: Secondary | ICD-10-CM

## 2022-11-27 DIAGNOSIS — Z6825 Body mass index (BMI) 25.0-25.9, adult: Secondary | ICD-10-CM

## 2022-11-27 DIAGNOSIS — E1165 Type 2 diabetes mellitus with hyperglycemia: Secondary | ICD-10-CM

## 2022-11-27 NOTE — Progress Notes (Signed)
WEIGHT SUMMARY AND BIOMETRICS  Weight Lost Since Last Visit: 5lb  Weight Gained Since Last Visit: 0   Vitals Temp: 97.9 F (36.6 C) BP: 136/87 Pulse Rate: 77 SpO2: 96 %   Anthropometric Measurements Height: 5\' 1"  (1.549 m) Weight: 134 lb (60.8 kg) BMI (Calculated): 25.33 Weight at Last Visit: 139lb Weight Lost Since Last Visit: 5lb Weight Gained Since Last Visit: 0 Starting Weight: 163lb Total Weight Loss (lbs): 29 lb (13.2 kg)   Body Composition  Body Fat %: 35.6 % Fat Mass (lbs): 47.8 lbs Muscle Mass (lbs): 82.2 lbs Total Body Water (lbs): 59.2 lbs Visceral Fat Rating : 9   Other Clinical Data Fasting: no Labs: no Today's Visit #: 30 Starting Date: 05/13/20    OBESITY Nicole Richard is here to discuss her progress with her obesity treatment plan along with follow-up of her obesity related diagnoses.    Nutrition Plan: the Category 2 plan - 80% adherence.  Current exercise: walking  Interim History:  She is down 5 lbs since her visit.  Eating all of the food on the plan., Protein intake is as prescribed, Is not skipping meals, and Water intake is adequate.  Pharmacotherapy: Nicole Richard is on Mounjaro 15 mg SQ weekly Adverse side effects: None Hunger is moderately controlled.  Cravings are moderately controlled.  Assessment/Plan:   Type II Diabetes HgbA1c is at goal. Last A1c was 5.6 CBGs: Not checking      Episodes of hypoglycemia: no Medication(s): Mounjaro 12.5 mg SQ weekly  Lab Results  Component Value Date   HGBA1C 5.6 10/28/2022   HGBA1C 6.1 (H) 04/01/2022   HGBA1C 6.4 11/04/2021   Lab Results  Component Value Date   MICROALBUR 1.0 11/25/2022   LDLCALC 87 11/25/2022   CREATININE 0.85 11/25/2022   Lab Results  Component Value Date   GFR 92.11 11/04/2021   GFR 62.29 10/26/2020   GFR 85.17 03/20/2020    Plan: Continue Mounjaro  15 mg SQ weekly Will keep all carbohydrates low both sweets and starches.  Will continue exercise regimen to 30 to 60 minutes on most days of the week.  Aim for 7 to 9 hours of sleep nightly.  Eat more low glycemic index foods.  Will eat all protein first.   Generalized Obesity: Current BMI BMI (Calculated): 25.33   Pharmacotherapy Plan Continue  Mounjaro 15 mg SQ weekly  Nicole Richard is currently in the action stage of change. As such, her goal is to continue with weight loss efforts.  She has agreed to the Category 2 plan.  Exercise goals: Older adults should determine their level of effort for physical activity relative to their level of fitness.   Behavioral modification strategies: increasing lean protein intake, no meal skipping, increase water intake, better snacking choices, planning for success, avoiding temptations, and mindful eating.  Nicole Richard has agreed to follow-up with our clinic in 4  weeks.      Objective:   VITALS: Per patient if applicable, see vitals. GENERAL: Alert and in no acute distress. CARDIOPULMONARY: No increased WOB. Speaking in clear sentences.  PSYCH: Pleasant and cooperative. Speech normal rate and rhythm. Affect is appropriate. Insight and judgement are appropriate. Attention is focused, linear, and appropriate.  NEURO: Oriented as arrived to appointment on time with no prompting.   Attestation Statements:   This was prepared with the assistance of Engineer, civil (consulting).  Occasional wrong-word or sound-a-like substitutions may have occurred due to the inherent limitations of voice recognition   Corinna Capra, DO

## 2022-12-22 ENCOUNTER — Ambulatory Visit (HOSPITAL_BASED_OUTPATIENT_CLINIC_OR_DEPARTMENT_OTHER)
Admission: RE | Admit: 2022-12-22 | Discharge: 2022-12-22 | Disposition: A | Discharge status: Home / Self Care | Payer: Medicare Other | Source: Ambulatory Visit | Attending: Family Medicine | Admitting: Family Medicine

## 2022-12-22 DIAGNOSIS — E2839 Other primary ovarian failure: Secondary | ICD-10-CM | POA: Insufficient documentation

## 2022-12-26 ENCOUNTER — Other Ambulatory Visit: Payer: Self-pay

## 2022-12-26 MED ORDER — ALENDRONATE SODIUM 70 MG PO TABS: 70.0000 mg | ORAL_TABLET | ORAL | 3 refills | Status: DC

## 2022-12-29 ENCOUNTER — Ambulatory Visit (INDEPENDENT_AMBULATORY_CARE_PROVIDER_SITE_OTHER): Payer: Medicare Other | Admitting: Bariatrics

## 2022-12-29 ENCOUNTER — Encounter: Payer: Self-pay | Admitting: Bariatrics

## 2022-12-29 VITALS — BP 129/71 | HR 75 | Temp 97.7°F | Ht 61.0 in | Wt 132.0 lb

## 2022-12-29 DIAGNOSIS — E1151 Type 2 diabetes mellitus with diabetic peripheral angiopathy without gangrene: Secondary | ICD-10-CM | POA: Diagnosis not present

## 2022-12-29 DIAGNOSIS — Z7985 Long-term (current) use of injectable non-insulin antidiabetic drugs: Secondary | ICD-10-CM

## 2022-12-29 DIAGNOSIS — Z6824 Body mass index (BMI) 24.0-24.9, adult: Secondary | ICD-10-CM | POA: Diagnosis not present

## 2022-12-29 DIAGNOSIS — E669 Obesity, unspecified: Secondary | ICD-10-CM | POA: Diagnosis not present

## 2022-12-29 DIAGNOSIS — I10 Essential (primary) hypertension: Secondary | ICD-10-CM

## 2022-12-29 MED ORDER — TIRZEPATIDE 15 MG/0.5ML ~~LOC~~ SOAJ: 15.0000 mg | SUBCUTANEOUS | 0 refills | Status: DC

## 2022-12-29 NOTE — Progress Notes (Signed)
WEIGHT SUMMARY AND BIOMETRICS  Weight Lost Since Last Visit: 2lb  Weight Gained Since Last Visit: 0   Vitals Temp: 97.7 F (36.5 C) BP: 129/71 Pulse Rate: 75 SpO2: 98 %   Anthropometric Measurements Height: 5\' 1"  (1.549 m) Weight: 132 lb (59.9 kg) BMI (Calculated): 24.95 Weight at Last Visit: 134lb Weight Lost Since Last Visit: 2lb Weight Gained Since Last Visit: 0 Starting Weight: 163lb Total Weight Loss (lbs): 31 lb (14.1 kg)   Body Composition  Body Fat %: 35.5 % Fat Mass (lbs): 47.2 lbs Muscle Mass (lbs): 81.2 lbs Total Body Water (lbs): 59 lbs Visceral Fat Rating : 8   Other Clinical Data Fasting: no Labs: no Today's Visit #: 31 Starting Date: 05/13/20    OBESITY Nicole Richard is here to discuss her progress with her obesity treatment plan along with follow-up of her obesity related diagnoses.    Nutrition Plan: the Category 2 plan - 80% adherence.  Current exercise: walking  Interim History:  She is down 2 lbs since her last visit. Her last visit was before the Thanksgiving holiday.  Eating all of the food on the plan., Protein intake is as prescribed, Is not skipping meals, Not journaling consistently., and Water intake is adequate.   Pharmacotherapy: Nicole Richard is on Mounjaro 15 mg SQ weekly Adverse side effects: None Hunger is well controlled.  Cravings are moderately controlled.  Assessment/Plan:   1. Type 2 diabetes mellitus with diabetic peripheral angiopathy without gangrene, without long-term current use of insulin (HCC)  HgbA1c is at goal. Last A1c was 5.6 CBGs: Fasting 80's and 140's postprandial       Episodes of hypoglycemia: yes - occasionally, but well controlled.  Medication(s): Mounjaro 15 mg SQ weekly  Lab Results  Component Value Date   HGBA1C 5.6 10/28/2022   HGBA1C 6.1 (H) 04/01/2022   HGBA1C 6.4 11/04/2021   Lab  Results  Component Value Date   MICROALBUR 1.0 11/25/2022   LDLCALC 87 11/25/2022   CREATININE 0.85 11/25/2022   Lab Results  Component Value Date   GFR 92.11 11/04/2021   GFR 62.29 10/26/2020   GFR 85.17 03/20/2020    Plan: Continue and refill Mounjaro 15 mg SQ weekly Continue all other medications.  Will keep all carbohydrates low both sweets and starches.  Will continue exercise regimen to 30 to 60 minutes on most days of the week.  Aim for 7 to 9 hours of sleep nightly.  Eat more low glycemic index foods.  She will not go without food too long.    Hypertension Hypertension well controlled.  Medication(s): Atenolol 25 mg  BP Readings from Last 3 Encounters:  12/29/22 129/71  11/27/22 136/87  11/25/22 120/84   Lab Results  Component Value Date   CREATININE 0.85 11/25/2022   CREATININE 0.80 10/28/2022   CREATININE 0.84 04/01/2022   Lab Results  Component Value  Date   GFR 92.11 11/04/2021   GFR 62.29 10/26/2020   GFR 85.17 03/20/2020    Plan: Continue all antihypertensives at current dosages. No added salt. Will keep sodium content to 1,500 mg or less per day.     Generalized Obesity: Current BMI BMI (Calculated): 24.95   Pharmacotherapy Plan Continue and refill  Mounjaro 15 mg SQ weekly  Nicole Richard is currently in the action stage of change. As such, her goal is to maintain weight for now.  She has agreed to the Category 2 plan.  Exercise goals: Older adults should do exercises that maintain or improve balance if they are at risk of falling.   Behavioral modification strategies: increasing lean protein intake, no meal skipping, meal planning , planning for success, increasing vegetables, avoiding temptations, and mindful eating.  Nicole Richard has agreed to follow-up with our clinic in 4 weeks.      Objective:   VITALS: Per patient if applicable, see vitals. GENERAL: Alert and in no acute distress. CARDIOPULMONARY: No increased WOB. Speaking in clear  sentences.  PSYCH: Pleasant and cooperative. Speech normal rate and rhythm. Affect is appropriate. Insight and judgement are appropriate. Attention is focused, linear, and appropriate.  NEURO: Oriented as arrived to appointment on time with no prompting.   Attestation Statements:    This was prepared with the assistance of Engineer, civil (consulting).  Occasional wrong-word or sound-a-like substitutions may have occurred due to the inherent limitations of voice recognition   Corinna Capra, DO

## 2023-01-10 ENCOUNTER — Other Ambulatory Visit: Payer: Self-pay | Admitting: Family Medicine

## 2023-01-10 DIAGNOSIS — E1169 Type 2 diabetes mellitus with other specified complication: Secondary | ICD-10-CM

## 2023-01-11 ENCOUNTER — Other Ambulatory Visit: Payer: Self-pay | Admitting: Family Medicine

## 2023-01-11 DIAGNOSIS — K219 Gastro-esophageal reflux disease without esophagitis: Secondary | ICD-10-CM

## 2023-01-11 DIAGNOSIS — J302 Other seasonal allergic rhinitis: Secondary | ICD-10-CM

## 2023-01-27 ENCOUNTER — Ambulatory Visit (INDEPENDENT_AMBULATORY_CARE_PROVIDER_SITE_OTHER): Payer: Medicare Other | Admitting: Bariatrics

## 2023-01-27 ENCOUNTER — Encounter: Payer: Self-pay | Admitting: Bariatrics

## 2023-01-27 VITALS — BP 137/74 | HR 70 | Temp 97.8°F | Ht 61.0 in | Wt 132.0 lb

## 2023-01-27 DIAGNOSIS — E669 Obesity, unspecified: Secondary | ICD-10-CM | POA: Diagnosis not present

## 2023-01-27 DIAGNOSIS — Z7985 Long-term (current) use of injectable non-insulin antidiabetic drugs: Secondary | ICD-10-CM

## 2023-01-27 DIAGNOSIS — E1151 Type 2 diabetes mellitus with diabetic peripheral angiopathy without gangrene: Secondary | ICD-10-CM

## 2023-01-27 DIAGNOSIS — Z6824 Body mass index (BMI) 24.0-24.9, adult: Secondary | ICD-10-CM

## 2023-01-27 NOTE — Progress Notes (Signed)
 WEIGHT SUMMARY AND BIOMETRICS  Weight Lost Since Last Visit: 0  Weight Gained Since Last Visit: 0   Vitals Temp: 97.8 F (36.6 C) BP: 137/74 Pulse Rate: 70 SpO2: 98 %   Anthropometric Measurements Height: 5' 1 (1.549 m) Weight: 132 lb (59.9 kg) BMI (Calculated): 24.95 Weight at Last Visit: 132lb Weight Lost Since Last Visit: 0 Weight Gained Since Last Visit: 0 Starting Weight: 163lb Total Weight Loss (lbs): 31 lb (14.1 kg)   Body Composition  Body Fat %: 36 % Fat Mass (lbs): 47.6 lbs Muscle Mass (lbs): 80.2 lbs Total Body Water (lbs): 59 lbs Visceral Fat Rating : 9   Other Clinical Data Fasting: no Labs: no Today's Visit #: 32 Starting Date: 05/13/20    OBESITY Nicole Richard is here to discuss her progress with her obesity treatment plan along with follow-up of her obesity related diagnoses.    Nutrition Plan: the Category 2 plan - 50% adherence.  Current exercise: none  Interim History:  Her weight remains the same. She was able to control her portions.  Eating all of the food on the plan., Protein intake is as prescribed, Is not skipping meals, Water intake is adequate., and Denies polyphagia   Pharmacotherapy: Nicole Richard is on Mounjaro  15 mg SQ weekly Adverse side effects: None Hunger is moderately controlled.  Cravings are moderately controlled.   Assessment/Plan:   Type II Diabetes HgbA1c is at goal. Last A1c was 5.6 Episodes of hypoglycemia: no Medication(s): Mounjaro  15 mg SQ weekly  Lab Results  Component Value Date   HGBA1C 5.6 10/28/2022   HGBA1C 6.1 (H) 04/01/2022   HGBA1C 6.4 11/04/2021   Lab Results  Component Value Date   MICROALBUR 1.0 11/25/2022   LDLCALC 87 11/25/2022   CREATININE 0.85 11/25/2022   Lab Results  Component Value Date   GFR 92.11 11/04/2021   GFR 62.29 10/26/2020   GFR 85.17 03/20/2020     Plan: Continue Mounjaro  15 mg SQ weekly Will keep all carbohydrates low both sweets and starches.  Will continue exercise regimen to 30 to 60 minutes on most days of the week.  Aim for 7 to 9 hours of sleep nightly.  Eat more low glycemic index foods.  She will continue to walk and will consider getting herself a weighted vest.   Generalized Obesity: Current BMI BMI (Calculated): 24.95   Pharmacotherapy Plan Continue  Mounjaro  15 mg SQ weekly  Nicole Richard is currently in the action stage of change. As such, her goal is to continue with weight loss efforts.  She has agreed to the Category 2 plan.  Exercise goals: Older adults should determine their level of effort for physical activity relative to their level of fitness.  She is walking and will add in some weights.   Behavioral modification strategies: increasing lean protein intake, meal planning , increase water intake, better snacking choices, planning  for success, increasing vegetables, and mindful eating.  Nicole Richard has agreed to follow-up with our clinic in 4 weeks.      Objective:   VITALS: Per patient if applicable, see vitals. GENERAL: Alert and in no acute distress. CARDIOPULMONARY: No increased WOB. Speaking in clear sentences.  PSYCH: Pleasant and cooperative. Speech normal rate and rhythm. Affect is appropriate. Insight and judgement are appropriate. Attention is focused, linear, and appropriate.  NEURO: Oriented as arrived to appointment on time with no prompting.   Attestation Statements:    This was prepared with the assistance of Engineer, Civil (consulting).  Occasional wrong-word or sound-a-like substitutions may have occurred due to the inherent limitations of voice recognition   Clayborne Daring, DO

## 2023-02-21 ENCOUNTER — Other Ambulatory Visit: Payer: Self-pay | Admitting: Family Medicine

## 2023-02-21 DIAGNOSIS — E559 Vitamin D deficiency, unspecified: Secondary | ICD-10-CM

## 2023-03-01 ENCOUNTER — Other Ambulatory Visit: Payer: Self-pay | Admitting: Cardiology

## 2023-03-02 ENCOUNTER — Ambulatory Visit: Payer: PRIVATE HEALTH INSURANCE | Admitting: Bariatrics

## 2023-03-02 MED ORDER — NITROGLYCERIN 0.4 MG SL SUBL: 0.4000 mg | SUBLINGUAL_TABLET | SUBLINGUAL | 0 refills | Status: AC | PRN

## 2023-03-05 ENCOUNTER — Ambulatory Visit (INDEPENDENT_AMBULATORY_CARE_PROVIDER_SITE_OTHER): Payer: Medicare Other | Admitting: Bariatrics

## 2023-03-05 ENCOUNTER — Encounter: Payer: Self-pay | Admitting: Bariatrics

## 2023-03-05 VITALS — BP 152/75 | HR 63 | Temp 97.8°F | Ht 61.0 in | Wt 138.0 lb

## 2023-03-05 DIAGNOSIS — E119 Type 2 diabetes mellitus without complications: Secondary | ICD-10-CM

## 2023-03-05 DIAGNOSIS — I10 Essential (primary) hypertension: Secondary | ICD-10-CM

## 2023-03-05 DIAGNOSIS — Z6826 Body mass index (BMI) 26.0-26.9, adult: Secondary | ICD-10-CM | POA: Diagnosis not present

## 2023-03-05 DIAGNOSIS — E669 Obesity, unspecified: Secondary | ICD-10-CM

## 2023-03-05 DIAGNOSIS — E1151 Type 2 diabetes mellitus with diabetic peripheral angiopathy without gangrene: Secondary | ICD-10-CM

## 2023-03-05 DIAGNOSIS — Z7985 Long-term (current) use of injectable non-insulin antidiabetic drugs: Secondary | ICD-10-CM

## 2023-03-05 NOTE — Progress Notes (Signed)
WEIGHT SUMMARY AND BIOMETRICS  Weight Lost Since Last Visit: 0  Weight Gained Since Last Visit: 6lb   Vitals Temp: 97.8 F (36.6 C) BP: (!) 152/75 Pulse Rate: 63 SpO2: 97 %   Anthropometric Measurements Height: 5\' 1"  (1.549 m) Weight: 138 lb (62.6 kg) BMI (Calculated): 26.09 Weight at Last Visit: 132lb Weight Lost Since Last Visit: 0 Weight Gained Since Last Visit: 6lb Starting Weight: 163lb Total Weight Loss (lbs): 25 lb (11.3 kg)   Body Composition  Body Fat %: 37.7 % Fat Mass (lbs): 52 lbs Muscle Mass (lbs): 81.6 lbs Total Body Water (lbs): 62 lbs Visceral Fat Rating : 9   Other Clinical Data Fasting: no Labs: no Today's Visit #: 60 Starting Date: 05/13/20    OBESITY Jazmeen is here to discuss her progress with her obesity treatment plan along with follow-up of her obesity related diagnoses.    Nutrition Plan: the Category 2 plan - 80% adherence.  Current exercise: none  Interim History:   She is up 6 lbs since her last visit. She is up 3 lbs of water and 1.4 lbs of muscle.  Eating all of the food on the plan., Protein intake is as prescribed, Is not skipping meals, and Reports polyphagia   Pharmacotherapy: Lyndi is on Mounjaro 15 mg SQ weekly Adverse side effects: None Hunger is moderately controlled.  Cravings are moderately controlled.  Assessment/Plan:   Type II Diabetes HgbA1c is at goal. Last A1c was 5.6 CBGs: Fasting 80 -150       Episodes of hypoglycemia: no Medication(s): Mounjaro 15 mg SQ weekly  Lab Results  Component Value Date   HGBA1C 5.6 10/28/2022   HGBA1C 6.1 (H) 04/01/2022   HGBA1C 6.4 11/04/2021   Lab Results  Component Value Date   MICROALBUR 1.0 11/25/2022   LDLCALC 87 11/25/2022   CREATININE 0.85 11/25/2022   Lab Results  Component Value Date   GFR 92.11 11/04/2021   GFR 62.29 10/26/2020   GFR  85.17 03/20/2020    Plan: Continue Mounjaro 5.0 mg SQ weekly Continue all other medications.  Will keep all carbohydrates low both sweets and starches.  Will continue exercise regimen to 30 to 60 minutes on most days of the week.  Aim for 7 to 9 hours of sleep nightly.  Eat more low glycemic index foods.  She will not use any added salt.  She will keep her water intake high.   Hypertension She admits to eating more salt and she is retaining some fluid per the bio-impedence scale.  Hypertension control uncertain.  Medication(s): Atenolol 25 mg  BP Readings from Last 3 Encounters:  03/05/23 (!) 152/75  01/27/23 137/74  12/29/22 129/71   Lab Results  Component Value Date   CREATININE 0.85 11/25/2022   CREATININE 0.80 10/28/2022   CREATININE 0.84 04/01/2022   Lab Results  Component Value Date  GFR 92.11 11/04/2021   GFR 62.29 10/26/2020   GFR 85.17 03/20/2020    Plan: Continue all antihypertensives at current dosages. No added salt. Will keep sodium content to 1,500 mg or less per day.     Generalized Obesity: Current BMI BMI (Calculated): 26.09   Pharmacotherapy Plan Continue  Mounjaro 15 mg SQ weekly  Porcia is currently in the action stage of change. As such, her goal is to continue with weight loss efforts.  She has agreed to the Category 2 plan.  Exercise goals: Older adults should do exercises that maintain or improve balance if they are at risk of falling.   Behavioral modification strategies: increasing lean protein intake, meal planning , increase water intake, better snacking choices, planning for success, decreasing sodium intake, avoiding temptations, and keep healthy foods in the home.  Emrys has agreed to follow-up with our clinic in 4 weeks.      Objective:   VITALS: Per patient if applicable, see vitals. GENERAL: Alert and in no acute distress. CARDIOPULMONARY: No increased WOB. Speaking in clear sentences.  PSYCH: Pleasant and cooperative.  Speech normal rate and rhythm. Affect is appropriate. Insight and judgement are appropriate. Attention is focused, linear, and appropriate.  NEURO: Oriented as arrived to appointment on time with no prompting.   Attestation Statements:   This was prepared with the assistance of Engineer, civil (consulting).  Occasional wrong-word or sound-a-like substitutions may have occurred due to the inherent limitations of voice recognition   Corinna Capra, DO

## 2023-03-10 ENCOUNTER — Other Ambulatory Visit: Payer: Self-pay | Admitting: Family Medicine

## 2023-03-10 DIAGNOSIS — E1169 Type 2 diabetes mellitus with other specified complication: Secondary | ICD-10-CM

## 2023-04-07 ENCOUNTER — Encounter: Payer: Self-pay | Admitting: Bariatrics

## 2023-04-07 ENCOUNTER — Ambulatory Visit (INDEPENDENT_AMBULATORY_CARE_PROVIDER_SITE_OTHER): Payer: PRIVATE HEALTH INSURANCE | Admitting: Bariatrics

## 2023-04-07 VITALS — BP 136/76 | HR 68 | Temp 98.0°F | Ht 61.0 in | Wt 134.0 lb

## 2023-04-07 DIAGNOSIS — E669 Obesity, unspecified: Secondary | ICD-10-CM | POA: Diagnosis not present

## 2023-04-07 DIAGNOSIS — E119 Type 2 diabetes mellitus without complications: Secondary | ICD-10-CM | POA: Diagnosis not present

## 2023-04-07 DIAGNOSIS — E1151 Type 2 diabetes mellitus with diabetic peripheral angiopathy without gangrene: Secondary | ICD-10-CM

## 2023-04-07 DIAGNOSIS — Z6825 Body mass index (BMI) 25.0-25.9, adult: Secondary | ICD-10-CM | POA: Diagnosis not present

## 2023-04-07 DIAGNOSIS — E559 Vitamin D deficiency, unspecified: Secondary | ICD-10-CM

## 2023-04-07 MED ORDER — TIRZEPATIDE 15 MG/0.5ML ~~LOC~~ SOAJ: 15.0000 mg | SUBCUTANEOUS | 0 refills | Status: DC

## 2023-04-07 MED ORDER — VITAMIN D (ERGOCALCIFEROL) 1.25 MG (50000 UNIT) PO CAPS
50000.0000 [IU] | ORAL_CAPSULE | ORAL | 0 refills | Status: DC
Start: 2023-04-07 — End: 2023-05-19

## 2023-04-07 NOTE — Progress Notes (Signed)
 WEIGHT SUMMARY AND BIOMETRICS  Weight Lost Since Last Visit: 4lb  Weight Gained Since Last Visit: 0lb   Vitals Temp: 98 F (36.7 C) BP: 136/76 Pulse Rate: 68 SpO2: 100 %   Anthropometric Measurements Height: 5\' 1"  (1.549 m) Weight: 134 lb (60.8 kg) BMI (Calculated): 25.33 Weight at Last Visit: 138lb Weight Lost Since Last Visit: 4lb Weight Gained Since Last Visit: 0lb Starting Weight: 163lb Total Weight Loss (lbs): 29 lb (13.2 kg)   Body Composition  Body Fat %: 35.1 % Fat Mass (lbs): 47.2 lbs Muscle Mass (lbs): 82.8 lbs Total Body Water (lbs): 59.6 lbs Visceral Fat Rating : 8   Other Clinical Data Fasting: No Labs: No Today's Visit #: 58 Starting Date: 05/13/20    OBESITY Janaysia is here to discuss her progress with her obesity treatment plan along with follow-up of her obesity related diagnoses.    Nutrition Plan: the Category 2 plan - 90% adherence.  Current exercise: walking  Interim History:  She is down an additional 4 lbs. Eating all of the food on the plan., Protein intake is as prescribed, Is not skipping meals, Water intake is adequate., and Denies polyphagia   Pharmacotherapy: Mikenzie is on Mounjaro 15 mg SQ weekly Adverse side effects: None Hunger is moderately controlled.  Cravings are moderately controlled.  Assessment/Plan:   Type II Diabetes HgbA1c is at goal. Last A1c was 5.6 CBGs: Fasting 100's and 77 was her lowest value.        Episodes of hypoglycemia: no Medication(s): Mounjaro 15 mg SQ weekly  Lab Results  Component Value Date   HGBA1C 5.6 10/28/2022   HGBA1C 6.1 (H) 04/01/2022   HGBA1C 6.4 11/04/2021   Lab Results  Component Value Date   MICROALBUR 1.0 11/25/2022   LDLCALC 87 11/25/2022   CREATININE 0.85 11/25/2022   Lab Results  Component Value Date   GFR 92.11 11/04/2021   GFR 62.29 10/26/2020    GFR 85.17 03/20/2020    Plan: Continue and refill Mounjaro 15 mg SQ weekly Continue all other medications.  Will keep all carbohydrates low both sweets and starches.  Will continue exercise regimen to 30 to 60 minutes on most days of the week.  Aim for 7 to 9 hours of sleep nightly.  Eat more low glycemic index foods.  Will continue to eat at work.   Vitamin D Deficiency Vitamin D is not at goal of 50.  Most recent vitamin D level was 26.5. She is on  prescription ergocalciferol 50,000 IU weekly. Lab Results  Component Value Date   VD25OH 26.5 (L) 10/28/2022   VD25OH 35.9 04/01/2022   VD25OH 38.67 11/04/2021    Plan: Refill prescription vitamin D 50,000 IU weekly.     Generalized Obesity: Current BMI BMI (Calculated): 25.33   Pharmacotherapy Plan Continue and refill  Mounjaro 15 mg SQ weekly  Maisley is  currently in the action stage of change. As such, her goal is to continue with weight loss efforts.  She has agreed to the Category 2 plan.  Exercise goals: continue exercise at home.   Behavioral modification strategies: increasing lean protein intake, decreasing simple carbohydrates , no meal skipping, meal planning , increase water intake, better snacking choices, planning for success, get rid of junk food in the home, avoiding temptations, and keep healthy foods in the home.  Clarice has agreed to follow-up with our clinic in 4 weeks for labs.      Objective:   VITALS: Per patient if applicable, see vitals. GENERAL: Alert and in no acute distress. CARDIOPULMONARY: No increased WOB. Speaking in clear sentences.  PSYCH: Pleasant and cooperative. Speech normal rate and rhythm. Affect is appropriate. Insight and judgement are appropriate. Attention is focused, linear, and appropriate.  NEURO: Oriented as arrived to appointment on time with no prompting.   Attestation Statements:    This was prepared with the assistance of Engineer, civil (consulting).  Occasional  wrong-word or sound-a-like substitutions may have occurred due to the inherent limitations of voice recognition   Corinna Capra, DO

## 2023-05-02 ENCOUNTER — Other Ambulatory Visit: Payer: Self-pay | Admitting: Bariatrics

## 2023-05-02 DIAGNOSIS — E1151 Type 2 diabetes mellitus with diabetic peripheral angiopathy without gangrene: Secondary | ICD-10-CM

## 2023-05-06 ENCOUNTER — Ambulatory Visit (INDEPENDENT_AMBULATORY_CARE_PROVIDER_SITE_OTHER): Payer: PRIVATE HEALTH INSURANCE | Admitting: Bariatrics

## 2023-05-06 ENCOUNTER — Encounter: Payer: Self-pay | Admitting: Bariatrics

## 2023-05-06 ENCOUNTER — Telehealth: Payer: Self-pay | Admitting: Family Medicine

## 2023-05-06 VITALS — BP 138/79 | HR 64 | Temp 97.9°F | Ht 61.0 in | Wt 132.0 lb

## 2023-05-06 DIAGNOSIS — E669 Obesity, unspecified: Secondary | ICD-10-CM

## 2023-05-06 DIAGNOSIS — E559 Vitamin D deficiency, unspecified: Secondary | ICD-10-CM | POA: Diagnosis not present

## 2023-05-06 DIAGNOSIS — E785 Hyperlipidemia, unspecified: Secondary | ICD-10-CM

## 2023-05-06 DIAGNOSIS — E1169 Type 2 diabetes mellitus with other specified complication: Secondary | ICD-10-CM

## 2023-05-06 DIAGNOSIS — E119 Type 2 diabetes mellitus without complications: Secondary | ICD-10-CM | POA: Diagnosis not present

## 2023-05-06 DIAGNOSIS — Z7985 Long-term (current) use of injectable non-insulin antidiabetic drugs: Secondary | ICD-10-CM

## 2023-05-06 DIAGNOSIS — Z6824 Body mass index (BMI) 24.0-24.9, adult: Secondary | ICD-10-CM

## 2023-05-06 DIAGNOSIS — E1151 Type 2 diabetes mellitus with diabetic peripheral angiopathy without gangrene: Secondary | ICD-10-CM

## 2023-05-06 NOTE — Progress Notes (Signed)
 WEIGHT SUMMARY AND BIOMETRICS  Weight Lost Since Last Visit: 2lb  Weight Gained Since Last Visit: 0   Vitals Temp: 97.9 F (36.6 C) BP: 138/79 Pulse Rate: 64 SpO2: 96 %   Anthropometric Measurements Height: 5\' 1"  (1.549 m) Weight: 132 lb (59.9 kg) BMI (Calculated): 24.95 Weight at Last Visit: 134lb Weight Lost Since Last Visit: 2lb Weight Gained Since Last Visit: 0 Starting Weight: 163lb Total Weight Loss (lbs): 31 lb (14.1 kg)   Body Composition  Body Fat %: 33.5 % Fat Mass (lbs): 44.4 lbs Muscle Mass (lbs): 83.6 lbs Total Body Water (lbs): 59.2 lbs Visceral Fat Rating : 8   Other Clinical Data Fasting: yes Labs: yes Today's Visit #: 35 Starting Date: 05/13/20    OBESITY Nicole Richard is here to discuss her progress with her obesity treatment plan along with follow-up of her obesity related diagnoses.    Nutrition Plan: the Category 2 plan - 90% adherence.  Current exercise: walking  Interim History:  She is down another 2 pounds since her last visit.  She is working on weight maintenance at this time.  Eating all of the food on the plan., Protein intake is as prescribed, Is not skipping meals, and Water intake is adequate.   Pharmacotherapy: Nicole Richard is on Mounjaro 15 mg SQ weekly Adverse side effects: None Hunger is well controlled.  Cravings are well controlled.  Assessment/Plan:   Type II Diabetes HgbA1c is at goal. Last A1c was 5.6 Episodes of hypoglycemia: no Medication(s): Mounjaro 15 mg SQ weekly  Lab Results  Component Value Date   HGBA1C 5.6 10/28/2022   HGBA1C 6.1 (H) 04/01/2022   HGBA1C 6.4 11/04/2021   Lab Results  Component Value Date   MICROALBUR 1.0 11/25/2022   LDLCALC 87 11/25/2022   CREATININE 0.85 11/25/2022   Lab Results  Component Value Date   GFR 92.11 11/04/2021   GFR 62.29 10/26/2020   GFR 85.17  03/20/2020    Plan: Continue Mounjaro 15 mg SQ weekly Continue all other medications.  Will keep all carbohydrates low both sweets and starches.  Will continue exercise regimen to 30 to 60 minutes on most days of the week.  Aim for 7 to 9 hours of sleep nightly.  Eat more low glycemic index foods.   Vitamin D Deficiency Vitamin D is at goal of 50.  Most recent vitamin D level was 26.5. She is on  prescription ergocalciferol 50,000 IU weekly. Lab Results  Component Value Date   VD25OH 26.5 (L) 10/28/2022   VD25OH 35.9 04/01/2022   VD25OH 38.67 11/04/2021    Plan: Continue prescription vitamin D 50,000 IU weekly.  Will get some minimal sun exposure.   Hyperlipidemia LDL is not at goal. Medication(s): Lipitor Cardiovascular risk factors: dyslipidemia, hypertension, obesity (BMI >= 30 kg/m2), and sedentary lifestyle  Lab Results  Component Value Date   CHOL 167  11/25/2022   HDL 65 11/25/2022   LDLCALC 87 11/25/2022   LDLDIRECT 128.1 10/05/2013   TRIG 67 11/25/2022   CHOLHDL 2.6 11/25/2022   Lab Results  Component Value Date   ALT 17 11/25/2022   AST 16 11/25/2022   ALKPHOS 114 10/28/2022   BILITOT 0.9 11/25/2022   The 10-year ASCVD risk score (Arnett DK, et al., 2019) is: 13.7%   Values used to calculate the score:     Age: 68 years     Sex: Female     Is Non-Hispanic African American: No     Diabetic: Yes     Tobacco smoker: No     Systolic Blood Pressure: 138 mmHg     Is BP treated: Yes     HDL Cholesterol: 65 mg/dL     Total Cholesterol: 167 mg/dL  Plan:  Continue statin.  Will minimize all carbohydrates including starches and sweets.  Will avoid all trans fats.  Will read labels Will minimize saturated fats except the following: low fat meats in moderation, diary, and limited dark chocolate.   Labs done today (CMP, Lipids, HgbA1c, insulin, vitamin D).    Generalized Obesity: Current BMI BMI (Calculated): 24.95   Pharmacotherapy Plan Continue   Mounjaro 15 mg SQ weekly  Nicole Richard is currently in the action stage of change. As such, her goal is to continue with weight loss efforts.  She has agreed to the Category 2 plan.  Exercise goals: Older adults should determine their level of effort for physical activity relative to their level of fitness.   Behavioral modification strategies: increasing lean protein intake, no meal skipping, meal planning , increase water intake, better snacking choices, planning for success, decrease snacking , avoiding temptations, and keep healthy foods in the home.  Nicole Richard has agreed to follow-up with our clinic in 4 weeks.     Objective:   VITALS: Per patient if applicable, see vitals. GENERAL: Alert and in no acute distress. CARDIOPULMONARY: No increased WOB. Speaking in clear sentences.  PSYCH: Pleasant and cooperative. Speech normal rate and rhythm. Affect is appropriate. Insight and judgement are appropriate. Attention is focused, linear, and appropriate.  NEURO: Oriented as arrived to appointment on time with no prompting.   Attestation Statements:   This was prepared with the assistance of Engineer, civil (consulting).  Occasional wrong-word or sound-a-like substitutions may have occurred due to the inherent limitations of voice recognition   Kirk Peper, DO

## 2023-05-06 NOTE — Telephone Encounter (Signed)
 Copied from CRM [id number]. Topic: Medicare AWV >> May 06, 2023 11:17 AM Juliana Ocean wrote: Reason for CRM: Called LVM 05/06/2023 to schedule AWV. Please schedule Virtual or Telehealth visits ONLY.   Rosalee Collins; Care Guide Ambulatory Clinical Support [place] [facility] Direct Dial: [phone]

## 2023-05-07 LAB — COMPREHENSIVE METABOLIC PANEL WITH GFR
ALT: 31 IU/L (ref 0–32)
AST: 28 IU/L (ref 0–40)
Albumin: 4.4 g/dL (ref 3.9–4.9)
Alkaline Phosphatase: 44 IU/L (ref 44–121)
BUN/Creatinine Ratio: 23 (ref 12–28)
BUN: 21 mg/dL (ref 8–27)
Bilirubin Total: 0.7 mg/dL (ref 0.0–1.2)
CO2: 24 mmol/L (ref 20–29)
Calcium: 10.3 mg/dL (ref 8.7–10.3)
Chloride: 106 mmol/L (ref 96–106)
Creatinine, Ser: 0.92 mg/dL (ref 0.57–1.00)
Globulin, Total: 2.4 g/dL (ref 1.5–4.5)
Glucose: 89 mg/dL (ref 70–99)
Potassium: 4.8 mmol/L (ref 3.5–5.2)
Sodium: 143 mmol/L (ref 134–144)
Total Protein: 6.8 g/dL (ref 6.0–8.5)
eGFR: 69 mL/min/{1.73_m2} (ref >59)

## 2023-05-07 LAB — LIPID PANEL WITH LDL/HDL RATIO
Cholesterol, Total: 160 mg/dL (ref 100–199)
HDL: 63 mg/dL (ref >39)
LDL Chol Calc (NIH): 86 mg/dL (ref 0–99)
LDL/HDL Ratio: 1.4 ratio (ref 0.0–3.2)
Triglycerides: 55 mg/dL (ref 0–149)
VLDL Cholesterol Cal: 11 mg/dL (ref 5–40)

## 2023-05-07 LAB — HEMOGLOBIN A1C
Est. average glucose Bld gHb Est-mCnc: 123 mg/dL
Hgb A1c MFr Bld: 5.9 % — ABNORMAL HIGH (ref 4.8–5.6)

## 2023-05-07 LAB — INSULIN, RANDOM: INSULIN: 15 u[IU]/mL (ref 2.6–24.9)

## 2023-05-07 LAB — VITAMIN D 25 HYDROXY (VIT D DEFICIENCY, FRACTURES): Vit D, 25-Hydroxy: 77.3 ng/mL (ref 30.0–100.0)

## 2023-05-17 ENCOUNTER — Other Ambulatory Visit: Payer: Self-pay | Admitting: Family Medicine

## 2023-05-17 DIAGNOSIS — E559 Vitamin D deficiency, unspecified: Secondary | ICD-10-CM

## 2023-06-03 ENCOUNTER — Ambulatory Visit (INDEPENDENT_AMBULATORY_CARE_PROVIDER_SITE_OTHER): Admitting: Bariatrics

## 2023-06-03 ENCOUNTER — Encounter: Payer: Self-pay | Admitting: Bariatrics

## 2023-06-03 VITALS — BP 133/73 | HR 65 | Temp 98.0°F | Ht 61.0 in | Wt 132.0 lb

## 2023-06-03 DIAGNOSIS — Z6824 Body mass index (BMI) 24.0-24.9, adult: Secondary | ICD-10-CM | POA: Diagnosis not present

## 2023-06-03 DIAGNOSIS — R7303 Prediabetes: Secondary | ICD-10-CM

## 2023-06-03 DIAGNOSIS — E669 Obesity, unspecified: Secondary | ICD-10-CM | POA: Diagnosis not present

## 2023-06-03 DIAGNOSIS — E1151 Type 2 diabetes mellitus with diabetic peripheral angiopathy without gangrene: Secondary | ICD-10-CM

## 2023-06-03 NOTE — Progress Notes (Signed)
 WEIGHT SUMMARY AND BIOMETRICS  Weight Lost Since Last Visit: 0  Weight Gained Since Last Visit: 0   Vitals Temp: 98 F (36.7 C) BP: 133/73 Pulse Rate: 65 SpO2: 98 %   Anthropometric Measurements Height: 5\' 1"  (1.549 m) Weight: 132 lb (59.9 kg) BMI (Calculated): 24.95 Weight at Last Visit: 132lb Weight Lost Since Last Visit: 0 Weight Gained Since Last Visit: 0 Starting Weight: 163lb Total Weight Loss (lbs): 31 lb (14.1 kg)   Body Composition  Body Fat %: 33.8 % Fat Mass (lbs): 44.6 lbs Muscle Mass (lbs): 83 lbs Total Body Water (lbs): 59.8 lbs Visceral Fat Rating : 8   Other Clinical Data Fasting: no Labs: no Today's Visit #: 62 Starting Date: 05/13/20    OBESITY Nicole Richard is here to discuss her progress with her obesity treatment plan along with follow-up of her obesity related diagnoses.    Nutrition Plan: the Category 2 plan - 75% adherence.  Current exercise: walking  Interim History:  Her weight remains the same.  Eating all of the food on the plan., Protein intake is as prescribed, Is not skipping meals, and Water intake is adequate.   Pharmacotherapy: Keari is on Mounjaro  15 mg SQ weekly Adverse side effects: None Hunger is moderately controlled.  Cravings are moderately controlled.  Assessment/Plan:   Prediabetes Last A1c was 5.9  Medication(s): Mounjaro  15 mg SQ weekly  Lab Results  Component Value Date   INSULIN  15.0 05/06/2023   INSULIN  37.6 (H) 10/28/2022   INSULIN  21.8 04/01/2022   INSULIN  40.1 (H) 07/09/2021   INSULIN  41.3 (H) 04/08/2021    Plan: Will minimize all refined carbohydrates both sweets and starches.  Will work on the plan and exercise.  Consider both aerobic and resistance training.  Will keep protein, water, and fiber intake high.  Increase Polyunsaturated and Monounsaturated fats to increase satiety  and encourage weight loss.  Aim for 7 to 9 hours of sleep nightly.  Will continue medications.  Continue Mounjaro  15 mg SQ weekly    Generalized Obesity: Current BMI BMI (Calculated): 24.95   Labs reviewed today (CMP, Lipids, HgbA1c, insulin , vitamin D ) from 05/06/2023.   Pharmacotherapy Plan Continue  Mounjaro  15 mg SQ weekly  Alzora is currently in the action stage of change. As such, her goal is to maintain weight for now.  She has agreed to the Category 2 plan.  Exercise goals: Older adults should determine their level of effort for physical activity relative to their level of fitness.   Behavioral modification strategies: increasing lean protein intake, no meal skipping, decrease eating out, meal planning , increase water intake, better snacking choices, planning for success, avoiding temptations, and measure portion sizes.  Malgorzata has agreed to follow-up with our clinic in 4 weeks.      Objective:   VITALS: Per patient if applicable, see vitals. GENERAL: Alert and in  no acute distress. CARDIOPULMONARY: No increased WOB. Speaking in clear sentences.  PSYCH: Pleasant and cooperative. Speech normal rate and rhythm. Affect is appropriate. Insight and judgement are appropriate. Attention is focused, linear, and appropriate.  NEURO: Oriented as arrived to appointment on time with no prompting.   Attestation Statements:   This was prepared with the assistance of Engineer, civil (consulting).  Occasional wrong-word or sound-a-like substitutions may have occurred due to the inherent limitations of voice recognition   Nicole Peper, DO

## 2023-06-18 ENCOUNTER — Other Ambulatory Visit: Payer: Self-pay | Admitting: Family Medicine

## 2023-06-18 DIAGNOSIS — D369 Benign neoplasm, unspecified site: Secondary | ICD-10-CM

## 2023-07-02 ENCOUNTER — Other Ambulatory Visit: Payer: Self-pay | Admitting: Bariatrics

## 2023-07-02 DIAGNOSIS — E559 Vitamin D deficiency, unspecified: Secondary | ICD-10-CM

## 2023-07-03 ENCOUNTER — Other Ambulatory Visit: Payer: Self-pay | Admitting: Bariatrics

## 2023-07-03 DIAGNOSIS — E1151 Type 2 diabetes mellitus with diabetic peripheral angiopathy without gangrene: Secondary | ICD-10-CM

## 2023-07-07 ENCOUNTER — Ambulatory Visit (INDEPENDENT_AMBULATORY_CARE_PROVIDER_SITE_OTHER): Admitting: Bariatrics

## 2023-07-07 ENCOUNTER — Encounter: Payer: Self-pay | Admitting: Bariatrics

## 2023-07-07 VITALS — BP 151/84 | HR 74 | Temp 98.0°F | Ht 61.0 in | Wt 133.0 lb

## 2023-07-07 DIAGNOSIS — Z6825 Body mass index (BMI) 25.0-25.9, adult: Secondary | ICD-10-CM | POA: Diagnosis not present

## 2023-07-07 DIAGNOSIS — Z7985 Long-term (current) use of injectable non-insulin antidiabetic drugs: Secondary | ICD-10-CM

## 2023-07-07 DIAGNOSIS — E559 Vitamin D deficiency, unspecified: Secondary | ICD-10-CM | POA: Diagnosis not present

## 2023-07-07 DIAGNOSIS — E669 Obesity, unspecified: Secondary | ICD-10-CM | POA: Diagnosis not present

## 2023-07-07 DIAGNOSIS — E119 Type 2 diabetes mellitus without complications: Secondary | ICD-10-CM

## 2023-07-07 DIAGNOSIS — E1151 Type 2 diabetes mellitus with diabetic peripheral angiopathy without gangrene: Secondary | ICD-10-CM

## 2023-07-07 MED ORDER — TIRZEPATIDE 15 MG/0.5ML ~~LOC~~ SOAJ: 15.0000 mg | SUBCUTANEOUS | 0 refills | Status: DC

## 2023-07-07 NOTE — Progress Notes (Signed)
 WEIGHT SUMMARY AND BIOMETRICS  Weight Lost Since Last Visit: 0  Weight Gained Since Last Visit: 1lb   Vitals Temp: 98 F (36.7 C) BP: (!) 151/84 Pulse Rate: 74 SpO2: 98 %   Anthropometric Measurements Height: 5' 1 (1.549 m) Weight: 133 lb (60.3 kg) BMI (Calculated): 25.14 Weight at Last Visit: 132lb Weight Lost Since Last Visit: 0 Weight Gained Since Last Visit: 1lb Starting Weight: 163lb Total Weight Loss (lbs): 30 lb (13.6 kg)   Body Composition  Body Fat %: 34.3 % Fat Mass (lbs): 45.8 lbs Muscle Mass (lbs): 83.2 lbs Total Body Water (lbs): 61.6 lbs Visceral Fat Rating : 8   Other Clinical Data Fasting: no Labs: no Today's Visit #: 37 Starting Date: 05/13/20    OBESITY Nicole Richard is here to discuss her progress with her obesity treatment plan along with follow-up of her obesity related diagnoses.    Nutrition Plan: the Category 2 plan - 75% adherence.  Current exercise: walking  Interim History:  She is up 1 lb since her last visit.  Protein intake is as prescribed, Is not skipping meals, Water intake is adequate., and Denies polyphagia   Pharmacotherapy: Nicole Richard is on Mounjaro  15 mg SQ weekly Adverse side effects: None Hunger is moderately controlled.  Cravings are moderately controlled.  Assessment/Plan:   Vitamin D  Deficiency Vitamin D  is at goal of 50.  Most recent vitamin D  level was 77.3. She is on  prescription ergocalciferol  50,000 IU weekly. Lab Results  Component Value Date   VD25OH 77.3 05/06/2023   VD25OH 26.5 (L) 10/28/2022   VD25OH 35.9 04/01/2022    Plan: Refill prescription vitamin D  50,000 IU weekly.    Type II Diabetes HgbA1c is at goal. Last A1c was 5.9 CBGs: Not checking      Episodes of hypoglycemia: no Medication(s): Mounjaro  15 mg SQ weekly  Lab Results  Component Value Date   HGBA1C 5.9 (H)  05/06/2023   HGBA1C 5.6 10/28/2022   HGBA1C 6.1 (H) 04/01/2022   Lab Results  Component Value Date   MICROALBUR 1.0 11/25/2022   LDLCALC 86 05/06/2023   CREATININE 0.92 05/06/2023   Lab Results  Component Value Date   GFR 92.11 11/04/2021   GFR 62.29 10/26/2020   GFR 85.17 03/20/2020    Plan: Continue and refill Mounjaro  15 mg SQ weekly Continue all other medications.  Will keep all carbohydrates low both sweets and starches.  Will continue exercise regimen to 30 to 60 minutes on most days of the week.  Aim for 7 to 9 hours of sleep nightly.  Eat more low glycemic index foods.    Generalized Obesity: Current BMI BMI (Calculated): 25.14   Pharmacotherapy Plan Continue  Mounjaro  15 mg SQ weekly  Nicole Richard is currently in the action stage of change. As such, her goal is to continue with weight loss efforts.  She has  agreed to the Category 2 plan.  Exercise goals: For substantial health benefits, adults should do at least 150 minutes (2 hours and 30 minutes) a week of moderate-intensity, or 75 minutes (1 hour and 15 minutes) a week of vigorous-intensity aerobic physical activity, or an equivalent combination of moderate- and vigorous-intensity aerobic activity. Aerobic activity should be performed in episodes of at least 10 minutes, and preferably, it should be spread throughout the week.  Behavioral modification strategies: increasing lean protein intake, no meal skipping, meal planning , better snacking choices, planning for success, increasing vegetables, avoiding temptations, and keep healthy foods in the home.  Nicole Richard has agreed to follow-up with our clinic in 4 weeks.      Objective:   VITALS: Per patient if applicable, see vitals. GENERAL: Alert and in no acute distress. CARDIOPULMONARY: No increased WOB. Speaking in clear sentences.  PSYCH: Pleasant and cooperative. Speech normal rate and rhythm. Affect is appropriate. Insight and judgement are appropriate. Attention is  focused, linear, and appropriate.  NEURO: Oriented as arrived to appointment on time with no prompting.   Attestation Statements:   This was prepared with the assistance of Engineer, civil (consulting).  Occasional wrong-word or sound-a-like substitutions may have occurred due to the inherent limitations of voice recognition   Kirk Peper, DO

## 2023-07-10 ENCOUNTER — Other Ambulatory Visit: Payer: Self-pay | Admitting: Family Medicine

## 2023-07-10 DIAGNOSIS — I10 Essential (primary) hypertension: Secondary | ICD-10-CM

## 2023-07-28 LAB — HM DIABETES EYE EXAM

## 2023-08-05 ENCOUNTER — Ambulatory Visit (INDEPENDENT_AMBULATORY_CARE_PROVIDER_SITE_OTHER): Admitting: Bariatrics

## 2023-08-05 ENCOUNTER — Encounter: Payer: Self-pay | Admitting: Bariatrics

## 2023-08-05 VITALS — BP 126/69 | HR 71 | Temp 97.9°F | Ht 61.0 in | Wt 133.0 lb

## 2023-08-05 DIAGNOSIS — E119 Type 2 diabetes mellitus without complications: Secondary | ICD-10-CM

## 2023-08-05 DIAGNOSIS — Z6825 Body mass index (BMI) 25.0-25.9, adult: Secondary | ICD-10-CM

## 2023-08-05 DIAGNOSIS — E1169 Type 2 diabetes mellitus with other specified complication: Secondary | ICD-10-CM

## 2023-08-05 DIAGNOSIS — E1151 Type 2 diabetes mellitus with diabetic peripheral angiopathy without gangrene: Secondary | ICD-10-CM

## 2023-08-05 DIAGNOSIS — Z7985 Long-term (current) use of injectable non-insulin antidiabetic drugs: Secondary | ICD-10-CM

## 2023-08-05 DIAGNOSIS — E785 Hyperlipidemia, unspecified: Secondary | ICD-10-CM | POA: Diagnosis not present

## 2023-08-05 DIAGNOSIS — E669 Obesity, unspecified: Secondary | ICD-10-CM | POA: Diagnosis not present

## 2023-08-05 NOTE — Progress Notes (Signed)
 WEIGHT SUMMARY AND BIOMETRICS  Weight Lost Since Last Visit: 0  Weight Gained Since Last Visit: 0   Vitals Temp: 97.9 F (36.6 C) BP: 126/69 Pulse Rate: 71 SpO2: 98 %   Anthropometric Measurements Height: 5' 1 (1.549 m) Weight: 133 lb (60.3 kg) BMI (Calculated): 25.14 Weight at Last Visit: 133lb Weight Lost Since Last Visit: 0 Weight Gained Since Last Visit: 0 Starting Weight: 163lb Total Weight Loss (lbs): 30 lb (13.6 kg)   Body Composition  Body Fat %: 33.8 % Fat Mass (lbs): 45.2 lbs Muscle Mass (lbs): 84 lbs Total Body Water (lbs): 60.8 lbs Visceral Fat Rating : 8   Other Clinical Data Fasting: no Labs: no Today's Visit #: 9 Starting Date: 05/13/20    OBESITY Gwynn is here to discuss her progress with her obesity treatment plan along with follow-up of her obesity related diagnoses.    Nutrition Plan: the Category 2 plan - 75% adherence.  Current exercise: walking  Interim History:  Her weight remains the same. She feels satisfied with her meals.  Eating all of the food on the plan., Is not skipping meals, Water intake is adequate., and Denies polyphagia   Pharmacotherapy: Kaliegh is on Mounjaro  15 mg SQ weekly Adverse side effects: None Hunger is well controlled.  Cravings are moderately controlled.  Assessment/Plan:   Type II Diabetes HgbA1c is at goal. Last A1c was 5.9 CBGs: Fasting 70 to 125      Episodes of hypoglycemia: no Medication(s): Mounjaro  15 mg SQ weekly  Lab Results  Component Value Date   HGBA1C 5.9 (H) 05/06/2023   HGBA1C 5.6 10/28/2022   HGBA1C 6.1 (H) 04/01/2022   Lab Results  Component Value Date   MICROALBUR 0.6 10/21/2019   LDLCALC 86 05/06/2023   CREATININE 0.92 05/06/2023   Lab Results  Component Value Date   GFR 92.11 11/04/2021   GFR 62.29 10/26/2020   GFR 85.17 03/20/2020     Plan: Continue Mounjaro  15 mg SQ weekly Continue all other medications.  Will keep all carbohydrates low both sweets and starches.  Will continue exercise regimen to 30 to 60 minutes on most days of the week.  Information sheet given on healthy protein and healthy carbohydrates and discussed.  Hyperlipidemia LDL is not at goal. Medication(s): Lipitor and fenofibrate .  Cardiovascular risk factors: advanced age (older than 11 for men, 4 for women), dyslipidemia, hypertension, obesity (BMI >= 30 kg/m2), and sedentary lifestyle  Lab Results  Component Value Date   CHOL 160 05/06/2023   HDL 63 05/06/2023   LDLCALC 86 05/06/2023   LDLDIRECT 128.1 10/05/2013   TRIG 55 05/06/2023   CHOLHDL 2.6 11/25/2022   Lab Results  Component Value Date   ALT 31 05/06/2023   AST 28 05/06/2023   ALKPHOS 44 05/06/2023   BILITOT 0.7 05/06/2023   The 10-year ASCVD risk score (Arnett DK,  et al., 2019) is: 12.9%   Values used to calculate the score:     Age: 31 years     Clincally relevant sex: Female     Is Non-Hispanic African American: No     Diabetic: Yes     Tobacco smoker: No     Systolic Blood Pressure: 126 mmHg     Is BP treated: Yes     HDL Cholesterol: 63 mg/dL     Total Cholesterol: 160 mg/dL  Plan:  Continue statin.  Will continue to cook more meals at home. She will continue to journal. Will avoid all trans fats.  Will read labels Will minimize saturated fats except the following: low fat meats in moderation, diary, and limited dark chocolate.     Generalized Obesity: Current BMI BMI (Calculated): 25.14   Pharmacotherapy Plan Continue  Mounjaro  15 mg SQ weekly  Tynleigh is currently in the action stage of change. As such, her goal is to continue with weight loss efforts.  She has agreed to the Category 2 plan.  Exercise goals: Older adults should determine their level of effort for physical activity relative to their level of fitness.   Behavioral modification  strategies: increasing lean protein intake, no meal skipping, meal planning , increase water intake, better snacking choices, increasing vegetables, decrease snacking , avoiding temptations, keep healthy foods in the home, and measure portion sizes.  Kaveri has agreed to follow-up with our clinic in 4 weeks.   Objective:   VITALS: Per patient if applicable, see vitals. GENERAL: Alert and in no acute distress. CARDIOPULMONARY: No increased WOB. Speaking in clear sentences.  PSYCH: Pleasant and cooperative. Speech normal rate and rhythm. Affect is appropriate. Insight and judgement are appropriate. Attention is focused, linear, and appropriate.  NEURO: Oriented as arrived to appointment on time with no prompting.   Attestation Statements:   This was prepared with the assistance of Engineer, civil (consulting).  Occasional wrong-word or sound-a-like substitutions may have occurred due to the inherent limitations of voice recognition   Clayborne Daring, DO

## 2023-08-07 ENCOUNTER — Other Ambulatory Visit: Payer: Self-pay | Admitting: Family Medicine

## 2023-08-07 DIAGNOSIS — I10 Essential (primary) hypertension: Secondary | ICD-10-CM

## 2023-08-18 ENCOUNTER — Other Ambulatory Visit: Payer: Self-pay | Admitting: Family Medicine

## 2023-08-18 DIAGNOSIS — E559 Vitamin D deficiency, unspecified: Secondary | ICD-10-CM

## 2023-08-31 ENCOUNTER — Telehealth: Payer: Self-pay | Admitting: *Deleted

## 2023-08-31 ENCOUNTER — Ambulatory Visit (INDEPENDENT_AMBULATORY_CARE_PROVIDER_SITE_OTHER): Admitting: *Deleted

## 2023-08-31 VITALS — BP 124/69 | Temp 98.2°F | Resp 18 | Ht 61.0 in | Wt 140.0 lb

## 2023-08-31 DIAGNOSIS — Z0000 Encounter for general adult medical examination without abnormal findings: Secondary | ICD-10-CM | POA: Diagnosis not present

## 2023-08-31 DIAGNOSIS — Z1211 Encounter for screening for malignant neoplasm of colon: Secondary | ICD-10-CM

## 2023-08-31 NOTE — Patient Instructions (Signed)
 Nicole Richard , Thank you for taking time out of your busy schedule to complete your Annual Wellness Visit with me. I enjoyed our conversation and look forward to speaking with you again next year. I, as well as your care team,  appreciate your ongoing commitment to your health goals. Please review the following plan we discussed and let me know if I can assist you in the future. Your Game plan/ To Do List    Referrals: If you haven't heard from the office you've been referred to, please reach out to them at the phone provided.  [facility]:  [phone] Follow up Visits: Next Medicare AWV with our clinical staff:  08/31/24 8:20am   Next Office Visit with your provider: 09/18/23 8:20am  Clinician Recommendations:  Aim for 30 minutes of exercise or brisk walking, 6-8 glasses of water, and 5 servings of fruits and vegetables each day.       This is a list of the screening recommended for you and due dates:  Health Maintenance  Topic Date Due   Yearly kidney health urinalysis for diabetes  10/20/2020   COVID-19 Vaccine (6 - 2024-25 season) 09/21/2022   DTaP/Tdap/Td vaccine (3 - Td or Tdap) 10/19/2022   Complete foot exam   05/08/2023   Flu Shot  08/21/2023   Colon Cancer Screening  09/10/2023   Mammogram  09/23/2023   Hemoglobin A1C  11/05/2023   Medicare Annual Wellness Visit  11/25/2023   Yearly kidney function blood test for diabetes  05/05/2024   Eye exam for diabetics  07/27/2024   Pneumococcal Vaccine for age over 68  Completed   DEXA scan (bone density measurement)  Completed   Hepatitis C Screening  Completed   Zoster (Shingles) Vaccine  Completed   Hepatitis B Vaccine  Aged Out   HPV Vaccine  Aged Out   Meningitis B Vaccine  Aged Out    Advanced directives: (Copy Requested) Please bring a copy of your health care power of attorney and living will to the office to be added to your chart at your convenience. You can mail to [facility] [address]  [place], [place] [address] or email to [email or web address] Advance Care Planning is important because it:  [x]  Makes sure you receive the medical care that is consistent with your values, goals, and preferences  [x]  It provides guidance to your family and loved ones and reduces their decisional burden about whether or not they are making the right decisions based on your wishes.  Follow the link provided in your after visit summary or read over the paperwork we have mailed to you to help you started getting your Advance Directives in place. If you need assistance in completing these, please reach out to us  so that we can help you!  See attachments for Preventive Care and Fall Prevention Tips.

## 2023-08-31 NOTE — Telephone Encounter (Signed)
 Pt had AWV today. Reports that 3 family members have passed away in the last couple of weeks. Has an ailing father in [place]  that she may need to care for. Scheduled follow up with you on 09/18/23 and wants to discuss transitioning weight management to you.

## 2023-08-31 NOTE — Progress Notes (Signed)
 Subjective:   Nicole Richard is a 68 y.o. who presents for a Medicare Wellness preventive visit.  As a reminder, Annual Wellness Visits don't include a physical exam, and some assessments may be limited, especially if this visit is performed virtually. We may recommend an in-person follow-up visit with your provider if needed.  Visit Complete: In person  Persons Participating in Visit: Patient.  AWV Questionnaire: Yes: Patient Medicare AWV questionnaire was completed by the patient on 08/24/23; I have confirmed that all information answered by patient is correct and no changes since this date.  Cardiac Risk Factors include: advanced age (>62men, >25 women);dyslipidemia;hypertension;diabetes mellitus;Other (see comment), Risk factor comments: Asthma     Objective:    Today's Vitals   08/31/23 0816  BP: 124/69  Resp: 18  Temp: 98.2 F (36.8 C)  TempSrc: Oral  SpO2: 98%  Weight: 140 lb (63.5 kg)  Height: 5' 1 (1.549 m)   Body mass index is 26.45 kg/m.     08/31/2023    8:30 AM 04/09/2020    6:01 AM 04/26/2016   12:29 AM  Advanced Directives  Does Patient Have a Medical Advance Directive? Yes Yes No   Type of Estate agent of [place];Living will Healthcare Power of [place];Living will   Does patient want to make changes to medical advance directive? No - Patient declined No - Patient declined   Copy of Healthcare Power of Attorney in Chart? No - copy requested No - copy requested   Would patient like information on creating a medical advance directive?   No - Patient declined      Data saved with a previous flowsheet row definition    Current Medications (verified) Outpatient Encounter Medications as of 08/31/2023  Medication Sig   alendronate  (FOSAMAX ) 70 MG tablet Take 1 tablet (70 mg total) by mouth every 7 (seven) days. Take with a full glass of water on an empty stomach.   aspirin  81 MG chewable tablet Chew 81 mg by mouth at bedtime.   atenolol   (TENORMIN ) 25 MG tablet Take 1 tablet (25 mg total) by mouth daily. Needs appt   atorvastatin  (LIPITOR) 40 MG tablet TAKE 1 TABLET BY MOUTH EVERYDAY AT BEDTIME   bromocriptine  (PARLODEL ) 2.5 MG tablet TAKE 0.5 TABLETS BY MOUTH DAILY.   Calcium  Carbonate-Vitamin D  (OSCAL 500/200 D-3 PO) Take 1 tablet by mouth at bedtime.   Cholecalciferol (VITAMIN D -3) 25 MCG (1000 UT) CAPS Take 1 capsule by mouth daily.   Continuous Glucose Sensor (FREESTYLE LIBRE 14 DAY SENSOR) MISC 1 each by Does not apply route every 14 (fourteen) days.   fenofibrate  160 MG tablet Take 1 tablet (160 mg total) by mouth daily.   Ipratropium-Albuterol  (COMBIVENT  RESPIMAT) 20-100 MCG/ACT AERS respimat Inhale 1 puff into the lungs 4 (four) times daily as needed for wheezing or shortness of breath.   mometasone  (NASONEX ) 50 MCG/ACT nasal spray Place 2 sprays into the nose daily.   montelukast  (SINGULAIR ) 10 MG tablet Take 1 tablet (10 mg total) by mouth daily.   nitroGLYCERIN  (NITROSTAT ) 0.4 MG SL tablet Place 1 tablet (0.4 mg total) under the tongue every 5 (five) minutes as needed for chest pain. 1rst attempt, patient needs an appt for additional refills   pantoprazole  (PROTONIX ) 40 MG tablet Take 1 tablet (40 mg total) by mouth 2 (two) times daily.   Propylene Glycol 0.6 % SOLN Apply 1 drop to eye 4 (four) times daily as needed for dry eyes (dry/irritated eyes).  tirzepatide  (MOUNJARO ) 15 MG/0.5ML Pen Inject 15 mg into the skin once a week.   Vitamin D , Ergocalciferol , (DRISDOL ) 1.25 MG (50000 UNIT) CAPS capsule TAKE ONE CAPSULE BY MOUTH EVERY 7 DAYS   No facility-administered encounter medications on file as of 08/31/2023.    Allergies (verified) Contrast media [iodinated contrast media], Metamucil [psyllium], and Mustard   History: Past Medical History:  Diagnosis Date   Abdominal pain, left upper quadrant 03/01/2013   Abnormal liver function tests 03/01/2013   Allergy    seasonal   Angina at rest [facility]) 03/21/2020    Angina pectoris (HCC) 03/21/2020   Asthma    Back pain    BREAST IMPLANTS, BILATERAL, HX OF 07/28/2007   Qualifier: Diagnosis of  By: Antonio ROSALEA Rockers     CYSTOCELE WITH INCOMPLETE UTERINE PROLAPSE 05/18/2006   Qualifier: Diagnosis of  By: Antonio ROSALEA Rockers     Diabetes (HCC) 01/29/2014   Diabetes mellitus without complication (HCC)    Edema of both lower extremities    ESOPHAGEAL STRICTURE 11/09/2009   Qualifier: Diagnosis of  By: Bettie CMA (AAMA), [name]     Essential hypertension 05/18/2006   Qualifier: Diagnosis of  By: Antonio ROSALEA Rockers     Extrinsic asthma with exacerbation, unspecified asthma severity 10/26/2020   GERD 09/14/2009   Qualifier: Diagnosis of  By: Antonio ROSALEA Rockers     GERD (gastroesophageal reflux disease)    HEADACHE 05/18/2006   Qualifier: Diagnosis of  By: Antonio ROSALEA Rockers     Hirsutism 05/18/2006   Qualifier: Diagnosis of  By: Antonio ROSALEA Rockers     Hyperlipidemia    Hyperlipidemia associated with type 2 diabetes mellitus (HCC) 04/15/2019   Hypertension    Kidney stones    LAPAROSCOPY, HX OF 05/18/2006   Qualifier: Diagnosis of  By: Antonio ROSALEA Rockers     Mixed dyslipidemia 06/22/2020   Nonspecific (abnormal) findings on radiological and other examination of biliary tract 03/01/2013   Obesity (BMI 30-39.9) 10/18/2012   Other chest pain 03/21/2020   Palpitation 03/21/2020   Palpitations    POSTMENOPAUSAL STATUS 09/14/2009   Qualifier: Diagnosis of  By: Antonio ROSALEA Rockers     Preventative health care 10/26/2020   Seasonal allergic rhinitis 10/26/2020   SKIN TAG 10/05/2009   Qualifier: Diagnosis of  By: Antonio ROSALEA Rockers     SOB (shortness of breath)    Uncontrolled type 2 diabetes mellitus with hyperglycemia (HCC) 04/15/2019   Vitamin D  deficiency    Vitamin D  insufficiency 05/21/2020   Past Surgical History:  Procedure Laterality Date   ABDOMINAL HYSTERECTOMY  08/2004   APPENDECTOMY     AUGMENTATION MAMMAPLASTY Bilateral 2000   BREAST ENHANCEMENT SURGERY  2001   COLONOSCOPY      LEFT HEART CATH AND CORONARY ANGIOGRAPHY N/A 04/09/2020   Procedure: LEFT HEART CATH AND CORONARY ANGIOGRAPHY;  Surgeon: Anner Alm ORN, MD;  Location: [facility] INVASIVE CV LAB;  Service: Cardiovascular;  Laterality: N/A;   POLYPECTOMY  2009   sigmoid polyps   SHOULDER ARTHROSCOPY W/ ROTATOR CUFF REPAIR Right 06/06/2015   caffrey   TONSILLECTOMY     TUBAL LIGATION     Family History  Problem Relation Age of Onset   Cancer Mother    Rheumatic fever Mother    Irritable bowel syndrome Mother    Breast cancer Mother    Diabetes Mother    High blood pressure Mother    High Cholesterol Mother    Heart disease Mother  Stroke Mother    Sudden death Mother    Obesity Mother    Diabetes Father    Colon polyps Father    Kidney disease Father    High blood pressure Father    High Cholesterol Father    Heart disease Father    Obesity Father    Macular degeneration Father    CAD Father    Hyperlipidemia Sister    Hypertension Sister    Stroke Sister    Heart disease Brother    Heart disease Brother    Hypothyroidism Brother    Cervical cancer Maternal Grandmother    Breast cancer Maternal Grandmother    Colon cancer Maternal Grandmother    Uterine cancer Maternal Grandmother    Coronary artery disease Other    Hyperlipidemia Other    Hypertension Other    Asthma Other    Esophageal cancer Neg Hx    Rectal cancer Neg Hx    Stomach cancer Neg Hx    Social History   Socioeconomic History   Marital status: Divorced    Spouse name: Not on file   Number of children: 2   Years of education: Not on file   Highest education level: Some college, no degree  Occupational History   Occupation: Nurse    Comment: [email or web address]  Tobacco Use   Smoking status: Never   Smokeless tobacco: Never  Vaping Use   Vaping status: Never Used  Substance and Sexual Activity   Alcohol use: Yes    Comment: rare   Drug use: No   Sexual activity: Not Currently    Partners: Male  Other Topics  Concern   Not on file  Social History Narrative   Youth worker , and pt daily   Social Drivers of Health   Financial Resource Strain: Low Risk  (08/24/2023)   Overall Financial Resource Strain (CARDIA)    Difficulty of Paying Living Expenses: Not very hard  Food Insecurity: No Food Insecurity (08/31/2023)   Hunger Vital Sign    Worried About Running Out of Food in the Last Year: Never true    Ran Out of Food in the Last Year: Never true  Transportation Needs: No Transportation Needs (08/24/2023)   PRAPARE - Administrator, Civil Service (Medical): No    Lack of Transportation (Non-Medical): No  Physical Activity: Insufficiently Active (08/31/2023)   Exercise Vital Sign    Days of Exercise per Week: 3 days    Minutes of Exercise per Session: 20 min  Stress: No Stress Concern Present (08/24/2023)   [employer] of Occupational Health - Occupational Stress Questionnaire    Feeling of Stress: Not at all  Social Connections: Moderately Isolated (08/24/2023)   Social Connection and Isolation Panel    Frequency of Communication with Friends and Family: More than three times a week    Frequency of Social Gatherings with Friends and Family: More than three times a week    Attends Religious Services: More than 4 times per year    Active Member of [employer] or Organizations: No    Attends Engineer, structural: Not on file    Marital Status: Divorced    Tobacco Counseling Counseling given: Not Answered    Clinical Intake:  Pre-visit preparation completed: Yes  Pain : No/denies pain     BMI - recorded: 26.45 Nutritional Risks: None Diabetes: Yes CBG done?: No  Lab Results  Component Value Date   HGBA1C 5.9 (H) 05/06/2023   HGBA1C 5.6  10/28/2022   HGBA1C 6.1 (H) 04/01/2022     How often do you need to have someone help you when you read instructions, pamphlets, or other written materials from your doctor or pharmacy?: 1 - Never What is the last grade  level you completed in school?: some college  Interpreter Needed?: No  Information entered by :: Lolita Libra, CMA   Activities of Daily Living     08/24/2023   12:15 AM 11/25/2022   11:38 AM  In your present state of health, do you have any difficulty performing the following activities:  Hearing? 0 0  Vision? 0 0  Difficulty concentrating or making decisions? 0 0  Walking or climbing stairs? 0 0  Dressing or bathing? 0 0  Doing errands, shopping? 0 0  Preparing Food and eating ? N   Using the Toilet? N   In the past six months, have you accidently leaked urine? Y   Comment Has discussed with PCP, only occurs occasionally   Do you have problems with loss of bowel control? N   Managing your Medications? N   Managing your Finances? N   Housekeeping or managing your Housekeeping? N     Patient Care Team: Antonio Meth, Jamee SAUNDERS, DO as PCP - General Charlanne Groom, MD as Consulting Physician (Gastroenterology) Delores Clayborne LABOR, DO as Consulting Physician (Bariatrics) Landrum Laurel, OD (Optometry)  I have updated your Care Teams any recent Medical Services you may have received from other providers in the past year.     Assessment:   This is a routine wellness examination for Nicole Richard.  Hearing/Vision screen Hearing Screening - Comments:: Denies hearing difficulties.  Vision Screening - Comments:: Wears RX glasses -- up to date with routine eye exams with Laurel Landrum @ [address]    Goals Addressed               This Visit's Progress     Patient Stated (pt-stated)        Maintain current health.       Depression Screen     08/31/2023    8:26 AM 11/25/2022   11:35 AM 11/25/2022   10:52 AM 11/04/2021    9:06 AM 02/12/2021    1:50 PM 05/16/2020    7:43 AM 05/24/2019    2:47 PM  PHQ 2/9 Scores  PHQ - 2 Score 2 0 0 0 0 4 0  PHQ- 9 Score 3     11     Fall Risk     08/24/2023   12:15 AM 11/25/2022   10:52 AM 11/04/2021    9:06 AM 02/12/2021    1:50 PM  Fall  Risk   Falls in the past year? 0 0 0 0  Number falls in past yr: 0 0 0 0  Injury with Fall? 0 0 0 0  Risk for fall due to : No Fall Risks   No Fall Risks  Follow up Education provided Falls evaluation completed  Falls evaluation completed      Data saved with a previous flowsheet row definition    MEDICARE RISK AT HOME:  Medicare Risk at Home Any stairs in or around the home?: (Patient-Rptd) Yes If so, are there any without handrails?: (Patient-Rptd) Yes Home free of loose throw rugs in walkways, pet beds, electrical cords, etc?: (Patient-Rptd) No Adequate lighting in your home to reduce risk of falls?: (Patient-Rptd) Yes Life alert?: (Patient-Rptd) No Use of a cane, walker or w/c?: (Patient-Rptd) No Grab bars  in the bathroom?: (Patient-Rptd) No Shower chair or bench in shower?: (Patient-Rptd) No Elevated toilet seat or a handicapped toilet?: (Patient-Rptd) No  TIMED UP AND GO:  Was the test performed?  Yes  Length of time to ambulate 10 feet: 5 sec Gait steady and fast without use of assistive device  Cognitive Function: 6CIT completed    11/25/2022   11:39 AM  MMSE - Mini Mental State Exam  Orientation to time 5  Orientation to Place 5  Registration 3  Attention/ Calculation 5  Recall 3  Language- name 2 objects 2  Language- repeat 1  Language- follow 3 step command 3  Language- read & follow direction 1  Write a sentence 1  Copy design 1  Total score 30        08/31/2023    8:31 AM  6CIT Screen  What Year? 0 points  What month? 0 points  What time? 0 points  Count back from 20 0 points  Months in reverse 0 points  Repeat phrase 0 points  Total Score 0 points    Immunizations Immunization History  Administered Date(s) Administered   Fluzone Influenza virus vaccine,trivalent (IIV3), split virus 11/03/2022   Influenza Whole 11/03/2006, 11/01/2008   Influenza,inj,Quad PF,6+ Mos 10/04/2015, 10/12/2017, 10/21/2019, 10/26/2020, 11/04/2021    Influenza-Unspecified 10/20/2012, 10/29/2016, 11/10/2018   Moderna Sars-Covid-2 Vaccination 02/15/2019, 03/15/2019, 12/12/2019, 06/20/2020   PNEUMOCOCCAL CONJUGATE-20 11/25/2022   Pfizer Covid-19 Vaccine Bivalent Booster 64yrs & up 10/15/2020   Pneumococcal Polysaccharide-23 11/03/2006, 10/02/2014, 04/20/2020   Td 11/29/2001   Tdap 10/18/2012   Zoster Recombinant(Shingrix ) 10/12/2017, 12/15/2017   Zoster, Live 10/05/2009    Screening Tests Health Maintenance  Topic Date Due   Diabetic kidney evaluation - Urine ACR  10/20/2020   COVID-19 Vaccine (6 - 2024-25 season) 09/21/2022   DTaP/Tdap/Td (3 - Td or Tdap) 10/19/2022   FOOT EXAM  05/08/2023   INFLUENZA VACCINE  08/21/2023   Colonoscopy  09/10/2023   MAMMOGRAM  09/23/2023   HEMOGLOBIN A1C  11/05/2023   Diabetic kidney evaluation - eGFR measurement  05/05/2024   OPHTHALMOLOGY EXAM  07/27/2024   Medicare Annual Wellness (AWV)  08/30/2024   Pneumococcal Vaccine: 50+ Years  Completed   DEXA SCAN  Completed   Hepatitis C Screening  Completed   Zoster Vaccines- Shingrix   Completed   Hepatitis B Vaccines  Aged Out   HPV VACCINES  Aged Out   Meningococcal B Vaccine  Aged Out    Health Maintenance  Health Maintenance Due  Topic Date Due   Diabetic kidney evaluation - Urine ACR  10/20/2020   COVID-19 Vaccine (6 - 2024-25 season) 09/21/2022   DTaP/Tdap/Td (3 - Td or Tdap) 10/19/2022   FOOT EXAM  05/08/2023   INFLUENZA VACCINE  08/21/2023   Colonoscopy  09/10/2023   Health Maintenance Items Addressed: Ordered colonoscopy, Will get tetanus vaccine at pharmacy. Has mammogram scheduled with [facility].  Additional Screening:  Vision Screening: Recommended annual ophthalmology exams for early detection of glaucoma and other disorders of the eye. Would you like a referral to an eye doctor? No    Dental Screening: Recommended annual dental exams for proper oral hygiene  Community Resource Referral / Chronic Care Management: CRR  required this visit?  No   CCM required this visit?  No   Plan:    I have personally reviewed and noted the following in the patient's chart:   Medical and social history Use of alcohol, tobacco or illicit drugs  Current medications and supplements  including opioid prescriptions. Patient is not currently taking opioid prescriptions. Functional ability and status Nutritional status Physical activity Advanced directives List of other physicians Hospitalizations, surgeries, and ER visits in previous 12 months Vitals Screenings to include cognitive, depression, and falls Referrals and appointments  In addition, I have reviewed and discussed with patient certain preventive protocols, quality metrics, and best practice recommendations. A written personalized care plan for preventive services as well as general preventive health recommendations were provided to patient.   Lolita Libra, CMA   08/31/2023   After Visit Summary: (In Person-Printed) AVS printed and given to the patient  Notes: see phone note

## 2023-09-09 ENCOUNTER — Ambulatory Visit (INDEPENDENT_AMBULATORY_CARE_PROVIDER_SITE_OTHER): Admitting: Bariatrics

## 2023-09-09 ENCOUNTER — Encounter: Payer: Self-pay | Admitting: Bariatrics

## 2023-09-09 VITALS — BP 138/83 | HR 66 | Temp 97.9°F | Ht 61.0 in | Wt 135.0 lb

## 2023-09-09 DIAGNOSIS — Z6825 Body mass index (BMI) 25.0-25.9, adult: Secondary | ICD-10-CM

## 2023-09-09 DIAGNOSIS — I10 Essential (primary) hypertension: Secondary | ICD-10-CM

## 2023-09-09 DIAGNOSIS — E1151 Type 2 diabetes mellitus with diabetic peripheral angiopathy without gangrene: Secondary | ICD-10-CM

## 2023-09-09 DIAGNOSIS — Z7985 Long-term (current) use of injectable non-insulin antidiabetic drugs: Secondary | ICD-10-CM

## 2023-09-09 DIAGNOSIS — E669 Obesity, unspecified: Secondary | ICD-10-CM

## 2023-09-09 DIAGNOSIS — E119 Type 2 diabetes mellitus without complications: Secondary | ICD-10-CM

## 2023-09-09 MED ORDER — TIRZEPATIDE 15 MG/0.5ML ~~LOC~~ SOAJ: 15.0000 mg | SUBCUTANEOUS | 0 refills | Status: DC

## 2023-09-09 NOTE — Progress Notes (Signed)
 WEIGHT SUMMARY AND BIOMETRICS  Weight Lost Since Last Visit: 0  Weight Gained Since Last Visit: 2lb   Vitals Temp: 97.9 F (36.6 C) BP: 138/83 Pulse Rate: 66 SpO2: 98 %   Anthropometric Measurements Height: 5' 1 (1.549 m) Weight: 135 lb (61.2 kg) BMI (Calculated): 25.52 Weight at Last Visit: 133lb Weight Lost Since Last Visit: 0 Weight Gained Since Last Visit: 2lb Starting Weight: 163lb Total Weight Loss (lbs): 28 lb (12.7 kg)   Body Composition  Body Fat %: 33.8 % Fat Mass (lbs): 45.6 lbs Muscle Mass (lbs): 84.8 lbs Total Body Water (lbs): 59.4 lbs Visceral Fat Rating : 8   Other Clinical Data Fasting: no Labs: no Today's Visit #: 58 Starting Date: 05/13/20    OBESITY Nicole Richard is here to discuss her progress with her obesity treatment plan along with follow-up of her obesity related diagnoses.    Nutrition Plan: the Category 2 plan - 0% adherence.  Current exercise: none  Interim History:  She is up 2 lbs since her last visit. She states that she has not been following the plan. There have been 3 deaths in the family and she is eating out more.  Eating all of the food on the plan., Protein intake is as prescribed, Is not skipping meals, Journaling consistently., and Water intake is inadequate.   Pharmacotherapy: Nicole Richard is on Mounjaro  15 mg SQ weekly Adverse side effects: None Hunger is moderately controlled.  Cravings are moderately controlled.  Assessment/Plan:   Type II Diabetes HgbA1c is at goal. Last A1c was 5.9 CBGs: Not checking      Episodes of hypoglycemia: no Medication(s): Mounjaro  15 mg SQ weekly  Lab Results  Component Value Date   HGBA1C 5.9 (H) 05/06/2023   HGBA1C 5.6 10/28/2022   HGBA1C 6.1 (H) 04/01/2022   Lab Results  Component Value Date   MICROALBUR 0.6 10/21/2019   LDLCALC 86 05/06/2023   CREATININE 0.92  05/06/2023   Lab Results  Component Value Date   GFR 92.11 11/04/2021   GFR 62.29 10/26/2020   GFR 85.17 03/20/2020    Plan: Continue and refill Mounjaro  15 mg SQ weekly Continue all other medications.  Will keep all carbohydrates low both sweets and starches.  Will continue exercise regimen to 30 to 60 minutes on most days of the week.  Aim for 7 to 9 hours of sleep nightly.  Eat more low glycemic index foods.  She will get back on track.  She will increase her water.   Hypertension Hypertension stable.  Medication(s): Atenolol  25 mg  BP Readings from Last 3 Encounters:  09/09/23 138/83  08/31/23 124/69  08/05/23 126/69   Lab Results  Component Value Date   CREATININE 0.92 05/06/2023   CREATININE 0.85 11/25/2022   CREATININE 0.80 10/28/2022   Lab Results  Component Value Date   GFR 92.11 11/04/2021  GFR 62.29 10/26/2020   GFR 85.17 03/20/2020    Plan: Continue all antihypertensives at current dosages. No added salt. Will keep sodium content to 1,500 mg or less per day.      Generalized Obesity: Current BMI BMI (Calculated): 25.52   Pharmacotherapy Plan Continue and refill  Mounjaro  15 mg SQ weekly  Nicole Richard is currently in the action stage of change. As such, her goal is to continue with weight loss efforts.  She has agreed to the Category 2 plan.  Exercise goals: Older adults should determine their level of effort for physical activity relative to their level of fitness.   Behavioral modification strategies: increasing lean protein intake, no meal skipping, meal planning , increase water intake, better snacking choices, planning for success, increasing vegetables, avoiding temptations, and keep healthy foods in the home.  Nicole Richard has agreed to follow-up with our clinic in 4 weeks.    Objective:   VITALS: Per patient if applicable, see vitals. GENERAL: Alert and in no acute distress. CARDIOPULMONARY: No increased WOB. Speaking in clear sentences.   PSYCH: Pleasant and cooperative. Speech normal rate and rhythm. Affect is appropriate. Insight and judgement are appropriate. Attention is focused, linear, and appropriate.  NEURO: Oriented as arrived to appointment on time with no prompting.   Attestation Statements:   This was prepared with the assistance of Engineer, civil (consulting).  Occasional wrong-word or sound-a-like substitutions may have occurred due to the inherent limitations of voice recognition   Clayborne Daring, DO

## 2023-09-18 ENCOUNTER — Ambulatory Visit: Admitting: Family Medicine

## 2023-09-18 ENCOUNTER — Encounter: Payer: Self-pay | Admitting: Family Medicine

## 2023-09-18 VITALS — BP 120/68 | HR 78 | Temp 98.2°F | Resp 18 | Ht 61.0 in | Wt 139.0 lb

## 2023-09-18 DIAGNOSIS — E559 Vitamin D deficiency, unspecified: Secondary | ICD-10-CM | POA: Diagnosis not present

## 2023-09-18 DIAGNOSIS — J4520 Mild intermittent asthma, uncomplicated: Secondary | ICD-10-CM

## 2023-09-18 DIAGNOSIS — I10 Essential (primary) hypertension: Secondary | ICD-10-CM | POA: Diagnosis not present

## 2023-09-18 DIAGNOSIS — E1151 Type 2 diabetes mellitus with diabetic peripheral angiopathy without gangrene: Secondary | ICD-10-CM

## 2023-09-18 DIAGNOSIS — Z6824 Body mass index (BMI) 24.0-24.9, adult: Secondary | ICD-10-CM

## 2023-09-18 DIAGNOSIS — E785 Hyperlipidemia, unspecified: Secondary | ICD-10-CM

## 2023-09-18 DIAGNOSIS — K219 Gastro-esophageal reflux disease without esophagitis: Secondary | ICD-10-CM

## 2023-09-18 DIAGNOSIS — E1169 Type 2 diabetes mellitus with other specified complication: Secondary | ICD-10-CM

## 2023-09-18 DIAGNOSIS — K635 Polyp of colon: Secondary | ICD-10-CM

## 2023-09-18 DIAGNOSIS — J302 Other seasonal allergic rhinitis: Secondary | ICD-10-CM

## 2023-09-18 DIAGNOSIS — Z6826 Body mass index (BMI) 26.0-26.9, adult: Secondary | ICD-10-CM

## 2023-09-18 DIAGNOSIS — E2839 Other primary ovarian failure: Secondary | ICD-10-CM

## 2023-09-18 DIAGNOSIS — E1165 Type 2 diabetes mellitus with hyperglycemia: Secondary | ICD-10-CM

## 2023-09-18 DIAGNOSIS — Z7985 Long-term (current) use of injectable non-insulin antidiabetic drugs: Secondary | ICD-10-CM

## 2023-09-18 DIAGNOSIS — Z6825 Body mass index (BMI) 25.0-25.9, adult: Secondary | ICD-10-CM

## 2023-09-18 DIAGNOSIS — D229 Melanocytic nevi, unspecified: Secondary | ICD-10-CM

## 2023-09-18 LAB — LIPID PANEL
Cholesterol: 159 mg/dL (ref 0–200)
HDL: 67.3 mg/dL (ref >39.00)
LDL Cholesterol: 77 mg/dL (ref 0–99)
NonHDL: 92.16
Total CHOL/HDL Ratio: 2
Triglycerides: 77 mg/dL (ref 0.0–149.0)
VLDL: 15.4 mg/dL (ref 0.0–40.0)

## 2023-09-18 LAB — COMPREHENSIVE METABOLIC PANEL WITH GFR
ALT: 21 U/L (ref 0–35)
AST: 20 U/L (ref 0–37)
Albumin: 4.6 g/dL (ref 3.5–5.2)
Alkaline Phosphatase: 37 U/L — ABNORMAL LOW (ref 39–117)
BUN: 26 mg/dL — ABNORMAL HIGH (ref 6–23)
CO2: 29 meq/L (ref 19–32)
Calcium: 10.7 mg/dL — ABNORMAL HIGH (ref 8.4–10.5)
Chloride: 105 meq/L (ref 96–112)
Creatinine, Ser: 0.94 mg/dL (ref 0.40–1.20)
GFR: 63.38 mL/min (ref >60.00)
Glucose, Bld: 109 mg/dL — ABNORMAL HIGH (ref 70–99)
Potassium: 4.8 meq/L (ref 3.5–5.1)
Sodium: 144 meq/L (ref 135–145)
Total Bilirubin: 0.7 mg/dL (ref 0.2–1.2)
Total Protein: 7.3 g/dL (ref 6.0–8.3)

## 2023-09-18 LAB — CBC WITH DIFFERENTIAL/PLATELET
Basophils Absolute: 0 K/uL (ref 0.0–0.1)
Basophils Relative: 0.6 % (ref 0.0–3.0)
Eosinophils Absolute: 0.2 K/uL (ref 0.0–0.7)
Eosinophils Relative: 3 % (ref 0.0–5.0)
HCT: 42.6 % (ref 36.0–46.0)
Hemoglobin: 14.1 g/dL (ref 12.0–15.0)
Lymphocytes Relative: 29.1 % (ref 12.0–46.0)
Lymphs Abs: 1.7 K/uL (ref 0.7–4.0)
MCHC: 33 g/dL (ref 30.0–36.0)
MCV: 89.6 fl (ref 78.0–100.0)
Monocytes Absolute: 0.4 K/uL (ref 0.1–1.0)
Monocytes Relative: 7.7 % (ref 3.0–12.0)
Neutro Abs: 3.5 K/uL (ref 1.4–7.7)
Neutrophils Relative %: 59.6 % (ref 43.0–77.0)
Platelets: 327 K/uL (ref 150.0–400.0)
RBC: 4.76 Mil/uL (ref 3.87–5.11)
RDW: 12.9 % (ref 11.5–15.5)
WBC: 5.8 K/uL (ref 4.0–10.5)

## 2023-09-18 LAB — VITAMIN D 25 HYDROXY (VIT D DEFICIENCY, FRACTURES): VITD: 80.3 ng/mL (ref 30.00–100.00)

## 2023-09-18 LAB — MICROALBUMIN / CREATININE URINE RATIO
Creatinine,U: 124.7 mg/dL
Microalb Creat Ratio: 9.1 mg/g (ref 0.0–30.0)
Microalb, Ur: 1.1 mg/dL (ref 0.0–1.9)

## 2023-09-18 LAB — HEMOGLOBIN A1C: Hgb A1c MFr Bld: 6.2 % (ref 4.6–6.5)

## 2023-09-18 MED ORDER — ALENDRONATE SODIUM 70 MG PO TABS: 70.0000 mg | ORAL_TABLET | ORAL | 3 refills | Status: AC

## 2023-09-18 MED ORDER — ATORVASTATIN CALCIUM 40 MG PO TABS: 40.0000 mg | ORAL_TABLET | Freq: Every day | ORAL | 1 refills | Status: AC

## 2023-09-18 MED ORDER — FREESTYLE LIBRE 14 DAY SENSOR MISC: 1.0000 | 3 refills | Status: AC

## 2023-09-18 MED ORDER — MONTELUKAST SODIUM 10 MG PO TABS
10.0000 mg | ORAL_TABLET | Freq: Every day | ORAL | 1 refills | Status: AC
Start: 2023-09-18 — End: ?

## 2023-09-18 MED ORDER — PANTOPRAZOLE SODIUM 40 MG PO TBEC: 40.0000 mg | DELAYED_RELEASE_TABLET | Freq: Two times a day (BID) | ORAL | 1 refills | Status: AC

## 2023-09-18 MED ORDER — FENOFIBRATE 160 MG PO TABS: 160.0000 mg | ORAL_TABLET | Freq: Every day | ORAL | 1 refills | Status: AC

## 2023-09-18 MED ORDER — VITAMIN D-3 25 MCG (1000 UT) PO CAPS: 1.0000 | ORAL_CAPSULE | Freq: Every day | ORAL | 3 refills | Status: AC

## 2023-09-18 MED ORDER — ATENOLOL 25 MG PO TABS: 25.0000 mg | ORAL_TABLET | Freq: Every day | ORAL | 1 refills | Status: AC

## 2023-09-18 NOTE — Assessment & Plan Note (Signed)
 Well controlled, no changes to meds. Encouraged heart healthy diet such as the DASH diet and exercise as tolerated.

## 2023-09-18 NOTE — Assessment & Plan Note (Signed)
 STABLE HAS NOT NEEDED INHALERS IN YEARS SINCE LOSING WEIGHT

## 2023-09-18 NOTE — Assessment & Plan Note (Signed)
 Bmi  28--- has done well with mounjaro  and healthy weight and wellness Is getting ready to transition off HWW and would like us  to take over dm

## 2023-09-18 NOTE — Assessment & Plan Note (Signed)
 Tolerating statin, encouraged heart healthy diet, avoid trans fats, minimize simple carbs and saturated fats. Increase exercise as tolerated

## 2023-09-18 NOTE — Assessment & Plan Note (Signed)
 STABLE  ON PANTOPRAZOLE 

## 2023-09-18 NOTE — Progress Notes (Signed)
 +-----  Subjective:    Patient ID: Nicole Richard, female    DOB: [birth year], 68 y.o.   MRN: [id number]  Chief Complaint  Patient presents with   Diabetes   Follow-up    HPI Patient is in today for f/u.   Discussed the use of AI scribe software for clinical note transcription with the patient, who gave verbal consent to proceed.  History of Present Illness Nicole Richard is a 68 year old female with diabetes who presents for management of her diabetes and medication refills.  Her diabetes has been stable with the use of Mounjaro  and bromocriptine . Blood glucose levels range from 80 to 150 mg/dL, with postprandial increases and a fasting level of 131 mg/dL this morning after consuming tuna and pasta the previous night. She is contemplating switching from continuous glucose monitoring with Freestyle Libre to finger sticks due to cost concerns, as Medicare does not cover the full expense.  She is seeking refills for several medications, including atenolol  25 mg for blood pressure, Singulair , bromocriptine , fenofibrate , and pantoprazole . She has not used her inhalers recently due to weight loss and improved respiratory symptoms, and she does not require refills for them at this time.  She is due for a colonoscopy, as it has been five years since her last one, during which polyps were found. Her family history includes colon cancer in her grandmother and breast cancer in her mother.  Her father, who is 36 and diabetic, lives in [place] and is not managing his health well, though he is diligent about checking his blood sugar and administering insulin . She reports recent family deaths, including her father-in-law and aunt, both of whom were elderly.  She reports that she has lost weight and is able to wear heels occasionally. She is due for a tetanus shot and inquires about her pneumonia vaccination status, which is up to date.    Past Medical History:  Diagnosis Date   Abdominal pain, left  upper quadrant 03/01/2013   Abnormal liver function tests 03/01/2013   Allergy    seasonal   Angina at rest [facility]) 03/21/2020   Angina pectoris (HCC) 03/21/2020   Asthma    Back pain    BREAST IMPLANTS, BILATERAL, HX OF 07/28/2007   Qualifier: Diagnosis of  By: Antonio ROSALEA Rockers     CYSTOCELE WITH INCOMPLETE UTERINE PROLAPSE 05/18/2006   Qualifier: Diagnosis of  By: Antonio ROSALEA Rockers     Diabetes (HCC) 01/29/2014   Diabetes mellitus without complication (HCC)    Edema of both lower extremities    ESOPHAGEAL STRICTURE 11/09/2009   Qualifier: Diagnosis of  By: Bettie CMA (AAMA), [name]     Essential hypertension 05/18/2006   Qualifier: Diagnosis of  By: Antonio ROSALEA Rockers     Extrinsic asthma with exacerbation, unspecified asthma severity 10/26/2020   GERD 09/14/2009   Qualifier: Diagnosis of  By: Antonio ROSALEA Rockers     GERD (gastroesophageal reflux disease)    HEADACHE 05/18/2006   Qualifier: Diagnosis of  By: Antonio ROSALEA Rockers     Hirsutism 05/18/2006   Qualifier: Diagnosis of  By: Antonio ROSALEA Rockers     Hyperlipidemia    Hyperlipidemia associated with type 2 diabetes mellitus (HCC) 04/15/2019   Hypertension    Kidney stones    LAPAROSCOPY, HX OF 05/18/2006   Qualifier: Diagnosis of  By: Antonio ROSALEA Rockers     Mixed dyslipidemia 06/22/2020   Nonspecific (abnormal) findings on radiological and other examination of biliary tract 03/01/2013  Obesity (BMI 30-39.9) 10/18/2012   Other chest pain 03/21/2020   Palpitation 03/21/2020   Palpitations    POSTMENOPAUSAL STATUS 09/14/2009   Qualifier: Diagnosis of  By: Antonio ROSALEA Rockers     Preventative health care 10/26/2020   Seasonal allergic rhinitis 10/26/2020   SKIN TAG 10/05/2009   Qualifier: Diagnosis of  By: Antonio ROSALEA Rockers     SOB (shortness of breath)    Uncontrolled type 2 diabetes mellitus with hyperglycemia (HCC) 04/15/2019   Vitamin D  deficiency    Vitamin D  insufficiency 05/21/2020    Past Surgical History:  Procedure Laterality Date   ABDOMINAL  HYSTERECTOMY  08/2004   APPENDECTOMY     AUGMENTATION MAMMAPLASTY Bilateral 2000   BREAST ENHANCEMENT SURGERY  2001   COLONOSCOPY     LEFT HEART CATH AND CORONARY ANGIOGRAPHY N/A 04/09/2020   Procedure: LEFT HEART CATH AND CORONARY ANGIOGRAPHY;  Surgeon: Anner Alm ORN, MD;  Location: [facility] INVASIVE CV LAB;  Service: Cardiovascular;  Laterality: N/A;   POLYPECTOMY  2009   sigmoid polyps   SHOULDER ARTHROSCOPY W/ ROTATOR CUFF REPAIR Right 06/06/2015   [facility]   TONSILLECTOMY     TUBAL LIGATION      Family History  Problem Relation Age of Onset   Cancer Mother    Rheumatic fever Mother    Irritable bowel syndrome Mother    Breast cancer Mother    Diabetes Mother    High blood pressure Mother    High Cholesterol Mother    Heart disease Mother    Stroke Mother    Sudden death Mother    Obesity Mother    Diabetes Father    Colon polyps Father    Kidney disease Father    High blood pressure Father    High Cholesterol Father    Heart disease Father    Obesity Father    Macular degeneration Father    CAD Father    Hyperlipidemia Sister    Hypertension Sister    Stroke Sister    Heart disease Brother    Heart disease Brother    Hypothyroidism Brother    Cervical cancer Maternal Grandmother    Breast cancer Maternal Grandmother    Colon cancer Maternal Grandmother    Uterine cancer Maternal Grandmother    Coronary artery disease Other    Hyperlipidemia Other    Hypertension Other    Asthma Other    Esophageal cancer Neg Hx    Rectal cancer Neg Hx    Stomach cancer Neg Hx     Social History   Socioeconomic History   Marital status: Divorced    Spouse name: Not on file   Number of children: 2   Years of education: Not on file   Highest education level: Some college, no degree  Occupational History   Occupation: Nurse    Comment: [email or web address]  Tobacco Use   Smoking status: Never   Smokeless tobacco: Never  Vaping Use   Vaping status: Never Used  Substance and  Sexual Activity   Alcohol use: Yes    Comment: rare   Drug use: No   Sexual activity: Not Currently    Partners: Male  Other Topics Concern   Not on file  Social History Narrative   Exercise--walking , and pt daily   Social Drivers of Health   Financial Resource Strain: Low Risk  (08/24/2023)   Overall Financial Resource Strain (CARDIA)    Difficulty of Paying Living Expenses: Not very hard  Food  Insecurity: No Food Insecurity (08/31/2023)   Hunger Vital Sign    Worried About Running Out of Food in the Last Year: Never true    Ran Out of Food in the Last Year: Never true  Transportation Needs: No Transportation Needs (08/24/2023)   PRAPARE - Administrator, Civil Service (Medical): No    Lack of Transportation (Non-Medical): No  Physical Activity: Insufficiently Active (08/31/2023)   Exercise Vital Sign    Days of Exercise per Week: 3 days    Minutes of Exercise per Session: 20 min  Stress: No Stress Concern Present (08/24/2023)   [employer] of Occupational Health - Occupational Stress Questionnaire    Feeling of Stress: Not at all  Social Connections: Moderately Isolated (08/24/2023)   Social Connection and Isolation Panel    Frequency of Communication with Friends and Family: More than three times a week    Frequency of Social Gatherings with Friends and Family: More than three times a week    Attends Religious Services: More than 4 times per year    Active Member of [employer] or Organizations: No    Attends Banker Meetings: Not on file    Marital Status: Divorced  Intimate Partner Violence: Not At Risk (08/31/2023)   Humiliation, Afraid, Rape, and Kick questionnaire    Fear of Current or Ex-Partner: No    Emotionally Abused: No    Physically Abused: No    Sexually Abused: No    Outpatient Medications Prior to Visit  Medication Sig Dispense Refill   aspirin  81 MG chewable tablet Chew 81 mg by mouth at bedtime.     bromocriptine  (PARLODEL ) 2.5 MG  tablet TAKE 0.5 TABLETS BY MOUTH DAILY. 45 tablet 3   Calcium  Carbonate-Vitamin D  (OSCAL 500/200 D-3 PO) Take 1 tablet by mouth at bedtime.     Ipratropium-Albuterol  (COMBIVENT  RESPIMAT) 20-100 MCG/ACT AERS respimat Inhale 1 puff into the lungs 4 (four) times daily as needed for wheezing or shortness of breath. 4 g 5   mometasone  (NASONEX ) 50 MCG/ACT nasal spray Place 2 sprays into the nose daily.     nitroGLYCERIN  (NITROSTAT ) 0.4 MG SL tablet Place 1 tablet (0.4 mg total) under the tongue every 5 (five) minutes as needed for chest pain. 1rst attempt, patient needs an appt for additional refills 15 tablet 0   Propylene Glycol 0.6 % SOLN Apply 1 drop to eye 4 (four) times daily as needed for dry eyes (dry/irritated eyes).     tirzepatide  (MOUNJARO ) 15 MG/0.5ML Pen Inject 15 mg into the skin once a week. 6 mL 0   Vitamin D , Ergocalciferol , (DRISDOL ) 1.25 MG (50000 UNIT) CAPS capsule TAKE ONE CAPSULE BY MOUTH EVERY 7 DAYS 12 capsule 0   alendronate  (FOSAMAX ) 70 MG tablet Take 1 tablet (70 mg total) by mouth every 7 (seven) days. Take with a full glass of water on an empty stomach. 12 tablet 3   atenolol  (TENORMIN ) 25 MG tablet Take 1 tablet (25 mg total) by mouth daily. Needs appt 30 tablet 0   atorvastatin  (LIPITOR) 40 MG tablet TAKE 1 TABLET BY MOUTH EVERYDAY AT BEDTIME 90 tablet 1   Cholecalciferol (VITAMIN D -3) 25 MCG (1000 UT) CAPS Take 1 capsule by mouth daily.     Continuous Glucose Sensor (FREESTYLE LIBRE 14 DAY SENSOR) MISC 1 each by Does not apply route every 14 (fourteen) days. 6 each 3   fenofibrate  160 MG tablet Take 1 tablet (160 mg total) by mouth daily. 90 tablet  1   montelukast  (SINGULAIR ) 10 MG tablet Take 1 tablet (10 mg total) by mouth daily. 90 tablet 1   pantoprazole  (PROTONIX ) 40 MG tablet Take 1 tablet (40 mg total) by mouth 2 (two) times daily. 180 tablet 1   No facility-administered medications prior to visit.    Allergies  Allergen Reactions   Contrast Media [Iodinated  Contrast Media] Anaphylaxis   Metamucil [Psyllium] Anaphylaxis   Mustard Anaphylaxis    Review of Systems  Constitutional:  Negative for fever and malaise/fatigue.  HENT:  Negative for congestion.   Eyes:  Negative for blurred vision.  Respiratory:  Negative for shortness of breath.   Cardiovascular:  Negative for chest pain, palpitations and leg swelling.  Gastrointestinal:  Negative for abdominal pain, blood in stool and nausea.  Genitourinary:  Negative for dysuria and frequency.  Musculoskeletal:  Negative for falls.  Skin:  Negative for rash.  Neurological:  Negative for dizziness, loss of consciousness and headaches.  Endo/Heme/Allergies:  Negative for environmental allergies.  Psychiatric/Behavioral:  Negative for depression. The patient is not nervous/anxious.        Objective:    Physical Exam Vitals and nursing note reviewed.  Constitutional:      General: She is not in acute distress.    Appearance: Normal appearance. She is well-developed.  HENT:     Head: Normocephalic and atraumatic.  Eyes:     General: No scleral icterus.       Right eye: No discharge.        Left eye: No discharge.  Cardiovascular:     Rate and Rhythm: Normal rate and regular rhythm.     Heart sounds: No murmur heard. Pulmonary:     Effort: Pulmonary effort is normal. No respiratory distress.     Breath sounds: Normal breath sounds.  Musculoskeletal:        General: Normal range of motion.     Cervical back: Normal range of motion and neck supple.     Right lower leg: No edema.     Left lower leg: No edema.  Skin:    General: Skin is warm and dry.  Neurological:     Mental Status: She is alert and oriented to person, place, and time.  Psychiatric:        Mood and Affect: Mood normal.        Behavior: Behavior normal.        Thought Content: Thought content normal.        Judgment: Judgment normal.     BP 120/68 (BP Location: Left Arm, Patient Position: Sitting, Cuff Size:  Normal)   Pulse 78   Temp 98.2 F (36.8 C) (Oral)   Resp 18   Ht 5' 1 (1.549 m)   Wt 139 lb (63 kg)   SpO2 97%   BMI 26.26 kg/m  Wt Readings from Last 3 Encounters:  09/18/23 139 lb (63 kg)  09/09/23 135 lb (61.2 kg)  08/31/23 140 lb (63.5 kg)    Diabetic Foot Exam - Simple   Simple Foot Form Diabetic Foot exam was performed with the following findings: Yes 09/18/2023  8:35 AM  Visual Inspection No deformities, no ulcerations, no other skin breakdown bilaterally: Yes Sensation Testing Intact to touch and monofilament testing bilaterally: Yes Pulse Check Posterior Tibialis and Dorsalis pulse intact bilaterally: Yes Comments    Lab Results  Component Value Date   WBC 7.1 11/25/2022   HGB 14.3 11/25/2022   HCT 43.4 11/25/2022   PLT  334.0 11/25/2022   GLUCOSE 89 05/06/2023   CHOL 160 05/06/2023   TRIG 55 05/06/2023   HDL 63 05/06/2023   LDLDIRECT 128.1 10/05/2013   LDLCALC 86 05/06/2023   ALT 31 05/06/2023   AST 28 05/06/2023   NA 143 05/06/2023   K 4.8 05/06/2023   CL 106 05/06/2023   CREATININE 0.92 05/06/2023   BUN 21 05/06/2023   CO2 24 05/06/2023   TSH 0.59 11/04/2021   HGBA1C 5.9 (H) 05/06/2023   MICROALBUR 0.6 10/21/2019    Lab Results  Component Value Date   TSH 0.59 11/04/2021   Lab Results  Component Value Date   WBC 7.1 11/25/2022   HGB 14.3 11/25/2022   HCT 43.4 11/25/2022   MCV 90.1 11/25/2022   PLT 334.0 11/25/2022   Lab Results  Component Value Date   NA 143 05/06/2023   K 4.8 05/06/2023   CO2 24 05/06/2023   GLUCOSE 89 05/06/2023   BUN 21 05/06/2023   CREATININE 0.92 05/06/2023   BILITOT 0.7 05/06/2023   ALKPHOS 44 05/06/2023   AST 28 05/06/2023   ALT 31 05/06/2023   PROT 6.8 05/06/2023   ALBUMIN 4.4 05/06/2023   CALCIUM  10.3 05/06/2023   EGFR 69 05/06/2023   GFR 92.11 11/04/2021   Lab Results  Component Value Date   CHOL 160 05/06/2023   Lab Results  Component Value Date   HDL 63 05/06/2023   Lab Results   Component Value Date   LDLCALC 86 05/06/2023   Lab Results  Component Value Date   TRIG 55 05/06/2023   Lab Results  Component Value Date   CHOLHDL 2.6 11/25/2022   Lab Results  Component Value Date   HGBA1C 5.9 (H) 05/06/2023       Assessment & Plan:  Type 2 diabetes mellitus with diabetic peripheral angiopathy without gangrene, without long-term current use of insulin  (HCC) -     Comprehensive metabolic panel with GFR -     Hemoglobin A1c -     Microalbumin / creatinine urine ratio  Essential (primary) hypertension -     Lipid panel -     CBC with Differential/Platelet -     Comprehensive metabolic panel with GFR -     Microalbumin / creatinine urine ratio -     Atenolol ; Take 1 tablet (25 mg total) by mouth daily. Needs appt  Dispense: 90 tablet; Refill: 1  BMI 25.0-25.9,adult  Vitamin D  deficiency -     VITAMIN D  25 Hydroxy (Vit-D Deficiency, Fractures) -     Vitamin D -3; Take 1 capsule (1,000 Units total) by mouth daily.  Dispense: 90 capsule; Refill: 3  Hyperlipidemia associated with type 2 diabetes mellitus (HCC) Assessment & Plan: Tolerating statin, encouraged heart healthy diet, avoid trans fats, minimize simple carbs and saturated fats. Increase exercise as tolerated   Orders: -     Lipid panel -     CBC with Differential/Platelet -     Fenofibrate ; Take 1 tablet (160 mg total) by mouth daily.  Dispense: 90 tablet; Refill: 1  BMI 24.0-24.9, adult  Seasonal allergic rhinitis -     Montelukast  Sodium; Take 1 tablet (10 mg total) by mouth daily.  Dispense: 90 tablet; Refill: 1  Gastroesophageal reflux disease, unspecified whether esophagitis present -     Pantoprazole  Sodium; Take 1 tablet (40 mg total) by mouth 2 (two) times daily.  Dispense: 180 tablet; Refill: 1  Type 2 diabetes mellitus with other specified complication (HCC) -  Atorvastatin  Calcium ; Take 1 tablet (40 mg total) by mouth daily.  Dispense: 90 tablet; Refill: 1  Estrogen  deficiency -     Alendronate  Sodium; Take 1 tablet (70 mg total) by mouth every 7 (seven) days. Take with a full glass of water on an empty stomach.  Dispense: 12 tablet; Refill: 3  Type 2 diabetes mellitus with hyperglycemia, without long-term current use of insulin  (HCC) -     FreeStyle Libre 14 Day Sensor; 1 each by Does not apply route every 14 (fourteen) days.  Dispense: 6 each; Refill: 3  Polyp of colon, unspecified part of colon, unspecified type -     Ambulatory referral to Gastroenterology  Nevus of multiple sites -     Ambulatory referral to Dermatology  Mild intermittent asthma, unspecified whether complicated Assessment & Plan: STABLE HAS NOT NEEDED INHALERS IN YEARS SINCE LOSING WEIGHT    Essential hypertension Assessment & Plan: Well controlled, no changes to meds. Encouraged heart healthy diet such as the DASH diet and exercise as tolerated.     Morbid obesity (HCC) Assessment & Plan: Bmi  26--- has done well with mounjaro  and healthy weight and wellness Is getting ready to transition off HWW and would like us  to take over dm   Assessment and Plan Assessment & Plan Type 2 diabetes mellitus   Type 2 diabetes mellitus is well-managed with Mounjaro  and bromocriptine . Blood glucose levels range from 80 to 150 mg/dL, with postprandial elevations. Morning glucose was 131 mg/dL. She is considering switching from [facility] to finger sticks due to cost concerns but prefers to avoid finger sticks due to pain and compliance issues. Discussed potential cost-saving options, including over-the-counter Libre and Relion glucometer from [employer]. Continue Mounjaro  and bromocriptine . Consider switching to finger sticks if the cost of Freestyle Herlene remains prohibitive. Investigate over-the-counter Libre and Relion glucometer options. Provide prescription for glucometer if needed.  Essential hypertension   Essential hypertension is managed with atenolol . Prescribe atenolol  25  mg.  Gastroesophageal reflux disease (GERD)   GERD is managed with pantoprazole . Refill pantoprazole .  Vitamin D  deficiency   Vitamin D  levels were low despite vitamin D2 and D3 supplementation, leading to initiation of Fosamax . Check vitamin D2 levels. Continue Fosamax  and vitamin D3 supplementation.  Polyp of colon, history of   History of colon polyps requires colonoscopy every 5 years. Family history of colon cancer in grandmother. Schedule colonoscopy.  Melanocytic nevus, unspecified site   Melanocytic nevus with changes in texture, becoming rougher but not larger. No history of skin cancer. Previous dermatologist retired, requiring new referral. Refer to dermatology for evaluation of melanocytic nevus.  General Health Maintenance   Due for tetanus vaccination. Pneumonia vaccination is up to date with the 20-valent vaccine received in 2024. Obtain tetanus vaccination at pharmacy.    Charlett Merkle R Lowne Chase, DO

## 2023-09-21 ENCOUNTER — Ambulatory Visit: Payer: Self-pay | Admitting: Family Medicine

## 2023-09-22 ENCOUNTER — Other Ambulatory Visit: Payer: Self-pay | Admitting: Family Medicine

## 2023-09-22 DIAGNOSIS — Z1231 Encounter for screening mammogram for malignant neoplasm of breast: Secondary | ICD-10-CM

## 2023-09-24 ENCOUNTER — Ambulatory Visit
Admission: RE | Admit: 2023-09-24 | Discharge: 2023-09-24 | Disposition: A | Discharge status: Home / Self Care | Source: Ambulatory Visit | Attending: Family Medicine | Admitting: Family Medicine

## 2023-09-24 DIAGNOSIS — Z1231 Encounter for screening mammogram for malignant neoplasm of breast: Secondary | ICD-10-CM

## 2023-09-30 ENCOUNTER — Other Ambulatory Visit: Payer: Self-pay | Admitting: Bariatrics

## 2023-09-30 DIAGNOSIS — E559 Vitamin D deficiency, unspecified: Secondary | ICD-10-CM

## 2023-10-14 ENCOUNTER — Ambulatory Visit (INDEPENDENT_AMBULATORY_CARE_PROVIDER_SITE_OTHER): Admitting: Bariatrics

## 2023-10-14 ENCOUNTER — Encounter: Payer: Self-pay | Admitting: Bariatrics

## 2023-10-14 VITALS — BP 115/70 | HR 68 | Temp 98.2°F | Ht 61.0 in | Wt 137.0 lb

## 2023-10-14 DIAGNOSIS — E1151 Type 2 diabetes mellitus with diabetic peripheral angiopathy without gangrene: Secondary | ICD-10-CM

## 2023-10-14 DIAGNOSIS — E669 Obesity, unspecified: Secondary | ICD-10-CM

## 2023-10-14 DIAGNOSIS — Z6825 Body mass index (BMI) 25.0-25.9, adult: Secondary | ICD-10-CM | POA: Diagnosis not present

## 2023-10-14 DIAGNOSIS — Z6826 Body mass index (BMI) 26.0-26.9, adult: Secondary | ICD-10-CM

## 2023-10-14 DIAGNOSIS — E119 Type 2 diabetes mellitus without complications: Secondary | ICD-10-CM

## 2023-10-14 DIAGNOSIS — Z7985 Long-term (current) use of injectable non-insulin antidiabetic drugs: Secondary | ICD-10-CM

## 2023-10-14 NOTE — Progress Notes (Signed)
 WEIGHT SUMMARY AND BIOMETRICS  Weight Lost Since Last Visit: 0  Weight Gained Since Last Visit: 2lb   Vitals Temp: 98.2 F (36.8 C) BP: 115/70 Pulse Rate: 68 SpO2: 98 %   Anthropometric Measurements Height: 5' 1 (1.549 m) Weight: 137 lb (62.1 kg) BMI (Calculated): 25.9 Weight at Last Visit: 135lb Weight Lost Since Last Visit: 0 Weight Gained Since Last Visit: 2lb Starting Weight: 163lb Total Weight Loss (lbs): 26 lb (11.8 kg)   Body Composition  Body Fat %: 35.2 % Fat Mass (lbs): 48.4 lbs Muscle Mass (lbs): 84.4 lbs Total Body Water (lbs): 61.4 lbs Visceral Fat Rating : 9   Other Clinical Data Fasting: no Labs: no Today's Visit #: 40 Starting Date: 05/13/20    OBESITY Nicole Richard is here to discuss her progress with her obesity treatment plan along with follow-up of her obesity related diagnoses.    Nutrition Plan: the Category 2 plan - 80% adherence.  Current exercise: walking  Interim History:  She is up 2 lbs since her last visit. She states that she needs drink more water.  Eating all of the food on the plan., Protein intake is as prescribed, Is not skipping meals, Water intake is adequate., and Reports polyphagia   Pharmacotherapy: Nicole Richard is on Mounjaro  15 mg SQ weekly Adverse side effects: None Hunger is moderately controlled.  Cravings are moderately controlled.  Assessment/Plan:   Type II Diabetes HgbA1c is at goal. Last A1c was 6.2 CBGs: Fasting 80 to 110      Episodes of hypoglycemia: no Medication(s): Mounjaro  15 mg SQ weekly  Lab Results  Component Value Date   HGBA1C 6.2 09/18/2023   HGBA1C 5.9 (H) 05/06/2023   HGBA1C 5.6 10/28/2022   Lab Results  Component Value Date   MICROALBUR 1.1 09/18/2023   LDLCALC 77 09/18/2023   CREATININE 0.94 09/18/2023   Lab Results  Component Value Date   GFR 63.38 09/18/2023   GFR  92.11 11/04/2021   GFR 62.29 10/26/2020    Plan: Continue and refill Mounjaro  15 mg SQ weekly Continue all other medications.  Will keep all carbohydrates low both sweets and starches.  Will continue exercise regimen to 30 to 60 minutes on most days of the week.  Aim for 7 to 9 hours of sleep nightly.  Eat more low glycemic index foods.  Will do more meal planning.  Will do journaling as needed.      Generalized Obesity: Current BMI BMI (Calculated): 25.9   Pharmacotherapy Plan Continue  Mounjaro  15 mg SQ weekly  Nicole Richard is currently in the action stage of change. As such, her goal is to continue with weight loss efforts.  She has agreed to the Category 2 plan.  Exercise goals: Older adults should do exercises that maintain or improve balance if they are at risk of falling.   Behavioral modification strategies: increasing lean  protein intake, no meal skipping, decrease eating out, meal planning , increase water intake, better snacking choices, planning for success, and increasing vegetables.  Nicole Richard has agreed to follow-up with our clinic in 4 weeks.     Objective:   VITALS: Per patient if applicable, see vitals. GENERAL: Alert and in no acute distress. CARDIOPULMONARY: No increased WOB. Speaking in clear sentences.  PSYCH: Pleasant and cooperative. Speech normal rate and rhythm. Affect is appropriate. Insight and judgement are appropriate. Attention is focused, linear, and appropriate.  NEURO: Oriented as arrived to appointment on time with no prompting.   Attestation Statements:   This was prepared with the assistance of Engineer, civil (consulting).  Occasional wrong-word or sound-a-like substitutions may have occurred due to the inherent limitations of voice recognition   Nicole Daring, DO

## 2023-11-12 ENCOUNTER — Ambulatory Visit: Admitting: Bariatrics

## 2023-11-17 ENCOUNTER — Other Ambulatory Visit: Payer: Self-pay | Admitting: Family Medicine

## 2023-11-17 DIAGNOSIS — E559 Vitamin D deficiency, unspecified: Secondary | ICD-10-CM

## 2023-12-21 ENCOUNTER — Ambulatory Visit: Admitting: Bariatrics

## 2023-12-30 ENCOUNTER — Ambulatory Visit (INDEPENDENT_AMBULATORY_CARE_PROVIDER_SITE_OTHER): Admitting: Bariatrics

## 2023-12-30 ENCOUNTER — Encounter: Payer: Self-pay | Admitting: Bariatrics

## 2023-12-30 VITALS — BP 138/74 | HR 77 | Ht 61.0 in | Wt 138.0 lb

## 2023-12-30 DIAGNOSIS — I10 Essential (primary) hypertension: Secondary | ICD-10-CM

## 2023-12-30 DIAGNOSIS — E119 Type 2 diabetes mellitus without complications: Secondary | ICD-10-CM

## 2023-12-30 DIAGNOSIS — Z6826 Body mass index (BMI) 26.0-26.9, adult: Secondary | ICD-10-CM

## 2023-12-30 DIAGNOSIS — Z7985 Long-term (current) use of injectable non-insulin antidiabetic drugs: Secondary | ICD-10-CM | POA: Diagnosis not present

## 2023-12-30 DIAGNOSIS — E669 Obesity, unspecified: Secondary | ICD-10-CM

## 2023-12-30 DIAGNOSIS — E1151 Type 2 diabetes mellitus with diabetic peripheral angiopathy without gangrene: Secondary | ICD-10-CM

## 2023-12-30 MED ORDER — TIRZEPATIDE 15 MG/0.5ML ~~LOC~~ SOAJ: 15.0000 mg | SUBCUTANEOUS | 0 refills | Status: AC

## 2023-12-30 NOTE — Progress Notes (Signed)
 WEIGHT SUMMARY AND BIOMETRICS  Weight Lost Since Last Visit: 0  Weight Gained Since Last Visit: 1lb   Vitals BP: 138/74 Pulse Rate: 77 SpO2: 98 %   Anthropometric Measurements Height: 5' 1 (1.549 m) Weight: 138 lb (62.6 kg) BMI (Calculated): 26.09 Weight at Last Visit: 137lb Weight Lost Since Last Visit: 0 Weight Gained Since Last Visit: 1lb Starting Weight: 163lb Total Weight Loss (lbs): 25 lb (11.3 kg)   Body Composition  Body Fat %: 36.4 % Fat Mass (lbs): 50.4 lbs Muscle Mass (lbs): 83.6 lbs Total Body Water (lbs): 60.2 lbs Visceral Fat Rating : 9   Other Clinical Data Fasting: yes Labs: no Today's Visit #: 26 Starting Date: 05/13/20    OBESITY Naamah is here to discuss her progress with her obesity treatment plan along with follow-up of her obesity related diagnoses.    Nutrition Plan: the Category 2 plan - 50% adherence.  Current exercise: Home exercises.  Interim History:  She is up 1 lb since her last visit.  Eating all of the food on the plan., Protein intake is as prescribed, Not journaling consistently., and Water intake is adequate.   Pharmacotherapy: Ivania is on Mounjaro  15 mg SQ weekly Adverse side effects: None Hunger is moderately controlled.  Cravings are moderately controlled.  Assessment/Plan:   Type II Diabetes HgbA1c is at goal. Last A1c was 6.2 CBGs: Not checking      Episodes of hypoglycemia: no Medication(s): Mounjaro  15 mg SQ weekly  Lab Results  Component Value Date   HGBA1C 6.2 09/18/2023   HGBA1C 5.9 (H) 05/06/2023   HGBA1C 5.6 10/28/2022   Lab Results  Component Value Date   MICROALBUR 1.1 09/18/2023   LDLCALC 77 09/18/2023   CREATININE 0.94 09/18/2023   Lab Results  Component Value Date   GFR 63.38 09/18/2023   GFR 92.11 11/04/2021   GFR 62.29 10/26/2020    Plan: Continue and refill  Mounjaro  15 mg SQ weekly Continue all other medications.  Will keep all carbohydrates low both sweets and starches.  Will continue exercise regimen to 30 to 60 minutes on most days of the week.  Aim for 7 to 9 hours of sleep nightly.  Eat more low glycemic index foods.   Hypertension Hypertension stable.  Medication(s): Atenolol  25 mg  BP Readings from Last 3 Encounters:  12/30/23 138/74  10/14/23 115/70  09/18/23 120/68   Lab Results  Component Value Date   CREATININE 0.94 09/18/2023   CREATININE 0.92 05/06/2023   CREATININE 0.85 11/25/2022   Lab Results  Component Value Date   GFR 63.38 09/18/2023   GFR 92.11 11/04/2021   GFR 62.29 10/26/2020    Plan: Continue all antihypertensives at current dosages. No added salt. Will keep sodium content to 1,500 mg or less per day.     Generalized Obesity: Current BMI BMI (Calculated): 26.09  Pharmacotherapy Plan Continue and refill  Mounjaro  15 mg SQ weekly  Malana is currently in the action stage of change. As such, her goal is to continue with weight loss efforts.  She has agreed to the Category 2 plan.  Exercise goals: Older adults should determine their level of effort for physical activity relative to their level of fitness.  She is doing some chair exercise and wall push-ups.   Behavioral modification strategies: increasing lean protein intake, no meal skipping, meal planning , increase water intake, better snacking choices, planning for success, increasing vegetables, avoiding temptations, keep healthy foods in the home, increase frequency of journaling, weigh protein portions, measure portion sizes, and mindful eating.  Mindie has agreed to follow-up with our clinic in 4 weeks.    Objective:   VITALS: Per patient if applicable, see vitals. GENERAL: Alert and in no acute distress. CARDIOPULMONARY: No increased WOB. Speaking in clear sentences.  PSYCH: Pleasant and cooperative. Speech normal rate and rhythm. Affect  is appropriate. Insight and judgement are appropriate. Attention is focused, linear, and appropriate.  NEURO: Oriented as arrived to appointment on time with no prompting.   Attestation Statements:   This was prepared with the assistance of Engineer, Civil (consulting).  Occasional wrong-word or sound-a-like substitutions may have occurred due to the inherent limitations of voice recognition   Clayborne Daring, DO

## 2024-02-03 ENCOUNTER — Ambulatory Visit: Admitting: Bariatrics

## 2024-02-15 ENCOUNTER — Other Ambulatory Visit: Payer: Self-pay | Admitting: Family Medicine

## 2024-02-15 DIAGNOSIS — E559 Vitamin D deficiency, unspecified: Secondary | ICD-10-CM

## 2024-03-18 ENCOUNTER — Ambulatory Visit: Admitting: Family Medicine

## 2024-05-02 ENCOUNTER — Ambulatory Visit: Admitting: Physician Assistant

## 2024-08-31 ENCOUNTER — Ambulatory Visit
